# Patient Record
Sex: Male | Born: 1935
Health system: Southern US, Community
[De-identification: ages and names within clinical notes are randomized; demographics above are authoritative.]

## PROBLEM LIST (undated history)

## (undated) DIAGNOSIS — C801 Malignant (primary) neoplasm, unspecified: Secondary | ICD-10-CM

## (undated) DIAGNOSIS — E785 Hyperlipidemia, unspecified: Secondary | ICD-10-CM

## (undated) DIAGNOSIS — I10 Essential (primary) hypertension: Secondary | ICD-10-CM

## (undated) DIAGNOSIS — I219 Acute myocardial infarction, unspecified: Secondary | ICD-10-CM

## (undated) DIAGNOSIS — F419 Anxiety disorder, unspecified: Secondary | ICD-10-CM

## (undated) HISTORY — PX: CHOLECYSTECTOMY: SHX55

---

## 1978-10-07 HISTORY — PX: CARPAL TUNNEL RELEASE: SHX101

## 1997-02-05 DIAGNOSIS — I219 Acute myocardial infarction, unspecified: Secondary | ICD-10-CM

## 1997-02-05 HISTORY — PX: CORONARY ARTERY BYPASS GRAFT: SHX141

## 1997-02-05 HISTORY — DX: Acute myocardial infarction, unspecified: I21.9

## 1997-08-09 ENCOUNTER — Inpatient Hospital Stay (HOSPITAL_COMMUNITY): Admission: EM | Admit: 1997-08-09 | Discharge: 1997-08-18 | Payer: Self-pay | Admitting: Cardiology

## 1998-02-05 HISTORY — PX: JOINT REPLACEMENT: SHX530

## 1998-02-05 HISTORY — PX: HERNIA REPAIR: SHX51

## 2000-05-30 ENCOUNTER — Encounter: Payer: Self-pay | Admitting: Family Medicine

## 2000-05-30 ENCOUNTER — Ambulatory Visit (HOSPITAL_COMMUNITY): Admission: RE | Admit: 2000-05-30 | Discharge: 2000-05-30 | Payer: Self-pay | Admitting: Family Medicine

## 2001-09-19 ENCOUNTER — Encounter: Payer: Self-pay | Admitting: Family Medicine

## 2001-09-19 ENCOUNTER — Ambulatory Visit (HOSPITAL_COMMUNITY): Admission: RE | Admit: 2001-09-19 | Discharge: 2001-09-19 | Payer: Self-pay | Admitting: Family Medicine

## 2002-02-07 ENCOUNTER — Emergency Department (HOSPITAL_COMMUNITY): Admission: EM | Admit: 2002-02-07 | Discharge: 2002-02-07 | Payer: Self-pay | Admitting: Emergency Medicine

## 2002-02-10 ENCOUNTER — Encounter: Payer: Self-pay | Admitting: Urology

## 2002-02-10 ENCOUNTER — Ambulatory Visit (HOSPITAL_COMMUNITY): Admission: RE | Admit: 2002-02-10 | Discharge: 2002-02-10 | Payer: Self-pay | Admitting: Urology

## 2002-02-16 ENCOUNTER — Encounter: Payer: Self-pay | Admitting: Urology

## 2002-02-16 ENCOUNTER — Ambulatory Visit (HOSPITAL_COMMUNITY): Admission: RE | Admit: 2002-02-16 | Discharge: 2002-02-16 | Payer: Self-pay | Admitting: Urology

## 2002-06-19 ENCOUNTER — Ambulatory Visit (HOSPITAL_COMMUNITY): Admission: RE | Admit: 2002-06-19 | Discharge: 2002-06-19 | Payer: Self-pay | Admitting: General Surgery

## 2003-03-30 ENCOUNTER — Ambulatory Visit (HOSPITAL_COMMUNITY): Admission: RE | Admit: 2003-03-30 | Discharge: 2003-03-30 | Payer: Self-pay | Admitting: Urology

## 2003-03-30 ENCOUNTER — Encounter (INDEPENDENT_AMBULATORY_CARE_PROVIDER_SITE_OTHER): Payer: Self-pay | Admitting: *Deleted

## 2003-03-30 ENCOUNTER — Ambulatory Visit (HOSPITAL_BASED_OUTPATIENT_CLINIC_OR_DEPARTMENT_OTHER): Admission: RE | Admit: 2003-03-30 | Discharge: 2003-03-30 | Payer: Self-pay | Admitting: Urology

## 2004-06-02 ENCOUNTER — Ambulatory Visit (HOSPITAL_COMMUNITY): Admission: RE | Admit: 2004-06-02 | Discharge: 2004-06-02 | Payer: Self-pay | Admitting: Family Medicine

## 2004-11-21 ENCOUNTER — Ambulatory Visit: Payer: Self-pay | Admitting: Oncology

## 2007-07-04 ENCOUNTER — Encounter: Payer: Self-pay | Admitting: Pulmonary Disease

## 2007-07-04 ENCOUNTER — Inpatient Hospital Stay (HOSPITAL_COMMUNITY): Admission: EM | Admit: 2007-07-04 | Discharge: 2007-07-08 | Payer: Self-pay | Admitting: Emergency Medicine

## 2007-07-04 ENCOUNTER — Ambulatory Visit: Payer: Self-pay | Admitting: Cardiology

## 2007-07-07 ENCOUNTER — Encounter: Payer: Self-pay | Admitting: Cardiology

## 2007-07-07 ENCOUNTER — Ambulatory Visit: Payer: Self-pay | Admitting: *Deleted

## 2007-07-07 ENCOUNTER — Ambulatory Visit: Payer: Self-pay | Admitting: Cardiology

## 2007-07-08 ENCOUNTER — Other Ambulatory Visit: Payer: Self-pay | Admitting: Internal Medicine

## 2007-07-28 DIAGNOSIS — F528 Other sexual dysfunction not due to a substance or known physiological condition: Secondary | ICD-10-CM | POA: Insufficient documentation

## 2007-07-28 DIAGNOSIS — I2510 Atherosclerotic heart disease of native coronary artery without angina pectoris: Secondary | ICD-10-CM | POA: Insufficient documentation

## 2007-07-28 DIAGNOSIS — I10 Essential (primary) hypertension: Secondary | ICD-10-CM | POA: Insufficient documentation

## 2007-07-28 DIAGNOSIS — E785 Hyperlipidemia, unspecified: Secondary | ICD-10-CM | POA: Insufficient documentation

## 2007-07-28 DIAGNOSIS — F411 Generalized anxiety disorder: Secondary | ICD-10-CM | POA: Insufficient documentation

## 2007-07-29 ENCOUNTER — Ambulatory Visit: Payer: Self-pay | Admitting: Pulmonary Disease

## 2007-07-29 DIAGNOSIS — J439 Emphysema, unspecified: Secondary | ICD-10-CM | POA: Insufficient documentation

## 2007-08-20 ENCOUNTER — Telehealth: Payer: Self-pay | Admitting: Pulmonary Disease

## 2007-08-20 ENCOUNTER — Ambulatory Visit: Payer: Self-pay | Admitting: Pulmonary Disease

## 2008-02-20 ENCOUNTER — Ambulatory Visit: Payer: Self-pay | Admitting: Pulmonary Disease

## 2008-02-24 ENCOUNTER — Ambulatory Visit (HOSPITAL_COMMUNITY): Admission: RE | Admit: 2008-02-24 | Discharge: 2008-02-24 | Payer: Self-pay | Admitting: Pulmonary Disease

## 2008-09-24 ENCOUNTER — Ambulatory Visit (HOSPITAL_COMMUNITY): Admission: RE | Admit: 2008-09-24 | Discharge: 2008-09-24 | Payer: Self-pay | Admitting: Family Medicine

## 2008-09-24 ENCOUNTER — Encounter: Payer: Self-pay | Admitting: Pulmonary Disease

## 2009-04-22 ENCOUNTER — Ambulatory Visit (HOSPITAL_COMMUNITY): Admission: RE | Admit: 2009-04-22 | Discharge: 2009-04-22 | Payer: Self-pay | Admitting: Family Medicine

## 2010-02-26 ENCOUNTER — Encounter: Payer: Self-pay | Admitting: Family Medicine

## 2010-03-07 NOTE — Assessment & Plan Note (Signed)
Summary: consult for emphysema and congestion   PCP:  Dr. Rudi Heap  Chief Complaint:  Pulmonary consult.  Pt c/o sob with exertion and hoarseness.  Meds reviewed.  Pt recently in [facility] for sob.[name]  History of Present Illness: the patient is a 77 year old male who I have been asked to see for COPD and congestion.  Patient states that he has had chronic congestion for many years, and is currently using Mucinex in order to try and improve his situation.  He has an extensive smoking history, but has quit over the last one month.  He continues to use smokeless tobacco.  He has had recent catheterization for chest pain and dyspnea with no intervention being required.  The patient feels that mucus is getting stuck in his upper chest and he is unable to mobilize this.  He has been on Advair in the past which caused hoarseness.  He does not think he has postnasal drip, and denies reflux symptoms.  He does not have a chronic cough since being off cigarettes, and produces very little mucus.  He does have dyspnea on exertion at one block at a moderate pace, and will get short of breath bringing groceries in from the car.  The patient also has had a CT of his chest during his last hospitalization, and this showed both calcified and noncalcified nodules of unknown etiology.     Current Allergies: No known allergies   Past Medical History:    Current Problems:     HYPERLIPIDEMIA (ICD-272.4)    ERECTILE DYSFUNCTION (ICD-302.72)    ANXIETY (ICD-300.00)    HYPERTENSION (ICD-401.9)    CORONARY ARTERY DISEASE (ICD-414.00)-status post CABG 1999    history of myocardial infarction       Past Surgical History:    heart bypass==1999    Umbilical hernia repair    Right shoulder surgery in 1997?   Family History:    7 bothers and 4 sisters with heart disease.    Pancreatic cancer-- mother    Stomach cancer-- gather  Social History:    Retired Scientist, product/process development.    Patient states former smoker.  Pt quit x1  month.    Living Situation:  lives alone   Risk Factors:  Tobacco use:  quit    Year started:  age 33    Year quit:  2009    Pack-years:  67 years x2 ppd Alcohol use:  no   Review of Systems      See HPI   Vital Signs:  Patient Profile:   77 Years Old Male Weight:      145 pounds O2 Sat:      96 % O2 treatment:    Room Air Temp:     97.6 degrees F oral Pulse rate:   71 / minute BP sitting:   112 / 70  (left arm) Cuff size:   regular  Vitals Entered By: Michel Bickers CMA (July 29, 2007 11:56 AM)                 Physical Exam  General:     thin male in no acute distress Eyes:     PERRLA and EOMI.   Nose:     deviated septum to the left with nasal obstruction Mouth:     mild elongation of the soft palate and uvula Neck:     no JVD, thyromegaly, or lymphadenopathy. Lungs:     mildly decreased breath sounds, no wheezes or rhonchi Heart:  regular rate and rhythm, distant heart sounds Abdomen:     soft and nontender, bowel sounds present Extremities:     no significant edema, pulses intact distally Neurologic:     alert and oriented, moves all 4 extremities.     Impression & Recommendations:  Problem # 1:  COUGH (ICD-786.2) the cough and congestion the patient is describing sounds more upper airway and lower airway.  He is pointing to the center of his chest, but states that it is causing hoarseness.  He is also clearing his throat a lot during our visit.  It is unclear whether this is post nasal drip from allergic rhinitis, or possibly laryngopharyngeal reflux.  Will go ahead and treat this as postnasal drip, but will also intensify his bronchodilator regimen as well  Problem # 2:  EMPHYSEMA (ICD-492.8) I suspect the patient does have emphysema on clinical grounds.  I would like to start Spiriva, and order pulmonary function test for verification.  He will follow-up with me the same day as his PFTs.  Problem # 3:  PULMONARY NODULE (ICD-518.89) the  patient was found to have calcified and noncalcified nodules by chest CT recently while in the hospital.  Because of his extensive smoking history, the noncalcified nodules will need follow-up.  I would recommend a follow-up scan in 6 months.  Medications Added to Medication List This Visit: 1)  Adult Aspirin Ec Low Strength 81 Mg Tbec (Aspirin) .[name].. 1 by mouth daily 2)  Lipitor 10 Mg Tabs (Atorvastatin calcium) .[name].. 1 by mouth daily 3)  Cardura 2 Mg Tabs (Doxazosin mesylate) .[name].. 1 by mouth daily 4)  Klonopin 1 Mg Tabs (Clonazepam) .[name].. 1 by mouth three times a day   Patient Instructions: 1)  start spiriva one each am 2)  use albuterol inhaler (red one) only if an emergency 3)  get zyrtec otc and take one at bedtime 4)  see me in 3 weeks, and will have breathing tests same day   ]

## 2010-03-07 NOTE — Miscellaneous (Signed)
Summary: Orders Update pft charges  Clinical Lists Changes  Orders: Added new Service order of Carbon Monoxide diffusing w/capacity (94720) - Signed Added new Service order of Lung Volumes (94240) - Signed Added new Service order of Spirometry (Pre & Post) (94060) - Signed 

## 2010-03-07 NOTE — Progress Notes (Signed)
Summary: rx  Phone Note Call from Patient Call back at Home Phone [phone]   Caller: Patient Call For: Darrah Dredge Reason for Call: Refill Medication, Talk to Nurse Summary of Call: Spiriva - Forgot to get samples or rx...needs rx called in [facility] Initial call taken by: Eugene Gavia,  August 20, 2007 3:11 PM  Follow-up for Phone Call        called and spoke with pt.  he is aware that the rx for spiriva has been sent to his pharmacy Follow-up by: Marijo File CMA,  August 20, 2007 3:17 PM    New/Updated Medications: SPIRIVA HANDIHALER 18 MCG  CAPS (TIOTROPIUM BROMIDE MONOHYDRATE) once daily   Prescriptions: SPIRIVA HANDIHALER 18 MCG  CAPS (TIOTROPIUM BROMIDE MONOHYDRATE) once daily  #1 x 6   Entered by:   Marijo File CMA   Authorized by:   Barbaraann Share MD   Signed by:   Marijo File CMA on 08/20/2007   Method used:   Electronically sent to ...       [id number]       [address] [place], [place]  [address]       Ph: [phone] or [phone]       Fax: [phone]   RxID:   [id number]

## 2010-03-07 NOTE — Assessment & Plan Note (Signed)
Summary: rov for emphysema   Visit Type:  Follow-up PCP:  Dr. Rudi Heap  Chief Complaint:  3 wk followup with PFT's.  He states his breathing has improved some since starting spiriva.  He denies any complaints today.[name]  History of Present Illness: the patient comes in today for followup after his recent PFTs, and also to assess his progress on Spiriva. The patient states that his breathing has improved somewhat since being on the Spiriva. His pulmonary function studies today shows moderate to severe airflow obstruction, with no improvement with bronchodilators. There was no restriction, and his diffusion capacity appeared normal. I have gone over the studies in great detail with the patient, and have answered all of his questions. At the last visit, the patient was complaining of a cough with tickling in his throat. Since being off the Advair, this has resolved     Current Allergies: No known allergies     Risk Factors:  Tobacco use:  quit    Year quit:  2009    Pack-years:  65 years x2 ppd    Comments:  Pt uses smokeless tobacco daily    Vital Signs:  Patient Profile:   77 Years Old Male Height:     67 inches Weight:      146 pounds O2 Sat:      95 % O2 treatment:    Room Air Temp:     97.7 degrees F oral Pulse rate:   86 / minute BP sitting:   128 / 68  (left arm)  Vitals Entered By: Vernie Murders (August 20, 2007 1:25 PM)                 Physical Exam  General:     thin male in no acute distress   Pulmonary Function Test Date: 08/20/2007 Height (in.): 67 Gender: Male  Pre-Spirometry FVC    Value: 3.56 L/min   Pred: 3.75 L/min     % Pred: 95 % FEV1    Value: 1.66 L     Pred: 2.44 L     % Pred: 68 % FEV1/FVC  Value: 47 %     Pred: 68 %     % Pred: - % FEF 25-75  Value: 0.38 L/min   Pred: 2.20 L/min     % Pred: 17 %  Post-Spirometry FVC    Value: 3.47 L/min   Pred: 3.75 L/min     % Pred: 93 % FEV1    Value: 1.74 L     Pred: 2.44 L     % Pred: 71 %  FEV1/FVC  Value: 50 %     Pred: 68 %     % Pred: - % FEF 25-75  Value: 0.48 L/min   Pred: 2.20 L/min     % Pred: 22 %  Lung Volumes TLC    Value: 6.01 L   % Pred: 106 % RV    Value: 2.46 L   % Pred: 100 % DLCO    Value: 15.6 %   % Pred: 98 % DLCO/VA  Value: 3.21 %   % Pred: 95 %     Impression & Recommendations:  Problem # 1:  EMPHYSEMA (ICD-492.8) The pt has moderate to severe emphysema on his pft's.  He is actually functioning at a much higher level than his pft's indicate.  He is tolerating the spiriva well, with no hoarseness issues.I would not intensify his therapy since he seems to be doing okay. If  his condition worsens, we can always add symbicort in the future.  Problem # 2:  COUGH (ICD-786.2) I suspect a lot of his cough was due to to upper airway irritation associated with the Advair discus. The patient states it has resolved.  Problem # 3:  PULMONARY NODULE (ICD-518.89) Will schedule a followup CT scan for surveillance at the next visit in December  Complete Medication List: 1)  Adult Aspirin Ec Low Strength 81 Mg Tbec (Aspirin) .[name].. 1 by mouth daily 2)  Lipitor 10 Mg Tabs (Atorvastatin calcium) .[name].. 1 by mouth daily 3)  Cardura 2 Mg Tabs (Doxazosin mesylate) .[name].. 1 by mouth daily 4)  Klonopin 1 Mg Tabs (Clonazepam) .[name].. 1 by mouth three times a day 5)  Spiriva Handihaler 18 Mcg Caps (Tiotropium bromide monohydrate) .... Once daily 6)  Proair Hfa 108 (90 Base) Mcg/act Aers (Albuterol sulfate) .... 2 puffs every 4 hours as needed   Patient Instructions: 1)  Please schedule a follow-up appointment in 6 months. 2)  no change in meds.   ]

## 2010-06-20 NOTE — Group Therapy Note (Signed)
NAME:  Steven [name], MANCERA                 ACCOUNT NO.:  [id number]   MEDICAL RECORD NO.:  [id number]          PATIENT TYPE:  INP   LOCATION:  A316                          FACILITY:  [facility]   PHYSICIAN:  Dorris Singh, DO    DATE OF BIRTH:  1928/07/24   DATE OF PROCEDURE:  DATE OF DISCHARGE:                                 PROGRESS NOTE   The patient is seen today resting in bed.  States that he feels a lot  better.  His breathing is improved.  Will go ahead and continue to  monitor him.   His temperature is 97.8, pulse is 87, respirations 22, blood pressure  100/45.  GENERAL:  The patient is a 77 year old man who looks older than his  stated age but is in no acute distress.  HEART:  Regular rate and rhythm.  LUNGS:  Minimal movement of air, decreased breath sounds throughout lung  fields.  ABDOMEN:  Soft, nontender, nondistended.  EXTREMITIES:  Positive pulses.  No edema, ecchymosis or cyanosis.   LABS:  Today his white count was 11.9, hemoglobin of 12.1, hematocrit of  34.3, and a platelet count of 153.  His sodium was 134, potassium 3.8,  chloride 101, CO2 23, glucose 161, BUN 21, creatinine 1.52.   ASSESSMENT/PLAN:  1. Acute exacerbation of chronic obstructive pulmonary disease.  Will      go ahead and continue with the current treatment therapy and      continue to monitor the patient.  2. Increasing creatinine.  Will go ahead and start the patient on      cautious IV hydration.  3. Hyponatremia.  Will also use IV hydration to correct that as well.  4. The patient also has elevated troponin.  Will get an EKG to see if      there are any changes and will continue to monitor his troponins.      Currently the patient is not complaining of any chest pain,      shortness of breath or diaphoresis.  He has a heart historyof a      coronary artery bypass graft in the past, so we will go ahead and      continue to monitor his troponins and do appropriate therapy and      change  management if necessary.      Dorris Singh, DO  Electronically Signed     CB/MEDQ  D:  07/05/2007  T:  07/05/2007  Job:  [id number]

## 2010-06-20 NOTE — Group Therapy Note (Signed)
NAME:  Steven [name], Steven [name]                 ACCOUNT NO.:  [id number]   MEDICAL RECORD NO.:  [id number]          PATIENT TYPE:  INP   LOCATION:  A316                          FACILITY:  [facility]   PHYSICIAN:  Lucita Ferrara, MD         DATE OF BIRTH:  [birth year]   DATE OF PROCEDURE:  07/06/2007  DATE OF DISCHARGE:                                 PROGRESS NOTE   The patient examined by bedside today.  He is comfortable, denies any  chest pain, shortness of breath.  He was admitted on Jul 04, 2007,  presenting with symptoms of cough and shortness of breath.  He was  admitted with acute COPD exacerbation, started on nebulizer treatments  and empirically started on Solu-Medrol and Levaquin, symptoms are much  improved, he is still quite a bit anxious.   OBJECTIVE:  Temperature 97.3, pulse 88, respirations 20, blood pressure  96/54.  HEENT:  Normocephalic, atraumatic.  CARDIOVASCULAR:  S1-S2 regular rate, rhythm.  ABDOMEN:  Soft, nontender, nondistended, positive bowel sounds.  LUNGS:  Diffuse expiratory wheezes moderate.  EXTREMITIES:  No clubbing, cyanosis or edema.  NEURO:  Patient is anxious yet alert and oriented x3.   LABORATORY DATA:  Urine culture negative;  cardiac enzymes troponin 0.7,  0.08, 0.09.  Urine microscopy shows trace leukocyte esterase.  Blood  gases done on the 30th shows pH of 7.398/pCO2 of 36.7.  His EKG shows  him to have nonspecific ST-T wave abnormalities.   ASSESSMENT/PLAN:  1. Acute chronic obstructive pulmonary disease exacerbation, currently      on nebulizer treatments, Solu-Medrol and Levaquin as needed, seems      to be controlled.  Will continue to monitor and clinically titrate      him down.  2. Admitted with hyponatremia which is resolved so far with IV      hydration.  3. Mildly elevated troponin.  His EKG shows nonspecific changes.  He      is not complaining of any chest pain.  4. History of coronary artery bypass graft.  Will monitor his      troponins  currently and if they trend up will go ahead and consult      cardiology, continue risk factor modification, currently he is on      aspirin which we will continue.  He is on simvastatin, deep vein      thrombosis prophylaxis with Lovenox, GI prophylaxis with Protonix.  5. Diabetes type 2.  Continue his sliding scale insulin.  6. Anxiety.  Currently on clonazepam.  He is on clonazepam, continue      current dose.      Lucita Ferrara, MD  Electronically Signed    RR/MEDQ  D:  07/06/2007  T:  07/06/2007  Job:  [id number]

## 2010-06-20 NOTE — Consult Note (Signed)
NAME:  Steven [name], Steven [name]                 ACCOUNT NO.:  [id number]   MEDICAL RECORD NO.:  [id number]          PATIENT TYPE:  INP   LOCATION:  A316                          FACILITY:  [facility]   PHYSICIAN:  Gerrit Friends. Dietrich Pates, MD, FACCDATE OF BIRTH:  [birth year]   DATE OF CONSULTATION:  07/07/2007  DATE OF DISCHARGE:                                 CONSULTATION   CARDIOLOGIST:  He is new to Dr. Dietrich Pates, he has previously been  followed by Dr. Antoine Poche in [place], with his last visit being in 2002.   PRIMARY CARE Jeslyn Amsler:  Denyce Robert. Long, PA-C with Ernestina Penna, MD   REFERRING PHYSICIAN:  Lucita Ferrara, MD of [facility] Hospitalist team P.   REASON FOR REFERRAL:  Dyspnea and elevated troponins.   HISTORY OF PRESENT ILLNESS:  Mr. Steven [name] is a 77 year old male patient  with a history of CAD, status post CABG by Dr. Cornelius Moras in 1999, after  suffering an apparent myocardial infarction.  He was last seen by Dr.  Antoine Poche in the [place] office in 2002.  There was a question of an  abnormal treadmill stress study.  Dr. Antoine Poche reviewed this, and felt  that the results were not abnormal.  He was continued on medical  therapy.  I have no records that I have seen this patient since that  time.  He was apparently in his usual state of health until Friday  morning, Jul 04, 2007, at around 4 o'clock.  At that time, he developed  an acute onset of dyspnea.  He had no associated chest discomfort, arm  or jaw pain, nausea, or diaphoresis.  He did not feel presyncopal or  have syncope.  He denies any recent history of worsening dyspnea with  exertion.  He does have baseline dyspnea with exertion secondary to his  COPD.  He describes NYHA class II symptoms.  He denies orthopnea, PND,  or pedal edema.  Since admission, he has been treated for COPD  exacerbation.  He notes hoarseness with breathing treatments.  Other  than that, he denies any recurrent shortness of breath.  His CK-MBs and  troponins have been  minimally elevated and we were asked to further  evaluate.   PAST MEDICAL HISTORY:  1. CAD, status post CABG in 1999.      a.     Grafts:  LIMA to the LAD, vein graft to the PDA, vein graft       to the distal RCA and PDA, vein graft to circumflex, and vein       graft to the first diagonal.  2. Hypertension.  3. Hyperlipidemia.  4. COPD.  5. BPH.  6. History of carotid stenosis.      a.     Apparently, evaluated by vascular surgery in the past.  7. Anxiety.  8. Status post cholecystectomy.  9. Status post umbilical herniorrhaphy.  10.Status post right rotator cuff surgery.  11.Status post bilateral carpal tunnel release.   MEDICATIONS PRIOR TO ADMISSION:  1. Lipitor 10 mg daily.  2. Cardura 2 mg daily.  3. Klonopin 1 mg three  times a day.  4. Aspirin 81 mg daily.   ALLERGIES:  No known drug allergies.   SOCIAL HISTORY:  The patient lives in [place] by himself.  He is retired  from [employer].  He has a 65 plus pack year history of smoking and  continues to smoke a half pack of cigarettes per day.  He denies alcohol  abuse.   FAMILY HISTORY:  Significant for premature CAD in multiple family  members.   REVIEW OF SYSTEMS:  Please see HPI.  He denies any fevers, chills,  headaches, or cough.  He denies dysuria, hematuria, bright red blood per  rectum, or melena.  Denies any monocular blindness, unilateral weakness,  difficulty in speech or facial droop.  He does note a tired feeling in  his legs with walking.  Rest of the review of systems are negative.   PHYSICAL EXAMINATION:  He is a well-nourished and well-developed male,  in no distress.  Blood pressure 125/61, pulse 79, respirations 20, temperature 97.6, and  oxygen saturation 96% on 2 L.  HEENT:  Normal.  NECK:  Without JVD without lymphadenopathy.  ENDOCRINE:  Without thyromegaly.  CARDIAC:  Normal S1 and S2.  Regular rate and rhythm with a 2/6 systolic  ejection murmur heard best at right upper sternal border.   LUNGS:  With  decreased breath sounds bilaterally.  Faint bibasilar crackles.  SKIN:  Without rash.  ABDOMEN:  Soft and nontender with normoactive bowel sounds.  No  organomegaly.  EXTREMITIES:  Without clubbing, cyanosis, or edema.  MUSCULOSKELETAL:  Without joint deformity.  NEUROLOGIC:  He is alert and oriented x3.  Cranial nerves II through XII  are grossly intact.  VASCULAR:  Positive bilateral carotid bruits noted.  Femoral artery  pulses are 2+ bilaterally with positive bruits bilaterally.  His  dorsalis pedis and posterior tibialis pulses are difficult to palpate  bilaterally.   Chest x-ray reveals probable COPD, prior CABG, no acute changes.  Chest  CT revealed no pulmonary embolism, calcified and noncalcified pulmonary  nodules-question granulomatous disease.  Followup recommended in 6  months.  EKG reveals normal sinus rhythm, heart rate is 78, Q-waves in  II, III, and aVF, and T-wave inversions in II, III, and aVF and V4  through V6, T-wave inversions seen to be new since prior tracing dated  Jul 05, 2007.   LABORATORY DATA:  White count 9700, hemoglobin 11.2, hematocrit 32.5,  and platelet count 147,000.  Sodium 142, potassium 4.6, BUN 24,  creatinine 1.24, which is down from 1.52, and glucose 139.  D-dimer 1.6,  BNP 252, albumin 2.7, and INR 1.0.  Cardiac enzymes as follows:  CK #1  is 67, #2 is 102, #3 is 251, and #4 is 221.  CK-MB #1 is 3, #2 is 4.8,  #3 is 8.4, and #4 is 7.7.  Troponin I #1 is 0.04, #2 is 0.07, #3 is  0.08, and #4 is 0.07.   IMPRESSION:  1. Acute dyspnea.      a.     Rule out non-ST elevation myocardial infarction.      b.     Rule out chronic obstructive pulmonary disease exacerbation.  2. Coronary artery disease, status post coronary artery bypass graft.  3. Bilateral carotid artery bruits.  4. Bilateral lower extremity claudication with evidence of peripheral      arterial disease on exam.  5. Systolic murmur.  6. Mild renal  insufficiency.   PLAN:  The patient presents with acute onset of  dyspnea.  This is unlike  his anxiety attacks.  It is also unlike his prior angina.  He denies any  purulent cough.  His EKG has T-wave inversions in inferolateral leads.  His cardiac enzymes have been minimally elevated.  Given his abnormal  findings, he may ultimately require cardiac catheterization.  At this  point in time, we will check an echocardiogram to assess his LV  function, wall motion, and valvular status.  Carotid Dopplers will also  be checked as well as ABIs.  His Lovenox, aspirin, and simvastatin will  be continued.  Lopressor will be initiated, and his Cardura will be  discontinued.   Thank you very much for the consultation.  We will be glad to follow up  the patient throughout this admission.      Tereso Newcomer, PA-C      Gerrit Friends. Dietrich Pates, MD, [facility]  Electronically Signed    SW/MEDQ  D:  07/07/2007  T:  07/07/2007  Job:  [id number]   cc:   Lorin Picket D. Long, PA-C   Ernestina Penna, M.D.  Fax: [phone]   Lucita Ferrara, MD

## 2010-06-20 NOTE — Group Therapy Note (Signed)
NAME:  Steven [name], Steven [name]                 ACCOUNT NO.:  [id number]   MEDICAL RECORD NO.:  [id number]          PATIENT TYPE:  INP   LOCATION:  A316                          FACILITY:  [facility]   PHYSICIAN:  Margaretmary Dys, M.D.DATE OF BIRTH:  [birth year]   DATE OF PROCEDURE:  07/07/2007  DATE OF DISCHARGE:                                 PROGRESS NOTE   SUBJECTIVE:  When I arrived to see the patient today, I was informed  that he was having nonsustained ventricular tachycardia.  It was also  noted that his cardiac enzymes were borderline elevated.  As a result,  the patient was seen by Dr. Dietrich Pates, the cardiologist who recommended  transfer to [facility] for cardiac catheterization.  The  patient was subsequently transferred to [facility] in a stable  condition.      Margaretmary Dys, M.D.  Electronically Signed     AM/MEDQ  D:  07/07/2007  T:  07/07/2007  Job:  [id number]

## 2010-06-20 NOTE — Discharge Summary (Signed)
NAME:  Steven [name], Steven [name]                 ACCOUNT NO.:  [id number]   MEDICAL RECORD NO.:  [id number]          PATIENT TYPE:  INP   LOCATION:  3735                         FACILITY:  [facility]   PHYSICIAN:  Everardo Beals. Juanda Chance, MD, FACCDATE OF BIRTH:  02-11-1928   DATE OF ADMISSION:  07/07/2007  DATE OF DISCHARGE:  07/08/2007                               DISCHARGE SUMMARY   PRIMARY CARDIOLOGIST:  Dr. Rollene Rotunda in [place].   PRIMARY CARE Clara Herbison:  Lindaann Pascal, physician assistant.   PROCEDURES PERFORMED DURING HOSPITALIZATION:  Cardiac catheterization  completed by Dr. Charlies Constable on July 08, 2007, revealing a total LAD,  total circumflexafter OM, 90% OM, right coronary artery 90% mid with  total distal;  SVG to OM patent, SVG to diagonal patent, SVG to PDA and  PL patent, LIMA to LAD patent, LV systolic function 60%.   FINAL DISCHARGE DIAGNOSES:  1. Coronary artery disease.      a.     Status post coronary artery bypass grafting.  2. Chronic obstructive pulmonary disease.  3. History of MI.  4. History of panic attacks.  5. Ongoing tobacco abuse.   HOSPITAL COURSE:  A 77 year old Caucasian male who was transferred from  [facility] with complaints of shortness of breath and chest  discomfort.  The patient was admitted and had cardiac catheterization  completed, catheterization as described as above and having patent  grafts.  The patient continues to smoke.  The patient was given  reassurance during hospitalization and his coronary anatomy was healthy  concerning his grafts.  He has been advised strongly to quit smoking.  He has also been advised to follow with primary care and have followup  with a pulmonologist concerning his COPD.   LABS ON DISCHARGE:  Sodium 140, potassium 4.3, chloride 105, CO2 28,  glucose 135, BUN 26, creatinine 1.25, cholesterol 154, triglycerides 83,  HDL 65, LDL 72, hemoglobin 12.6, hematocrit 37.0, white blood cells 8.4,  platelets 168.  ABG, pH  7.413, pCO2 47.1, pCO2 56.0, bicarbonate 30, O2  sat 89%.  CT of the chest completed at [facility] revealing status post  CABG, aortic normal caliber without aneurysm or dissection.  No evidence  of pulmonary embolism or acute thoracic process.   DISCHARGE VITAL SIGNS:  Blood pressure 120/65, heart rate 74,  respirations 20, and temperature 97.7.   DISCHARGE MEDICATIONS:  1. Lipitor 10 mg at bedtime.  2. Cardura 2 mg daily.  3. Aspirin 81 mg daily.  4. Klonopin 1 mg 3 times a day.  5. Combivent inhalers 4 times a day.  6. Nitroglycerin sublingual p.r.n. chest discomfort.   ALLERGIES:  NO KNOWN DRUG ALLERGIES.   FOLLOWUP PLANS AND APPOINTMENT:  1. The patient is to follow with Lindaann Pascal, physician assistant, for      continued outpatient medical management.  2. It is recommended that the patient will be followed by      pulmonologist secondary to ongoing COPD and tobacco abuse.  3. The patient is to follow up with Dr. Antoine Poche in [place] office for  continued cardiology management within the next 6 months.  4. The patient is strongly urged to quit smoking.   TIME SPENT WITH THE PATIENT TO INCLUDE PHYSICIAN TIME:  30 minutes.      Bettey Mare. Lyman Bishop, NP      Everardo Beals. Juanda Chance, MD, [facility]  Electronically Signed    KML/MEDQ  D:  07/08/2007  T:  07/09/2007  Job:  [id number]   cc:   Lindaann Pascal, PA-C

## 2010-06-20 NOTE — H&P (Signed)
NAME:  Steven [name], NASTASI NO.:  [id number]   MEDICAL RECORD NO.:  [id number]          PATIENT TYPE:  EMS   LOCATION:  ED                            FACILITY:  [facility]   PHYSICIAN:  Dorris Singh, DO    DATE OF BIRTH:  06-Jul-1928   DATE OF ADMISSION:  07/04/2007  DATE OF DISCHARGE:  LH                              HISTORY & PHYSICAL   The patient is a 77 year old Caucasian male who presented to the [facility] emergency room with a complaint of shortness of breath.  He states  that for the last couple days, he has noticed it has gotten  progressively worse.  He has been treating himself for was has felt like  hoarseness over the last couple days without any relief.  He does not  admit to having any cough, but just states that his breathing has  deteriorated.  Also, the patient smokes about half-a-pack admitted and  has been smoking for about 65 years and is still smoking with COPD.   PAST MEDICAL HISTORY:  1. Significant for COPD.  2. Hernia repair.  3. Right shoulder repair.  4. Myocardial infarction.  5. History of panic attacks.   FAMILY HISTORY:  Significant for hypertension.   PAST SURGICAL HISTORY:  The patient admits to several surgeries:  1. He has had a cholecystectomy.  2. Right shoulder surgery.  3. CABG.  4. Bilateral carpal tunnel release.   SOCIAL HISTORY:  Currently, he is a widow.  He lives alone.  He does not  drink.  He does not use any drugs, but smokes about half-pack a day.   ALLERGIES:  HE HAS NO KNOWN DRUG ALLERGIES.   CURRENT MEDICATIONS:  We have several medications without doses;  Lipitor, Cardura, aspirin, Klonopin and Combivent inhalers.   PRIMARY CARE PHYSICIAN:  Dr. Rudi Heap, [place].   REVIEW OF SYSTEMS:  CONSTITUTIONAL:  Negative for weight loss, but  positive for weakness.  EYES:  Negative for eye pain or changes in  vision.  EARS/NOSE/MOUTH/THROAT:  Negative for ear pain or hearing loss.  Positive for hoarseness.   Negative for sinus pressure.  Positive for  sore throat.  CARDIOVASCULAR:  Negative for chest pain, palpitations or  bradycardia.  RESPIRATORY:  Positive for dyspnea and wheezing.  Negative  for cough.  GASTROINTESTINAL:  Negative for nausea, vomiting, diarrhea  or abdominal pain.  GU:  Negative for dysuria, urgency or dribbling.  MUSCULOSKELETAL:  Negative for arthralgias, arthritis or back pain.  SKIN:  Negative for pruritus or rash.  NEURO:  Negative for headache,  confusion or altered mental status.  METABOLIC:  Negative for excessive  thirst, heat or cold intolerance.  HEMATOLOGIC:  Negative for anemia.   PHYSICAL EXAMINATION:  VITAL SIGNS:  Blood pressure is 114/63, pulse  rate 119.  His temperature is 98.8.  He is currently on pulse oximeter  at 100% on 2 liters.  CONSTITUTIONAL:  The patient is a 77 year old male who is well-  developed, well-nourished, well-hydrated, answers questions  appropriately.  HEENT:  Head is normocephalic, atraumatic.  Eyes are  equal and reactive  to light bilaterally.  There is no scleral icterus or conjunctival  injection.  PMIs are visualized bilaterally.  Nose, turbinates are moist  with nasal cannula.  Throat, teeth are in poor repair.  There is no  erythema or exudate noted.  NECK:  Supple.  No thyromegaly noted.  There is no carotid bruits  either.  CARDIOVASCULAR:  Regular tachy sinus.  No murmur noted.  RESPIRATORY:  Decreased breath sounds, prolonged expirations.  There is  some wheezing throughout bilateral lung fields.  CHEST:  Symmetrical.  There are no retractions noted.  ABDOMEN:  Soft, nontender, nondistended.  No guarding, no rebound or  tenderness.  Bowel sounds are present in all four quadrants.  EXTREMITIES:  Positive pulses.  No ecchymosis, edema or cyanosis noted.  Full range of motion.  NEURO:  Cranial nerves II-XII grossly intact.  The patient has intact motor capabilities in all four extremities.  He  is A&O x3 and  appropriate.  SKIN:  Has good texture.  No rash noted.   The patient had an EKG done.  His heart rate was 83 beats per minute,  normal sinus.  Everything is normal limits, which was unchanged from his  last EKG in March 26, 2003.  His BNP was 35.  His chest x-ray showed  probable COPD, prior CABG, no acute thoracic findings.  First set  cardiac enzymes were negative.  Sodium was 139, potassium 3.8, chloride  105, carbon dioxide 28, glucose 152, BUN 14, creatinine 1.12.  D-dimer  of 1.60.  White count of 6.8, hemoglobin of 13.4, hematocrit 38.5 and  platelet count of 158.  His ABG: pH of 7.379, pCO2 of 44.9, pCO2 of  69.6, bicarb 25.9.  His CT of his chest demonstrated no evidence of  pulmonary embolus or acute thoracic process.  Combination calcified and  noncalcified pulmonary nodule may represent prior granulomatous disease  due to noncalcified pulmonary nodule.  Follow-up CT is recommended in 6  months to assess stability.   PLAN:  1. Will admit the patient to the service of [facility] to 3A with      telemetry.  Place him on a heart-healthy diet.  Will get labs,      including cardiac enzymes as well.  Will place him on Solu-Medrol      IV, DuoNeb q.4 h. with p.r.n., one hour p.r.n.  Place him on DVT      and GI prophylaxis.  Will put the patient on his home medication      once dosages are obtained.  Smoking education.  Will continue to      monitor and change therapy as necessary.      Dorris Singh, DO  Electronically Signed     CB/MEDQ  D:  07/04/2007  T:  07/04/2007  Job:  [id number]

## 2010-06-23 NOTE — H&P (Signed)
NAME:  CALYB, MCQUARRIE NO.:  [id number]   MEDICAL RECORD NO.:  [id number]                  PATIENT TYPE:   LOCATION:                                       FACILITY:   PHYSICIAN:  Dalia Heading, M.D.               DATE OF BIRTH:  03/13/1928   DATE OF ADMISSION:  06/19/2002  DATE OF DISCHARGE:                                HISTORY & PHYSICAL   CHIEF COMPLAINT:  Incisional hernia.   HISTORY OF PRESENT ILLNESS:  The patient is a 77 year old white male, status  post a laparoscopic cholecystectomy for gallstone pancreatitis in 2000, who  now presents with worsening umbilical hernia.  He states he has had it in  the past, though it has increased in size recently.  It is around a surgical  site in the periumbilical region.  No nausea or vomiting had been noted.  Increasing tenderness is noted.   PAST MEDICAL HISTORY:  1. Coronary artery disease.  2. Hypertension.  3. Anxiety disorder.  4. High cholesterol levels.   PAST SURGICAL HISTORY:  1. As noted above.  2. Hand surgery x 2.  3. CABG.  4. Shoulder surgery.   CURRENT MEDICATIONS:  1. Aspirin, which he is holding.  2. Cardura 2 mg p.o. daily.  3. Lipitor.  4. Paxil.  5. Klonopin on occasion.   ALLERGIES:  No known drug allergies.   REVIEW OF SYSTEMS:  The patient continues to smoke daily.  Denies any recent  chest pain, MI, CVA, or diabetes mellitus.  Denies any recent bleeding  disorders.   PHYSICAL EXAMINATION:  GENERAL:  Well-developed, well-nourished white male  in no acute distress.  VITAL SIGNS:  Afebrile, and vital signs are stable.  LUNGS:  Clear to auscultation with equal breath sounds bilaterally.  HEART:  Regular rate and rhythm.  Without S3, S4, or murmurs.  ABDOMEN:  Soft and nondistended.  It is tender over the periumbilical hernia  which is somewhat reducible.  Surgical scars are present.  No  hepatosplenomegaly or masses are noted.  RECTAL:  Deferred until the  procedure.   IMPRESSION:  Incisional hernia.   PLAN:  The patient is scheduled for an incisional herniorrhaphy on Jun 19, 2002.  The risks and benefits of the procedure including bleeding,  infection, and the possibility of recurrence of the hernia were fully  explained to the patient, who gave informed consent.                                               Dalia Heading, M.D.    MAJ/MEDQ  D:  06/16/2002  T:  06/16/2002  Job:  [id number]   cc:   Gaynelle Cage, MD  [phone] [address]  [place]  [place]  [address]  Fax: [phone]

## 2010-06-23 NOTE — Op Note (Signed)
   NAME:  Steven [name], Steven [name]                           ACCOUNT NO.:  [id number]   MEDICAL RECORD NO.:  [id number]                   PATIENT TYPE:  AMB   LOCATION:  DAY                                  FACILITY:  [facility]   PHYSICIAN:  Dalia Heading, M.D.               DATE OF BIRTH:  [birth year]   DATE OF PROCEDURE:  06/19/2002  DATE OF DISCHARGE:                                 OPERATIVE REPORT   PREOPERATIVE DIAGNOSIS:  Incisional hernia.   POSTOPERATIVE DIAGNOSIS:  Incisional hernia.   PROCEDURE:  Incisional herniorrhaphy.   SURGEON:  Dalia Heading, M.D.   ANESTHESIA:  General endotracheal.   INDICATIONS FOR PROCEDURE:  The patient is a 77 year old white male status  post laparoscopic cholecystectomy for gallstone pancreatitis in 2000 who now  presents with an incisional hernia at the umbilical site. The risks and  benefits of the procedure including bleeding, infection, and recurrence of  the hernia were fully explained to the patient, who gave informed consent.   DESCRIPTION OF PROCEDURE:  The patient was placed in the supine position.  After induction of general endotracheal anesthesia, the abdomen was prepped  and draped using the usual sterile technique with Betadine. Surgical site  confirmation was performed.   An infraumbilical incision was made down to the fascia. The umbilicus was  freed away from the underlying fascia. A small hernia defect was noted. This  was reduced and the fascial edges were reapproximated using an #0 Ethibond  interrupted suture. The base of the umbilicus was secured back to the fascia  using a 2-0 Vicryl interrupted suture. The subcutaneous layer was  reapproximated using a 3-0 Vicryl interrupted suture. The skin was closed  using staples. Betadine ointment and dry sterile dressings were applied.  0.5% Sensorcaine was instilled into the surrounding wound.   All tape and needle counts were correct at the end of the procedure. The  patient was  extubated in the operating room and went back to the recovery  room awake in stable condition.   COMPLICATIONS:  None.    SPECIMENS:  None.   ESTIMATED BLOOD LOSS:  Minimal.                                               Dalia Heading, M.D.    MAJ/MEDQ  D:  06/19/2002  T:  06/19/2002  Job:  [id number]   cc:   Gaynelle Cage, MD  [phone] [address]  [place]  [place] [address]  Fax: [phone]

## 2010-06-23 NOTE — Consult Note (Signed)
NAME:  Steven [name], Steven [name]                             ACCOUNT NO.:  [id number]   MEDICAL RECORD NO.:  [id number]                   PATIENT TYPE:  AMB   LOCATION:                                       FACILITY:  [facility]   PHYSICIAN:  R. Roetta Sessions, M.D.              DATE OF BIRTH:  [birth year]   DATE OF CONSULTATION:  04/29/2002  DATE OF DISCHARGE:                                   CONSULTATION   REQUESTING PHYSICIAN:  Dr. Reola Calkins   REASON FOR CONSULTATION:  Mild iron-deficiency anemia.   HISTORY OF PRESENT ILLNESS:  This patient is a 77 year old Caucasian male,  patient of Dr. Reola Calkins who presents today for further evaluation of the above-  stated symptoms.  In February 2004 he was found to have a hemoglobin of  13.1, hematocrit 41.3, MCV 95.2.  In March he had repeat CBC which revealed  that his hemoglobin was up to 13.2, hematocrit 39.3, MCV was 93.  He had  anemia profile which revealed iron of 39.  Iron saturation was 11%.  Ferritin was 17, B12 was 675, folate was greater than 24.  Stool Hemoccult  cards were negative x3.  Also he should have further GI workup for mild iron-  deficiency anemia.  The patient states that he has been doing fairly well.  His bowels have been moving regularly.  He denies any melena or rectal  bleeding.  He denies any abdominal pain, nausea or vomiting, or heartburn.  He states that in January he had a time with hematuria. He was passing blood  clots and then had urinary obstruction. He had to wear a catheter for  several weeks and passed blood into his catheter during this time. This has  now resolved.  He did have a cystoscopy by Dr. Jerre Simon.  The patient reports  that this was normal.   CURRENT MEDICATIONS:  1. Cardura 2 mg daily.  2. Clonazepam 1 mg b.i.d.  3. Bayer aspirin 81 mg daily.  4. Lipitor 10 mg daily.  5. Paxil CR 12.5 mg daily.   ALLERGIES:  Plain aspirin.   PAST MEDICAL HISTORY:  1. Hyperlipidemia.  2. Hypertension.  3. Coronary artery  disease status post 5-vessel bypass in 1999.  4. Right subclavian and carotid stenosis.  5. Anxiety.  6. Benign prostatic hypertrophy, recent hematuria.  7. He also has had a cholecystectomy for cholelithiasis.  8. A right rotator cuff repair.  9. Bilateral carpal tunnel surgery.   FAMILY HISTORY:  Mother died of pancreatic cancer.  Father died of ruptured  aneurysm.  He had a sister who died of stomach cancer.  The patient thinks  that it may have been colon.  Another brother died.  He has a brother who  has lymphoma.   SOCIAL HISTORY:  He is divorced.  He has 2 children.  He is retired.  He  smokes 1  pack of cigarettes daily.  He denies any alcohol use.   REVIEW OF SYSTEMS:  Please see HPI for GI.  GENERAL:  States he lost a few  pounds with recent urinary obstruction illness.  He has gained 3 pounds  back.  CARDIOPULMONARY:  Denies any chest pain, or shortness of breath.   PHYSICAL EXAMINATION:  VITAL SIGNS:  Weight 156. Height 5 feet 7 inches.  Temperature 97.6, blood pressure 119/78, pulse 88.  GENERAL:  A pleasant, well-nourished, well-developed, Caucasian male in no  acute distress.  SKIN:  Warm and dry.  No jaundice.  HEENT:  Conjunctivae are pink.  Sclerae anicteric.  Oropharyngeal mucosa  moist and pink.  No lesions, erythema or exudate.  NECK:  No lymphadenopathy or thyromegaly.  CHEST:  Lungs are clear to auscultation.  CARDIOVASCULAR:  Cardiac exam reveals regular rate and rhythm.  A 2/6  systolic ejection murmur heard best in the precordium.  ABDOMEN:  Positive bowel sounds, soft, nondistended.  He has a dime-sized  umbilical hernia which is easily reducible. It is tender to touch, however.  Liver edge is palpable below the right costal margin in the midclavicular  line, which does not appear to be enlarged.  EXTREMITIES:  No edema.   IMPRESSION:  1. This patient is a 77 year old gentleman who was recently found to have     mild anemia felt to be due to iron  deficiency based on studies.  Most     recent hemoglobin has shown some improvement. Initially would wonder if     this is due to his hematuria within the last couple of months. Apparently     he had gross hematuria which may have dropped his hemoglobin enough to     cause the iron-deficiency anemia.  At any rate he has never had a     colonoscopy and have recommended one to rule out the possibility of     chronic occult gastrointestinal blood loss due to colonic polyps or even     cancer, although less likely.  He does have a family history of possible     colon cancer in a sister.  2. He has umbilical hernia on examination which does not appear to be     incarcerated.  It is easily reducible. He states that he has had this for     years and it is always very sensitive and is tender with examination     today.  He may need to consider having this repaired.  3. The patient has a heart murmur on examination, will provide SB     prophylaxis.   PLAN:  1. Colonoscopy in the near future.  2. SB prophylaxis.  3.     Will have Dr. Jena Gauss examine the patient at colonoscopy regarding his     umbilical hernia and make recommendations regarding possible surgical     referral.   I would like to thank Dr. Reola Calkins for allowing [place] to take pare in the care of  this patient.     Tana Coast, Pricilla Larsson, M.D.    LL/MEDQ  D:  04/29/2002  T:  04/29/2002  Job:  [id number]   cc:   Dr. Paris Lore  [place]

## 2010-06-23 NOTE — Op Note (Signed)
NAME:  Steven [name], Steven [name]                           ACCOUNT NO.:  [id number]   MEDICAL RECORD NO.:  [id number]                   PATIENT TYPE:  AMB   LOCATION:  [facility]                                 FACILITY:  [facility]   PHYSICIAN:  Ronald L. Ovidio Hanger, M.D.           DATE OF BIRTH:  1928/06/15   DATE OF PROCEDURE:  03/30/2003  DATE OF DISCHARGE:                                 OPERATIVE REPORT   PREOPERATIVE DIAGNOSES:  Hematuria, positive NMP-22.   POSTOPERATIVE DIAGNOSES:  Hematuria, positive NMP-22.   PROCEDURE:  Cystourethroscopy, bilateral retrograde pyelograms with  bilateral renal pelvic barbotage cytologies, barbotage bladder cytologies  and bladder biopsy.   SURGEON:  Lucrezia Starch. Earlene Plater, M.D.   ANESTHESIA:  LMA   ESTIMATED BLOOD LOSS:  Negligible.   TUBES:  None.   COMPLICATIONS:  None.   INDICATIONS FOR PROCEDURE:  [name] is a very nice 77 year old white male  who presented with gross hematuria.  He has undergone a very thorough workup  consisting of office cystourethroscopy.  A CT scan of the abdomen and pelvis  with and without contrast, etc.  The only significant abnormality he was  noted to have was a positive NMP-22.  He had some abnormal urothelial cells  on voided cytologies that were really nondiagnostic and after understanding  the risks, benefits, and alternatives elected to proceed with the above  procedure.   DESCRIPTION OF PROCEDURE:  The patient was placed in the supine position,  after proper LMA anesthesia was placed in the dorsal lithotomy position and  prepped and draped with Betadine in a sterile fashion.  Cystourethroscopy  was then performed with a 22.5 [place] ACMI panendoscope utilizing the 12 and  70 degree lenses. The bladder was carefully inspected, efflux of clear urine  was noted from the normally placed ureteral orifices bilaterally.  Attempts  to pass a 6 [place] open ended catheter into the ureters were unsuccessful.  He had some J hooking  due to some prostatic enlargement.  Therefore a 0.038  [place] Glidewire was placed into the right renal pelvis. The open ended  catheter was placed, barbotage right renal pelvic cytologies were then  performed with normal saline and submitted to pathology. Dye was injected  and the retrograde ureteral pyelogram was performed and really no  abnormalities noted.  A few bubbles were noted in the injection. A similar  procedure was performed on the left side, there was also J hooking and the  Glidewire had to be used to facilitate safe passage of the 6 [place] open  ended catheter.  Again left renal pelvic barbotage cytologies were performed  along with retrograde pyelogram and the retrograde pyelogram was again  normal with no lesions.  The bladder was then inspected and no specific  abnormalities were noted. There was slight inflammation in the posterior  wall therefore cold cup biopsy was performed in the posterior wall and  submitted to  pathology. The base was cauterized with Bugbee coagulation  cautery. Barbotage bladder cytology was then obtained and again separately  submitted. He was noted to have mild  trilobar hypertrophy, grade 1 trabeculation and again with the 12 and 70  degree lenses, the bladder was carefully inspected and noted to be without  other lesions. The bladder was drained, the panendoscope was removed  visually and the patient was taken to the recovery room stable.                                               Ronald L. Ovidio Hanger, M.D.    RLD/MEDQ  D:  03/30/2003  T:  03/30/2003  Job:  [id number]

## 2010-11-01 LAB — POCT CARDIAC MARKERS
CKMB, poc: 1.6
Myoglobin, poc: 82.7
Operator id: 213721
Troponin i, poc: <0.05

## 2010-11-01 LAB — DIFFERENTIAL
Basophils Absolute: 0
Basophils Absolute: 0.1
Basophils Relative: 0
Basophils Relative: 1
Eosinophils Absolute: 0
Eosinophils Absolute: 0.1
Eosinophils Relative: 0
Eosinophils Relative: 1
Lymphocytes Relative: 31
Lymphocytes Relative: 5 — ABNORMAL LOW
Lymphs Abs: 0.6 — ABNORMAL LOW
Lymphs Abs: 2.1
Monocytes Absolute: 0.4
Monocytes Absolute: 0.4
Monocytes Relative: 3
Monocytes Relative: 5
Neutro Abs: 10.8 — ABNORMAL HIGH
Neutro Abs: 4.3
Neutrophils Relative %: 62
Neutrophils Relative %: 91 — ABNORMAL HIGH

## 2010-11-01 LAB — BLOOD GAS, ARTERIAL
Acid-Base Excess: 1.3
Acid-base deficit: 1.9
Acid-base deficit: 6 — ABNORMAL HIGH
Bicarbonate: 19.1 — ABNORMAL LOW
Bicarbonate: 22.2
Bicarbonate: 25.9 — ABNORMAL HIGH
FIO2: 21
O2 Content: 2
O2 Content: 2
O2 Content: 21
O2 Saturation: 93.2
O2 Saturation: 96.3
O2 Saturation: 97.1
Patient temperature: 37
Patient temperature: 37
Patient temperature: 37
TCO2: 17.6
TCO2: 23.1
pCO2 arterial: 36.7
pCO2 arterial: 39.4
pCO2 arterial: 44.9
pH, Arterial: 7.307 — ABNORMAL LOW
pH, Arterial: 7.379
pH, Arterial: 7.398
pO2, Arterial: 69.6 — ABNORMAL LOW
pO2, Arterial: 86
pO2, Arterial: 92

## 2010-11-01 LAB — URINE CULTURE
Colony Count: NO GROWTH
Culture: NO GROWTH
Special Requests: NEGATIVE

## 2010-11-01 LAB — BASIC METABOLIC PANEL
BUN: 14
CO2: 28
Calcium: 9.5
Chloride: 105
Creatinine, Ser: 1.12
GFR calc Af Amer: >60
GFR calc non Af Amer: >60
Glucose, Bld: 132 — ABNORMAL HIGH
Potassium: 3.8
Sodium: 139

## 2010-11-01 LAB — COMPREHENSIVE METABOLIC PANEL WITH GFR
ALT: 23
AST: 37
Albumin: 3.4 — ABNORMAL LOW
Alkaline Phosphatase: 53
BUN: 21
CO2: 23
Calcium: 9.4
Chloride: 101
Creatinine, Ser: 1.52 — ABNORMAL HIGH
GFR calc non Af Amer: 45 — ABNORMAL LOW
Glucose, Bld: 161 — ABNORMAL HIGH
Potassium: 3.8
Sodium: 134 — ABNORMAL LOW
Total Bilirubin: 0.5
Total Protein: 6.6

## 2010-11-01 LAB — CBC
HCT: 34.3 — ABNORMAL LOW
HCT: 38.5 — ABNORMAL LOW
Hemoglobin: 12.1 — ABNORMAL LOW
Hemoglobin: 13.4
MCHC: 34.9
MCHC: 35.1
MCV: 92
MCV: 92.7
Platelets: 153
Platelets: 158
RBC: 3.7 — ABNORMAL LOW
RBC: 4.18 — ABNORMAL LOW
RDW: 13.1
RDW: 13.3
WBC: 11.9 — ABNORMAL HIGH
WBC: 6.8

## 2010-11-01 LAB — B-NATRIURETIC PEPTIDE (CONVERTED LAB)
Pro B Natriuretic peptide (BNP): 252 — ABNORMAL HIGH
Pro B Natriuretic peptide (BNP): 35.8

## 2010-11-01 LAB — CARDIAC PANEL(CRET KIN+CKTOT+MB+TROPI)
CK, MB: 3
CK, MB: 4.8 — ABNORMAL HIGH
CK, MB: 7.7 — ABNORMAL HIGH
CK, MB: 8.4 — ABNORMAL HIGH
CK, MB: 8.4 — ABNORMAL HIGH
Relative Index: 3.3 — ABNORMAL HIGH
Relative Index: 3.3 — ABNORMAL HIGH
Relative Index: 3.5 — ABNORMAL HIGH
Relative Index: 4.7 — ABNORMAL HIGH
Relative Index: INVALID
Total CK: 102
Total CK: 221
Total CK: 251 — ABNORMAL HIGH
Total CK: 251 — ABNORMAL HIGH
Total CK: 67
Troponin I: 0.04
Troponin I: 0.07 — ABNORMAL HIGH

## 2010-11-01 LAB — URINE MICROSCOPIC-ADD ON

## 2010-11-01 LAB — URINALYSIS, ROUTINE W REFLEX MICROSCOPIC
Bilirubin Urine: NEGATIVE
Glucose, UA: NEGATIVE
Hgb urine dipstick: NEGATIVE
Ketones, ur: NEGATIVE
Nitrite: NEGATIVE
Protein, ur: NEGATIVE
Specific Gravity, Urine: <1.005 — ABNORMAL LOW
Urobilinogen, UA: 0.2
pH: 6

## 2010-11-01 LAB — PROTIME-INR
INR: 1
Prothrombin Time: 13.6

## 2010-11-01 LAB — APTT: aPTT: 30

## 2010-11-01 LAB — D-DIMER, QUANTITATIVE (NOT AT ARMC): D-Dimer, Quant: 1.6 — ABNORMAL HIGH

## 2010-11-02 LAB — BASIC METABOLIC PANEL
BUN: 26 — ABNORMAL HIGH
CO2: 28
Calcium: 9.8
Chloride: 105
Creatinine, Ser: 1.25
GFR calc Af Amer: >60
GFR calc non Af Amer: 56 — ABNORMAL LOW
Glucose, Bld: 135 — ABNORMAL HIGH
Potassium: 4.3
Sodium: 140

## 2010-11-02 LAB — CBC
HCT: 32.5 — ABNORMAL LOW
HCT: 37 — ABNORMAL LOW
Hemoglobin: 11.3 — ABNORMAL LOW
Hemoglobin: 12.6 — ABNORMAL LOW
MCHC: 34.7
MCHC: 34.1
MCV: 93
MCV: 92.7
Platelets: 147 — ABNORMAL LOW
Platelets: 168
RBC: 3.5 — ABNORMAL LOW
RBC: 3.99 — ABNORMAL LOW
RDW: 13.3
RDW: 13.8
WBC: 9.7
WBC: 8.4

## 2010-11-02 LAB — COMPREHENSIVE METABOLIC PANEL
ALT: 25
AST: 28
Albumin: 2.7 — ABNORMAL LOW
Alkaline Phosphatase: 48
BUN: 24 — ABNORMAL HIGH
CO2: 28
Calcium: 9.6
Chloride: 108
Creatinine, Ser: 1.24
GFR calc Af Amer: >60
GFR calc non Af Amer: 57 — ABNORMAL LOW
Glucose, Bld: 139 — ABNORMAL HIGH
Potassium: 4.6
Sodium: 142
Total Bilirubin: 0.5
Total Protein: 5.5 — ABNORMAL LOW

## 2010-11-02 LAB — POCT I-STAT 3, VENOUS BLOOD GAS (G3P V)
Acid-Base Excess: 1
Bicarbonate: 26.5 — ABNORMAL HIGH
O2 Saturation: 62
Operator id: 142231
TCO2: 28
pCO2, Ven: 43.5 — ABNORMAL LOW
pH, Ven: 7.393 — ABNORMAL HIGH
pO2, Ven: 33

## 2010-11-02 LAB — DIFFERENTIAL
Basophils Absolute: 0
Basophils Relative: 0
Eosinophils Absolute: 0
Eosinophils Relative: 0
Lymphocytes Relative: 7 — ABNORMAL LOW
Lymphs Abs: 0.7
Monocytes Absolute: 0.4
Monocytes Relative: 4
Neutro Abs: 8.6 — ABNORMAL HIGH
Neutrophils Relative %: 89 — ABNORMAL HIGH

## 2010-11-02 LAB — LIPID PANEL
Cholesterol: 154
HDL: 65
LDL Cholesterol: 72
Total CHOL/HDL Ratio: 2.4
Triglycerides: 83
VLDL: 17

## 2010-11-02 LAB — POCT I-STAT 3, ART BLOOD GAS (G3+)
Acid-Base Excess: 5 — ABNORMAL HIGH
Bicarbonate: 30 — ABNORMAL HIGH
O2 Saturation: 89
Operator id: 142231
TCO2: 31
pCO2 arterial: 47.1 — ABNORMAL HIGH
pH, Arterial: 7.413
pO2, Arterial: 56 — ABNORMAL LOW

## 2010-11-02 LAB — TSH: TSH: 0.542

## 2011-02-06 DIAGNOSIS — C801 Malignant (primary) neoplasm, unspecified: Secondary | ICD-10-CM

## 2011-02-06 HISTORY — DX: Malignant (primary) neoplasm, unspecified: C80.1

## 2011-04-06 ENCOUNTER — Other Ambulatory Visit: Payer: Self-pay | Admitting: Family Medicine

## 2011-04-06 DIAGNOSIS — R0989 Other specified symptoms and signs involving the circulatory and respiratory systems: Secondary | ICD-10-CM

## 2011-04-10 ENCOUNTER — Ambulatory Visit (HOSPITAL_COMMUNITY)
Admission: RE | Admit: 2011-04-10 | Discharge: 2011-04-10 | Disposition: A | Discharge status: Home / Self Care | Payer: Medicare Other | Source: Ambulatory Visit | Attending: Family Medicine | Admitting: Family Medicine

## 2011-04-10 DIAGNOSIS — I10 Essential (primary) hypertension: Secondary | ICD-10-CM | POA: Insufficient documentation

## 2011-04-10 DIAGNOSIS — I658 Occlusion and stenosis of other precerebral arteries: Secondary | ICD-10-CM | POA: Insufficient documentation

## 2011-04-10 DIAGNOSIS — R0989 Other specified symptoms and signs involving the circulatory and respiratory systems: Secondary | ICD-10-CM | POA: Insufficient documentation

## 2011-04-10 DIAGNOSIS — I2510 Atherosclerotic heart disease of native coronary artery without angina pectoris: Secondary | ICD-10-CM | POA: Insufficient documentation

## 2011-04-10 DIAGNOSIS — I6529 Occlusion and stenosis of unspecified carotid artery: Secondary | ICD-10-CM | POA: Insufficient documentation

## 2011-04-22 ENCOUNTER — Inpatient Hospital Stay (HOSPITAL_COMMUNITY)
Admission: EM | Admit: 2011-04-22 | Discharge: 2011-04-25 | DRG: 124 | Disposition: A | Discharge status: Home / Self Care | Payer: Medicare Other | Source: Ambulatory Visit | Attending: General Surgery | Admitting: General Surgery

## 2011-04-22 ENCOUNTER — Emergency Department (HOSPITAL_COMMUNITY): Payer: Medicare Other

## 2011-04-22 ENCOUNTER — Encounter (HOSPITAL_COMMUNITY): Payer: Self-pay | Admitting: *Deleted

## 2011-04-22 ENCOUNTER — Other Ambulatory Visit: Payer: Self-pay

## 2011-04-22 DIAGNOSIS — I252 Old myocardial infarction: Secondary | ICD-10-CM

## 2011-04-22 DIAGNOSIS — Z87891 Personal history of nicotine dependence: Secondary | ICD-10-CM

## 2011-04-22 DIAGNOSIS — I10 Essential (primary) hypertension: Secondary | ICD-10-CM | POA: Diagnosis present

## 2011-04-22 DIAGNOSIS — E785 Hyperlipidemia, unspecified: Secondary | ICD-10-CM | POA: Diagnosis present

## 2011-04-22 DIAGNOSIS — S065X9A Traumatic subdural hemorrhage with loss of consciousness of unspecified duration, initial encounter: Secondary | ICD-10-CM

## 2011-04-22 DIAGNOSIS — S0230XA Fracture of orbital floor, unspecified side, initial encounter for closed fracture: Principal | ICD-10-CM | POA: Diagnosis present

## 2011-04-22 DIAGNOSIS — I2510 Atherosclerotic heart disease of native coronary artery without angina pectoris: Secondary | ICD-10-CM | POA: Diagnosis present

## 2011-04-22 DIAGNOSIS — S02839A Fracture of medial orbital wall, unspecified side, initial encounter for closed fracture: Secondary | ICD-10-CM

## 2011-04-22 DIAGNOSIS — Z951 Presence of aortocoronary bypass graft: Secondary | ICD-10-CM

## 2011-04-22 DIAGNOSIS — S065X0A Traumatic subdural hemorrhage without loss of consciousness, initial encounter: Secondary | ICD-10-CM | POA: Diagnosis present

## 2011-04-22 DIAGNOSIS — S065XAA Traumatic subdural hemorrhage with loss of consciousness status unknown, initial encounter: Secondary | ICD-10-CM | POA: Diagnosis present

## 2011-04-22 DIAGNOSIS — Y92009 Unspecified place in unspecified non-institutional (private) residence as the place of occurrence of the external cause: Secondary | ICD-10-CM

## 2011-04-22 DIAGNOSIS — S0280XA Fracture of other specified skull and facial bones, unspecified side, initial encounter for closed fracture: Secondary | ICD-10-CM

## 2011-04-22 DIAGNOSIS — S0232XA Fracture of orbital floor, left side, initial encounter for closed fracture: Secondary | ICD-10-CM | POA: Diagnosis present

## 2011-04-22 HISTORY — DX: Essential (primary) hypertension: I10

## 2011-04-22 HISTORY — DX: Acute myocardial infarction, unspecified: I21.9

## 2011-04-22 HISTORY — DX: Anxiety disorder, unspecified: F41.9

## 2011-04-22 HISTORY — DX: Hyperlipidemia, unspecified: E78.5

## 2011-04-22 LAB — MRSA PCR SCREENING: MRSA by PCR: POSITIVE — AB

## 2011-04-22 LAB — DIFFERENTIAL
Basophils Absolute: 0 10*3/uL (ref 0.0–0.1)
Basophils Relative: 1 % (ref 0–1)
Eosinophils Absolute: 0.1 10*3/uL (ref 0.0–0.7)
Eosinophils Relative: 1 % (ref 0–5)
Lymphocytes Relative: 14 % (ref 12–46)
Lymphs Abs: 1 10*3/uL (ref 0.7–4.0)
Monocytes Absolute: 0.5 10*3/uL (ref 0.1–1.0)
Monocytes Relative: 6 % (ref 3–12)
Neutro Abs: 5.6 10*3/uL (ref 1.7–7.7)
Neutrophils Relative %: 79 % — ABNORMAL HIGH (ref 43–77)

## 2011-04-22 LAB — CBC
HCT: 39.4 % (ref 39.0–52.0)
Hemoglobin: 12.8 g/dL — ABNORMAL LOW (ref 13.0–17.0)
MCH: 30 pg (ref 26.0–34.0)
MCHC: 32.5 g/dL (ref 30.0–36.0)
MCV: 92.5 fL (ref 78.0–100.0)
Platelets: 147 10*3/uL — ABNORMAL LOW (ref 150–400)
RBC: 4.26 MIL/uL (ref 4.22–5.81)
RDW: 13 % (ref 11.5–15.5)
WBC: 7.2 10*3/uL (ref 4.0–10.5)

## 2011-04-22 LAB — COMPREHENSIVE METABOLIC PANEL
ALT: 26 U/L (ref 0–53)
AST: 32 U/L (ref 0–37)
Albumin: 4 g/dL (ref 3.5–5.2)
Alkaline Phosphatase: 78 U/L (ref 39–117)
BUN: 23 mg/dL (ref 6–23)
CO2: 31 mEq/L (ref 19–32)
Calcium: 10.2 mg/dL (ref 8.4–10.5)
Chloride: 103 mEq/L (ref 96–112)
Creatinine, Ser: 1.24 mg/dL (ref 0.50–1.35)
GFR calc Af Amer: 62 mL/min — ABNORMAL LOW (ref >90)
GFR calc non Af Amer: 53 mL/min — ABNORMAL LOW (ref >90)
Glucose, Bld: 167 mg/dL — ABNORMAL HIGH (ref 70–99)
Potassium: 4 mEq/L (ref 3.5–5.1)
Sodium: 142 mEq/L (ref 135–145)
Total Bilirubin: 0.3 mg/dL (ref 0.3–1.2)
Total Protein: 7.8 g/dL (ref 6.0–8.3)

## 2011-04-22 LAB — CARDIAC PANEL(CRET KIN+CKTOT+MB+TROPI)
CK, MB: 2.4 ng/mL (ref 0.3–4.0)
Relative Index: 1.5 (ref 0.0–2.5)
Total CK: 155 U/L (ref 7–232)
Troponin I: <0.3 ng/mL (ref <0.30)

## 2011-04-22 MED ORDER — CLONAZEPAM 1 MG PO TABS
1.0000 mg | ORAL_TABLET | Freq: Three times a day (TID) | ORAL | Status: DC
  Administered 2011-04-22 – 2011-04-25 (×8): 1 mg via ORAL
  Filled 2011-04-22 (×9): qty 1

## 2011-04-22 MED ORDER — ONDANSETRON HCL 4 MG/2ML IJ SOLN: 4.0000 mg | Freq: Four times a day (QID) | INTRAMUSCULAR | Status: DC | PRN

## 2011-04-22 MED ORDER — PANTOPRAZOLE SODIUM 40 MG IV SOLR
40.0000 mg | Freq: Every day | INTRAVENOUS | Status: DC
  Filled 2011-04-22 (×2): qty 40

## 2011-04-22 MED ORDER — DOCUSATE SODIUM 100 MG PO CAPS
100.0000 mg | ORAL_CAPSULE | Freq: Two times a day (BID) | ORAL | Status: DC
  Administered 2011-04-22 – 2011-04-25 (×5): 100 mg via ORAL
  Filled 2011-04-22 (×6): qty 1

## 2011-04-22 MED ORDER — POTASSIUM CHLORIDE IN NACL 20-0.9 MEQ/L-% IV SOLN
INTRAVENOUS | Status: DC
  Administered 2011-04-22 – 2011-04-23 (×2): via INTRAVENOUS
  Filled 2011-04-22 (×3): qty 1000

## 2011-04-22 MED ORDER — MORPHINE SULFATE 2 MG/ML IJ SOLN
2.0000 mg | INTRAMUSCULAR | Status: DC | PRN
  Administered 2011-04-22: 2 mg via INTRAVENOUS
  Filled 2011-04-22: qty 1

## 2011-04-22 MED ORDER — ATORVASTATIN CALCIUM 10 MG PO TABS
10.0000 mg | ORAL_TABLET | Freq: Every day | ORAL | Status: DC
  Administered 2011-04-23 – 2011-04-25 (×3): 10 mg via ORAL
  Filled 2011-04-22 (×3): qty 1

## 2011-04-22 MED ORDER — PANTOPRAZOLE SODIUM 40 MG PO TBEC
40.0000 mg | DELAYED_RELEASE_TABLET | Freq: Every day | ORAL | Status: DC
  Administered 2011-04-23 – 2011-04-24 (×3): 40 mg via ORAL
  Filled 2011-04-22 (×3): qty 1

## 2011-04-22 MED ORDER — ONDANSETRON HCL 4 MG PO TABS: 4.0000 mg | ORAL_TABLET | Freq: Four times a day (QID) | ORAL | Status: DC | PRN

## 2011-04-22 MED ORDER — DOXAZOSIN MESYLATE 2 MG PO TABS
2.0000 mg | ORAL_TABLET | Freq: Every day | ORAL | Status: DC
  Administered 2011-04-23 – 2011-04-25 (×3): 2 mg via ORAL
  Filled 2011-04-22 (×3): qty 1

## 2011-04-22 MED ORDER — ALUM & MAG HYDROXIDE-SIMETH 200-200-20 MG/5ML PO SUSP
30.0000 mL | ORAL | Status: DC | PRN
  Administered 2011-04-22: 30 mL via ORAL
  Filled 2011-04-22: qty 30

## 2011-04-22 MED ORDER — OXYCODONE HCL 5 MG PO TABS
5.0000 mg | ORAL_TABLET | ORAL | Status: DC | PRN
  Administered 2011-04-23 – 2011-04-24 (×4): 5 mg via ORAL
  Filled 2011-04-22 (×5): qty 1

## 2011-04-22 NOTE — ED Notes (Signed)
Pt is a resident of [facility] living facility in [place], [place]. Phoned [employer] and police were at the scene per communications.

## 2011-04-22 NOTE — ED Notes (Signed)
Pt remains A/O x 4. Pt watching race, concerned of who is in the lead.
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
## 2011-04-22 NOTE — ED Notes (Signed)
Ice pack applied to left eye.

## 2011-04-22 NOTE — ED Notes (Signed)
Left eye is purple in color and swollen shut with blood tricking from palpebral fissure.
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
## 2011-04-23 ENCOUNTER — Inpatient Hospital Stay (HOSPITAL_COMMUNITY): Payer: Medicare Other

## 2011-04-23 LAB — BASIC METABOLIC PANEL
BUN: 17 mg/dL (ref 6–23)
CO2: 29 mEq/L (ref 19–32)
Calcium: 9.1 mg/dL (ref 8.4–10.5)
Chloride: 108 mEq/L (ref 96–112)
Creatinine, Ser: 1.08 mg/dL (ref 0.50–1.35)
GFR calc Af Amer: 73 mL/min — ABNORMAL LOW (ref >90)
GFR calc non Af Amer: 63 mL/min — ABNORMAL LOW (ref >90)
Glucose, Bld: 99 mg/dL (ref 70–99)
Potassium: 4.1 mEq/L (ref 3.5–5.1)
Sodium: 142 mEq/L (ref 135–145)

## 2011-04-23 LAB — CBC
HCT: 34.4 % — ABNORMAL LOW (ref 39.0–52.0)
Hemoglobin: 11.3 g/dL — ABNORMAL LOW (ref 13.0–17.0)
MCH: 30.1 pg (ref 26.0–34.0)
MCHC: 32.8 g/dL (ref 30.0–36.0)
MCV: 91.5 fL (ref 78.0–100.0)
Platelets: 121 10*3/uL — ABNORMAL LOW (ref 150–400)
RBC: 3.76 MIL/uL — ABNORMAL LOW (ref 4.22–5.81)
RDW: 13.1 % (ref 11.5–15.5)
WBC: 5.6 10*3/uL (ref 4.0–10.5)

## 2011-04-23 LAB — PROTIME-INR
INR: 1.09 (ref 0.00–1.49)
Prothrombin Time: 14.3 seconds (ref 11.6–15.2)

## 2011-04-23 MED ORDER — MUPIROCIN 2 % EX OINT
1.0000 "application " | TOPICAL_OINTMENT | Freq: Two times a day (BID) | CUTANEOUS | Status: DC
  Administered 2011-04-23 – 2011-04-24 (×4): 1 via NASAL
  Filled 2011-04-23: qty 22

## 2011-04-23 MED ORDER — CHLORHEXIDINE GLUCONATE CLOTH 2 % EX PADS
6.0000 | MEDICATED_PAD | Freq: Every day | CUTANEOUS | Status: DC
  Administered 2011-04-23 – 2011-04-25 (×3): 6 via TOPICAL

## 2011-04-23 MED ORDER — ACETAMINOPHEN 325 MG PO TABS: 650.0000 mg | ORAL_TABLET | Freq: Four times a day (QID) | ORAL | Status: DC | PRN

## 2011-04-23 NOTE — Progress Notes (Signed)
Utilization review completed. Steven Streed [name]3/18/2013  
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
## 2011-04-23 NOTE — Progress Notes (Signed)
Patient ID: Steven [name], male   DOB: [birth year], 77 y.o.   MRN: [id number] Awake,oriented x 3. Decrease of eye swelling. Spoke with daughter.
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
## 2011-04-24 NOTE — Progress Notes (Signed)
Improving Patient examined and I agree with the assessment and plan  Violeta Gelinas, MD, MPH, FACS Pager: [phone]  04/24/2011 12:40 PM

## 2011-04-24 NOTE — Progress Notes (Signed)
Physical Therapy Treatment and Discharge Patient Details Name: Steven [name] MRN: [id number] DOB: [birth year] Today's Date: 04/24/2011  PT Assessment/Plan Comments on Treatment Session: Pt much better today and feels he is getting closer to his baseline, "just a bit weak from staying in bed so much." Encouraged pt to ambulate with nursing supervision at least two more times today. Pt being discharged from PT. Berg Balance Score 51/56. PT Plan: All goals met and education completed, patient dischaged from PT services;Discharge plan needs to be updated PT Frequency: Other (Comment) (d/c from PT) Follow Up Recommendations: No PT follow up (discussed HHPT safety eval and pt declined; I don't need that Equipment Recommended: None recommended by PT PT Goals  Acute Rehab PT Goals PT Goal: Supine/Side to Sit - Progress: Met PT Goal: Sit to Supine/Side - Progress: Met PT Goal: Sit to Stand - Progress: Met PT Goal: Stand to Sit - Progress: Met PT Goal: Ambulate - Progress: Progressing toward goal PT Goal: Perform Home Exercise Program - Progress: Not met  PT Treatment Precautions/Restrictions  Precautions Precautions: Other (comment) (none) Restrictions Weight Bearing Restrictions: No Mobility (including Balance) Bed Mobility Supine to Sit: 7: Independent;HOB flat Sitting - Scoot to Edge of Bed: 7: Independent Transfers Sit to Stand: 7: Independent;Without upper extremity assist;From bed Sit to Stand Details (indicate cue type and reason): denied dizziness, good awareness of lines to monitor Stand to Sit: 7: Independent;Without upper extremity assist;To chair/3-in-1 Ambulation/Gait Ambulation/Gait Assistance: 5: Supervision Ambulation/Gait Assistance Details (indicate cue type and reason): minguard assist progressing to supervision; pt reports feeling weak, however no shaking or imbalance noted;  Ambulation Distance (Feet): 160 Feet Assistive device: None Gait Pattern: Within Functional  Limits Gait velocity: pt with good ability to vary speed fast and slow  Berg Balance Test Sit to Stand: Able to stand without using hands and stabilize independently Standing Unsupported: Able to stand safely 2 minutes Sitting with Back Unsupported but Feet Supported on Floor or Stool: Able to sit safely and securely 2 minutes Stand to Sit: Sits safely with minimal use of hands Transfers: Able to transfer safely, minor use of hands Standing Unsupported with Eyes Closed: Able to stand 10 seconds safely Standing Ubsupported with Feet Together: Able to place feet together independently but unable to hold for 30 seconds From Standing, Reach Forward with Outstretched Arm: Can reach confidently >25 cm (10") From Standing Position, Pick up Object from Floor: Able to pick up shoe safely and easily From Standing Position, Turn to Look Behind Over each Shoulder: Looks behind from both sides and weight shifts well Turn 360 Degrees: Able to turn 360 degrees safely in 4 seconds or less Standing Unsupported, Alternately Place Feet on Step/Stool: Able to stand independently and complete 8 steps >20 seconds Standing Unsupported, One Foot in Front: Able to plae foot ahead of the other independently and hold 30 seconds Standing on One Leg: Able to lift leg independently and hold 5-10 seconds Total Score: 51  High Level Balance High Level Balance Activites: Direction changes;Turns;Sudden stops;Head turns Exercise    End of Session PT - End of Session Equipment Utilized During Treatment: Gait belt Activity Tolerance: Patient tolerated treatment well Patient left: in chair;with call bell in reach;Other (comment) (pt able to verbalize need to call for assist to get up) Nurse Communication: Mobility status for ambulation General Behavior During Session: [facility] for tasks performed Cognition: [facility] for tasks performed  Dejane Scheibe 04/24/2011, 11:42 AM Pager [phone]
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
## 2011-04-24 NOTE — Progress Notes (Signed)
Patient ID: Steven [name], male   DOB: 22-Dec-1928, 78 y.o.   MRN: [id number] No c/o. Neuro stable. Ct head showed decrease of sdh. No shift. Patient can be discharge from our point of view. Will f/u in the office in 3 to 4 weeka or before prn

## 2011-04-25 MED ORDER — ACETAMINOPHEN 325 MG PO TABS: 650.0000 mg | ORAL_TABLET | Freq: Four times a day (QID) | ORAL | Status: AC | PRN

## 2011-04-25 MED ORDER — OXYCODONE HCL 5 MG PO TABS: 5.0000 mg | ORAL_TABLET | ORAL | Status: AC | PRN
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
## 2011-04-25 NOTE — Progress Notes (Signed)
Patient discharge instructions and education provided to patient and family. No further questions asked. Pt D/C home in no signs of acute distress.

## 2011-05-03 ENCOUNTER — Other Ambulatory Visit (HOSPITAL_COMMUNITY): Payer: Self-pay | Admitting: Neurosurgery

## 2011-05-21 ENCOUNTER — Ambulatory Visit (HOSPITAL_COMMUNITY)
Admission: RE | Admit: 2011-05-21 | Discharge: 2011-05-21 | Disposition: A | Discharge status: Home / Self Care | Payer: Medicare Other | Source: Ambulatory Visit | Attending: Neurosurgery | Admitting: Neurosurgery

## 2011-05-21 DIAGNOSIS — X58XXXA Exposure to other specified factors, initial encounter: Secondary | ICD-10-CM | POA: Insufficient documentation

## 2011-05-21 DIAGNOSIS — S065X0A Traumatic subdural hemorrhage without loss of consciousness, initial encounter: Secondary | ICD-10-CM | POA: Insufficient documentation

## 2011-06-12 ENCOUNTER — Other Ambulatory Visit: Payer: Self-pay | Admitting: Family Medicine

## 2011-06-12 DIAGNOSIS — R918 Other nonspecific abnormal finding of lung field: Secondary | ICD-10-CM

## 2011-06-13 ENCOUNTER — Ambulatory Visit (HOSPITAL_COMMUNITY)
Admission: RE | Admit: 2011-06-13 | Discharge: 2011-06-13 | Disposition: A | Discharge status: Home / Self Care | Payer: Medicare Other | Source: Ambulatory Visit | Attending: Family Medicine | Admitting: Family Medicine

## 2011-06-13 ENCOUNTER — Encounter (HOSPITAL_COMMUNITY): Payer: Self-pay

## 2011-06-13 DIAGNOSIS — R918 Other nonspecific abnormal finding of lung field: Secondary | ICD-10-CM

## 2011-06-13 DIAGNOSIS — J984 Other disorders of lung: Secondary | ICD-10-CM | POA: Insufficient documentation

## 2011-10-15 ENCOUNTER — Other Ambulatory Visit: Payer: Self-pay | Admitting: Family Medicine

## 2011-10-15 DIAGNOSIS — R911 Solitary pulmonary nodule: Secondary | ICD-10-CM

## 2011-10-17 ENCOUNTER — Ambulatory Visit (HOSPITAL_COMMUNITY)
Admission: RE | Admit: 2011-10-17 | Discharge: 2011-10-17 | Disposition: A | Discharge status: Home / Self Care | Payer: Medicare Other | Source: Ambulatory Visit | Attending: Family Medicine | Admitting: Family Medicine

## 2011-10-17 DIAGNOSIS — J984 Other disorders of lung: Secondary | ICD-10-CM | POA: Insufficient documentation

## 2011-10-17 DIAGNOSIS — R911 Solitary pulmonary nodule: Secondary | ICD-10-CM

## 2011-10-23 ENCOUNTER — Other Ambulatory Visit: Payer: Self-pay | Admitting: Family Medicine

## 2011-10-23 DIAGNOSIS — R222 Localized swelling, mass and lump, trunk: Secondary | ICD-10-CM

## 2011-10-29 ENCOUNTER — Encounter (HOSPITAL_COMMUNITY)
Admission: RE | Admit: 2011-10-29 | Discharge: 2011-10-29 | Disposition: A | Discharge status: Home / Self Care | Payer: Medicare Other | Source: Ambulatory Visit | Attending: Family Medicine | Admitting: Family Medicine

## 2011-10-29 DIAGNOSIS — R911 Solitary pulmonary nodule: Secondary | ICD-10-CM | POA: Insufficient documentation

## 2011-10-29 DIAGNOSIS — R222 Localized swelling, mass and lump, trunk: Secondary | ICD-10-CM

## 2011-10-29 LAB — GLUCOSE, CAPILLARY: Glucose-Capillary: 108 mg/dL — ABNORMAL HIGH (ref 70–99)

## 2011-10-29 MED ORDER — FLUDEOXYGLUCOSE F - 18 (FDG) INJECTION
17.0000 | Freq: Once | INTRAVENOUS | Status: AC | PRN
  Administered 2011-10-29: 17 via INTRAVENOUS

## 2011-11-01 ENCOUNTER — Institutional Professional Consult (permissible substitution) (INDEPENDENT_AMBULATORY_CARE_PROVIDER_SITE_OTHER): Payer: Medicare Other | Admitting: Surgery

## 2011-11-01 ENCOUNTER — Encounter: Payer: Self-pay | Admitting: Surgery

## 2011-11-01 ENCOUNTER — Encounter: Payer: Self-pay | Admitting: Radiation Oncology

## 2011-11-01 ENCOUNTER — Ambulatory Visit
Admission: RE | Admit: 2011-11-01 | Discharge: 2011-11-01 | Disposition: A | Discharge status: Home / Self Care | Payer: Medicare Other | Source: Ambulatory Visit | Attending: Radiation Oncology | Admitting: Radiation Oncology

## 2011-11-01 ENCOUNTER — Encounter (HOSPITAL_COMMUNITY): Payer: Self-pay | Admitting: Pharmacy Technician

## 2011-11-01 VITALS — BP 126/74 | HR 83 | Temp 98.0°F | Resp 18 | Ht 67.0 in | Wt 144.0 lb

## 2011-11-01 DIAGNOSIS — R911 Solitary pulmonary nodule: Secondary | ICD-10-CM

## 2011-11-01 NOTE — Progress Notes (Signed)
PCP is Horald Pollen., PA  ;  Rudi Heap, MD Referring Provider is Horald Pollen, PA  Chief Complaint  Patient presents with  . Lung Lesion    MTOC/ RLL nodule, Chest CT 10/17/11, PET Scan 10/29/11    HPI:  The patient is an 78 year old former smoker who has been followed for multiple small pulmonary nodules since 2009. CT scan of the chest in 2011 showed a new 4 mm nodule in the right lower lobe peripherally. Subsequent CT scans have shown an increase in size and this nodule and his most recent CT scan shows it to be 1.4 x 1.1 cm. A previously seen nodule adjacent to this in the right lower lobe is decreased in size. Additional scattered pulmonary nodules measured 4 mm or less in size and and are unchanged. PET scan shows hypermetabolic activity within this nodule with a maximum SUV of 7.5. There are no other hypermetabolic areas seen on the scan.  Past Medical History  Diagnosis Date  . Hypertension   . Hyperlipidemia   . Myocardial infarction   . Anxiety     Past Surgical History  Procedure Date  . Cholecystectomy   . Coronary artery bypass graft 1999    5 grafts    History reviewed. No pertinent family history.  Social History History  Substance Use Topics  . Smoking status: Former Smoker    Types: Cigarettes    Start date: 02/05/2006  . Smokeless tobacco: Not on file  . Alcohol Use: No    Current Outpatient Prescriptions  Medication Sig Dispense Refill  . acetaminophen (TYLENOL) 325 MG tablet Take 2 tablets (650 mg total) by mouth every 6 (six) hours as needed.      [name] atorvastatin (LIPITOR) 10 MG tablet Take 10 mg by mouth daily.      . clonazePAM (KLONOPIN) 1 MG tablet Take 1 mg by mouth 3 (three) times daily.      [name] doxazosin (CARDURA) 2 MG tablet Take 2 mg by mouth daily.      . vardenafil (LEVITRA) 20 MG tablet Take 20 mg by mouth daily as needed.        No Known Allergies  Review of Systems  Constitutional: Negative for activity change, appetite  change, fatigue and unexpected weight change.  HENT: Negative.   Eyes: Negative.   Respiratory: Negative.   Cardiovascular: Negative for chest pain and leg swelling.  Gastrointestinal: Negative.   Genitourinary: Negative.   Musculoskeletal: Negative.   Neurological: Negative.   Hematological: Negative.   Psychiatric/Behavioral: Negative.     BP 126/74  Pulse 83  Temp 98 F (36.7 C) (Oral)  Resp 18  Ht 5\' 7"  (1.702 m)  Wt 144 lb (65.318 kg)  BMI 22.55 kg/m2  SpO2 96% Physical Exam  Constitutional: He is oriented to person, place, and time. He appears well-developed and well-nourished.  HENT:  Head: Normocephalic and atraumatic.  Mouth/Throat: Oropharynx is clear and moist.  Eyes: Conjunctivae normal are normal. Pupils are equal, round, and reactive to light.  Neck: Normal range of motion. Neck supple. No JVD present. No thyromegaly present.  Cardiovascular: Normal rate, regular rhythm, normal heart sounds and intact distal pulses.  Exam reveals no gallop and no friction rub.   No murmur heard. Pulmonary/Chest: Effort normal and breath sounds normal. No respiratory distress. He has no rales.  Abdominal: Soft. Bowel sounds are normal. He exhibits no distension and no mass. There is no tenderness.  Musculoskeletal: Normal range of motion. He exhibits  no edema.  Lymphadenopathy:    He has no cervical adenopathy.  Neurological: He is alert and oriented to person, place, and time. He has normal strength. No cranial nerve deficit or sensory deficit.  Skin: Skin is warm and dry.  Psychiatric: He has a normal mood and affect.     Diagnostic Tests:   *RADIOLOGY REPORT*   Clinical Data: Lung nodules bilaterally.  Ex-smoker.   CT CHEST WITHOUT CONTRAST  10/17/11   Technique:  Multidetector CT imaging of the chest was performed following the standard protocol without IV contrast.   Comparison: Examinations dating back to 07/04/2007.   Findings: No pathologically enlarged  mediastinal or axillary lymph nodes.  Hilar regions are difficult to definitively evaluate without IV contrast.  Heart size normal.  No pericardial effusion.   Emphysema.  Lobulated peripheral right lower lobe nodule measures 1.4 x 1.1 cm (previously 1.1 x 0.8 cm) on image 49.  A previously seen nodule in the adjacent right lower lobe has decreased in size and is now represented by wispy amorphous density (image 48). Additional scattered pulmonary nodules measure 4 mm or less in size and are unchanged.  Calcified granuloma along the right major fissure.  Scattered pulmonary parenchymal scarring and mild central bronchiectasis.  No pleural fluid.  Debris is seen in the lower trachea.  Airway is otherwise unremarkable.   Incidental imaging of the upper abdomen shows no acute findings. No worrisome lytic or sclerotic lesions.   IMPRESSION:   1.  Enlarging peripheral right lower lobe nodule is highly worrisome for primary bronchogenic carcinoma. Consultation with the [facility] [phone]) should be considered. These results will be called to the ordering clinician or representative by the Radiologist Assistant, and communication documented in the PACS Dashboard. 2.  Resolving adjacent right lower lobe nodule. 3.  Additional scattered tiny pulmonary nodules are unchanged.     Original Report Authenticated By: Reyes Ivan, M.D.                                           *RADIOLOGY REPORT*   Clinical Data: Initial treatment strategy for enlarging right lung nodule.   NUCLEAR MEDICINE PET SKULL BASE TO THIGH  10/29/11   Fasting Blood Glucose:  108   Technique:  17.0 mCi F-18 FDG was injected intravenously. CT data was obtained and used for attenuation correction and anatomic localization only.  (This was not acquired as a diagnostic CT examination.) Additional exam technical data entered on technologist worksheet.     Comparison:  Chest CT on 10/17/2011   Findings:   Neck: No hypermetabolic lymph nodes in the neck.   Chest:  No hypermetabolic mediastinal or hilar nodes.  A 1.4 cm nodule in the peripheral right lower lobe shows hypermetabolic activity, with a maximum SUV of 7.5.  No other hypermetabolic pulmonary nodules or masses are identified.   Abdomen/Pelvis:  No abnormal hypermetabolic activity within the liver, pancreas, adrenal glands, or spleen.  No hypermetabolic lymph nodes in the abdomen or pelvis.   Skeleton:  No focal hypermetabolic activity to suggest skeletal metastasis.   IMPRESSION:   1.  1.4 cm hypermetabolic pulmonary nodule in the peripheral right lower lobe, consistent with bronchogenic carcinoma. 2.  No evidence of hypermetabolic thoracic lymph nodes or distant metastatic disease.     Original Report Authenticated By: Danae Orleans, M.D.  Impression/Plan:  He has a 1.4 cm hypermetabolic pulmonary nodule in the peripheral right lower lobe that is consistent with a bronchogenic carcinoma. Although he is 78 years old he is in good physical condition and has remained active without any symptoms of coronary ischemia, despite having coronary bypass surgery in 1999. I think he is a surgical candidate for right thoracoscopy and wedge resection of this right lower lobe lung mass. I would not plan to perform lobectomy in this 78 year old patient. I discussed the operative procedure of wedge resection with him including the possible need for a small right thoracotomy incision. I discussed the benefits and risks including but not limited to bleeding, infection, injury to the lung, prolonged air leak, a positive surgical margin requiring additional therapy, and recurrence of cancer. He understands all of this would like to proceed with surgery. We'll plan to do this on Wed, November 07, 2011.

## 2011-11-05 ENCOUNTER — Encounter (HOSPITAL_COMMUNITY): Payer: Self-pay

## 2011-11-05 ENCOUNTER — Other Ambulatory Visit: Payer: Self-pay | Admitting: *Deleted

## 2011-11-05 ENCOUNTER — Ambulatory Visit (HOSPITAL_COMMUNITY)
Admission: RE | Admit: 2011-11-05 | Discharge: 2011-11-05 | Disposition: A | Discharge status: Home / Self Care | Payer: Medicare Other | Source: Ambulatory Visit | Attending: Surgery | Admitting: Surgery

## 2011-11-05 ENCOUNTER — Encounter (HOSPITAL_COMMUNITY)
Admission: RE | Admit: 2011-11-05 | Discharge: 2011-11-05 | Disposition: A | Discharge status: Home / Self Care | Payer: Medicare Other | Source: Ambulatory Visit | Attending: Surgery | Admitting: Surgery

## 2011-11-05 VITALS — BP 138/64 | HR 78 | Temp 98.4°F | Resp 20 | Ht 67.0 in | Wt 148.5 lb

## 2011-11-05 DIAGNOSIS — R911 Solitary pulmonary nodule: Secondary | ICD-10-CM | POA: Insufficient documentation

## 2011-11-05 DIAGNOSIS — Z01812 Encounter for preprocedural laboratory examination: Secondary | ICD-10-CM | POA: Insufficient documentation

## 2011-11-05 DIAGNOSIS — R918 Other nonspecific abnormal finding of lung field: Secondary | ICD-10-CM

## 2011-11-05 DIAGNOSIS — Z01818 Encounter for other preprocedural examination: Secondary | ICD-10-CM | POA: Insufficient documentation

## 2011-11-05 DIAGNOSIS — Z01810 Encounter for preprocedural cardiovascular examination: Secondary | ICD-10-CM | POA: Insufficient documentation

## 2011-11-05 LAB — URINALYSIS, ROUTINE W REFLEX MICROSCOPIC
Bilirubin Urine: NEGATIVE
Glucose, UA: NEGATIVE mg/dL
Ketones, ur: NEGATIVE mg/dL
Nitrite: NEGATIVE
Protein, ur: NEGATIVE mg/dL
Specific Gravity, Urine: 1.007 (ref 1.005–1.030)
Urobilinogen, UA: 0.2 mg/dL (ref 0.0–1.0)
pH: 6 (ref 5.0–8.0)

## 2011-11-05 LAB — BLOOD GAS, ARTERIAL
Acid-Base Excess: 1.9 mmol/L (ref 0.0–2.0)
Bicarbonate: 26 mEq/L — ABNORMAL HIGH (ref 20.0–24.0)
Drawn by: 344381
FIO2: 0.21 %
O2 Saturation: 96.6 %
Patient temperature: 98.6
TCO2: 27.3 mmol/L (ref 0–100)
pCO2 arterial: 41.4 mmHg (ref 35.0–45.0)
pH, Arterial: 7.415 (ref 7.350–7.450)
pO2, Arterial: 82.9 mmHg (ref 80.0–100.0)

## 2011-11-05 LAB — COMPREHENSIVE METABOLIC PANEL
ALT: 19 U/L (ref 0–53)
AST: 25 U/L (ref 0–37)
Albumin: 3.5 g/dL (ref 3.5–5.2)
Alkaline Phosphatase: 65 U/L (ref 39–117)
BUN: 20 mg/dL (ref 6–23)
CO2: 24 mEq/L (ref 19–32)
Calcium: 9.6 mg/dL (ref 8.4–10.5)
Chloride: 106 mEq/L (ref 96–112)
Creatinine, Ser: 1.13 mg/dL (ref 0.50–1.35)
GFR calc Af Amer: 68 mL/min — ABNORMAL LOW (ref >90)
GFR calc non Af Amer: 59 mL/min — ABNORMAL LOW (ref >90)
Glucose, Bld: 113 mg/dL — ABNORMAL HIGH (ref 70–99)
Potassium: 4 mEq/L (ref 3.5–5.1)
Sodium: 139 mEq/L (ref 135–145)
Total Bilirubin: 0.3 mg/dL (ref 0.3–1.2)
Total Protein: 7 g/dL (ref 6.0–8.3)

## 2011-11-05 LAB — CBC
HCT: 36.5 % — ABNORMAL LOW (ref 39.0–52.0)
Hemoglobin: 11.9 g/dL — ABNORMAL LOW (ref 13.0–17.0)
MCH: 30.2 pg (ref 26.0–34.0)
MCHC: 32.6 g/dL (ref 30.0–36.0)
MCV: 92.6 fL (ref 78.0–100.0)
Platelets: 120 10*3/uL — ABNORMAL LOW (ref 150–400)
RBC: 3.94 MIL/uL — ABNORMAL LOW (ref 4.22–5.81)
RDW: 13.1 % (ref 11.5–15.5)
WBC: 4.7 10*3/uL (ref 4.0–10.5)

## 2011-11-05 LAB — TYPE AND SCREEN
ABO/RH(D): A POS
Antibody Screen: NEGATIVE

## 2011-11-05 LAB — SURGICAL PCR SCREEN
MRSA, PCR: NEGATIVE
Staphylococcus aureus: NEGATIVE

## 2011-11-05 LAB — URINE MICROSCOPIC-ADD ON

## 2011-11-05 LAB — PROTIME-INR
INR: 1.01 (ref 0.00–1.49)
Prothrombin Time: 13.2 seconds (ref 11.6–15.2)

## 2011-11-05 LAB — ABO/RH: ABO/RH(D): A POS

## 2011-11-05 LAB — APTT: aPTT: 29 seconds (ref 24–37)

## 2011-11-05 NOTE — Pre-Procedure Instructions (Signed)
20 CAHLIL SATTAR  11/05/2011   Your procedure is scheduled on:  Wednesday November 07, 2011  0830 AM  Report to [facility] at 0630 AM.  Call this number if you have problems the morning of surgery: [phone]   Remember:   Do not eat food or drink:After Midnight.Tuesday     Take these medicines the morning of surgery with A SIP OF WATER: Klonopin, and Cardura   Do not wear jewelry,   Do not wear lotions, powders, or perfumes. You may wear deodorant.           . Men may shave face and neck.  Do not bring valuables to the hospital.  Contacts, dentures or bridgework may not be worn into surgery.  Leave suitcase in the car. After surgery it may be brought to your room.  For patients admitted to the hospital, checkout time is 11:00 AM the day of discharge.   Patients discharged the day of surgery will not be allowed to drive home.    Special Instructions: Shower using CHG 2 nights before surgery and the night before surgery.  If you shower the day of surgery use CHG.  Use special wash - you have one bottle of CHG for all showers.  You should use approximately 1/3 of the bottle for each shower.   Please read over the following fact sheets that you were given: Pain Booklet, Coughing and Deep Breathing, Blood Transfusion Information, MRSA Information and Surgical Site Infection Prevention

## 2011-11-06 MED ORDER — DEXTROSE 5 % IV SOLN
1.5000 g | INTRAVENOUS | Status: AC
  Administered 2011-11-07: 1.5 g via INTRAVENOUS
  Filled 2011-11-06: qty 1.5

## 2011-11-07 ENCOUNTER — Encounter (HOSPITAL_COMMUNITY): Payer: Self-pay | Admitting: Anesthesiology

## 2011-11-07 ENCOUNTER — Inpatient Hospital Stay (HOSPITAL_COMMUNITY)
Admission: RE | Admit: 2011-11-07 | Discharge: 2011-11-13 | DRG: 165 | Disposition: A | Discharge status: Skilled Nursing Facility | Payer: Medicare Other | Source: Ambulatory Visit | Attending: Surgery | Admitting: Surgery

## 2011-11-07 ENCOUNTER — Encounter (HOSPITAL_COMMUNITY)
Admission: RE | Disposition: A | Discharge status: Skilled Nursing Facility | Payer: Self-pay | Source: Ambulatory Visit | Attending: Surgery

## 2011-11-07 ENCOUNTER — Ambulatory Visit (HOSPITAL_COMMUNITY): Payer: Medicare Other | Admitting: Anesthesiology

## 2011-11-07 ENCOUNTER — Inpatient Hospital Stay (HOSPITAL_COMMUNITY): Payer: Medicare Other

## 2011-11-07 ENCOUNTER — Encounter (HOSPITAL_COMMUNITY): Payer: Self-pay | Admitting: *Deleted

## 2011-11-07 DIAGNOSIS — R404 Transient alteration of awareness: Secondary | ICD-10-CM | POA: Diagnosis not present

## 2011-11-07 DIAGNOSIS — I10 Essential (primary) hypertension: Secondary | ICD-10-CM | POA: Diagnosis present

## 2011-11-07 DIAGNOSIS — E785 Hyperlipidemia, unspecified: Secondary | ICD-10-CM | POA: Diagnosis present

## 2011-11-07 DIAGNOSIS — R918 Other nonspecific abnormal finding of lung field: Secondary | ICD-10-CM

## 2011-11-07 DIAGNOSIS — C3490 Malignant neoplasm of unspecified part of unspecified bronchus or lung: Secondary | ICD-10-CM

## 2011-11-07 DIAGNOSIS — C3430 Malignant neoplasm of lower lobe, unspecified bronchus or lung: Principal | ICD-10-CM | POA: Diagnosis present

## 2011-11-07 DIAGNOSIS — Z951 Presence of aortocoronary bypass graft: Secondary | ICD-10-CM

## 2011-11-07 DIAGNOSIS — I252 Old myocardial infarction: Secondary | ICD-10-CM

## 2011-11-07 DIAGNOSIS — Z9889 Other specified postprocedural states: Secondary | ICD-10-CM

## 2011-11-07 DIAGNOSIS — Z781 Physical restraint status: Secondary | ICD-10-CM | POA: Diagnosis present

## 2011-11-07 DIAGNOSIS — D381 Neoplasm of uncertain behavior of trachea, bronchus and lung: Secondary | ICD-10-CM

## 2011-11-07 DIAGNOSIS — R5381 Other malaise: Secondary | ICD-10-CM | POA: Diagnosis present

## 2011-11-07 DIAGNOSIS — Z87891 Personal history of nicotine dependence: Secondary | ICD-10-CM

## 2011-11-07 HISTORY — PX: FLEXIBLE BRONCHOSCOPY: SHX5094

## 2011-11-07 LAB — GLUCOSE, CAPILLARY
Glucose-Capillary: 137 mg/dL — ABNORMAL HIGH (ref 70–99)
Glucose-Capillary: 194 mg/dL — ABNORMAL HIGH (ref 70–99)
Glucose-Capillary: 85 mg/dL (ref 70–99)

## 2011-11-07 SURGERY — BRONCHOSCOPY, FLEXIBLE
Anesthesia: General | Site: Chest | Laterality: Right | Wound class: Clean Contaminated

## 2011-11-07 MED ORDER — BUPIVACAINE-EPINEPHRINE 0.25% -1:200000 IJ SOLN
INTRAMUSCULAR | Status: DC | PRN
  Administered 2011-11-07: 13 mL

## 2011-11-07 MED ORDER — CLONAZEPAM 0.5 MG PO TABS
1.0000 mg | ORAL_TABLET | Freq: Three times a day (TID) | ORAL | Status: DC
  Administered 2011-11-07 – 2011-11-13 (×16): 1 mg via ORAL
  Filled 2011-11-07 (×2): qty 1
  Filled 2011-11-07: qty 2
  Filled 2011-11-07 (×3): qty 1
  Filled 2011-11-07 (×3): qty 2
  Filled 2011-11-07: qty 1
  Filled 2011-11-07 (×3): qty 2
  Filled 2011-11-07: qty 1
  Filled 2011-11-07: qty 2
  Filled 2011-11-07: qty 1
  Filled 2011-11-07 (×2): qty 2
  Filled 2011-11-07: qty 1
  Filled 2011-11-07: qty 2
  Filled 2011-11-07 (×2): qty 1
  Filled 2011-11-07 (×2): qty 2
  Filled 2011-11-07: qty 1

## 2011-11-07 MED ORDER — ONDANSETRON HCL 4 MG/2ML IJ SOLN
4.0000 mg | Freq: Four times a day (QID) | INTRAMUSCULAR | Status: DC | PRN
Start: 2011-11-07 — End: 2011-11-13
  Administered 2011-11-08: 4 mg via INTRAVENOUS
  Filled 2011-11-07: qty 2

## 2011-11-07 MED ORDER — LACTATED RINGERS IV SOLN
INTRAVENOUS | Status: DC | PRN
  Administered 2011-11-07: 08:00:00 via INTRAVENOUS

## 2011-11-07 MED ORDER — INSULIN ASPART 100 UNIT/ML ~~LOC~~ SOLN
0.0000 [IU] | SUBCUTANEOUS | Status: DC
  Administered 2011-11-07: 2 [IU] via SUBCUTANEOUS
  Administered 2011-11-07: 4 [IU] via SUBCUTANEOUS

## 2011-11-07 MED ORDER — DOXAZOSIN MESYLATE 2 MG PO TABS
2.0000 mg | ORAL_TABLET | Freq: Every day | ORAL | Status: DC
  Administered 2011-11-07 – 2011-11-13 (×6): 2 mg via ORAL
  Filled 2011-11-07 (×7): qty 1

## 2011-11-07 MED ORDER — ACETAMINOPHEN 10 MG/ML IV SOLN
1000.0000 mg | Freq: Four times a day (QID) | INTRAVENOUS | Status: AC
  Administered 2011-11-07 – 2011-11-08 (×4): 1000 mg via INTRAVENOUS
  Filled 2011-11-07 (×4): qty 100

## 2011-11-07 MED ORDER — OXYCODONE HCL 5 MG PO TABS
5.0000 mg | ORAL_TABLET | ORAL | Status: AC | PRN
Start: 2011-11-07 — End: 2011-11-08
  Administered 2011-11-07 – 2011-11-08 (×2): 10 mg via ORAL
  Filled 2011-11-07 (×2): qty 2

## 2011-11-07 MED ORDER — VECURONIUM BROMIDE 10 MG IV SOLR
INTRAVENOUS | Status: DC | PRN
  Administered 2011-11-07: 2 mg via INTRAVENOUS

## 2011-11-07 MED ORDER — NEOSTIGMINE METHYLSULFATE 1 MG/ML IJ SOLN
INTRAMUSCULAR | Status: DC | PRN
  Administered 2011-11-07: 4 mg via INTRAVENOUS

## 2011-11-07 MED ORDER — OXYCODONE-ACETAMINOPHEN 5-325 MG PO TABS: 1.0000 | ORAL_TABLET | ORAL | Status: DC | PRN

## 2011-11-07 MED ORDER — BISACODYL 5 MG PO TBEC
10.0000 mg | DELAYED_RELEASE_TABLET | Freq: Every day | ORAL | Status: DC
  Administered 2011-11-07 – 2011-11-13 (×6): 10 mg via ORAL
  Filled 2011-11-07: qty 2
  Filled 2011-11-07: qty 1
  Filled 2011-11-07 (×6): qty 2

## 2011-11-07 MED ORDER — DEXTROSE 5 % IV SOLN
1.5000 g | Freq: Two times a day (BID) | INTRAVENOUS | Status: AC
  Administered 2011-11-07 – 2011-11-08 (×2): 1.5 g via INTRAVENOUS
  Filled 2011-11-07 (×2): qty 1.5

## 2011-11-07 MED ORDER — 0.9 % SODIUM CHLORIDE (POUR BTL) OPTIME
TOPICAL | Status: DC | PRN
  Administered 2011-11-07: 3000 mL

## 2011-11-07 MED ORDER — OXYCODONE HCL 5 MG PO TABS: 5.0000 mg | ORAL_TABLET | Freq: Once | ORAL | Status: DC | PRN

## 2011-11-07 MED ORDER — SENNOSIDES-DOCUSATE SODIUM 8.6-50 MG PO TABS
1.0000 | ORAL_TABLET | Freq: Every evening | ORAL | Status: DC | PRN
  Filled 2011-11-07: qty 1

## 2011-11-07 MED ORDER — ONDANSETRON HCL 4 MG/2ML IJ SOLN
INTRAMUSCULAR | Status: DC | PRN
  Administered 2011-11-07: 4 mg via INTRAVENOUS

## 2011-11-07 MED ORDER — WHITE PETROLATUM GEL
Status: AC
  Administered 2011-11-07: 0.2
  Filled 2011-11-07: qty 5

## 2011-11-07 MED ORDER — HYDROMORPHONE HCL PF 1 MG/ML IJ SOLN
0.2500 mg | INTRAMUSCULAR | Status: DC | PRN
  Administered 2011-11-07 (×2): 0.5 mg via INTRAVENOUS

## 2011-11-07 MED ORDER — ATORVASTATIN CALCIUM 10 MG PO TABS
10.0000 mg | ORAL_TABLET | Freq: Every day | ORAL | Status: DC
  Administered 2011-11-07 – 2011-11-12 (×6): 10 mg via ORAL
  Filled 2011-11-07 (×7): qty 1

## 2011-11-07 MED ORDER — OXYCODONE-ACETAMINOPHEN 5-325 MG PO TABS
1.0000 | ORAL_TABLET | ORAL | Status: DC | PRN
  Administered 2011-11-08: 2 via ORAL
  Administered 2011-11-09: 1 via ORAL
  Administered 2011-11-10: 2 via ORAL
  Administered 2011-11-10: 1 via ORAL
  Filled 2011-11-07 (×3): qty 2
  Filled 2011-11-07 (×2): qty 1

## 2011-11-07 MED ORDER — PHENYLEPHRINE HCL 10 MG/ML IJ SOLN
INTRAMUSCULAR | Status: DC | PRN
  Administered 2011-11-07 (×3): 80 ug via INTRAVENOUS

## 2011-11-07 MED ORDER — GLYCOPYRROLATE 0.2 MG/ML IJ SOLN
INTRAMUSCULAR | Status: DC | PRN
  Administered 2011-11-07: 0.6 mg via INTRAVENOUS

## 2011-11-07 MED ORDER — MORPHINE SULFATE 2 MG/ML IJ SOLN
2.0000 mg | INTRAMUSCULAR | Status: DC | PRN
  Administered 2011-11-08 – 2011-11-09 (×4): 2 mg via INTRAVENOUS
  Filled 2011-11-07 (×4): qty 1

## 2011-11-07 MED ORDER — LEVALBUTEROL HCL 0.63 MG/3ML IN NEBU
0.6300 mg | INHALATION_SOLUTION | Freq: Four times a day (QID) | RESPIRATORY_TRACT | Status: DC
  Administered 2011-11-07 – 2011-11-13 (×22): 0.63 mg via RESPIRATORY_TRACT
  Filled 2011-11-07 (×28): qty 3

## 2011-11-07 MED ORDER — KCL IN DEXTROSE-NACL 20-5-0.45 MEQ/L-%-% IV SOLN
INTRAVENOUS | Status: DC
  Administered 2011-11-07: 15:00:00 via INTRAVENOUS
  Filled 2011-11-07 (×2): qty 1000

## 2011-11-07 MED ORDER — HYDROMORPHONE HCL PF 1 MG/ML IJ SOLN
INTRAMUSCULAR | Status: AC
  Filled 2011-11-07: qty 1

## 2011-11-07 MED ORDER — TRAMADOL HCL 50 MG PO TABS
50.0000 mg | ORAL_TABLET | Freq: Four times a day (QID) | ORAL | Status: DC | PRN
  Administered 2011-11-07: 100 mg via ORAL
  Administered 2011-11-11: 50 mg via ORAL
  Filled 2011-11-07: qty 2
  Filled 2011-11-07: qty 1

## 2011-11-07 MED ORDER — EPHEDRINE SULFATE 50 MG/ML IJ SOLN
INTRAMUSCULAR | Status: DC | PRN
  Administered 2011-11-07 (×3): 10 mg via INTRAVENOUS

## 2011-11-07 MED ORDER — BUPIVACAINE 0.25 % ON-Q PUMP SINGLE CATH 400 ML
400.0000 mL | INJECTION | Status: DC
  Filled 2011-11-07: qty 400

## 2011-11-07 MED ORDER — BUPIVACAINE-EPINEPHRINE PF 0.25-1:200000 % IJ SOLN
INTRAMUSCULAR | Status: AC
  Filled 2011-11-07: qty 30

## 2011-11-07 MED ORDER — PROPOFOL 10 MG/ML IV BOLUS
INTRAVENOUS | Status: DC | PRN
  Administered 2011-11-07: 100 mg via INTRAVENOUS
  Administered 2011-11-07: 40 mg via INTRAVENOUS

## 2011-11-07 MED ORDER — BUPIVACAINE 0.25 % ON-Q PUMP SINGLE CATH 400 ML
INJECTION | Status: DC | PRN
  Administered 2011-11-07: 400 mL

## 2011-11-07 MED ORDER — HEMOSTATIC AGENTS (NO CHARGE) OPTIME
TOPICAL | Status: DC | PRN
  Administered 2011-11-07: 1 via TOPICAL

## 2011-11-07 MED ORDER — DROPERIDOL 2.5 MG/ML IJ SOLN: 0.6250 mg | INTRAMUSCULAR | Status: DC | PRN

## 2011-11-07 MED ORDER — ROCURONIUM BROMIDE 100 MG/10ML IV SOLN
INTRAVENOUS | Status: DC | PRN
  Administered 2011-11-07: 50 mg via INTRAVENOUS

## 2011-11-07 MED ORDER — POTASSIUM CHLORIDE 10 MEQ/50ML IV SOLN: 10.0000 meq | Freq: Every day | INTRAVENOUS | Status: DC | PRN

## 2011-11-07 MED ORDER — OXYCODONE HCL 5 MG/5ML PO SOLN: 5.0000 mg | Freq: Once | ORAL | Status: DC | PRN

## 2011-11-07 MED ORDER — FENTANYL CITRATE 0.05 MG/ML IJ SOLN
INTRAMUSCULAR | Status: DC | PRN
  Administered 2011-11-07 (×4): 50 ug via INTRAVENOUS

## 2011-11-07 SURGICAL SUPPLY — 87 items
APPLICATOR TIP EXT COSEAL (VASCULAR PRODUCTS) ×3 IMPLANT
BRUSH CYTOL CELLEBRITY 1.5X140 (MISCELLANEOUS) IMPLANT
CANISTER SUCTION 2500CC (MISCELLANEOUS) ×3 IMPLANT
CATH KIT ON Q 5IN SLV (PAIN MANAGEMENT) ×3 IMPLANT
CATH THORACIC 28FR (CATHETERS) ×3 IMPLANT
CATH THORACIC 36FR (CATHETERS) IMPLANT
CATH THORACIC 36FR RT ANG (CATHETERS) IMPLANT
CLIP TI MEDIUM 6 (CLIP) ×3 IMPLANT
CLOTH BEACON ORANGE TIMEOUT ST (SAFETY) ×6 IMPLANT
CONT SPEC 4OZ CLIKSEAL STRL BL (MISCELLANEOUS) ×6 IMPLANT
COTTONBALL LRG STERILE PKG (GAUZE/BANDAGES/DRESSINGS) IMPLANT
COVER SURGICAL LIGHT HANDLE (MISCELLANEOUS) ×6 IMPLANT
COVER TABLE BACK 60X90 (DRAPES) ×3 IMPLANT
DERMABOND ADVANCED (GAUZE/BANDAGES/DRESSINGS) ×1
DERMABOND ADVANCED .7 DNX12 (GAUZE/BANDAGES/DRESSINGS) ×2 IMPLANT
DRAPE LAPAROSCOPIC ABDOMINAL (DRAPES) ×3 IMPLANT
DRAPE SLUSH MACHINE 52X66 (DRAPES) IMPLANT
DRAPE SLUSH/WARMER DISC (DRAPES) IMPLANT
DRAPE WARM FLUID 44X44 (DRAPE) ×3 IMPLANT
ELECT REM PT RETURN 9FT ADLT (ELECTROSURGICAL) ×3
ELECTRODE REM PT RTRN 9FT ADLT (ELECTROSURGICAL) ×2 IMPLANT
FORCEPS BIOP RJ4 1.8 (CUTTING FORCEPS) IMPLANT
GLOVE BIOGEL M STER SZ 6 (GLOVE) ×3 IMPLANT
GLOVE BIOGEL PI IND STRL 6.5 (GLOVE) ×6 IMPLANT
GLOVE BIOGEL PI IND STRL 7.0 (GLOVE) ×4 IMPLANT
GLOVE BIOGEL PI IND STRL 7.5 (GLOVE) ×2 IMPLANT
GLOVE BIOGEL PI INDICATOR 6.5 (GLOVE) ×3
GLOVE BIOGEL PI INDICATOR 7.0 (GLOVE) ×2
GLOVE BIOGEL PI INDICATOR 7.5 (GLOVE) ×1
GLOVE EUDERMIC 7 POWDERFREE (GLOVE) ×9 IMPLANT
GLOVE SURG SS PI 7.5 STRL IVOR (GLOVE) ×3 IMPLANT
GOWN PREVENTION PLUS XLARGE (GOWN DISPOSABLE) ×9 IMPLANT
GOWN STRL NON-REIN LRG LVL3 (GOWN DISPOSABLE) ×6 IMPLANT
HANDLE STAPLE ENDO GIA SHORT (STAPLE) ×1
HEMOSTAT SURGICEL 2X14 (HEMOSTASIS) IMPLANT
KIT BASIN OR (CUSTOM PROCEDURE TRAY) ×3 IMPLANT
KIT ROOM TURNOVER OR (KITS) ×3 IMPLANT
MARKER SKIN DUAL TIP RULER LAB (MISCELLANEOUS) ×3 IMPLANT
NEEDLE 22X1 1/2 (OR ONLY) (NEEDLE) IMPLANT
NEEDLE BIOPSY TRANSBRONCH 21G (NEEDLE) IMPLANT
NS IRRIG 1000ML POUR BTL (IV SOLUTION) ×9 IMPLANT
OIL SILICONE PENTAX (PARTS (SERVICE/REPAIRS)) ×3 IMPLANT
PACK CHEST (CUSTOM PROCEDURE TRAY) ×3 IMPLANT
PAD ARMBOARD 7.5X6 YLW CONV (MISCELLANEOUS) ×6 IMPLANT
RELOAD EGIA 45 MED/THCK PURPLE (STAPLE) ×3 IMPLANT
RELOAD EGIA 60 MED/THCK PURPLE (STAPLE) ×6 IMPLANT
RELOAD TRI 2.0 60 XTHK VAS SUL (STAPLE) ×6 IMPLANT
SEALANT PROGEL (MISCELLANEOUS) IMPLANT
SEALANT SURG COSEAL 4ML (VASCULAR PRODUCTS) IMPLANT
SEALANT SURG COSEAL 8ML (VASCULAR PRODUCTS) ×3 IMPLANT
SOLUTION ANTI FOG 6CC (MISCELLANEOUS) ×3 IMPLANT
SPONGE GAUZE 4X4 12PLY (GAUZE/BANDAGES/DRESSINGS) ×3 IMPLANT
STAPLER ENDO GIA 12MM SHORT (STAPLE) ×2 IMPLANT
SUT PROLENE 3 0 SH DA (SUTURE) IMPLANT
SUT PROLENE 4 0 RB 1 (SUTURE)
SUT PROLENE 4-0 RB1 .5 CRCL 36 (SUTURE) IMPLANT
SUT SILK  1 MH (SUTURE) ×2
SUT SILK 1 MH (SUTURE) ×4 IMPLANT
SUT SILK 2 0 SH (SUTURE) ×3 IMPLANT
SUT SILK 2 0SH CR/8 30 (SUTURE) ×3 IMPLANT
SUT VIC AB 1 CTX 18 (SUTURE) ×3 IMPLANT
SUT VIC AB 1 CTX 36 (SUTURE) ×1
SUT VIC AB 1 CTX36XBRD ANBCTR (SUTURE) ×2 IMPLANT
SUT VIC AB 2-0 CTX 36 (SUTURE) ×3 IMPLANT
SUT VIC AB 2-0 UR6 27 (SUTURE) ×3 IMPLANT
SUT VIC AB 3-0 MH 27 (SUTURE) ×3 IMPLANT
SUT VIC AB 3-0 X1 27 (SUTURE) ×3 IMPLANT
SUT VICRYL 2 TP 1 (SUTURE) ×3 IMPLANT
SWAB COLLECTION DEVICE MRSA (MISCELLANEOUS) IMPLANT
SYR 20ML ECCENTRIC (SYRINGE) ×3 IMPLANT
SYR 5ML LUER SLIP (SYRINGE) ×3 IMPLANT
SYR CONTROL 10ML LL (SYRINGE) IMPLANT
SYSTEM SAHARA CHEST DRAIN RE-I (WOUND CARE) ×3 IMPLANT
TAPE CLOTH 4X10 WHT NS (GAUZE/BANDAGES/DRESSINGS) ×3 IMPLANT
TAPE CLOTH SURG 4X10 WHT LF (GAUZE/BANDAGES/DRESSINGS) ×3 IMPLANT
TIP APPLICATOR SPRAY EXTEND 16 (VASCULAR PRODUCTS) IMPLANT
TOWEL OR 17X24 6PK STRL BLUE (TOWEL DISPOSABLE) ×6 IMPLANT
TOWEL OR 17X26 10 PK STRL BLUE (TOWEL DISPOSABLE) ×6 IMPLANT
TRAP SPECIMEN MUCOUS 40CC (MISCELLANEOUS) ×3 IMPLANT
TRAY FOLEY CATH 14FRSI W/METER (CATHETERS) ×3 IMPLANT
TUBE ANAEROBIC SPECIMEN COL (MISCELLANEOUS) ×3 IMPLANT
TUBE CONNECTING 12X1/4 (SUCTIONS) ×3 IMPLANT
TUNNELER SHEATH ON-Q 11GX8 (MISCELLANEOUS) ×3 IMPLANT
VALVE BIOPSY  SINGLE USE (MISCELLANEOUS)
VALVE BIOPSY SINGLE USE (MISCELLANEOUS) IMPLANT
VALVE SUCTION BRONCHIO DISP (MISCELLANEOUS) IMPLANT
WATER STERILE IRR 1000ML POUR (IV SOLUTION) ×3 IMPLANT

## 2011-11-07 NOTE — Transfer of Care (Signed)
Immediate Anesthesia Transfer of Care Note  Patient: Steven [name]  Procedure(s) Performed: Procedure(s) (LRB) with comments: FLEXIBLE BRONCHOSCOPY (N/A) VIDEO ASSISTED THORACOSCOPY (VATS)/THOROCOTOMY (Right) - Right  Video Assisted Thoracoscopy , Right Mini Thoaracotomy,Right Lower Lobe Wedge Resection.  Patient Location: PACU  Anesthesia Type: General  Level of Consciousness: awake, alert  and oriented  Airway & Oxygen Therapy: Patient Spontanous Breathing and Patient connected to nasal cannula oxygen  Post-op Assessment: Report given to PACU RN and Post -op Vital signs reviewed and stable  Post vital signs: Reviewed and stable  Complications: No apparent anesthesia complications

## 2011-11-07 NOTE — Anesthesia Postprocedure Evaluation (Signed)
Anesthesia Post Note  Patient: Steven [name]  Procedure(s) Performed: Procedure(s) (LRB): FLEXIBLE BRONCHOSCOPY (N/A) VIDEO ASSISTED THORACOSCOPY (VATS)/THOROCOTOMY (Right)  Anesthesia type: general  Patient location: PACU  Post pain: Pain level controlled  Post assessment: Patient's Cardiovascular Status Stable  Last Vitals:  Filed Vitals:   11/07/11 1239  BP:   Pulse:   Temp:   Resp: 19    Post vital signs: Reviewed and stable  Level of consciousness: sedated  Complications: No apparent anesthesia complications

## 2011-11-07 NOTE — OR Nursing (Signed)
First procedure flexible bronchoscopy ended at 0857,Second procedure Right VATS started at [phone].

## 2011-11-07 NOTE — Progress Notes (Signed)
Utilization review completed.  

## 2011-11-07 NOTE — Anesthesia Preprocedure Evaluation (Addendum)
Anesthesia Evaluation  Patient identified by MRN, date of birth, ID band Patient awake    Reviewed: Allergy & Precautions, H&P , NPO status , Patient's Chart, lab work & pertinent test results, reviewed documented beta blocker date and time   History of Anesthesia Complications Negative for: history of anesthetic complications  Airway Mallampati: II TM Distance: >3 FB Neck ROM: Full    Dental  (+) Edentulous Upper, Missing and Dental Advisory Given   Pulmonary    Pulmonary exam normal       Cardiovascular hypertension, + CAD, + Past MI and + CABG negative cardio ROS      Neuro/Psych PSYCHIATRIC DISORDERS Anxiety    GI/Hepatic negative GI ROS, Neg liver ROS,   Endo/Other  negative endocrine ROS  Renal/GU negative Renal ROS     Musculoskeletal   Abdominal   Peds  Hematology   Anesthesia Other Findings   Reproductive/Obstetrics                         Anesthesia Physical Anesthesia Plan  ASA: III  Anesthesia Plan: General   Post-op Pain Management:    Induction:   Airway Management Planned: Double Lumen EBT  Additional Equipment: CVP and Arterial line  Intra-op Plan:   Post-operative Plan: Extubation in OR  Informed Consent: I have reviewed the patients History and Physical, chart, labs and discussed the procedure including the risks, benefits and alternatives for the proposed anesthesia with the patient or authorized representative who has indicated his/her understanding and acceptance.   Dental advisory given  Plan Discussed with: CRNA, Anesthesiologist and Surgeon  Anesthesia Plan Comments:         Anesthesia Quick Evaluation

## 2011-11-07 NOTE — Brief Op Note (Signed)
11/07/2011  10:31 AM  PATIENT:  Steven [name]  78 y.o. male  PRE-OPERATIVE DIAGNOSIS:  Right Lower Lobe Lung Mass  POST-OPERATIVE DIAGNOSIS:  Right Lower Lobe Lung Mass,Non Small Cell Carcinoma  PROCEDURE:  Procedure(s) (LRB) with comments: FLEXIBLE BRONCHOSCOPY (N/A)  Right  Video Assisted Thoracoscopy , Right Mini Thoaracotomy,Right Lower Lobe Wedge Resection.  SURGEON:  Surgeon(s) and Role:    * Alleen Borne, MD - Primary  PHYSICIAN ASSISTANT: Erin Barrett, PA-C   ANESTHESIA:   general  EBL:     BLOOD ADMINISTERED:none  DRAINS: 1 Chest Tube(s) in the right pleural space   LOCAL MEDICATIONS USED:  BUPIVICAINE   SPECIMEN:  Source of Specimen:  wedge resection of right lower lobe  DISPOSITION OF SPECIMEN:  PATHOLOGY  Frozen section: non-small cell carcinoma, negative margins  COUNTS:  YES  PLAN OF CARE: Admit to inpatient   PATIENT DISPOSITION:  PACU - hemodynamically stable.   Delay start of Pharmacological VTE agent (>24hrs) due to surgical blood loss or risk of bleeding: yes

## 2011-11-07 NOTE — H&P (Signed)
[address]            [name] [address]          [phone]      PCP is Horald Pollen., PA  ;  Rudi Heap, MD Referring Provider is Horald Pollen, PA    Chief Complaint   Patient presents with   .  Lung Lesion       MTOC/ RLL nodule, Chest CT 10/17/11, PET Scan 10/29/11       HPI:   The patient is an 78 year old former smoker who has been followed for multiple small pulmonary nodules since 2009. CT scan of the chest in 2011 showed a new 4 mm nodule in the right lower lobe peripherally. Subsequent CT scans have shown an increase in size and this nodule and his most recent CT scan shows it to be 1.4 x 1.1 cm. A previously seen nodule adjacent to this in the right lower lobe is decreased in size. Additional scattered pulmonary nodules measured 4 mm or less in size and and are unchanged. PET scan shows hypermetabolic activity within this nodule with a maximum SUV of 7.5. There are no other hypermetabolic areas seen on the scan.    Past Medical History   Diagnosis  Date   .  Hypertension     .  Hyperlipidemia     .  Myocardial infarction     .  Anxiety           Past Surgical History   Procedure  Date   .  Cholecystectomy     .  Coronary artery bypass graft  1999       5 grafts        History reviewed. No pertinent family history.   Social History History   Substance Use Topics   .  Smoking status:  Former Smoker       Types:  Cigarettes       Start date:  02/05/2006   .  Smokeless tobacco:  Not on file   .  Alcohol Use:  No         Current Outpatient Prescriptions   Medication  Sig  Dispense  Refill   .  acetaminophen (TYLENOL) 325 MG tablet  Take 2 tablets (650 mg total) by mouth every 6 (six) hours as needed.         [name]  atorvastatin (LIPITOR) 10 MG tablet  Take 10 mg by mouth daily.         .  clonazePAM (KLONOPIN) 1 MG tablet  Take 1 mg by mouth 3 (three) times daily.         [name]  doxazosin (CARDURA) 2 MG tablet   Take 2 mg by mouth daily.         .  vardenafil (LEVITRA) 20 MG tablet  Take 20 mg by mouth daily as needed.              No Known Allergies   Review of Systems  Constitutional: Negative for activity change, appetite change, fatigue and unexpected weight change.  HENT: Negative.   Eyes: Negative.   Respiratory: Negative.   Cardiovascular: Negative for chest pain and leg swelling.  Gastrointestinal: Negative.   Genitourinary: Negative.   Musculoskeletal: Negative.   Neurological: Negative.   Hematological: Negative.   Psychiatric/Behavioral: Negative.       BP  126/74  Pulse 83  Temp 98 F (36.7 C) (Oral)  Resp 18  Ht 5\' 7"  (1.702 m)  Wt 144 lb (65.318 kg)  BMI 22.55 kg/m2  SpO2 96% Physical Exam  Constitutional: He is oriented to person, place, and time. He appears well-developed and well-nourished.  HENT:   Head: Normocephalic and atraumatic.   Mouth/Throat: Oropharynx is clear and moist.  Eyes: Conjunctivae normal are normal. Pupils are equal, round, and reactive to light.  Neck: Normal range of motion. Neck supple. No JVD present. No thyromegaly present.  Cardiovascular: Normal rate, regular rhythm, normal heart sounds and intact distal pulses.  Exam reveals no gallop and no friction rub.    No murmur heard. Pulmonary/Chest: Effort normal and breath sounds normal. No respiratory distress. He has no rales.  Abdominal: Soft. Bowel sounds are normal. He exhibits no distension and no mass. There is no tenderness.  Musculoskeletal: Normal range of motion. He exhibits no edema.  Lymphadenopathy:    He has no cervical adenopathy.  Neurological: He is alert and oriented to person, place, and time. He has normal strength. No cranial nerve deficit or sensory deficit.  Skin: Skin is warm and dry.  Psychiatric: He has a normal mood and affect.        Diagnostic Tests:      *RADIOLOGY REPORT*    Clinical Data: Lung nodules bilaterally.  Ex-smoker.   CT CHEST  WITHOUT CONTRAST  10/17/11   Technique:  Multidetector CT imaging of the chest was performed following the standard protocol without IV contrast.   Comparison: Examinations dating back to 07/04/2007.   Findings: No pathologically enlarged mediastinal or axillary lymph nodes.  Hilar regions are difficult to definitively evaluate without IV contrast.  Heart size normal.  No pericardial effusion.   Emphysema.  Lobulated peripheral right lower lobe nodule measures 1.4 x 1.1 cm (previously 1.1 x 0.8 cm) on image 49.  A previously seen nodule in the adjacent right lower lobe has decreased in size and is now represented by wispy amorphous density (image 48). Additional scattered pulmonary nodules measure 4 mm or less in size and are unchanged.  Calcified granuloma along the right major fissure.  Scattered pulmonary parenchymal scarring and mild central bronchiectasis.  No pleural fluid.  Debris is seen in the lower trachea.  Airway is otherwise unremarkable.   Incidental imaging of the upper abdomen shows no acute findings. No worrisome lytic or sclerotic lesions.   IMPRESSION:   1.  Enlarging peripheral right lower lobe nodule is highly worrisome for primary bronchogenic carcinoma. Consultation with the [facility] [phone]) should be considered. These results will be called to the ordering clinician or representative by the Radiologist Assistant, and communication documented in the PACS Dashboard. 2.  Resolving adjacent right lower lobe nodule. 3.  Additional scattered tiny pulmonary nodules are unchanged.     Original Report Authenticated By: Reyes Ivan, M.D.                                                                         *RADIOLOGY REPORT*    Clinical Data: Initial treatment strategy for enlarging right lung nodule.   NUCLEAR MEDICINE PET SKULL BASE TO THIGH  10/29/11  Fasting Blood Glucose:  108     Technique:  17.0 mCi F-18 FDG was injected intravenously. CT data was obtained and used for attenuation correction and anatomic localization only.  (This was not acquired as a diagnostic CT examination.) Additional exam technical data entered on technologist worksheet.   Comparison:  Chest CT on 10/17/2011   Findings:   Neck: No hypermetabolic lymph nodes in the neck.   Chest:  No hypermetabolic mediastinal or hilar nodes.  A 1.4 cm nodule in the peripheral right lower lobe shows hypermetabolic activity, with a maximum SUV of 7.5.  No other hypermetabolic pulmonary nodules or masses are identified.   Abdomen/Pelvis:  No abnormal hypermetabolic activity within the liver, pancreas, adrenal glands, or spleen.  No hypermetabolic lymph nodes in the abdomen or pelvis.   Skeleton:  No focal hypermetabolic activity to suggest skeletal metastasis.   IMPRESSION:   1.  1.4 cm hypermetabolic pulmonary nodule in the peripheral right lower lobe, consistent with bronchogenic carcinoma. 2.  No evidence of hypermetabolic thoracic lymph nodes or distant metastatic disease.     Original Report Authenticated By: Danae Orleans, M.D.                                              Impression/Plan:   He has a 1.4 cm hypermetabolic pulmonary nodule in the peripheral right lower lobe that is consistent with a bronchogenic carcinoma. Although he is 78 years old he is in good physical condition and has remained active without any symptoms of coronary ischemia, despite having coronary bypass surgery in 1999. I think he is a surgical candidate for right thoracoscopy and wedge resection of this right lower lobe lung mass. I would not plan to perform lobectomy in this 78 year old patient. I discussed the operative procedure of wedge resection with him including the possible need for a small right thoracotomy incision. I discussed the benefits and risks including but not limited to bleeding, infection,  injury to the lung, prolonged air leak, a positive surgical margin requiring additional therapy, and recurrence of cancer. He understands all of this would like to proceed with surgery. We'll plan to do this on Wed, November 07, 2011.

## 2011-11-07 NOTE — Anesthesia Procedure Notes (Signed)
Procedure Name: Intubation Date/Time: 11/07/2011 8:42 AM Performed by: Arlice Colt B Pre-anesthesia Checklist: Patient identified, Emergency Drugs available, Suction available, Patient being monitored and Timeout performed Patient Re-evaluated:Patient Re-evaluated prior to inductionOxygen Delivery Method: Circle system utilized Preoxygenation: Pre-oxygenation with 100% oxygen Intubation Type: IV induction Ventilation: Mask ventilation without difficulty and Oral airway inserted - appropriate to patient size Laryngoscope Size: Mac and 3 Grade View: Grade I Tube type: Oral Tube size: 8.5 mm Number of attempts: 1 Airway Equipment and Method: Stylet Placement Confirmation: ETT inserted through vocal cords under direct vision,  positive ETCO2 and breath sounds checked- equal and bilateral Secured at: 22 cm Tube secured with: Tape Dental Injury: Teeth and Oropharynx as per pre-operative assessment

## 2011-11-08 ENCOUNTER — Inpatient Hospital Stay (HOSPITAL_COMMUNITY): Payer: Medicare Other

## 2011-11-08 ENCOUNTER — Encounter (HOSPITAL_COMMUNITY): Payer: Self-pay | Admitting: Surgery

## 2011-11-08 LAB — BASIC METABOLIC PANEL
BUN: 23 mg/dL (ref 6–23)
CO2: 27 mEq/L (ref 19–32)
Calcium: 8.7 mg/dL (ref 8.4–10.5)
Chloride: 101 mEq/L (ref 96–112)
Creatinine, Ser: 1.34 mg/dL (ref 0.50–1.35)
GFR calc Af Amer: 56 mL/min — ABNORMAL LOW (ref >90)
GFR calc non Af Amer: 48 mL/min — ABNORMAL LOW (ref >90)
Glucose, Bld: 132 mg/dL — ABNORMAL HIGH (ref 70–99)
Potassium: 4.1 mEq/L (ref 3.5–5.1)
Sodium: 136 mEq/L (ref 135–145)

## 2011-11-08 LAB — CBC
HCT: 31.2 % — ABNORMAL LOW (ref 39.0–52.0)
Hemoglobin: 10.1 g/dL — ABNORMAL LOW (ref 13.0–17.0)
MCH: 30.2 pg (ref 26.0–34.0)
MCHC: 32.4 g/dL (ref 30.0–36.0)
MCV: 93.4 fL (ref 78.0–100.0)
Platelets: 101 10*3/uL — ABNORMAL LOW (ref 150–400)
RBC: 3.34 MIL/uL — ABNORMAL LOW (ref 4.22–5.81)
RDW: 13.5 % (ref 11.5–15.5)
WBC: 5.7 10*3/uL (ref 4.0–10.5)

## 2011-11-08 LAB — GLUCOSE, CAPILLARY
Glucose-Capillary: 134 mg/dL — ABNORMAL HIGH (ref 70–99)
Glucose-Capillary: 107 mg/dL — ABNORMAL HIGH (ref 70–99)

## 2011-11-08 MED ORDER — PNEUMOCOCCAL VAC POLYVALENT 25 MCG/0.5ML IJ INJ: 0.5000 mL | INJECTION | INTRAMUSCULAR | Status: DC | PRN

## 2011-11-08 MED ORDER — INFLUENZA VIRUS VACC SPLIT PF IM SUSP
0.5000 mL | INTRAMUSCULAR | Status: DC | PRN
Start: 2011-11-08 — End: 2011-11-08

## 2011-11-08 MED ORDER — HALOPERIDOL LACTATE 5 MG/ML IJ SOLN
2.0000 mg | Freq: Once | INTRAMUSCULAR | Status: AC
  Administered 2011-11-08: 2 mg via INTRAVENOUS

## 2011-11-08 MED ORDER — KCL IN DEXTROSE-NACL 20-5-0.45 MEQ/L-%-% IV SOLN
INTRAVENOUS | Status: DC
  Administered 2011-11-08: 18:00:00 via INTRAVENOUS
  Filled 2011-11-08: qty 1000

## 2011-11-08 MED ORDER — HALOPERIDOL LACTATE 5 MG/ML IJ SOLN
INTRAMUSCULAR | Status: AC
  Filled 2011-11-08: qty 1

## 2011-11-08 NOTE — Op Note (Signed)
NAMESAYEED, Steven [name] NO.:  [id number]  MEDICAL RECORD NO.:  [id number]  LOCATION:  3314                         FACILITY:  [facility]  PHYSICIAN:  Evelene Croon, M.D.     DATE OF BIRTH:  [birth year]  DATE OF PROCEDURE:  11/07/2011 DATE OF DISCHARGE:                              OPERATIVE REPORT   PREOPERATIVE DIAGNOSIS:  Right lower lobe lung nodule.  POSTOPERATIVE DIAGNOSIS:  Right lower lobe lung nodule.  OPERATIVE PROCEDURE:  Flexible fiberoptic bronchoscopy, right video- assisted thoracoscopy, right thoracotomy with wedge resection of right lower lobe lung mass.  ATTENDING SURGEON:  Evelene Croon, MD  ASSISTANT:  Pauline Good, PA-C  ANESTHESIA:  General endotracheal.  CLINICAL HISTORY:  This patient is an 78 year old former smoker who has been followed for multiple small pulmonary nodules since 2009.  A CT scan of the chest in 2011 showed a new 4 mm nodule in the right lower lobe peripherally.  Subsequent CT scan showed increase in the size of this nodule and his most recent CT scan showed it to be 1.4 x 1.1 cm. The previously seen nodule adjacent to this in the right lower lobe has decreased in size.  Additional scattered pulmonary nodules measured 4 mm on the left side and were unchanged.  A PET scan showed hypermetabolic activity within this nodule with a maximum SUV of 7.5.  There were no other hypermetabolic areas on the scan.  Given the appearance on CT scan and PET scan, it was felt this was most likely a small lung cancer.  I felt the best option would be to proceed ahead with wedge resection of this lesion.  I did not feel the patient would be a candidate for lobectomy given his age and comorbidities.  I recommended surgical treatment with the patient and his family including the alternatives of external radiation therapy.  I discussed the benefits and risks of surgery including, but not limited to bleeding, blood transfusion, infection, prolonged  air leak from the lung, positive surgical margin, and recurrence of cancer.  He understood all this and agreed to proceed with surgery.  OPERATIVE PROCEDURE:  The patient was seen in the preoperative holding area and after reviewing the CT scan and chart, the right-sided chest was signed by me.  The consent was signed.  The patient was taken back to the operating room.  After induction of general endotracheal anesthesia using a single-lumen tube, flexible fiberoptic bronchoscopy was performed.  Time-out was taken.  Proper patient, proper operation were confirmed with nursing and anesthesia staff.  Then, flexible fiberoptic bronchoscopy was performed.  The distal trachea was normal. The main carina was sharp.  The right and left bronchial tree had normal segmental anatomy.  There were no endobronchial lesions and no sign of extrinsic compression.  There were no secretions.  The bronchoscope was withdrawn from the patient.  Then, the single-lumen tube was converted to a double-lumen tube by Anesthesiology.  A Foley catheter was placed in bladder using sterile technique.  Lower extremity pneumatic compression devices were used.  The patient was then turned in the left lateral decubitus position with the right side up.  The right side  of the chest was prepped with Betadine soap and solution, draped in usual sterile manner.  Time-out was taken.  Proper patient, proper operation, and proper operative site were confirmed with nursing and anesthesia staff after reviewing the scans in the operating room.  Then a 1 cm incision was made in the midaxillary line at about the 8th intercostal space.  Blunt dissection was continued down to the intercostal muscle and then the right pleural space was entered bluntly with a hemostat. The 10 mm trocar was inserted.  The 30-degree thoracoscope was used. Examination of the pleural space showed no visible abnormality.  The right lower lobe was examined closely  and I did not see any sign of the tumor on the surface of the lung.  There was no umbilication of the visceral pleura.  Therefore, a second 1 cm incision was made in the posterior axillary line about the 6th intercostal space and a 10 mm trocar was inserted through this incision.  The thoracoscope was moved over the disposition and then through the initial midaxillary incision, a spatula was inserted to palpate the right lower lobe.  I still could not clearly locate the right lower lobe lung mass with certainty and was certainly not visible on the surface.  Therefore I decided to perform a small lateral thoracotomy incision.  This was performed continuing anteriorly from the posterior axillary incision for total length of about 4 cm.  The muscles were divided using electrocautery.  The pleural space was entered through the 8th intercostal space.  The chest retractor was placed.  There was excellent visualization of the right lower lobe.  The lung was retracted to the incision and I was able to palpate the surface of the lung directly and was able to locate the lung mass in this manner.  Then, a generous wedge resection was performed around this lesion using linear staplers.  The specimen was sent to pathology for frozen section.  The pathologist Dr. Lavella Hammock called the operating room and said this is a non-small cell lung cancer and the margins were negative.  Then the staple liners were coated with CoSeal for hemostasis and sealing of any air leaks.  An On-Q catheter was inserted through separate stab incision inferiorly and posteriorly to the thoracotomy and passed in a subpleural location posterior to the thoracotomy incision so that the tip of the catheter was about 2 interspaces above the thoracotomy.  This was primed with 5 mL of 0.25% Marcaine solution.  Then, a 28-French chest tube was brought through the midaxillary incision and positioned posteriorly and up to the apex.  The lung was  reinflated under direct vision.  No visible air leaks were seen.  Then, the ribs were approximated with #2 Vicryl pericostal sutures.  The muscles were closed in layers using continuous #1 Vicryl suture.  Subcutaneous tissue was closed with continuous 2-0 Vicryl and skin with 3-0 Vicryl subcuticular closure.  The sponge, needle, and instrument counts were correct according to the scrub nurse.  Dry sterile dressing was applied over the incision and around the chest tube which was hooked to Pleur-Evac suction.  The patient remained hemodynamically stable and was then turned in the supine position, extubated, and transported to the post anesthesia care unit in satisfactory and stable condition.     Evelene Croon, M.D.     BB/MEDQ  D:  11/07/2011  T:  11/08/2011  Job:  [id number]

## 2011-11-08 NOTE — Progress Notes (Signed)
11/08/2011 3:10 PM Changed patients chest tube dressing twice today, draining serosanguinous fluid. Sub q air noted around chest tube site and around to his upper right back. VSS, pt is on 3L o2. Chest tube is to waterseal. Notified MD, no orders received. Will continue to monitor sub q air. Celesta Gentile

## 2011-11-08 NOTE — Progress Notes (Signed)
11/08/2011 7:21 PM Pt is extremely agitated and confused. Pulling at foley and chest tube. Placed safety mittens on patient. Paged md. Received order for one time dose of 2mg  haldol iv. Will give and monitor. Celesta Gentile

## 2011-11-08 NOTE — Progress Notes (Addendum)
[address]            [name] [address]          [phone]    1 Day Post-Op Procedure(s) (LRB): FLEXIBLE BRONCHOSCOPY (N/A) VIDEO ASSISTED THORACOSCOPY (VATS)/THOROCOTOMY (Right)  Subjective: Patient without complaints  Objective: Vital signs in last 24 hours: Patient Vitals for the past 24 hrs:  BP Temp Temp src Pulse Resp SpO2 Height Weight  11/08/11 0430 100/42 mmHg 97.6 F (36.4 C) Oral 80  15  95 % - -  11/08/11 0243 - - - - - 95 % - -  11/07/11 2359 101/41 mmHg 97.9 F (36.6 C) Oral 79  16  95 % - -  11/07/11 2029 87/57 mmHg 98.4 F (36.9 C) Oral 81  15  - - -  11/07/11 2010 - - - - - 94 % - -  11/07/11 1557 112/97 mmHg 97.9 F (36.6 C) Oral 79  12  96 % - -  11/07/11 1536 144/49 mmHg - - 77  16  100 % - -  11/07/11 1404 - 97.6 F (36.4 C) Oral 81  - 90 % - -  11/07/11 1324 - 98.6 F (37 C) - 70  17  - - -  11/07/11 1315 - - - 76  14  99 % 5\' 7"  (1.702 m) 148 lb 9.4 oz (67.4 kg)  11/07/11 1309 95/38 mmHg - - - - - - -  11/07/11 1300 - 97.5 F (36.4 C) - 75  13  97 % - -  11/07/11 1245 - - - 79  22  98 % - -  11/07/11 1240 - - - 72  13  98 % - -  11/07/11 1239 - - - - 19  - - -  11/07/11 1230 - 96.9 F (36.1 C) - 76  16  99 % - -  11/07/11 1222 147/59 mmHg - - - - - - -  11/07/11 1215 - 94.6 F (34.8 C) - 81  17  100 % - -  11/07/11 1200 - - - 79  15  100 % - -  11/07/11 1154 126/61 mmHg - - - - - - -  11/07/11 1145 - 94.5 F (34.7 C) - 78  13  100 % - -  11/07/11 1130 - - - 76  13  100 % - -  11/07/11 1126 130/54 mmHg - - - - - - -  11/07/11 1115 - 95.1 F (35.1 C) - 82  18  93 % - -  11/07/11 1112 - - - 81  - 88 % - -  11/07/11 1100 - - - 81  12  95 % - -  11/07/11 1057 100/45 mmHg - - - - - - -  11/07/11 1049 - 94.6 F (34.8 C) - 80  25  96 % - -     Intake/Output from previous day: 10/02 0701 - 10/03 0700 In: 1250 [I.V.:1250] Out: 650 [Urine:600; Blood:50]   Physical Exam:  Cardiovascular: RRR, no  murmurs, gallops, or rubs. Pulmonary: Slightly diminished at right base; no rales, wheezes, or rhonchi. Abdomen: Soft, non tender, bowel sounds present. Extremities: No lower extremity edema. Wounds: Clean and dry.  No erythema or signs of infection. Chest Tube: No air leak, minimal sero sanginous output  Lab Results: CBC: Basename 11/08/11 0425 11/05/11 1500  WBC 5.7  4.7  HGB 10.1* 11.9*  HCT 31.2* 36.5*  PLT 101* 120*   BMET:  Basename 11/08/11 0425 11/05/11 1500  NA 136 139  K 4.1 4.0  CL 101 106  CO2 27 24  GLUCOSE 132* 113*  BUN 23 20  CREATININE 1.34 1.13  CALCIUM 8.7 9.6    PT/INR:  Basename 11/05/11 1500  LABPROT 13.2  INR 1.01   ABG:  INR: Will add last result for INR, ABG once components are confirmed Will add last 4 CBG results once components are confirmed  Assessment/Plan:  1. CV - SR 2.  Pulmonary - Chest tube with no air leak and minimal output. Possibly place to water seal. CXR this am shows patient is rotated to the right, bibasilar atelectasis, no pneumothorax. Encourage incentive spirometer and flutter valve. 3.Remove a line 4. Decrease IVF, advance diet as tolerates. 5.CBGs 194/85/134.Will stop accu checks after lunch as has no history of diabetes  ZIMMERMAN,DONIELLE MPA-C 11/08/2011,7:47 AM    Chart reviewed, patient examined, agree with above.

## 2011-11-09 ENCOUNTER — Inpatient Hospital Stay (HOSPITAL_COMMUNITY): Payer: Medicare Other

## 2011-11-09 MED ORDER — HALOPERIDOL LACTATE 5 MG/ML IJ SOLN
2.0000 mg | Freq: Once | INTRAMUSCULAR | Status: AC
  Administered 2011-11-09: 2 mg via INTRAVENOUS

## 2011-11-09 MED ORDER — HALOPERIDOL LACTATE 5 MG/ML IJ SOLN
INTRAMUSCULAR | Status: AC
  Administered 2011-11-09: 2 mg via INTRAVENOUS
  Filled 2011-11-09: qty 1

## 2011-11-09 MED ORDER — SODIUM CHLORIDE 0.9 % IJ SOLN
INTRAMUSCULAR | Status: AC
  Administered 2011-11-09: 11:00:00
  Filled 2011-11-09: qty 10

## 2011-11-09 NOTE — Progress Notes (Signed)
Pt CT d/c per MD order, pt confused at current baseline after OR, pt cont to attempt to get OOB, pulling at monitoring lines, pt pulled at IJ line, MD called during time frame, new orders received, pt restrained see order

## 2011-11-09 NOTE — Progress Notes (Addendum)
2 Days Post-Op Procedure(s) (LRB): FLEXIBLE BRONCHOSCOPY (N/A) VIDEO ASSISTED THORACOSCOPY (VATS)/THOROCOTOMY (Right) Subjective:  Steven [name] developed confusion last night.  He seems better this morning.  He does complain of not being able to cough "stuff" up.  Objective: Vital signs in last 24 hours: Temp:  [97.7 F (36.5 C)-98.8 F (37.1 C)] 97.7 F (36.5 C) (10/04 0421) Pulse Rate:  [72-94] 90  (10/04 0421) Cardiac Rhythm:  [-] Normal sinus rhythm (10/04 0421) Resp:  [10-20] 13  (10/04 0421) BP: (108-144)/(36-46) 131/43 mmHg (10/04 0421) SpO2:  [93 %-96 %] 96 % (10/04 0421)  Intake/Output from previous day: 10/03 0701 - 10/04 0700 In: 300 [I.V.:300] Out: 1990 [Urine:1850; Chest Tube:140]  General appearance: alert, cooperative and no distress Heart: regular rate and rhythm Lungs: diminished breath sounds bilaterally Abdomen: soft, non-tender; bowel sounds normal; no masses,  no organomegaly Extremities: extremities normal, atraumatic, no cyanosis or edema Wound: clean, serosanquinous drainge present on dressing, sub Q air present around chest tube site  Lab Results:  [facility] 11/08/11 0425  WBC 5.7  HGB 10.1*  HCT 31.2*  PLT 101*   BMET:  Basename 11/08/11 0425  NA 136  K 4.1  CL 101  CO2 27  GLUCOSE 132*  BUN 23  CREATININE 1.34  CALCIUM 8.7    PT/INR: No results found for this basename: LABPROT,INR in the last 72 hours ABG    Component Value Date/Time   PHART 7.415 11/05/2011 1500   HCO3 26.0* 11/05/2011 1500   TCO2 27.3 11/05/2011 1500   ACIDBASEDEF 1.9 07/05/2007 0600   O2SAT 96.6 11/05/2011 1500   CBG (last 3)   Basename 11/08/11 0803 11/08/11 0420 11/07/11 2358  GLUCAP 107* 134* 85    Assessment/Plan: S/P Procedure(s) (LRB): FLEXIBLE BRONCHOSCOPY (N/A) VIDEO ASSISTED THORACOSCOPY (VATS)/THOROCOTOMY (Right)  1. Chest tube in place- minimal drainage, no air leak, CXR shows improvement of basilar pneumothorax, new collection of sub q air at  chest tube insertion + atelectasis 2. Non- Small Cell Lung Cancer 3. Pulmonary- difficulty expectorating sputum, on oxygen will continue nebulizer treatment, continue IS 4. Confusion- most likely "sundowning" will follow 5. Dispo- chest tube with no air leak, will leave in place for now with sub q air developing, D/C IVF    LOS: 2 days    BARRETT, ERIN 11/09/2011  CXR looks ok and there is no air leak. Will DC chest tube. Transfer to [facility] [employer].

## 2011-11-10 ENCOUNTER — Inpatient Hospital Stay (HOSPITAL_COMMUNITY): Payer: Medicare Other

## 2011-11-10 MED ORDER — WHITE PETROLATUM GEL
Status: AC
  Administered 2011-11-10: 0.2
  Filled 2011-11-10: qty 5

## 2011-11-10 NOTE — Progress Notes (Addendum)
                    [address]            [name] [address]          [phone]     3 Days Post-Op Procedure(s) (LRB): FLEXIBLE BRONCHOSCOPY (N/A) VIDEO ASSISTED THORACOSCOPY (VATS)/THOROCOTOMY (Right)  Subjective: OOB in chair.  Confused yesterday, agitated and pulling at lines, requiring Haldol prn and restraints.  Appears calm this am. Still with some confusion, but answers most questions appropriately.  C/o thick congestion in chest.   Objective: Vital signs in last 24 hours: Patient Vitals for the past 24 hrs:  BP Temp Temp src Pulse Resp SpO2  11/10/11 0705 - 98.7 F (37.1 C) Oral - - -  11/10/11 0400 115/52 mmHg 98.9 F (37.2 C) Oral 90  15  95 %  11/10/11 0134 - - - - - 100 %  11/10/11 0000 101/53 mmHg 98.6 F (37 C) Oral 78  14  100 %  11/09/11 2004 - - - - - 100 %  11/09/11 2000 119/66 mmHg 99.7 F (37.6 C) Oral 93  20  99 %  11/09/11 1539 109/57 mmHg 98.3 F (36.8 C) Oral - - 100 %  11/09/11 1418 - - - - - 100 %  11/09/11 1211 - 98.2 F (36.8 C) Oral - - -  11/09/11 1158 126/70 mmHg - - - - -  11/09/11 0914 - - - - - 90 %  11/09/11 0756 128/69 mmHg 98 F (36.7 C) Oral 87  19  97 %   Current Weight  11/07/11 148 lb 9.4 oz (67.4 kg)     Intake/Output from previous day: 10/04 0701 - 10/05 0700 In: 240 [P.O.:240] Out: 1800 [Urine:1800]    PHYSICAL EXAM:  Heart: RRR Lungs: decreased BS in bases bilaterally Wound:Clean and dry     Lab Results: CBC: Basename 11/08/11 0425  WBC 5.7  HGB 10.1*  HCT 31.2*  PLT 101*   BMET:  Basename 11/08/11 0425  NA 136  K 4.1  CL 101  CO2 27  GLUCOSE 132*  BUN 23  CREATININE 1.34  CALCIUM 8.7    PT/INR: No results found for this basename: LABPROT,INR in the last 72 hours  CXR: Findings: Trace pneumothorax suspected at the right apex of less  than 5%. Postoperative atelectasis of the right lower lung  present. No edema or focal airspace consolidation. Central line  positioning  stable. Heart size is normal. No pleural effusions  identified.  IMPRESSION:  Tiny right apical pneumothorax and right lower lung atelectasis  present postoperatively.    Assessment/Plan: S/P Procedure(s) (LRB): FLEXIBLE BRONCHOSCOPY (N/A) VIDEO ASSISTED THORACOSCOPY (VATS)/THOROCOTOMY (Right) Pulm stable.  Tiny ptx on CXR.  Continue pulm toilet, IS. Repeat CXR in am. Confusion- improved this am.  Will monitor. Continue prn Haldol. Needs to mobilize.   LOS: 3 days    COLLINS,GINA H 11/10/2011 I have seen and examined Steven [name] and agree with the above assessment  and plan.  Delight Ovens MD Beeper [phone] Office [phone] 11/10/2011 11:03 AM

## 2011-11-11 ENCOUNTER — Inpatient Hospital Stay (HOSPITAL_COMMUNITY): Payer: Medicare Other

## 2011-11-11 MED ORDER — HALOPERIDOL LACTATE 5 MG/ML IJ SOLN: 2.0000 mg | Freq: Once | INTRAMUSCULAR | Status: AC

## 2011-11-11 MED ORDER — HALOPERIDOL LACTATE 5 MG/ML IJ SOLN
2.0000 mg | Freq: Four times a day (QID) | INTRAMUSCULAR | Status: DC | PRN
  Administered 2011-11-11 (×2): 2 mg via INTRAVENOUS
  Filled 2011-11-11 (×3): qty 1

## 2011-11-11 MED ORDER — HALOPERIDOL 2 MG PO TABS
2.0000 mg | ORAL_TABLET | Freq: Once | ORAL | Status: AC
  Administered 2011-11-11: 2 mg via ORAL
  Filled 2011-11-11: qty 1

## 2011-11-11 NOTE — Progress Notes (Addendum)
                    [address]            [place] [address]          [phone]     4 Days Post-Op Procedure(s) (LRB): FLEXIBLE BRONCHOSCOPY (N/A) VIDEO ASSISTED THORACOSCOPY (VATS)/THOROCOTOMY (Right)  Subjective: Still quite confused, agitated at times, pulling at lines, etc. Breathing stable.   Objective: Vital signs in last 24 hours: Patient Vitals for the past 24 hrs:  BP Temp Temp src Pulse SpO2  11/11/11 0710 161/68 mmHg - - - 96 %  11/11/11 0700 - 98.3 F (36.8 C) Oral - -  11/11/11 0425 134/70 mmHg 97.8 F (36.6 C) Oral 88  95 %  11/11/11 0110 - - - - 95 %  11/11/11 0000 114/60 mmHg 98 F (36.7 C) Oral 85  99 %  11/10/11 2047 114/66 mmHg 98.6 F (37 C) Oral 95  94 %  11/10/11 1525 148/91 mmHg 98.4 F (36.9 C) Oral 87  95 %  11/10/11 1437 - - - - 97 %  11/10/11 1127 104/44 mmHg 97.9 F (36.6 C) Oral 85  -  11/10/11 0916 - - - - 95 %   Current Weight  11/07/11 148 lb 9.4 oz (67.4 kg)     Intake/Output from previous day: 10/05 0701 - 10/06 0700 In: 240 [P.O.:240] Out: 1150 [Urine:1150]    PHYSICAL EXAM:  Heart: RRR Lungs:few coarse BS in bases Wound:Clean and dry     Lab Results: CBC:No results found for this basename: WBC:2,HGB:2,HCT:2,PLT:2 in the last 72 hours BMET: No results found for this basename: NA:2,K:2,CL:2,CO2:2,GLUCOSE:2,BUN:2,CREATININE:2,CALCIUM:2 in the last 72 hours  PT/INR: No results found for this basename: LABPROT,INR in the last 72 hours  CXR: stable   Assessment/Plan: S/P Procedure(s) (LRB): FLEXIBLE BRONCHOSCOPY (N/A) VIDEO ASSISTED THORACOSCOPY (VATS)/THOROCOTOMY (Right) Pulm- continue IS/Pulm toilet. Confusion/delirium- Pt continue to have episodes of agitation, confusion. Back on home dose of Klonopin.  Will d/c narcotics, order prn Haldol for agitation. Watch MS closely.   LOS: 4 days    COLLINS,GINA H 11/11/2011   I have seen and examined Abran Duke Steier and agree with the above  assessment  and plan.  Delight Ovens MD Beeper [phone] Office [phone] 11/11/2011 9:28 AM

## 2011-11-11 NOTE — Progress Notes (Signed)
Pt became verbally abusive, combative, and refusing to stay ing bed, MD/N meds given, see MAR, pt currently in restraints with sitter, cont to be confused and agitated, HR 120's, BP 140/90.

## 2011-11-12 MED ORDER — SODIUM CHLORIDE 0.9 % IJ SOLN
INTRAMUSCULAR | Status: AC
  Administered 2011-11-12: 20 mL
  Filled 2011-11-12: qty 20

## 2011-11-12 NOTE — Progress Notes (Signed)
Clinical Child psychotherapist (CSW) received a referral for SNF placement. CSW awaiting PT/OT evaluations to determine disposition recommendations.   Full assessment to follow once appropriate recommendations made.  Theresia Bough, MSW, Theresia Majors [phone]

## 2011-11-12 NOTE — Progress Notes (Signed)
Utilization review completed.  

## 2011-11-12 NOTE — Evaluation (Signed)
Physical Therapy Evaluation Patient Details Name: Steven [name] MRN: [id number] DOB: 1928-03-02 Today's Date: 11/12/2011 Time: 2130-8657 PT Time Calculation (min): 39 min  PT Assessment / Plan / Recommendation Clinical Impression  Pt admitted with RLL nodule s/p bronchoscopy and wedge resection with AMS post op. Pt pleasantly confused and per pt has no caregiver at home. Pt with mobility limitations and largely impaired by cognition. Pt will need 24hr assist at discharge secondary to decreased mobility and cognition. Will follow acutely to maximize mobility, transfers, gait and cognition.     PT Assessment  Patient needs continued PT services    Follow Up Recommendations  Supervision/Assistance - 24 hour;Post acute inpatient    Does the patient have the potential to tolerate intense rehabilitation   No, Recommend SNF  Barriers to Discharge Decreased caregiver support      Equipment Recommendations  Rolling walker with 5" wheels    Recommendations for Other Services OT consult   Frequency Min 3X/week    Precautions / Restrictions Precautions Precautions: Fall Precaution Comments: oxygen   Pertinent Vitals/Pain No pain sats 90-92 on 3L throughout      Mobility  Bed Mobility Bed Mobility: Supine to Sit Supine to Sit: HOB flat;With rails;4: Min assist Details for Bed Mobility Assistance: cueing for sequence to initiate and attend to task with assist to bring trunk off surface Transfers Transfers: Sit to Stand;Stand to Sit Sit to Stand: 4: Min assist;From bed Stand to Sit: 4: Min guard;To chair/3-in-1 Details for Transfer Assistance: cueing for hand placement, sequence and safety Ambulation/Gait Ambulation/Gait Assistance: 4: Min assist Ambulation Distance (Feet): 120 Feet Assistive device: Rolling walker Ambulation/Gait Assistance Details: constant cueing for position in RW, directional cues and safety with assist with turns Gait Pattern: Step-through pattern;Decreased  stride length;Trunk flexed;Narrow base of support Gait velocity: decreased with frequent stops due to pt internally distracted Stairs: No    Shoulder Instructions     Exercises     PT Diagnosis: Difficulty walking;Altered mental status  PT Problem List: Decreased cognition;Decreased activity tolerance;Decreased balance;Decreased mobility;Decreased safety awareness;Decreased knowledge of precautions;Decreased knowledge of use of DME PT Treatment Interventions: Gait training;DME instruction;Functional mobility training;Therapeutic activities;Therapeutic exercise;Balance training;Patient/family education;Cognitive remediation   PT Goals Acute Rehab PT Goals PT Goal Formulation: With patient Time For Goal Achievement: 11/26/11 Potential to Achieve Goals: Fair Pt will go Supine/Side to Sit: with supervision;with HOB 0 degrees PT Goal: Supine/Side to Sit - Progress: Goal set today Pt will go Sit to Supine/Side: with supervision;with HOB 0 degrees PT Goal: Sit to Supine/Side - Progress: Goal set today Pt will go Sit to Stand: with supervision PT Goal: Sit to Stand - Progress: Goal set today Pt will go Stand to Sit: with supervision PT Goal: Stand to Sit - Progress: Goal set today Pt will Ambulate: with supervision;51 - 150 feet;with least restrictive assistive device PT Goal: Ambulate - Progress: Goal set today  Visit Information  Last PT Received On: 11/12/11 Assistance Needed: +1    Subjective Data  Subjective: I've been confused, is that right? Patient Stated Goal: I'd like to go home   Prior Functioning  Home Living Lives With: Alone Type of Home: Apartment Home Access: Level entry Home Layout: One level Home Adaptive Equipment: None Prior Function Level of Independence: Independent Driving: Yes Comments: PLOF per pt which is unclear how accurate due to cognitive deficits currently. Pt states he lives alone and did everything for himself including going to the grocery store  but doesn't cook and can't state  how he would get meals.  Communication Communication: No difficulties    Cognition  Overall Cognitive Status: Impaired Area of Impairment: Attention;Memory;Safety/judgement;Problem solving Arousal/Alertness: Awake/alert Orientation Level: Disoriented to;Situation;Time Behavior During Session: [facility] for tasks performed Current Attention Level: Focused Memory Deficits: Pt unable to recall home environment Safety/Judgement: Decreased safety judgement for tasks assessed;Decreased awareness of need for assistance Problem Solving: pt unable to solve simple problems like what to do if he needs to go to the bathroom    Extremity/Trunk Assessment Right Lower Extremity Assessment RLE ROM/Strength/Tone: [facility] for tasks assessed Left Lower Extremity Assessment LLE ROM/Strength/Tone: [facility] for tasks assessed Trunk Assessment Trunk Assessment: Normal   Balance    End of Session PT - End of Session Equipment Utilized During Treatment: Gait belt;Oxygen Activity Tolerance: Patient tolerated treatment well Patient left: in chair;with call bell/phone within reach;with chair alarm set;with restraints reapplied (posey belt) Nurse Communication: Mobility status  GP     Toney Sang Beth 11/12/2011, 3:42 PM  Delaney Meigs, PT [phone]

## 2011-11-12 NOTE — Progress Notes (Signed)
5 Days Post-Op Procedure(s) (LRB): FLEXIBLE BRONCHOSCOPY (N/A) VIDEO ASSISTED THORACOSCOPY (VATS)/THOROCOTOMY (Right) Subjective: No complaints Has been confused with some periods of agitation. Received some Haldol overnight and slept well  Objective: Vital signs in last 24 hours: Temp:  [97.3 F (36.3 C)-99.6 F (37.6 C)] 98.5 F (36.9 C) (10/07 0711) Pulse Rate:  [98-118] 98  (10/07 0450) Cardiac Rhythm:  [-] Normal sinus rhythm (10/07 0450) Resp:  [18-20] 18  (10/07 0450) BP: (99-144)/(58-93) 99/58 mmHg (10/07 0450) SpO2:  [92 %-97 %] 94 % (10/07 0450)  Hemodynamic parameters for last 24 hours:    Intake/Output from previous day: 10/06 0701 - 10/07 0700 In: 480 [P.O.:480] Out: 1325 [Urine:1325] Intake/Output this shift:    General appearance: alert and cooperative. Confused but coopertive. Feeding himself breakfast Heart: regular rate and rhythm, S1, S2 normal, no murmur, click, rub or gallop Lungs: clear to auscultation bilaterally  Lab Results: No results found for this basename: WBC:2,HGB:2,HCT:2,PLT:2 in the last 72 hours BMET: No results found for this basename: NA:2,[name]:2,CL:2,CO2:2,GLUCOSE:2,BUN:2,CREATININE:2,CALCIUM:2 in the last 72 hours  PT/INR: No results found for this basename: LABPROT,INR in the last 72 hours ABG    Component Value Date/Time   PHART 7.415 11/05/2011 1500   HCO3 26.0* 11/05/2011 1500   TCO2 27.3 11/05/2011 1500   ACIDBASEDEF 1.9 07/05/2007 0600   O2SAT 96.6 11/05/2011 1500   CBG (last 3)  No results found for this basename: GLUCAP:3 in the last 72 hours  Pathology:  Poorly-diff squamous cell carcinoma with negative resection margins.  Assessment/Plan: S/P Procedure(s) (LRB): FLEXIBLE BRONCHOSCOPY (N/A) VIDEO ASSISTED THORACOSCOPY (VATS)/THOROCOTOMY (Right)  He is doing well from a surgical perspective but has postop delirium. He was on tid Klonopin at home which was restarted postop. He probably has some baseline age-related dementia  which usually results in some postop delirium. Continue supportive care and this should improve. Will ask social work to consult because I think he lives alone and will likely need SNF placement.  LOS: 5 days    Steven [name],Steven [name] 11/12/2011

## 2011-11-13 DIAGNOSIS — C3490 Malignant neoplasm of unspecified part of unspecified bronchus or lung: Secondary | ICD-10-CM

## 2011-11-13 DIAGNOSIS — Z9889 Other specified postprocedural states: Secondary | ICD-10-CM

## 2011-11-13 MED ORDER — TRAMADOL HCL 50 MG PO TABS: 50.0000 mg | ORAL_TABLET | Freq: Four times a day (QID) | ORAL | Status: DC | PRN

## 2011-11-13 MED ORDER — WHITE PETROLATUM GEL
Status: AC
  Filled 2011-11-13: qty 5

## 2011-11-13 MED ORDER — HYDROCOD POLST-CHLORPHEN POLST 10-8 MG/5ML PO LQCR
5.0000 mL | Freq: Two times a day (BID) | ORAL | Status: DC | PRN
  Administered 2011-11-13: 5 mL via ORAL
  Filled 2011-11-13: qty 5

## 2011-11-13 MED ORDER — ENOXAPARIN SODIUM 40 MG/0.4ML ~~LOC~~ SOLN
40.0000 mg | SUBCUTANEOUS | Status: DC
Start: 2011-11-13 — End: 2011-11-13
  Filled 2011-11-13 (×2): qty 0.4

## 2011-11-13 NOTE — Clinical Social Work Psychosocial (Signed)
     Clinical Social Work Department BRIEF PSYCHOSOCIAL ASSESSMENT 11/13/2011  Patient:  Steven [name], Steven [name]     Account Number:  [id number]     Admit date:  11/07/2011  Clinical Social Worker:  Lourdes Sledge  Date/Time:  11/13/2011 10:26 AM  Referred by:  Physician  Date Referred:  11/13/2011 Referred for  SNF Placement   Other Referral:   Interview type:  Family Other interview type:   CSW completed assessment with pt son Steven [name] [phone]. CSW left a message for pt daughter Steven [name] [phone].    PSYCHOSOCIAL DATA Living Status:  ALONE Admitted from facility:   Level of care:   Primary support name:  Steven [name] [phone]. Primary support relationship to patient:  CHILD, ADULT Degree of support available:   Pt lives alone however family reports he has significant help from other family that live nearby.    CURRENT CONCERNS Current Concerns  Post-Acute Placement   Other Concerns:    SOCIAL WORK ASSESSMENT / PLAN CSW received a referral for SNF placement.    CSW observed that pt presents confused and is unable to complete assessment. CSW contacted pt son Steven [name] [phone] after leaving a message for pt daughter. CSW informed pt son of PT recommendations for SNF placement.    CSW received consent to do a SNF search, submit for a pasrr and submit pt clinicals to [employer].   Assessment/plan status:  Psychosocial Support/Ongoing Assessment of Needs Other assessment/ plan:   Information/referral to community resources:   CSW submitted pt clinicals to [facility] for authorization. Pt family currently not in the room, family not in need of SNF list as they have requested placement in [place] only.    PATIENTS/FAMILYS RESPONSE TO PLAN OF CARE: Pt presents with some confusion.    Assessment completed with pt son agreeable that pt is in need of rehab before returning home. Pt son requests placement in [place], [place] where pt and family live. Placement has  been offered and accepted at [facility] in [place], [place]. CSW will facilitate with dc when pt is stable.

## 2011-11-13 NOTE — Progress Notes (Signed)
Physical Therapy Treatment Patient Details Name: LANDO ALCALDE MRN: [id number] DOB: 11/20/1928 Today's Date: 11/13/2011 Time: 1026-1050 PT Time Calculation (min): 24 min  PT Assessment / Plan / Recommendation Comments on Treatment Session  Pt admitted with RLL nodule s/p bronchoscopy and wedge resection. Pt remains confused but more appropriate and slightly less distracted today. Pt progressing with mobility and required assist for self care due to leaking condom cath. Will continue to follow    Follow Up Recommendations        Does the patient have the potential to tolerate intense rehabilitation     Barriers to Discharge        Equipment Recommendations  Rolling walker with 5" wheels    Recommendations for Other Services    Frequency     Plan Discharge plan remains appropriate;Frequency remains appropriate    Precautions / Restrictions Precautions Precautions: Fall Precaution Comments: no Restrictions Weight Bearing Restrictions: No   Pertinent Vitals/Pain No pain sats 95% on RA   Mobility  Bed Mobility Bed Mobility: Sit to Supine Sit to Supine: 4: Min guard;HOB flat Details for Bed Mobility Assistance: cueing for sequence and guarding for safety Transfers Sit to Stand: 4: Min guard;From chair/3-in-1;From toilet Stand to Sit: To toilet;To bed Details for Transfer Assistance: cueing for hand placement, safety and sequence Ambulation/Gait Ambulation/Gait Assistance: 4: Min assist Ambulation Distance (Feet): 300 Feet Assistive device: Rolling walker Ambulation/Gait Assistance Details: constant cueing for RW use, posture and safety as well as directional cues to be able to return to room Gait Pattern: Step-through pattern;Decreased stride length;Trunk flexed Gait velocity: decreased but less stops due to distraction today    Exercises     PT Diagnosis:    PT Problem List:   PT Treatment Interventions:     PT Goals Acute Rehab PT Goals PT Goal: Sit to  Supine/Side - Progress: Progressing toward goal PT Goal: Sit to Stand - Progress: Progressing toward goal PT Goal: Stand to Sit - Progress: Progressing toward goal PT Goal: Ambulate - Progress: Progressing toward goal  Visit Information  Last PT Received On: 11/13/11 Assistance Needed: +1    Subjective Data  Subjective: "I dont' know how i got here cause I didn't go to the doctor"   Cognition  Overall Cognitive Status: Impaired Area of Impairment: Attention;Memory;Following commands;Safety/judgement Arousal/Alertness: Awake/alert Orientation Level: Appears intact for tasks assessed Behavior During Session: [facility] for tasks performed Current Attention Level: Selective Memory: Decreased recall of precautions Memory Deficits: constant cueing for safe use of RW, decreased STM Safety/Judgement: Decreased safety judgement for tasks assessed;Decreased awareness of need for assistance Problem Solving: pt unable to reposition self in RW after getting turned around at sink; attempted to put denture cleaner tablet in dentures as Polygrip    Balance     End of Session PT - End of Session Equipment Utilized During Treatment: Gait belt Activity Tolerance: Patient tolerated treatment well Patient left: in bed;with bed alarm set Nurse Communication: Mobility status   GP     Toney Sang Beth 11/13/2011, 10:55 AM Delaney Meigs, PT [phone]

## 2011-11-13 NOTE — Care Management Note (Signed)
    Page 1 of 1   11/13/2011     4:05:58 PM   CARE MANAGEMENT NOTE 11/13/2011  Patient:  Steven [name], Steven [name]   Account Number:  [id number]  Date Initiated:  11/12/2011  Documentation initiated by:  Donn Pierini  Subjective/Objective Assessment:   Pt s/p VATS/THOROCOTOMY     Action/Plan:   PTA pt lived at home alone, PT/OT evals ordered -   Anticipated DC Date:  11/14/2011   Anticipated DC Plan:  SKILLED NURSING FACILITY  In-house referral  Clinical Social Worker      DC Planning Services  CM consult      Choice offered to / List presented to:             Status of service:  Completed, signed off Medicare Important Message given?   (If response is "NO", the following Medicare IM given date fields will be blank) Date Medicare IM given:   Date Additional Medicare IM given:    Discharge Disposition:  SKILLED NURSING FACILITY  Per UR Regulation:  Reviewed for med. necessity/level of care/duration of stay  If discussed at Long Length of Stay Meetings, dates discussed:   11/13/2011    Comments:  PCP- Helene Kelp C  11/13/11- 1200- Donn Pierini RN, BSN [phone] Per CSW pt can go to SNF today, unit RN aware- pt to d/c later this afternoon to  SNF.  11/12/11- 1530- Donn Pierini RN, BSN [phone] Awaiting PT/OT evals- pt most likely will need ST- SNF - CSW following for potential placement. NCM to follow along for d/c planning
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
## 2011-11-13 NOTE — Evaluation (Signed)
Occupational Therapy Evaluation Patient Details Name: Steven [name] MRN: [id number] DOB: 07/26/1928 Today's Date: 11/13/2011 Time: 4098-1191 OT Time Calculation (min): 20 min  OT Assessment / Plan / Recommendation Clinical Impression  Pt admitted with RLL nodule s/p bronchoscopy and wedge resection with AMS post op. Pt with balance deficits but currently most limited by cognition/safety. Recommend 24hr assist at this time and pt states he lives alone. Will need SNF at d/c    OT Assessment  Patient needs continued OT Services    Follow Up Recommendations  Skilled nursing facility;Supervision/Assistance - 24 hour    Barriers to Discharge Decreased caregiver support    Equipment Recommendations  Rolling walker with 5" wheels    Recommendations for Other Services    Frequency  Min 2X/week    Precautions / Restrictions Precautions Precautions: Fall Restrictions Weight Bearing Restrictions: No   Pertinent Vitals/Pain Pt denies any pain    ADL  Eating/Feeding: Performed;Independent Where Assessed - Eating/Feeding: Chair Grooming: Performed;Minimal assistance Where Assessed - Grooming: Supported standing Lower Body Bathing: Simulated;Minimal assistance Where Assessed - Lower Body Bathing: Supported sit to stand Lower Body Dressing: Performed;Min guard Where Assessed - Lower Body Dressing: Supported sit to stand Toilet Transfer: Counsellor Method: Sit to Barista: Materials engineer and Hygiene: Simulated;Min guard Where Assessed - Engineer, mining and Hygiene: Standing Equipment Used: Gait belt;Rolling walker Transfers/Ambulation Related to ADLs: Pt required VC to position self correctly in RW after getting turned around at sink. Min guard A with RW ambulation throughout room    OT Diagnosis: Generalized weakness;Cognitive deficits;Altered mental status  OT Problem List: Decreased  activity tolerance;Impaired balance (sitting and/or standing);Decreased cognition;Decreased safety awareness;Decreased knowledge of use of DME or AE;Decreased knowledge of precautions;Cardiopulmonary status limiting activity OT Treatment Interventions: Self-care/ADL training;DME and/or AE instruction;Therapeutic activities;Patient/family education;Balance training;Cognitive remediation/compensation   OT Goals Acute Rehab OT Goals OT Goal Formulation: With patient Time For Goal Achievement: 11/27/11 Potential to Achieve Goals: Good ADL Goals Pt Will Perform Grooming: with modified independence;Standing at sink ADL Goal: Grooming - Progress: Goal set today Pt Will Perform Upper Body Dressing: Independently;Sitting, bed;Sitting, chair ADL Goal: Upper Body Dressing - Progress: Goal set today Pt Will Perform Lower Body Dressing: Sit to stand from bed;Sit to stand from chair;Independently ADL Goal: Lower Body Dressing - Progress: Goal set today Pt Will Transfer to Toilet: with modified independence;Ambulation;with DME ADL Goal: Toilet Transfer - Progress: Goal set today Pt Will Perform Toileting - Clothing Manipulation: Independently;Standing ADL Goal: Toileting - Clothing Manipulation - Progress: Goal set today Pt Will Perform Toileting - Hygiene: Independently;Sitting on 3-in-1 or toilet ADL Goal: Toileting - Hygiene - Progress: Goal set today Additional ADL Goal #1: Pt will sequencing through 10 step or less functional activities with Min VC ADL Goal: Additional Goal #1 - Progress: Goal set today  Visit Information  Last OT Received On: 11/13/11 Assistance Needed: +1    Subjective Data  Subjective: I really wish I had that Poly-grip Patient Stated Goal: Return home   Prior Functioning     Home Living Lives With: Alone Type of Home: Apartment Home Access: Level entry Home Layout: One level Home Adaptive Equipment: None Prior Function Level of Independence: Independent Driving:  Yes Comments: Pt unable to provide clear PLOF, but is currently unsafe to return to any environment without 24hr supervision available Communication Communication: No difficulties Dominant Hand: Right         Vision/Perception  Cognition  Arousal/Alertness: Awake/alert Orientation Level: Oriented X4 / Intact Behavior During Session: WFL for tasks performed Current Attention Level: Selective Safety/Judgement: Decreased safety judgement for tasks assessed Problem Solving: pt unable to reposition self in RW after getting turned around at sink; attempted to put denture cleaner tablet in dentures as Polygrip    Extremity/Trunk Assessment Right Upper Extremity Assessment RUE ROM/Strength/Tone: WFL for tasks assessed RUE Sensation: WFL - Light Touch RUE Coordination: WFL - gross/fine motor Left Upper Extremity Assessment LUE ROM/Strength/Tone: WFL for tasks assessed LUE Sensation: WFL - Light Touch LUE Coordination: WFL - gross/fine motor     Mobility Transfers Sit to Stand: 4: Min guard;With armrests;From chair/3-in-1 Stand to Sit: 4: Min guard;With armrests;To chair/3-in-1 Details for Transfer Assistance: cueing for hand placement and safety     Shoulder Instructions     Exercise     Balance     End of Session OT - End of Session Equipment Utilized During Treatment: Gait belt Activity Tolerance: Patient tolerated treatment well Patient left: in chair;with call bell/phone within reach;with chair alarm set Nurse Communication: Mobility status  GO     [name] 11/13/2011, 9:17 AM

## 2011-11-13 NOTE — Progress Notes (Signed)
Clinical Child psychotherapist (CSW) prepared and placed pt dc packet in pt shadow chart. MD faxed pt FL2 to facility,  Tresa Endo with [facility] confirmed receipt of FL2. CSW received [facility] Auth for transport (authorization # [id number]). Pt family aware and agreeable and in route to complete paperwork at facility. CSW calling for transport, CSW signing off.  Theresia Bough, MSW, Theresia Majors [phone]

## 2011-11-13 NOTE — Progress Notes (Addendum)
6 Days Post-Op Procedure(s) (LRB): FLEXIBLE BRONCHOSCOPY (N/A) VIDEO ASSISTED THORACOSCOPY (VATS)/THOROCOTOMY (Right) Subjective:  Steven [name] has no acute complaints this morning.  He remains confused this morning, but this should improve at discharge.  He had a good night last night and is no longer requiring restraints  Objective: Vital signs in last 24 hours: Temp:  [98.1 F (36.7 C)-99.3 F (37.4 C)] 98.8 F (37.1 C) (10/08 0430) Pulse Rate:  [84-108] 84  (10/08 0430) Cardiac Rhythm:  [-] Normal sinus rhythm (10/08 0430) Resp:  [19-22] 21  (10/08 0430) BP: (99-112)/(51-62) 112/55 mmHg (10/08 0430) SpO2:  [88 %-98 %] 94 % (10/08 0430) Intake/Output from previous day: 10/07 0701 - 10/08 0700 In: -  Out: 1000 [Urine:1000]  General appearance: alert, cooperative and no distress Heart: regular rate and rhythm Lungs: clear to auscultation bilaterally Abdomen: soft, non-tender; bowel sounds normal; no masses,  no organomegaly Extremities: extremities normal, atraumatic, no cyanosis or edema Wound: clean and dry  Lab Results: No results found for this basename: WBC:2,HGB:2,HCT:2,PLT:2 in the last 72 hours BMET: No results found for this basename: NA:2,K:2,CL:2,CO2:2,GLUCOSE:2,BUN:2,CREATININE:2,CALCIUM:2 in the last 72 hours  PT/INR: No results found for this basename: LABPROT,INR in the last 72 hours ABG    Component Value Date/Time   PHART 7.415 11/05/2011 1500   HCO3 26.0* 11/05/2011 1500   TCO2 27.3 11/05/2011 1500   ACIDBASEDEF 1.9 07/05/2007 0600   O2SAT 96.6 11/05/2011 1500   CBG (last 3)  No results found for this basename: GLUCAP:3 in the last 72 hours  Assessment/Plan: S/P Procedure(s) (LRB): FLEXIBLE BRONCHOSCOPY (N/A) VIDEO ASSISTED THORACOSCOPY (VATS)/THOROCOTOMY (Right)  1. Post op delirium- patient doing better with agitation, no longer requiring restraints 2. Deconditioning- needs SNF 3. Dispo- patient doing well from surgery standpoint, is ready for d/c to  SNF, once bed arranged   LOS: 6 days    Steven [name] 11/13/2011    Chart reviewed, patient examined, agree with above. Will dc central line and transfer to 2000. Await SNF placement.

## 2011-11-13 NOTE — Clinical Social Work Placement (Signed)
     Clinical Social Work Department CLINICAL SOCIAL WORK PLACEMENT NOTE 11/13/2011  Patient:  Steven [name], Steven [name]  Account Number:  [id number] Admit date:  11/07/2011  Clinical Social Worker:  Theresia Bough, Theresia Majors  Date/time:  11/13/2011 10:35 AM  Clinical Social Work is seeking post-discharge placement for this patient at the following level of care:   SKILLED NURSING   (*CSW will update this form in Epic as items are completed)   11/13/2011  Patient/family provided with [facility] Department of Clinical Social Works list of facilities offering this level of care within the geographic area requested by the patient (or if unable, by the patients family).  11/13/2011  Patient/family informed of their freedom to choose among providers that offer the needed level of care, that participate in Medicare, Medicaid or managed care program needed by the patient, have an available bed and are willing to accept the patient.  11/13/2011  Patient/family informed of [facility] ownership interest in [facility], as well as of the fact that they are under no obligation to receive care at this facility.  PASARR submitted to EDS on 11/13/2011 PASARR number received from EDS on   FL2 transmitted to all facilities in geographic area requested by pt/family on  11/13/2011 FL2 transmitted to all facilities within larger geographic area on   Patient informed that his/her managed care company has contracts with or will negotiate with  certain facilities, including the following:     Patient/family informed of bed offers received:  11/13/2011 Patient chooses bed at [facility] Physician recommends and patient chooses bed at    Patient to be transferred to [facility] on   Patient to be transferred to facility by [facility]  The following physician request were entered in Epic:   Additional Comments:

## 2011-11-13 NOTE — Progress Notes (Signed)
Patient discharged to skilled nursing facility. Report called and family notified.   Wyn Quaker RN

## 2011-11-17 ENCOUNTER — Telehealth: Payer: Self-pay | Admitting: Thoracic Surgery (Cardiothoracic Vascular Surgery)

## 2011-11-17 NOTE — Telephone Encounter (Signed)
Patient was seen in ED at [facility] in [place] because of abnormal CXR done at [facility]   CXR done because of Tb test, and abnormal CXR result prompted patient referral to ED.  CXR shows very small pneumothorax unchanged from last CXR done prior to hospital discharge with some increase in associated subcutaneous air.   According to physician in ED at [facility] the patient was entirely asymptomatic with no SOB and no increased cough and in his opinion with no other clinical need for hospitalization.  I offered to make certain the patient was scheduled for followup visit in our office this week with CXR.  OWEN,CLARENCE H 11/17/2011 11:25 PM

## 2011-11-19 ENCOUNTER — Other Ambulatory Visit: Payer: Self-pay | Admitting: Surgery

## 2011-11-19 DIAGNOSIS — Z9889 Other specified postprocedural states: Secondary | ICD-10-CM

## 2011-11-20 ENCOUNTER — Ambulatory Visit
Admission: RE | Admit: 2011-11-20 | Discharge: 2011-11-20 | Disposition: A | Discharge status: Home / Self Care | Payer: Medicare Other | Source: Ambulatory Visit | Attending: Surgery | Admitting: Surgery

## 2011-11-20 ENCOUNTER — Ambulatory Visit (INDEPENDENT_AMBULATORY_CARE_PROVIDER_SITE_OTHER): Payer: Self-pay | Admitting: Surgery

## 2011-11-20 ENCOUNTER — Encounter: Payer: Self-pay | Admitting: Surgery

## 2011-11-20 VITALS — BP 126/68 | HR 80 | Resp 16 | Ht 67.0 in | Wt 141.0 lb

## 2011-11-20 DIAGNOSIS — J948 Other specified pleural conditions: Secondary | ICD-10-CM

## 2011-11-20 DIAGNOSIS — Z9889 Other specified postprocedural states: Secondary | ICD-10-CM

## 2011-11-20 DIAGNOSIS — C3430 Malignant neoplasm of lower lobe, unspecified bronchus or lung: Secondary | ICD-10-CM

## 2011-11-20 DIAGNOSIS — Z09 Encounter for follow-up examination after completed treatment for conditions other than malignant neoplasm: Secondary | ICD-10-CM

## 2011-11-20 DIAGNOSIS — G8918 Other acute postprocedural pain: Secondary | ICD-10-CM

## 2011-11-20 DIAGNOSIS — J90 Pleural effusion, not elsewhere classified: Secondary | ICD-10-CM

## 2011-11-20 MED ORDER — OXYCODONE HCL 5 MG PO CAPS: 5.0000 mg | ORAL_CAPSULE | ORAL | Status: DC | PRN

## 2011-11-20 NOTE — Progress Notes (Signed)
[address]            [name] [address]          [phone]     HPI:  Patient returns for routine postoperative follow-up having undergone flexible fiberoptic bronchoscopy, right video-assisted thoracoscopy, and right thoracotomy with wedge resection of the right lower lobe lung mass on 11/07/2011. The patient's early postoperative recovery while in the hospital was notable for postoperative delirium. This gradually resolved. The final pathology showed a 1.6 cm poorly differentiated squamous cell carcinoma with negative margins. Since hospital discharge the patient reports he has been at [facility] and rehabilitation center in [facility]. His main complaint is of postoperative right chest wall pain that is not being relieved with the Ultram he has been taking. He said he has been having difficulty sleeping at night.   Current Outpatient Prescriptions  Medication Sig Dispense Refill  . acetaminophen (TYLENOL) 325 MG tablet Take 2 tablets (650 mg total) by mouth every 6 (six) hours as needed.      [name] aspirin EC 81 MG tablet Take 81 mg by mouth daily.      [name] atorvastatin (LIPITOR) 10 MG tablet Take 10 mg by mouth daily.      . clonazePAM (KLONOPIN) 1 MG tablet Take 1 mg by mouth 3 (three) times daily.      [name] doxazosin (CARDURA) 2 MG tablet Take 2 mg by mouth daily.      [name] oxycodone (OXY-IR) 5 MG capsule Take 1 capsule (5 mg total) by mouth every 4 (four) hours as needed.  50 capsule  0    Physical Exam: BP 126/68  Pulse 80  Resp 16  Ht 5\' 7"  (1.702 m)  Wt 141 lb (63.957 kg)  BMI 22.08 kg/m2  SpO2 93% He looks well. He is alert and oriented. Lung exam is clear. There is some subcutaneous air present over the right anterior and lateral chest that was present while he was in the hospital. Cardiac exam shows a regular rate and rhythm with normal heart sounds. The chest incision is healing well.  Diagnostic  Tests:  *RADIOLOGY REPORT*   Clinical Data: Lung cancer post right thoracotomy 2 weeks ago, severe right chest pain, weakness, follow-up VATS   CHEST - 2 VIEW   Comparison: 11/17/2011   Findings: Normal heart size post CABG. Atherosclerotic calcification aorta. Pulmonary vascularity normal. Minimal pleural effusion at lower right chest again identified. Postsurgical changes and atelectasis right lung base. No definite infiltrate. Persistent small right apical pneumothorax unchanged. Extensive right chest wall edema in the right cervical region again identified. Underlying COPD.   IMPRESSION: Post thoracotomy changes in the right hemithorax. COPD with persistent right basilar atelectasis and small right hydropneumothorax.     Original Report Authenticated By: Lollie Marrow, M.D.     Impression:  Overall I think she is making a good recovery following his surgery especially considering his age. I suspect he has some baseline age-related dementia that contributed to his postoperative delirium. He did have subcutaneous emphysema in the hospital after chest tube removal although he never had an airleak. This is stable on his current chest x-ray compared to his x-ray prior to discharge. I would expect this to resolve over the next few weeks. We sent him out on Ultram instead of Oxycodone due to his postoperative delirium but this does not  appear to be relieving his pain. I will start him on Oxycodone IR 5-10 mg every 4 hours when necessary for pain. I told him not to take the Ultram any longer. He is planning on going home with his son from the rehabilitation center in 2 days.   Plan:  I will see him back in 2 weeks for followup with the chest x-ray.

## 2011-11-20 NOTE — Patient Instructions (Addendum)
No lifting of more than 10 lbs for 3 months postop  Do not drive until seen back in the office in two weeks.  Stop taking Ultram for pain and start taking Oxycodone IR

## 2011-11-27 ENCOUNTER — Encounter: Payer: Medicare Other | Admitting: Surgery

## 2011-11-29 ENCOUNTER — Other Ambulatory Visit: Payer: Self-pay | Admitting: Surgery

## 2011-11-29 DIAGNOSIS — R911 Solitary pulmonary nodule: Secondary | ICD-10-CM

## 2011-11-29 DIAGNOSIS — Z9889 Other specified postprocedural states: Secondary | ICD-10-CM

## 2011-12-04 ENCOUNTER — Ambulatory Visit (INDEPENDENT_AMBULATORY_CARE_PROVIDER_SITE_OTHER): Payer: Self-pay | Admitting: Surgery

## 2011-12-04 ENCOUNTER — Ambulatory Visit
Admission: RE | Admit: 2011-12-04 | Discharge: 2011-12-04 | Disposition: A | Discharge status: Home / Self Care | Payer: Medicare Other | Source: Ambulatory Visit | Attending: Surgery | Admitting: Surgery

## 2011-12-04 ENCOUNTER — Encounter: Payer: Self-pay | Admitting: Surgery

## 2011-12-04 VITALS — BP 104/60 | HR 73 | Resp 16 | Ht 67.0 in | Wt 141.0 lb

## 2011-12-04 DIAGNOSIS — R911 Solitary pulmonary nodule: Secondary | ICD-10-CM

## 2011-12-04 DIAGNOSIS — C3430 Malignant neoplasm of lower lobe, unspecified bronchus or lung: Secondary | ICD-10-CM

## 2011-12-04 DIAGNOSIS — Z9889 Other specified postprocedural states: Secondary | ICD-10-CM

## 2011-12-04 DIAGNOSIS — J90 Pleural effusion, not elsewhere classified: Secondary | ICD-10-CM

## 2011-12-04 DIAGNOSIS — Z09 Encounter for follow-up examination after completed treatment for conditions other than malignant neoplasm: Secondary | ICD-10-CM

## 2011-12-04 NOTE — Progress Notes (Signed)
                   [address]            [name] [address]          [phone]      HPI:  Patient returns for routine postoperative follow-up having undergone flexible fiberoptic bronchoscopy, right video-assisted thoracoscopy, and right thoracotomy with wedge resection of the right lower lobe lung mass on 11/07/2011. The patient's early postoperative recovery while in the hospital was notable for postoperative delirium. This gradually resolved. The final pathology showed a 1.6 cm poorly differentiated squamous cell carcinoma with negative margins.   Current Outpatient Prescriptions  Medication Sig Dispense Refill  . acetaminophen (TYLENOL) 325 MG tablet Take 2 tablets (650 mg total) by mouth every 6 (six) hours as needed.      [name] aspirin EC 81 MG tablet Take 81 mg by mouth daily.      [name] atorvastatin (LIPITOR) 10 MG tablet Take 10 mg by mouth daily.      . clonazePAM (KLONOPIN) 1 MG tablet Take 1 mg by mouth 3 (three) times daily.      [name] doxazosin (CARDURA) 2 MG tablet Take 2 mg by mouth daily.      [name] oxycodone (OXY-IR) 5 MG capsule Take 1 capsule (5 mg total) by mouth every 4 (four) hours as needed.  50 capsule  0     Physical Exam: BP 104/60  Pulse 73  Resp 16  Ht 5\' 7"  (1.702 m)  Wt 141 lb (63.957 kg)  BMI 22.08 kg/m2  SpO2 95% He looks well. Lung exam is clear. The right chest incisions are healing well.  Diagnostic Tests:  *RADIOLOGY REPORT*   Clinical Data: Follow-up thoracotomy.   CHEST - 2 VIEW   Comparison: Chest x-ray 11/20/2011.   Findings: Compared to the prior examination the amount of subcutaneous emphysema overlying the right chest wall has significantly decreased and is now nearly completely resolved. Postoperative changes of wedge resection are again noted in the right lower lobe.  There is some scarring in the periphery of the right base posteriorly. Lungs appear hyperexpanded with flattening of the hemidiaphragms, increased  retrosternal air space and pruning of the pulmonary vasculature in the periphery, suggestive of underlying COPD.  No acute consolidative airspace disease.  No pleural effusions.  No evidence of pulmonary edema.  Heart size is normal.  Mediastinal contours are unremarkable.  Atherosclerosis of the thoracic aorta.  Status post median sternotomy for CABG with a LIMA.  Surgical clips project over the right upper quadrant of the abdomen, likely from prior cholecystectomy.   IMPRESSION: 1.  Postoperative changes of a right thoracotomy for right lower lobe wedge resection with some mild scarring in the base of the right hemithorax, but no radiographic evidence of acute cardiopulmonary disease. 2.  Decreasing subcutaneous emphysema throughout the right chest wall. 3.  Atherosclerosis. 4.  Status post CABG with a LIMA. 5.  Changes of COPD redemonstrated, as above.     Original Report Authenticated By: Florencia Reasons, M.D.   Impression:  He is doing well following wedge resection of a right lower lobe lung cancer. He did not require any further treatment.  Plan:  I'll plan to see him back in 3 months with a chest x-ray.

## 2011-12-07 ENCOUNTER — Other Ambulatory Visit: Payer: Self-pay | Admitting: *Deleted

## 2011-12-07 DIAGNOSIS — G8918 Other acute postprocedural pain: Secondary | ICD-10-CM

## 2011-12-07 MED ORDER — HYDROCODONE-ACETAMINOPHEN 7.5-500 MG PO TABS: 1.0000 | ORAL_TABLET | Freq: Four times a day (QID) | ORAL | Status: DC | PRN

## 2011-12-27 ENCOUNTER — Other Ambulatory Visit: Payer: Self-pay

## 2011-12-27 DIAGNOSIS — G8918 Other acute postprocedural pain: Secondary | ICD-10-CM

## 2011-12-27 MED ORDER — HYDROCODONE-ACETAMINOPHEN 7.5-500 MG PO TABS: 1.0000 | ORAL_TABLET | Freq: Four times a day (QID) | ORAL | Status: DC | PRN

## 2011-12-27 NOTE — Telephone Encounter (Signed)
RX refill called into [facility] Lortab 7.5/500 mg 1 po every 4-6 hours prn/pain no refills

## 2012-02-28 ENCOUNTER — Other Ambulatory Visit: Payer: Self-pay | Admitting: *Deleted

## 2012-02-28 DIAGNOSIS — R918 Other nonspecific abnormal finding of lung field: Secondary | ICD-10-CM

## 2012-03-04 ENCOUNTER — Encounter: Payer: Medicare Other | Admitting: Surgery

## 2012-04-01 ENCOUNTER — Ambulatory Visit (INDEPENDENT_AMBULATORY_CARE_PROVIDER_SITE_OTHER): Payer: Medicare Other | Admitting: Surgery

## 2012-04-01 ENCOUNTER — Ambulatory Visit
Admission: RE | Admit: 2012-04-01 | Discharge: 2012-04-01 | Disposition: A | Discharge status: Home / Self Care | Payer: Medicare Other | Source: Ambulatory Visit | Attending: Surgery | Admitting: Surgery

## 2012-04-01 ENCOUNTER — Encounter: Payer: Self-pay | Admitting: Surgery

## 2012-04-01 VITALS — BP 147/79 | HR 86 | Resp 20 | Ht 67.0 in | Wt 141.0 lb

## 2012-04-01 DIAGNOSIS — Z09 Encounter for follow-up examination after completed treatment for conditions other than malignant neoplasm: Secondary | ICD-10-CM

## 2012-04-01 DIAGNOSIS — R918 Other nonspecific abnormal finding of lung field: Secondary | ICD-10-CM

## 2012-04-01 DIAGNOSIS — C3430 Malignant neoplasm of lower lobe, unspecified bronchus or lung: Secondary | ICD-10-CM

## 2012-04-01 NOTE — Progress Notes (Signed)
   [address]       [name] [address]             [phone]      HPI:  Patient returns for routine postoperative follow-up having undergone flexible fiberoptic bronchoscopy, right video-assisted thoracoscopy, and right thoracotomy with wedge resection of the right lower lobe lung mass on 11/07/2011. The final pathology showed a 1.6 cm poorly differentiated squamous cell carcinoma with negative margins.  Since I last saw him 3 months ago he has been feeling well. He denies any headaches or visual changes. He has no cough or sputum production. He denies any chest pain. He has no bone or muscle pain. His appetite has been good and his weight has been stable.   Current Outpatient Prescriptions  Medication Sig Dispense Refill  . acetaminophen (TYLENOL) 325 MG tablet Take 2 tablets (650 mg total) by mouth every 6 (six) hours as needed.      [name] aspirin EC 81 MG tablet Take 81 mg by mouth daily.      [name] atorvastatin (LIPITOR) 10 MG tablet Take 10 mg by mouth daily.      . clonazePAM (KLONOPIN) 1 MG tablet Take 1 mg by mouth 3 (three) times daily.      [name] doxazosin (CARDURA) 2 MG tablet Take 2 mg by mouth daily.      [name] HYDROcodone-acetaminophen (LORTAB 7.5) 7.5-500 MG per tablet Take 1 tablet by mouth every 6 (six) hours as needed for pain (may take one tab every 4-6 hrs prn pain).  40 tablet  0   No current facility-administered medications for this visit.     Physical Exam: BP 147/79  Pulse 86  Resp 20  Ht 5\' 7"  (1.702 m)  Wt 141 lb (63.957 kg)  BMI 22.08 kg/m2  SpO2 96% He looks well. There is no cervical or supraclavicular adenopathy. Lung exam is clear. The right thoracotomy incision is well-healed. There are no skin lesions. Cardiac exam shows a regular rate and rhythm with normal heart sounds. Abdominal exam shows active bowel sounds. The abdomen is soft, nontender, with no palpable masses or organomegaly.  Diagnostic Tests:  *RADIOLOGY REPORT*   Clinical  Data: Knot at right lateral lower rib, history of right lung surgery October 2013, former smoker, hypertension, history myocardial infarction   CHEST - 2 VIEW   Comparison: 12/04/2011   Findings: Normal heart size post CABG. Inferior most sternal wire is fracture. Mediastinal contours and pulmonary vascularity normal. Atherosclerotic calcification aorta. Emphysematous and bronchitic changes consistent with COPD. No acute infiltrate, pleural effusion, or pneumothorax. No acute osseous findings.   IMPRESSION: Post CABG. COPD changes. No acute abnormalities.     Original Report Authenticated By: Ulyses Southward, M.D.   Impression:  He is doing well 6 months following wedge resection of a right lower lobe lung cancer. There is no evidence of recurrence at this time.  Plan:  I'll see him back in 3 months with a chest x-ray and I will plan to do a CT scan of the chest at one year postoperatively.

## 2012-04-25 ENCOUNTER — Telehealth: Payer: Self-pay | Admitting: Physician Assistant

## 2012-04-25 NOTE — Telephone Encounter (Signed)
Per Helene Kelp PAC, pt needs to take RX strength Vitamin D. Left message for pt to call to discuss lab results.RX for Vitamin D 50,000, one weekly, QTY 12 called to [facility].

## 2012-04-25 NOTE — Telephone Encounter (Signed)
Letter mailed to patient concerning low Vitamin D and RX  At [facility] for him.

## 2012-07-01 ENCOUNTER — Ambulatory Visit: Payer: Self-pay | Admitting: Surgery

## 2012-07-01 ENCOUNTER — Other Ambulatory Visit: Payer: Self-pay | Admitting: *Deleted

## 2012-07-01 DIAGNOSIS — I2510 Atherosclerotic heart disease of native coronary artery without angina pectoris: Secondary | ICD-10-CM

## 2012-07-02 ENCOUNTER — Ambulatory Visit (INDEPENDENT_AMBULATORY_CARE_PROVIDER_SITE_OTHER): Payer: Medicare Other | Admitting: Surgery

## 2012-07-02 ENCOUNTER — Ambulatory Visit
Admission: RE | Admit: 2012-07-02 | Discharge: 2012-07-02 | Disposition: A | Discharge status: Home / Self Care | Payer: Medicare Other | Source: Ambulatory Visit | Attending: Thoracic Surgery (Cardiothoracic Vascular Surgery) | Admitting: Thoracic Surgery (Cardiothoracic Vascular Surgery)

## 2012-07-02 ENCOUNTER — Encounter: Payer: Self-pay | Admitting: Surgery

## 2012-07-02 DIAGNOSIS — C3430 Malignant neoplasm of lower lobe, unspecified bronchus or lung: Secondary | ICD-10-CM

## 2012-07-02 DIAGNOSIS — I2510 Atherosclerotic heart disease of native coronary artery without angina pectoris: Secondary | ICD-10-CM

## 2012-07-02 DIAGNOSIS — Z09 Encounter for follow-up examination after completed treatment for conditions other than malignant neoplasm: Secondary | ICD-10-CM

## 2012-07-02 NOTE — Progress Notes (Signed)
    [address]       [name] [address]             [phone]      HPI:  Patient returns for routine postoperative follow-up having undergone flexible fiberoptic bronchoscopy, right video-assisted thoracoscopy, and right thoracotomy with wedge resection of the right lower lobe lung mass on 11/07/2011. The final pathology showed a 1.6 cm poorly differentiated squamous cell carcinoma with negative margins.  Since I last saw him on 04/01/2012 he has been feeling well. He denies any headaches or visual changes. He has no cough or sputum production. He denies any chest pain. He has no bone or muscle pain. His appetite has been good and his weight has been stable.    Current Outpatient Prescriptions  Medication Sig Dispense Refill  . aspirin EC 81 MG tablet Take 81 mg by mouth daily.      [name] atorvastatin (LIPITOR) 10 MG tablet Take 10 mg by mouth daily.      . clonazePAM (KLONOPIN) 1 MG tablet Take 1 mg by mouth 3 (three) times daily.      [name] doxazosin (CARDURA) 2 MG tablet Take 2 mg by mouth daily.      [name] HYDROcodone-acetaminophen (LORTAB 7.5) 7.5-500 MG per tablet Take 1 tablet by mouth every 6 (six) hours as needed for pain (may take one tab every 4-6 hrs prn pain).  40 tablet  0   No current facility-administered medications for this visit.     Physical Exam:  BP 115/72  Pulse 84  Resp 20  Ht 5\' 7"  (1.702 m)  Wt 141 lb (63.957 kg)  BMI 22.08 kg/m2  SpO2 97% He looks well. There is no cervical or supraclavicular adenopathy. Lung exam is clear. The right thoracotomy scar is well-healed and there are no skin lesions. Cardiac exam shows a regular rate and rhythm with normal heart sounds. Abdominal exam shows active bowel sounds. Abdomen is soft, nontender, with no palpable masses or organomegaly.  Diagnostic Tests:  *RADIOLOGY REPORT*   Clinical Data: Coronary artery disease.   CHEST - 2 VIEW   Comparison: 04/01/2012   Findings: Changes of prior CABG.   Severe emphysematous changes throughout the lungs.  Bullous changes in the right lung base, stable.  No effusions.  Heart is normal size.  No acute bony abnormality.   IMPRESSION: Stable severe COPD changes.  No acute findings.     Original Report Authenticated By: Charlett Nose, M.D.     Impression:  He is doing well without evidence of recurrent cancer.  Plan:  I will see him back in October of 2014 which will be one year postoperatively and we'll do a CT scan of the chest without contrast at that time.

## 2012-07-10 ENCOUNTER — Ambulatory Visit (INDEPENDENT_AMBULATORY_CARE_PROVIDER_SITE_OTHER): Payer: Medicare Other | Admitting: Family Medicine

## 2012-07-10 ENCOUNTER — Encounter: Payer: Self-pay | Admitting: Family Medicine

## 2012-07-10 VITALS — BP 157/75 | HR 69 | Temp 97.4°F | Wt 140.0 lb

## 2012-07-10 DIAGNOSIS — F410 Panic disorder [episodic paroxysmal anxiety] without agoraphobia: Secondary | ICD-10-CM

## 2012-07-10 DIAGNOSIS — I2510 Atherosclerotic heart disease of native coronary artery without angina pectoris: Secondary | ICD-10-CM | POA: Insufficient documentation

## 2012-07-10 DIAGNOSIS — F411 Generalized anxiety disorder: Secondary | ICD-10-CM

## 2012-07-10 DIAGNOSIS — E785 Hyperlipidemia, unspecified: Secondary | ICD-10-CM

## 2012-07-10 DIAGNOSIS — N529 Male erectile dysfunction, unspecified: Secondary | ICD-10-CM | POA: Insufficient documentation

## 2012-07-10 DIAGNOSIS — I10 Essential (primary) hypertension: Secondary | ICD-10-CM

## 2012-07-10 MED ORDER — CLONAZEPAM 1 MG PO TABS: 1.0000 mg | ORAL_TABLET | Freq: Three times a day (TID) | ORAL | Status: DC

## 2012-07-10 NOTE — Progress Notes (Signed)
Patient ID: Steven [name], male   DOB: October 10, 1928, 79 y.o.   MRN: [id number] SUBJECTIVE: Chief Complaint  Patient presents with  . Follow-up    needs refills  wants klonopin 3x day    HPI: Here to establish with a new provider and to get his Klonopin refilled for panic and anxiety. Get panicky if he misses a  Dose. Has been on the many years when it was Rx by a psychiatrist who no longer practices in [place]. Has not seen any psychiatrist or been to mental health. No longer smokes. Has other medical problems.  PMH/PSH: reviewed/updated in Epic  SH/FH: reviewed/updated in Epic  Allergies: reviewed/updated in Epic  Medications: reviewed/updated in Epic  Immunizations: reviewed/updated in Epic  ROS: As above in the HPI. All other systems are stable or negative.  OBJECTIVE: APPEARANCE:  Patient in no acute distress.The patient appeared well nourished and normally developed. Acyanotic. Waist: VITAL SIGNS:BP 157/75  Pulse 69  Temp(Src) 97.4 F (36.3 C) (Oral)  Wt 140 lb (63.504 kg)  BMI 21.92 kg/m2  Slim frail elderly WM Kyphotic  SKIN: warm and  Dry without overt rashes, tattoos and scars  HEAD and Neck: without JVD, Head and scalp: normal Eyes:No scleral icterus. Fundi normal, eye movements normal. Ears: Auricle normal, canal normal, Tympanic membranes normal, insufflation normal. Nose: normal Throat: normal Neck & thyroid: normal  CHEST & LUNGS: Chest wall: normal Lungs:coarse breath sounds , rhonchi and mild inspiratory wheezes  CVS: Reveals the PMI to be normally located. Regular rhythm, First and Second Heart sounds are normal,  absence of murmurs, rubs or gallops.  ABDOMEN:  Appearance: normal Benign, no organomegaly, no masses, no Abdominal Aortic enlargement. No Guarding , no rebound. No Bruits. Bowel sounds: normal  RECTAL: N/A GU: N/A  EXTREMETIES: nonedematous.   MUSCULOSKELETAL:  Spine:kyphotic  NEUROLOGIC: oriented to time,place and  person; nonfocal.  ASSESSMENT: HTN (hypertension)  CAD (coronary artery disease)  Erectile dysfunction  HLD (hyperlipidemia)  Anxiety state, unspecified - Plan: clonazePAM (KLONOPIN) 1 MG tablet  Panic disorder - Plan: clonazePAM (KLONOPIN) 1 MG tablet Discussed the risks with the dosage that he is on.  PLAN: Recommend [facility] for psychiatric care. I suspect that he is habituated or dependent on Klonipin. I suspect what he calls panic attacks are withdrawal symptoms  No orders of the defined types were placed in this encounter.    Meds ordered this encounter  Medications  . clonazePAM (KLONOPIN) 1 MG tablet    Sig: Take 1 tablet (1 mg total) by mouth 3 (three) times daily.    Dispense:  90 tablet    Refill:  0   This was Rx to tide him over until he can make an appointment with [facility]. I gave him the hand out with the information on the address and phone number.  Return in about 6 weeks (around 08/21/2012).  Anthem Frazer P. Modesto Charon, M.D.

## 2012-08-22 ENCOUNTER — Encounter: Payer: Self-pay | Admitting: Nurse Practitioner

## 2012-08-22 ENCOUNTER — Ambulatory Visit (INDEPENDENT_AMBULATORY_CARE_PROVIDER_SITE_OTHER): Payer: Medicare Other | Admitting: Nurse Practitioner

## 2012-08-22 VITALS — BP 126/64 | HR 77 | Temp 97.1°F | Ht 67.0 in | Wt 139.0 lb

## 2012-08-22 DIAGNOSIS — Z125 Encounter for screening for malignant neoplasm of prostate: Secondary | ICD-10-CM

## 2012-08-22 DIAGNOSIS — I10 Essential (primary) hypertension: Secondary | ICD-10-CM

## 2012-08-22 DIAGNOSIS — F410 Panic disorder [episodic paroxysmal anxiety] without agoraphobia: Secondary | ICD-10-CM

## 2012-08-22 DIAGNOSIS — F411 Generalized anxiety disorder: Secondary | ICD-10-CM

## 2012-08-22 DIAGNOSIS — E785 Hyperlipidemia, unspecified: Secondary | ICD-10-CM

## 2012-08-22 LAB — COMPLETE METABOLIC PANEL WITH GFR
ALT: 18 U/L (ref 0–53)
AST: 26 U/L (ref 0–37)
Albumin: 4.4 g/dL (ref 3.5–5.2)
Alkaline Phosphatase: 66 U/L (ref 39–117)
BUN: 20 mg/dL (ref 6–23)
CO2: 29 mEq/L (ref 19–32)
Calcium: 10 mg/dL (ref 8.4–10.5)
Chloride: 101 mEq/L (ref 96–112)
Creat: 1.34 mg/dL (ref 0.50–1.35)
GFR, Est African American: 57 mL/min — ABNORMAL LOW
GFR, Est Non African American: 49 mL/min — ABNORMAL LOW
Glucose, Bld: 94 mg/dL (ref 70–99)
Potassium: 5.5 mEq/L — ABNORMAL HIGH (ref 3.5–5.3)
Sodium: 138 mEq/L (ref 135–145)
Total Bilirubin: 0.6 mg/dL (ref 0.3–1.2)
Total Protein: 7.7 g/dL (ref 6.0–8.3)

## 2012-08-22 LAB — PSA: PSA: 3 ng/mL (ref <=4.00)

## 2012-08-22 MED ORDER — DOXAZOSIN MESYLATE 2 MG PO TABS: 2.0000 mg | ORAL_TABLET | Freq: Every day | ORAL | Status: DC

## 2012-08-22 MED ORDER — ATORVASTATIN CALCIUM 10 MG PO TABS: 10.0000 mg | ORAL_TABLET | Freq: Every day | ORAL | Status: DC

## 2012-08-22 MED ORDER — CLONAZEPAM 1 MG PO TABS: 1.0000 mg | ORAL_TABLET | Freq: Three times a day (TID) | ORAL | Status: DC

## 2012-08-22 NOTE — Progress Notes (Signed)
Subjective:    Patient ID: Steven [name], male    DOB: 27-May-1928, 79 y.o.   MRN: [id number]  Hypertension This is a chronic problem. The problem is controlled. Associated symptoms include anxiety. Pertinent negatives include no palpitations, peripheral edema or shortness of breath. Risk factors for coronary artery disease include male gender, dyslipidemia and family history. Past treatments include alpha 1 blockers. The current treatment provides moderate improvement. Hypertensive end-organ damage includes heart failure. There is no history of a thyroid problem.  Hyperlipidemia This is a chronic problem. The current episode started more than 1 year ago. The problem is controlled. Recent lipid tests were reviewed and are normal. Pertinent negatives include no shortness of breath. Current antihyperlipidemic treatment includes statins. The current treatment provides significant improvement of lipids. Risk factors for coronary artery disease include dyslipidemia, family history and hypertension.  Anxiety Presents for follow-up visit. Patient reports no insomnia, palpitations or shortness of breath. Symptoms occur rarely. The severity of symptoms is mild. The quality of sleep is good. Nighttime awakenings: none.        Review of Systems  Respiratory: Negative for shortness of breath.   Cardiovascular: Negative for palpitations.  Psychiatric/Behavioral: The patient does not have insomnia.   All other systems reviewed and are negative.       Objective:   Physical Exam  Constitutional: He is oriented to person, place, and time. He appears well-developed and well-nourished.  HENT:  Head: Normocephalic.  Right Ear: External ear normal.  Left Ear: External ear normal.  Eyes: Pupils are equal, round, and reactive to light.  Neck: Normal range of motion. Neck supple. No thyromegaly present.  Cardiovascular: Normal rate, regular rhythm and intact distal pulses.   Murmur heard. Pulmonary/Chest:  Effort normal and breath sounds normal.  Abdominal: Soft. Bowel sounds are normal. He exhibits no distension. There is no tenderness.  Musculoskeletal: Normal range of motion. He exhibits no edema.  Neurological: He is alert and oriented to person, place, and time.  Skin: Skin is warm and dry.  Psychiatric: He has a normal mood and affect. His behavior is normal. Judgment and thought content normal.    BP 126/64  Pulse 77  Temp(Src) 97.1 F (36.2 C) (Oral)  Ht 5\' 7"  (1.702 m)  Wt 139 lb (63.05 kg)  BMI 21.77 kg/m2       Assessment & Plan:   1. Anxiety state, unspecified   2. Panic disorder   3. Hypertension   4. Hyperlipidemia    Orders Placed This Encounter  Procedures  . COMPLETE METABOLIC PANEL WITH GFR  . NMR Lipoprofile with Lipids  . PSA   Meds ordered this encounter  Medications  . doxazosin (CARDURA) 2 MG tablet    Sig: Take 1 tablet (2 mg total) by mouth daily.    Dispense:  90 tablet    Refill:  1    Order Specific Question:  Supervising Provider    Answer:  Ernestina Penna [[id number]]  . clonazePAM (KLONOPIN) 1 MG tablet    Sig: Take 1 tablet (1 mg total) by mouth 3 (three) times daily.    Dispense:  90 tablet    Refill:  1    Order Specific Question:  Supervising Provider    Answer:  Ernestina Penna [[id number]]  . atorvastatin (LIPITOR) 10 MG tablet    Sig: Take 1 tablet (10 mg total) by mouth daily.    Dispense:  90 tablet    Refill:  1  Order Specific Question:  Supervising Provider    Answer:  Ernestina Penna [[id number]]   Low fat diet and exercsie Continue all meds Labs pending Follow up in 3 months  Mary-Margaret Daphine Deutscher, [name]

## 2012-08-22 NOTE — Patient Instructions (Addendum)
Anxiety and Panic Attacks Anxiety is your body's way of reacting to real danger or something you think is a danger. It may be fear or worry over a situation like losing your job. Sometimes the cause is not known. A panic attack is made up of physical signs like sweating, shaking, or chest pain. Anxiety and panic attacks may start suddenly. They may be strong. They may come at any time of day, even while sleeping. They may come at any time of life. Panic attacks are scary, but they do not harm you physically.  HOME CARE  Avoid any known causes of your anxiety.  Try to relax. Yoga may help. Tell yourself everything will be okay.  Exercise often.  Get expert advice and help (therapy) to stop anxiety or attacks from happening.  Avoid caffeine, alcohol, and drugs.  Only take medicine as told by your doctor. GET HELP RIGHT AWAY IF:  Your attacks seem different than normal attacks.  Your problems are getting worse or concern you. MAKE SURE YOU:  Understand these instructions.  Will watch your condition.  Will get help right away if you are not doing well or get worse. Document Released: 02/24/2010 Document Revised: 04/16/2011 Document Reviewed: 02/24/2010 Exit[employer] Patient Information 2014 [employer].  

## 2012-08-26 ENCOUNTER — Ambulatory Visit: Payer: Medicare Other | Admitting: Family Medicine

## 2012-08-26 LAB — NMR LIPOPROFILE WITH LIPIDS
Cholesterol, Total: 124 mg/dL (ref <200)
HDL Particle Number: 31.9 umol/L (ref >=30.5)
HDL Size: 9.3 nm (ref >=9.2)
HDL-C: 39 mg/dL — ABNORMAL LOW (ref >=40)
LDL (calc): 58 mg/dL (ref <100)
LDL Particle Number: 622 nmol/L (ref <1000)
LDL Size: 20.3 nm — ABNORMAL LOW (ref >20.5)
LP-IR Score: 50 — ABNORMAL HIGH (ref <=45)
Large HDL-P: 5.6 umol/L (ref >=4.8)
Large VLDL-P: 3.6 nmol/L — ABNORMAL HIGH (ref <=2.7)
Small LDL Particle Number: 470 nmol/L (ref <=527)
Triglycerides: 135 mg/dL (ref <150)
VLDL Size: 46.8 nm — ABNORMAL HIGH (ref <=46.6)

## 2012-08-28 ENCOUNTER — Other Ambulatory Visit: Payer: Self-pay | Admitting: *Deleted

## 2012-08-28 ENCOUNTER — Other Ambulatory Visit (INDEPENDENT_AMBULATORY_CARE_PROVIDER_SITE_OTHER): Payer: Medicare Other

## 2012-08-28 DIAGNOSIS — E875 Hyperkalemia: Secondary | ICD-10-CM

## 2012-08-29 LAB — BASIC METABOLIC PANEL WITH GFR
BUN: 24 mg/dL — ABNORMAL HIGH (ref 6–23)
CO2: 28 mEq/L (ref 19–32)
Calcium: 9.5 mg/dL (ref 8.4–10.5)
Chloride: 103 mEq/L (ref 96–112)
Creat: 1.3 mg/dL (ref 0.50–1.35)
GFR, Est African American: 59 mL/min — ABNORMAL LOW
GFR, Est Non African American: 51 mL/min — ABNORMAL LOW
Glucose, Bld: 103 mg/dL — ABNORMAL HIGH (ref 70–99)
Potassium: 4.3 mEq/L (ref 3.5–5.3)
Sodium: 138 mEq/L (ref 135–145)

## 2012-10-31 ENCOUNTER — Other Ambulatory Visit: Payer: Self-pay | Admitting: *Deleted

## 2012-11-19 ENCOUNTER — Encounter: Payer: Self-pay | Admitting: Surgery

## 2012-11-19 ENCOUNTER — Ambulatory Visit (INDEPENDENT_AMBULATORY_CARE_PROVIDER_SITE_OTHER): Payer: Medicare Other | Admitting: Surgery

## 2012-11-19 ENCOUNTER — Ambulatory Visit
Admission: RE | Admit: 2012-11-19 | Discharge: 2012-11-19 | Disposition: A | Discharge status: Home / Self Care | Payer: Medicare Other | Source: Ambulatory Visit | Attending: Surgery | Admitting: Surgery

## 2012-11-19 DIAGNOSIS — Z09 Encounter for follow-up examination after completed treatment for conditions other than malignant neoplasm: Secondary | ICD-10-CM

## 2012-11-19 DIAGNOSIS — C3430 Malignant neoplasm of lower lobe, unspecified bronchus or lung: Secondary | ICD-10-CM

## 2012-11-19 DIAGNOSIS — R918 Other nonspecific abnormal finding of lung field: Secondary | ICD-10-CM

## 2012-11-19 NOTE — Progress Notes (Signed)
      [address]       [name] [address]             [phone]        HPI:  Patient returns for routine postoperative follow-up having undergone flexible fiberoptic bronchoscopy, right video-assisted thoracoscopy, and right thoracotomy with wedge resection of the right lower lobe lung mass on 11/07/2011. The final pathology showed a 1.6 cm poorly differentiated squamous cell carcinoma with negative margins. He continues to do well with no chest pain or shortness of breath. He denies any headaches or visual changes. He has no cough or sputum production. He has no bone or muscle pain. His appetite has been good and his weight has been stable.    Current Outpatient Prescriptions  Medication Sig Dispense Refill  . aspirin EC 81 MG tablet Take 81 mg by mouth daily.      [name] atorvastatin (LIPITOR) 10 MG tablet Take 1 tablet (10 mg total) by mouth daily.  90 tablet  1  . clonazePAM (KLONOPIN) 1 MG tablet Take 1 tablet (1 mg total) by mouth 3 (three) times daily.  90 tablet  1  . doxazosin (CARDURA) 2 MG tablet Take 1 tablet (2 mg total) by mouth daily.  90 tablet  1   No current facility-administered medications for this visit.     Physical Exam:  BP 150/77  Pulse 77  Resp 20  Ht 5\' 7"  (1.702 m)  Wt 140 lb (63.504 kg)  BMI 21.92 kg/m2  SpO2 97% There is no cervical or supraclavicular adenopathy.  Lung exam is clear.  The right thoracotomy scar is well-healed and there are no skin lesions.  Cardiac exam shows a regular rate and rhythm with normal heart sounds.  Abdominal exam shows active bowel sounds. Abdomen is soft, nontender, with no palpable masses or organomegaly.      Diagnostic Tests:  CLINICAL DATA: Followup lung cancer.  EXAM:  CT CHEST WITHOUT CONTRAST  TECHNIQUE:  Multidetector CT imaging of the chest was performed following the  standard protocol without IV contrast.  COMPARISON: PET 10/29/2011 and CT chest 10/17/2011.  FINDINGS:  No  pathologically enlarged mediastinal, hilar or axillary lymph  nodes. Atherosclerotic calcification of the arterial vasculature.  Heart size normal. No pericardial effusion.  Centrilobular emphysema. Postoperative changes of wedge resection in  the right lower lobe are seen with associated scarring and volume  loss. Calcified granuloma along the right major fissure. A 3 mm  subpleural nodule along the minor fissure is unchanged and likely a  subpleural lymph node. Mild scarring in the left upper lobe. 4 mm  nodules in the left upper lobe and along the left major fissure are  unchanged as well. No pleural fluid. Airway is unremarkable.  Incidental imaging of the upper abdomen show no acute findings. No  worrisome lytic or sclerotic lesions.  IMPRESSION:  1. Postoperative changes of right lower lobe wedge resection without  evidence of recurrent or metastatic disease.  2. Scattered pulmonary nodules measure 4 mm or less in size and are  unchanged from 10/17/2011, favoring a benign etiology.  Electronically Signed  By: Leanna Battles M.D.  On: 11/19/2012 12:23         Impression:  He is doing well without evidence of recurrent cancer.   Plan:  I will see him back in 6 months with a CT scan of the chest.

## 2012-11-24 ENCOUNTER — Ambulatory Visit (INDEPENDENT_AMBULATORY_CARE_PROVIDER_SITE_OTHER): Payer: Medicare Other | Admitting: Nurse Practitioner

## 2012-11-24 ENCOUNTER — Encounter: Payer: Self-pay | Admitting: Nurse Practitioner

## 2012-11-24 VITALS — BP 147/67 | HR 81 | Temp 97.1°F | Ht 67.0 in | Wt 144.0 lb

## 2012-11-24 DIAGNOSIS — Z125 Encounter for screening for malignant neoplasm of prostate: Secondary | ICD-10-CM

## 2012-11-24 DIAGNOSIS — F411 Generalized anxiety disorder: Secondary | ICD-10-CM

## 2012-11-24 DIAGNOSIS — J438 Other emphysema: Secondary | ICD-10-CM

## 2012-11-24 DIAGNOSIS — E785 Hyperlipidemia, unspecified: Secondary | ICD-10-CM

## 2012-11-24 DIAGNOSIS — I10 Essential (primary) hypertension: Secondary | ICD-10-CM

## 2012-11-24 DIAGNOSIS — Z23 Encounter for immunization: Secondary | ICD-10-CM

## 2012-11-24 MED ORDER — DOXAZOSIN MESYLATE 2 MG PO TABS: 2.0000 mg | ORAL_TABLET | Freq: Every day | ORAL | Status: DC

## 2012-11-24 MED ORDER — ATORVASTATIN CALCIUM 10 MG PO TABS: 10.0000 mg | ORAL_TABLET | Freq: Every day | ORAL | Status: DC

## 2012-11-24 NOTE — Patient Instructions (Signed)
Health Maintenance, Males A healthy lifestyle and preventative care can promote health and wellness.  Maintain regular health, dental, and eye exams.  Eat a healthy diet. Foods like vegetables, fruits, whole grains, low-fat dairy products, and lean protein foods contain the nutrients you need without too many calories. Decrease your intake of foods high in solid fats, added sugars, and salt. Get information about a proper diet from your caregiver, if necessary.  Regular physical exercise is one of the most important things you can do for your health. Most adults should get at least 150 minutes of moderate-intensity exercise (any activity that increases your heart rate and causes you to sweat) each week. In addition, most adults need muscle-strengthening exercises on 2 or more days a week.   Maintain a healthy weight. The body mass index (BMI) is a screening tool to identify possible weight problems. It provides an estimate of body fat based on height and weight. Your caregiver can help determine your BMI, and can help you achieve or maintain a healthy weight. For adults 20 years and older:  A BMI below 18.5 is considered underweight.  A BMI of 18.5 to 24.9 is normal.  A BMI of 25 to 29.9 is considered overweight.  A BMI of 30 and above is considered obese.  Maintain normal blood lipids and cholesterol by exercising and minimizing your intake of saturated fat. Eat a balanced diet with plenty of fruits and vegetables. Blood tests for lipids and cholesterol should begin at age 20 and be repeated every 5 years. If your lipid or cholesterol levels are high, you are over 50, or you are a high risk for heart disease, you may need your cholesterol levels checked more frequently.Ongoing high lipid and cholesterol levels should be treated with medicines, if diet and exercise are not effective.  If you smoke, find out from your caregiver how to quit. If you do not use tobacco, do not start.  If you  choose to drink alcohol, do not exceed 2 drinks per day. One drink is considered to be 12 ounces (355 mL) of beer, 5 ounces (148 mL) of wine, or 1.5 ounces (44 mL) of liquor.  Avoid use of street drugs. Do not share needles with anyone. Ask for help if you need support or instructions about stopping the use of drugs.  High blood pressure causes heart disease and increases the risk of stroke. Blood pressure should be checked at least every 1 to 2 years. Ongoing high blood pressure should be treated with medicines if weight loss and exercise are not effective.  If you are 45 to 77 years old, ask your caregiver if you should take aspirin to prevent heart disease.  Diabetes screening involves taking a blood sample to check your fasting blood sugar level. This should be done once every 3 years, after age 45, if you are within normal weight and without risk factors for diabetes. Testing should be considered at a younger age or be carried out more frequently if you are overweight and have at least 1 risk factor for diabetes.  Colorectal cancer can be detected and often prevented. Most routine colorectal cancer screening begins at the age of 50 and continues through age 75. However, your caregiver may recommend screening at an earlier age if you have risk factors for colon cancer. On a yearly basis, your caregiver may provide home test kits to check for hidden blood in the stool. Use of a small camera at the end of a tube,   to directly examine the colon (sigmoidoscopy or colonoscopy), can detect the earliest forms of colorectal cancer. Talk to your caregiver about this at age 50, when routine screening begins. Direct examination of the colon should be repeated every 5 to 10 years through age 75, unless early forms of pre-cancerous polyps or small growths are found.  Hepatitis C blood testing is recommended for all people born from 1945 through 1965 and any individual with known risks for hepatitis C.  Healthy  men should no longer receive prostate-specific antigen (PSA) blood tests as part of routine cancer screening. Consult with your caregiver about prostate cancer screening.  Testicular cancer screening is not recommended for adolescents or adult males who have no symptoms. Screening includes self-exam, caregiver exam, and other screening tests. Consult with your caregiver about any symptoms you have or any concerns you have about testicular cancer.  Practice safe sex. Use condoms and avoid high-risk sexual practices to reduce the spread of sexually transmitted infections (STIs).  Use sunscreen with a sun protection factor (SPF) of 30 or greater. Apply sunscreen liberally and repeatedly throughout the day. You should seek shade when your shadow is shorter than you. Protect yourself by wearing long sleeves, pants, a wide-brimmed hat, and sunglasses year round, whenever you are outdoors.  Notify your caregiver of new moles or changes in moles, especially if there is a change in shape or color. Also notify your caregiver if a mole is larger than the size of a pencil eraser.  A one-time screening for abdominal aortic aneurysm (AAA) and surgical repair of large AAAs by sound wave imaging (ultrasonography) is recommended for ages 65 to 75 years who are current or former smokers.  Stay current with your immunizations. Document Released: 07/21/2007 Document Revised: 04/16/2011 Document Reviewed: 06/19/2010 [employer] Patient Information 2014 [employer].  

## 2012-11-24 NOTE — Progress Notes (Signed)
  Subjective:    Patient ID: Steven [name], male    DOB: 08/07/1928, 79 y.o.   MRN: [id number]  Hypertension This is a chronic problem. The problem is controlled. Associated symptoms include anxiety. Pertinent negatives include no palpitations, peripheral edema or shortness of breath. Risk factors for coronary artery disease include male gender, dyslipidemia and family history. Past treatments include alpha 1 blockers. The current treatment provides moderate improvement. Hypertensive end-organ damage includes heart failure. There is no history of a thyroid problem.  Hyperlipidemia This is a chronic problem. The current episode started more than 1 year ago. The problem is controlled. Recent lipid tests were reviewed and are normal. Pertinent negatives include no shortness of breath. Current antihyperlipidemic treatment includes statins. The current treatment provides significant improvement of lipids. Risk factors for coronary artery disease include dyslipidemia, family history and hypertension.  Anxiety Presents for follow-up visit. Patient reports no insomnia, palpitations or shortness of breath. Symptoms occur rarely. The severity of symptoms is mild. The quality of sleep is good. Nighttime awakenings: none.    Lung CA Sees specialist- has follow up in 6 months  Review of Systems  Respiratory: Negative for shortness of breath.   Cardiovascular: Negative for palpitations.  Psychiatric/Behavioral: The patient does not have insomnia.   All other systems reviewed and are negative.       Objective:   Physical Exam  Constitutional: He is oriented to person, place, and time. He appears well-developed and well-nourished.  HENT:  Head: Normocephalic.  Right Ear: External ear normal.  Left Ear: External ear normal.  Eyes: Pupils are equal, round, and reactive to light.  Neck: Normal range of motion. Neck supple. No thyromegaly present.  Cardiovascular: Normal rate, regular rhythm and intact  distal pulses.   Murmur heard. Pulmonary/Chest: Effort normal and breath sounds normal.  Abdominal: Soft. Bowel sounds are normal. He exhibits no distension. There is no tenderness.  Musculoskeletal: Normal range of motion. He exhibits no edema.  Neurological: He is alert and oriented to person, place, and time.  Skin: Skin is warm and dry.  Psychiatric: He has a normal mood and affect. His behavior is normal. Judgment and thought content normal.    BP 147/67  Pulse 81  Temp(Src) 97.1 F (36.2 C) (Oral)  Ht 5\' 7"  (1.702 m)  Wt 144 lb (65.318 kg)  BMI 22.55 kg/m2       Assessment & Plan:   1. HYPERLIPIDEMIA   2. HYPERTENSION   3. EMPHYSEMA   4. ANXIETY    Orders Placed This Encounter  Procedures  . CMP14+EGFR  . NMR, lipoprofile  . PSA, total and free   Meds ordered this encounter  Medications  . doxazosin (CARDURA) 2 MG tablet    Sig: Take 1 tablet (2 mg total) by mouth daily.    Dispense:  90 tablet    Refill:  1    Order Specific Question:  Supervising Provider    Answer:  Ernestina Penna [[id number]]  . atorvastatin (LIPITOR) 10 MG tablet    Sig: Take 1 tablet (10 mg total) by mouth daily.    Dispense:  90 tablet    Refill:  1    Order Specific Question:  Supervising Provider    Answer:  Deborra Medina   Flu shot today Low fat diet and exercsie Continue all meds Labs pending Follow up in 3 months  Mary-Margaret Daphine Deutscher, [name]

## 2013-01-26 ENCOUNTER — Other Ambulatory Visit: Payer: Self-pay

## 2013-01-26 DIAGNOSIS — F411 Generalized anxiety disorder: Secondary | ICD-10-CM

## 2013-01-26 DIAGNOSIS — F410 Panic disorder [episodic paroxysmal anxiety] without agoraphobia: Secondary | ICD-10-CM

## 2013-01-26 MED ORDER — CLONAZEPAM 1 MG PO TABS: 1.0000 mg | ORAL_TABLET | Freq: Three times a day (TID) | ORAL | Status: DC

## 2013-01-26 NOTE — Telephone Encounter (Signed)
Please call in klonopin rx 

## 2013-01-26 NOTE — Telephone Encounter (Signed)
Last seen 11/24/12  MMM   If approved route to nurse to call in [facility]

## 2013-01-26 NOTE — Telephone Encounter (Signed)
Called in.

## 2013-02-25 ENCOUNTER — Ambulatory Visit (INDEPENDENT_AMBULATORY_CARE_PROVIDER_SITE_OTHER): Payer: Medicare HMO | Admitting: Nurse Practitioner

## 2013-02-25 ENCOUNTER — Encounter (INDEPENDENT_AMBULATORY_CARE_PROVIDER_SITE_OTHER): Payer: Self-pay

## 2013-02-25 ENCOUNTER — Encounter: Payer: Self-pay | Admitting: Nurse Practitioner

## 2013-02-25 VITALS — BP 130/65 | HR 83 | Temp 97.2°F | Ht 67.0 in | Wt 143.0 lb

## 2013-02-25 DIAGNOSIS — F411 Generalized anxiety disorder: Secondary | ICD-10-CM

## 2013-02-25 DIAGNOSIS — E785 Hyperlipidemia, unspecified: Secondary | ICD-10-CM

## 2013-02-25 DIAGNOSIS — F410 Panic disorder [episodic paroxysmal anxiety] without agoraphobia: Secondary | ICD-10-CM

## 2013-02-25 DIAGNOSIS — I10 Essential (primary) hypertension: Secondary | ICD-10-CM

## 2013-02-25 DIAGNOSIS — F528 Other sexual dysfunction not due to a substance or known physiological condition: Secondary | ICD-10-CM

## 2013-02-25 DIAGNOSIS — N400 Enlarged prostate without lower urinary tract symptoms: Secondary | ICD-10-CM

## 2013-02-25 MED ORDER — ATORVASTATIN CALCIUM 10 MG PO TABS: 10.0000 mg | ORAL_TABLET | Freq: Every day | ORAL | Status: DC

## 2013-02-25 MED ORDER — CLONAZEPAM 1 MG PO TABS: 1.0000 mg | ORAL_TABLET | Freq: Three times a day (TID) | ORAL | Status: DC

## 2013-02-25 MED ORDER — DOXAZOSIN MESYLATE 2 MG PO TABS: 2.0000 mg | ORAL_TABLET | Freq: Every day | ORAL | Status: DC

## 2013-02-25 NOTE — Patient Instructions (Signed)

## 2013-02-25 NOTE — Progress Notes (Signed)
Subjective:    Patient ID: Steven [name], male    DOB: 10/28/1928, 80 y.o.   MRN: [id number]  Patient here today for follow up of chronic medical conditions.  Hypertension This is a chronic problem. The problem is controlled. Associated symptoms include anxiety. Pertinent negatives include no palpitations, peripheral edema or shortness of breath. Risk factors for coronary artery disease include male gender, dyslipidemia and family history. Past treatments include alpha 1 blockers. The current treatment provides moderate improvement. Hypertensive end-organ damage includes heart failure. There is no history of a thyroid problem.  Hyperlipidemia This is a chronic problem. The current episode started more than 1 year ago. The problem is controlled. Recent lipid tests were reviewed and are normal. Pertinent negatives include no shortness of breath. Current antihyperlipidemic treatment includes statins. The current treatment provides significant improvement of lipids. Risk factors for coronary artery disease include dyslipidemia, family history and hypertension.  Anxiety Presents for follow-up visit. Patient reports no insomnia, palpitations or shortness of breath. Symptoms occur rarely. The severity of symptoms is mild. The quality of sleep is good. Nighttime awakenings: none.    BPH Currently on cardura which helps with his flow- ocassionaly has problems with urine- but usually does well. *Back pain usually does well in the mornings no pain- as day wears on his back will start hurting and by bedtime he can't hardly get around again by morning it  Has resolved.   Review of Systems  Respiratory: Negative for shortness of breath.   Cardiovascular: Negative for palpitations.  Psychiatric/Behavioral: The patient does not have insomnia.   All other systems reviewed and are negative.       Objective:   Physical Exam  Constitutional: He is oriented to person, place, and time. He appears  well-developed and well-nourished.  HENT:  Head: Normocephalic.  Right Ear: External ear normal.  Left Ear: External ear normal.  Eyes: Pupils are equal, round, and reactive to light.  Neck: Normal range of motion. Neck supple. No thyromegaly present.  Cardiovascular: Normal rate, regular rhythm and intact distal pulses.   Murmur heard. Pulmonary/Chest: Effort normal and breath sounds normal.  Abdominal: Soft. Bowel sounds are normal. He exhibits no distension. There is no tenderness.  Musculoskeletal: Normal range of motion. He exhibits no edema.  Neurological: He is alert and oriented to person, place, and time.  Skin: Skin is warm and dry.  Psychiatric: He has a normal mood and affect. His behavior is normal. Judgment and thought content normal.    BP 130/65  Pulse 83  Temp(Src) 97.2 F (36.2 C) (Oral)  Ht 5' 7"  (1.702 m)  Wt 143 lb (64.864 kg)  BMI 22.39 kg/m2       Assessment & Plan:   1. HYPERTENSION   2. HYPERLIPIDEMIA   3. BPH (benign prostatic hyperplasia)   4. ERECTILE DYSFUNCTION   5. Anxiety state, unspecified   6. Panic disorder    Orders Placed This Encounter  Procedures  . CMP14+EGFR  . NMR, lipoprofile   Meds ordered this encounter  Medications  . DISCONTD: clonazePAM (KLONOPIN) 1 MG tablet    Sig: Take 1 tablet (1 mg total) by mouth 3 (three) times daily.    Dispense:  90 tablet    Refill:  1    Order Specific Question:  Supervising Provider    Answer:  Chipper Herb [[id number]]  . atorvastatin (LIPITOR) 10 MG tablet    Sig: Take 1 tablet (10 mg total) by mouth daily.  Dispense:  90 tablet    Refill:  1    Order Specific Question:  Supervising Provider    Answer:  Chipper Herb [[id number]]  . doxazosin (CARDURA) 2 MG tablet    Sig: Take 1 tablet (2 mg total) by mouth daily.    Dispense:  90 tablet    Refill:  1    Order Specific Question:  Supervising Provider    Answer:  Chipper Herb [[id number]]  . clonazePAM (KLONOPIN) 1 MG tablet    Sig:  Take 1 tablet (1 mg total) by mouth 3 (three) times daily.    Dispense:  90 tablet    Refill:  1    Do not fill till 03/20/13    Order Specific Question:  Supervising Provider    Answer:  Chipper Herb [[id number]]    Labs pending Health maintenance reviewed Diet and exercise encouraged Continue all meds Follow up  In 3 months   [place], [name]

## 2013-02-26 LAB — NMR, LIPOPROFILE
Cholesterol: 127 mg/dL (ref <200)
HDL Cholesterol by NMR: 40 mg/dL (ref >=40)
HDL Particle Number: 31.1 umol/L (ref >=30.5)
LDL Particle Number: 704 nmol/L (ref <1000)
LDL Size: 20.5 nm — ABNORMAL LOW (ref >20.5)
LDLC SERPL CALC-MCNC: 56 mg/dL (ref <100)
LP-IR Score: 52 — ABNORMAL HIGH (ref <=45)
Small LDL Particle Number: 381 nmol/L (ref <=527)
Triglycerides by NMR: 154 mg/dL — ABNORMAL HIGH (ref <150)

## 2013-02-26 LAB — CMP14+EGFR
ALT: 16 IU/L (ref 0–44)
AST: 24 IU/L (ref 0–40)
Albumin/Globulin Ratio: 1.4 (ref 1.1–2.5)
Albumin: 4.2 g/dL (ref 3.5–4.7)
Alkaline Phosphatase: 66 IU/L (ref 39–117)
BUN/Creatinine Ratio: 13 (ref 10–22)
BUN: 20 mg/dL (ref 8–27)
CO2: 27 mmol/L (ref 18–29)
Calcium: 9.9 mg/dL (ref 8.6–10.2)
Chloride: 99 mmol/L (ref 97–108)
Creatinine, Ser: 1.49 mg/dL — ABNORMAL HIGH (ref 0.76–1.27)
GFR calc Af Amer: 50 mL/min/{1.73_m2} — ABNORMAL LOW (ref >59)
GFR calc non Af Amer: 43 mL/min/{1.73_m2} — ABNORMAL LOW (ref >59)
Globulin, Total: 3 g/dL (ref 1.5–4.5)
Glucose: 113 mg/dL — ABNORMAL HIGH (ref 65–99)
Potassium: 5 mmol/L (ref 3.5–5.2)
Sodium: 138 mmol/L (ref 134–144)
Total Bilirubin: 0.4 mg/dL (ref 0.0–1.2)
Total Protein: 7.2 g/dL (ref 6.0–8.5)

## 2013-02-27 ENCOUNTER — Telehealth: Payer: Self-pay | Admitting: *Deleted

## 2013-02-27 NOTE — Telephone Encounter (Signed)
Aware. 

## 2013-02-27 NOTE — Telephone Encounter (Signed)
Message copied by Shelbie Ammons on Fri Feb 27, 2013 12:09 PM ------      Message from: [name]      Created: Fri Feb 27, 2013  8:17 AM       Creatine creeping upward- avoid all NSAIDS      Decrease carbs in diet- fasting blood sugar creeping up      Cholesterol looks great ------

## 2013-04-23 ENCOUNTER — Other Ambulatory Visit: Payer: Self-pay

## 2013-04-23 DIAGNOSIS — D381 Neoplasm of uncertain behavior of trachea, bronchus and lung: Secondary | ICD-10-CM

## 2013-04-29 ENCOUNTER — Other Ambulatory Visit: Payer: Self-pay | Admitting: *Deleted

## 2013-04-29 MED ORDER — CLONAZEPAM 1 MG PO TABS: 1.0000 mg | ORAL_TABLET | Freq: Three times a day (TID) | ORAL | Status: DC

## 2013-04-29 NOTE — Telephone Encounter (Signed)
Please call in klonopin with 1 refills 

## 2013-04-29 NOTE — Telephone Encounter (Signed)
Patient last seen in office on 02-25-13. Rx last filled on 03-31-13. Please advise. If approved please route to Pool B so nurse can phone in to [facility]

## 2013-04-30 NOTE — Telephone Encounter (Signed)
Left authorization on pharmacy voicemail.

## 2013-05-29 ENCOUNTER — Encounter (INDEPENDENT_AMBULATORY_CARE_PROVIDER_SITE_OTHER): Payer: Self-pay

## 2013-05-29 ENCOUNTER — Ambulatory Visit (INDEPENDENT_AMBULATORY_CARE_PROVIDER_SITE_OTHER): Payer: Medicare HMO | Admitting: Nurse Practitioner

## 2013-05-29 ENCOUNTER — Encounter: Payer: Self-pay | Admitting: Nurse Practitioner

## 2013-05-29 VITALS — BP 132/62 | HR 79 | Temp 96.6°F | Ht 67.0 in | Wt 143.0 lb

## 2013-05-29 DIAGNOSIS — E785 Hyperlipidemia, unspecified: Secondary | ICD-10-CM

## 2013-05-29 DIAGNOSIS — J438 Other emphysema: Secondary | ICD-10-CM

## 2013-05-29 DIAGNOSIS — I10 Essential (primary) hypertension: Secondary | ICD-10-CM

## 2013-05-29 DIAGNOSIS — F411 Generalized anxiety disorder: Secondary | ICD-10-CM

## 2013-05-29 DIAGNOSIS — N400 Enlarged prostate without lower urinary tract symptoms: Secondary | ICD-10-CM

## 2013-05-29 MED ORDER — CLONAZEPAM 1 MG PO TABS: 1.0000 mg | ORAL_TABLET | Freq: Three times a day (TID) | ORAL | Status: DC

## 2013-05-29 MED ORDER — DOXAZOSIN MESYLATE 2 MG PO TABS: 2.0000 mg | ORAL_TABLET | Freq: Every day | ORAL | Status: DC

## 2013-05-29 MED ORDER — ATORVASTATIN CALCIUM 10 MG PO TABS: 10.0000 mg | ORAL_TABLET | Freq: Every day | ORAL | Status: DC

## 2013-05-29 NOTE — Progress Notes (Signed)
  Subjective:    Patient ID: Steven [name], male    DOB: 10/20/1928, 80 y.o.   MRN: [id number]  Patient here today for follow up of chronic medical conditions.  Hypertension This is a chronic problem. The problem is controlled. Associated symptoms include anxiety. Pertinent negatives include no palpitations, peripheral edema or shortness of breath. Risk factors for coronary artery disease include male gender, dyslipidemia and family history. Past treatments include alpha 1 blockers. The current treatment provides moderate improvement. Hypertensive end-organ damage includes heart failure. There is no history of a thyroid problem.  Hyperlipidemia This is a chronic problem. The current episode started more than 1 year ago. The problem is controlled. Recent lipid tests were reviewed and are normal. Pertinent negatives include no shortness of breath. Current antihyperlipidemic treatment includes statins. The current treatment provides significant improvement of lipids. Risk factors for coronary artery disease include dyslipidemia, family history and hypertension.  Anxiety Presents for follow-up visit. Patient reports no insomnia, palpitations or shortness of breath. Symptoms occur rarely. The severity of symptoms is mild. The quality of sleep is good. Nighttime awakenings: none.    BPH Currently on cardura which helps with his flow- ocassionaly has problems with urine- but usually does well.  * Feels like he is congested this morning. No fever an dno cough  Review of Systems  Respiratory: Negative for shortness of breath.   Cardiovascular: Negative for palpitations.  Psychiatric/Behavioral: The patient does not have insomnia.   All other systems reviewed and are negative.      Objective:   Physical Exam  Constitutional: He is oriented to person, place, and time. He appears well-developed and well-nourished.  HENT:  Head: Normocephalic.  Right Ear: External ear normal.  Left Ear: External  ear normal.  Eyes: Pupils are equal, round, and reactive to light.  Neck: Normal range of motion. Neck supple. No thyromegaly present.  Cardiovascular: Normal rate, regular rhythm and intact distal pulses.   Murmur heard. Pulmonary/Chest: Effort normal and breath sounds normal.  Abdominal: Soft. Bowel sounds are normal. He exhibits no distension. There is no tenderness.  Musculoskeletal: Normal range of motion. He exhibits no edema.  Neurological: He is alert and oriented to person, place, and time.  Skin: Skin is warm and dry.  Psychiatric: He has a normal mood and affect. His behavior is normal. Judgment and thought content normal.    BP 132/62  Pulse 79  Temp(Src) 96.6 F (35.9 C) (Oral)  Ht 5\' 7"  (1.702 m)  Wt 143 lb (64.864 kg)  BMI 22.39 kg/m2       Assessment & Plan:

## 2013-05-29 NOTE — Patient Instructions (Signed)

## 2013-05-30 LAB — NMR, LIPOPROFILE
Cholesterol: 119 mg/dL (ref <200)
HDL Cholesterol by NMR: 41 mg/dL (ref >=40)
HDL Particle Number: 30.7 umol/L (ref >=30.5)
LDL Particle Number: 452 nmol/L (ref <1000)
LDL Size: 20.7 nm (ref >20.5)
LDLC SERPL CALC-MCNC: 55 mg/dL (ref <100)
LP-IR Score: 43 (ref <=45)
Small LDL Particle Number: 216 nmol/L (ref <=527)
Triglycerides by NMR: 115 mg/dL (ref <150)

## 2013-05-30 LAB — CMP14+EGFR
ALT: 13 IU/L (ref 0–44)
AST: 25 IU/L (ref 0–40)
Albumin/Globulin Ratio: 1.3 (ref 1.1–2.5)
Albumin: 4.1 g/dL (ref 3.5–4.7)
Alkaline Phosphatase: 71 IU/L (ref 39–117)
BUN/Creatinine Ratio: 15 (ref 10–22)
BUN: 20 mg/dL (ref 8–27)
CO2: 28 mmol/L (ref 18–29)
Calcium: 9.9 mg/dL (ref 8.6–10.2)
Chloride: 101 mmol/L (ref 97–108)
Creatinine, Ser: 1.31 mg/dL — ABNORMAL HIGH (ref 0.76–1.27)
GFR calc Af Amer: 58 mL/min/{1.73_m2} — ABNORMAL LOW (ref >59)
GFR calc non Af Amer: 50 mL/min/{1.73_m2} — ABNORMAL LOW (ref >59)
Globulin, Total: 3.1 g/dL (ref 1.5–4.5)
Glucose: 87 mg/dL (ref 65–99)
Potassium: 5.5 mmol/L — ABNORMAL HIGH (ref 3.5–5.2)
Sodium: 141 mmol/L (ref 134–144)
Total Bilirubin: 0.5 mg/dL (ref 0.0–1.2)
Total Protein: 7.2 g/dL (ref 6.0–8.5)

## 2013-05-30 LAB — PSA, TOTAL AND FREE
PSA, Free Pct: 26.8 %
PSA, Free: 0.83 ng/mL
PSA: 3.1 ng/mL (ref 0.0–4.0)

## 2013-06-03 ENCOUNTER — Encounter: Payer: Self-pay | Admitting: Surgery

## 2013-06-03 ENCOUNTER — Ambulatory Visit (INDEPENDENT_AMBULATORY_CARE_PROVIDER_SITE_OTHER): Payer: Commercial Managed Care - HMO | Admitting: Surgery

## 2013-06-03 ENCOUNTER — Ambulatory Visit
Admission: RE | Admit: 2013-06-03 | Discharge: 2013-06-03 | Disposition: A | Discharge status: Home / Self Care | Payer: Commercial Managed Care - HMO | Source: Ambulatory Visit | Attending: Surgery | Admitting: Surgery

## 2013-06-03 VITALS — BP 129/73 | HR 84 | Resp 20 | Ht 67.0 in | Wt 143.0 lb

## 2013-06-03 DIAGNOSIS — Z85118 Personal history of other malignant neoplasm of bronchus and lung: Secondary | ICD-10-CM

## 2013-06-03 DIAGNOSIS — D381 Neoplasm of uncertain behavior of trachea, bronchus and lung: Secondary | ICD-10-CM

## 2013-06-03 MED ORDER — IOHEXOL 300 MG/ML  SOLN
75.0000 mL | Freq: Once | INTRAMUSCULAR | Status: AC | PRN
  Administered 2013-06-03: 75 mL via INTRAVENOUS

## 2013-06-03 NOTE — Progress Notes (Signed)
      HPI:  Patient returns for routine postoperative follow-up having undergone flexible fiberoptic bronchoscopy, right video-assisted thoracoscopy, and right thoracotomy with wedge resection of the right lower lobe lung mass on 11/07/2011. The final pathology showed a 1.6 cm poorly differentiated squamous cell carcinoma with negative margins. He continues to do well with no chest pain or shortness of breath. He denies any headaches or visual changes. He has no cough or sputum production. He has no bone or muscle pain. His appetite has been good and his weight has been stable.     Current Outpatient Prescriptions  Medication Sig Dispense Refill  . aspirin EC 81 MG tablet Take 81 mg by mouth daily.      [name] atorvastatin (LIPITOR) 10 MG tablet Take 1 tablet (10 mg total) by mouth daily.  90 tablet  1  . clonazePAM (KLONOPIN) 1 MG tablet Take 1 tablet (1 mg total) by mouth 3 (three) times daily.  90 tablet  1  . doxazosin (CARDURA) 2 MG tablet Take 1 tablet (2 mg total) by mouth daily.  90 tablet  1   No current facility-administered medications for this visit.     Physical Exam:  BP 129/73  Pulse 84  Resp 20  Ht 5\' 7"  (1.702 m)  Wt 143 lb (64.864 kg)  BMI 22.39 kg/m2  SpO2 96% There is no cervical or supraclavicular adenopathy.  Lung exam is clear.  The right thoracotomy scar is well-healed and there are no skin lesions.  Cardiac exam shows a regular rate and rhythm with normal heart sounds.  Abdominal exam shows active bowel sounds. Abdomen is soft, nontender, with no palpable masses or organomegaly.    Diagnostic Tests:  CLINICAL DATA: Right lung nodule. Follow-up right lung cancer and  wedge resection.  EXAM:  CT CHEST WITH CONTRAST  TECHNIQUE:  Multidetector CT imaging of the chest was performed during  intravenous contrast administration.  CONTRAST: 86mL OMNIPAQUE IOHEXOL 300 MG/ML SOLN  COMPARISON: 11/19/2012.  FINDINGS:  No pathologically enlarged mediastinal,  hilar or axillary lymph  nodes. Atherosclerotic calcification of the arterial vasculature.  Heart size normal. No pericardial effusion.  Centrilobular emphysema. Mild bullous disease in the apex of the  left upper lobe. 4 mm nodule along the minor fissure is unchanged.  Postoperative changes and scarring in the right lower lobe, with  associated volume loss, stable. Additional scattered pulmonary  nodules in the left lung measure up to 4 mm in the left upper lobe,  stable. No pleural fluid. Airway is unremarkable.  Incidental imaging of the upper abdomen shows a focal area of low  attenuation in the dome of the liver, measuring 11 mm, possibly due  to focal fat. Cholecystectomy. Visualized portions of the adrenal  glands, kidneys, spleen, pancreas, stomach and bowel are otherwise  grossly unremarkable. No upper abdominal adenopathy. No worrisome  lytic or sclerotic lesions. Degenerative changes are seen in the  spine.  IMPRESSION:  1. Postoperative changes in the right lower lobe with associated  scarring and volume loss, stable.  2. Scattered pulmonary nodules measure 4 mm or less in size, stable.  Electronically Signed  By: Lorin Picket M.D.  On: 06/03/2013 13:12    Impression:   He is doing well without evidence of recurrent cancer.   Plan:   I will see him back in 6 months with a CT scan of the chest.

## 2013-06-05 ENCOUNTER — Encounter: Payer: Self-pay | Admitting: *Deleted

## 2013-07-27 ENCOUNTER — Other Ambulatory Visit: Payer: Self-pay | Admitting: Nurse Practitioner

## 2013-07-28 NOTE — Telephone Encounter (Signed)
Called in.

## 2013-07-28 NOTE — Telephone Encounter (Signed)
Please call in klonopin with 1 refills 

## 2013-08-26 ENCOUNTER — Other Ambulatory Visit: Payer: Self-pay | Admitting: Nurse Practitioner

## 2013-08-27 ENCOUNTER — Ambulatory Visit (INDEPENDENT_AMBULATORY_CARE_PROVIDER_SITE_OTHER): Payer: Medicare HMO | Admitting: Nurse Practitioner

## 2013-08-27 ENCOUNTER — Encounter: Payer: Self-pay | Admitting: Nurse Practitioner

## 2013-08-27 VITALS — BP 115/62 | HR 82 | Temp 98.0°F | Ht 67.0 in | Wt 143.8 lb

## 2013-08-27 DIAGNOSIS — N400 Enlarged prostate without lower urinary tract symptoms: Secondary | ICD-10-CM

## 2013-08-27 DIAGNOSIS — J439 Emphysema, unspecified: Secondary | ICD-10-CM

## 2013-08-27 DIAGNOSIS — E785 Hyperlipidemia, unspecified: Secondary | ICD-10-CM

## 2013-08-27 DIAGNOSIS — I2510 Atherosclerotic heart disease of native coronary artery without angina pectoris: Secondary | ICD-10-CM

## 2013-08-27 DIAGNOSIS — I10 Essential (primary) hypertension: Secondary | ICD-10-CM

## 2013-08-27 DIAGNOSIS — F411 Generalized anxiety disorder: Secondary | ICD-10-CM

## 2013-08-27 DIAGNOSIS — Z1212 Encounter for screening for malignant neoplasm of rectum: Secondary | ICD-10-CM

## 2013-08-27 DIAGNOSIS — J438 Other emphysema: Secondary | ICD-10-CM

## 2013-08-27 MED ORDER — CLONAZEPAM 1 MG PO TABS: ORAL_TABLET | ORAL | Status: DC

## 2013-08-27 NOTE — Progress Notes (Signed)
  Subjective:    Patient ID: Steven [name], male    DOB: 12-22-1928, 80 y.o.   MRN: [id number]  Patient here today for follow up of chronic medical conditions.  Hypertension This is a chronic problem. The problem is controlled. Associated symptoms include anxiety. Pertinent negatives include no palpitations, peripheral edema or shortness of breath. Risk factors for coronary artery disease include male gender, dyslipidemia and family history. Past treatments include alpha 1 blockers. The current treatment provides moderate improvement. Hypertensive end-organ damage includes heart failure. There is no history of a thyroid problem.  Hyperlipidemia This is a chronic problem. The current episode started more than 1 year ago. The problem is controlled. Recent lipid tests were reviewed and are normal. Pertinent negatives include no shortness of breath. Current antihyperlipidemic treatment includes statins. The current treatment provides significant improvement of lipids. Risk factors for coronary artery disease include dyslipidemia, family history and hypertension.  Anxiety Presents for follow-up visit. Patient reports no insomnia, palpitations or shortness of breath. Symptoms occur rarely. The severity of symptoms is mild. The quality of sleep is good. Nighttime awakenings: none.    BPH Currently on cardura which helps with his flow- ocassionaly has problems with urine- but usually does well. COPD with EMPHYSEMA:  patient on no medication and stop smoking about 8 years ago.   Review of Systems  Respiratory: Negative for shortness of breath.   Cardiovascular: Negative for palpitations.  Psychiatric/Behavioral: The patient does not have insomnia.   All other systems reviewed and are negative.      Objective:   Physical Exam  Constitutional: He is oriented to person, place, and time. He appears well-developed and well-nourished.  HENT:  Head: Normocephalic.  Right Ear: External ear normal.   Left Ear: External ear normal.  Eyes: Pupils are equal, round, and reactive to light.  Neck: Normal range of motion. Neck supple. No thyromegaly present.  Cardiovascular: Normal rate, regular rhythm and intact distal pulses.   Murmur heard. Pulmonary/Chest: Effort normal and breath sounds normal.  Abdominal: Soft. Bowel sounds are normal. He exhibits no distension. There is no tenderness.  Musculoskeletal: Normal range of motion. He exhibits no edema.  Neurological: He is alert and oriented to person, place, and time.  Skin: Skin is warm and dry.  Psychiatric: He has a normal mood and affect. His behavior is normal. Judgment and thought content normal.    BP 115/62  Pulse 82  Temp(Src) 98 F (36.7 C) (Oral)  Ht $R'5\' 7"'pw$  (1.702 m)  Wt 143 lb 12.8 oz (65.227 kg)  BMI 22.52 kg/m2       Assessment & Plan:   1. ANXIETY   2. HYPERLIPIDEMIA   3. HYPERTENSION   4. BPH (benign prostatic hyperplasia)   5. CORONARY ARTERY DISEASE   6. Pulmonary emphysema, unspecified emphysema type    Orders Placed This Encounter  Procedures  . CMP14+EGFR  . NMR, lipoprofile    Meds ordered this encounter  Medications  . clonazePAM (KLONOPIN) 1 MG tablet    Sig: TAKE ONE TABLET BY MOUTH THREE TIMES DAILY    Dispense:  90 tablet    Refill:  1    Order Specific Question:  Supervising Provider    Answer:  Chipper Herb [[id number]]   hemoccult cards given to patient with directions Labs pending Health maintenance reviewed Diet and exercise encouraged Continue all meds Follow up  In 3 months.    Mary-Margaret Hassell Done, [name]

## 2013-08-27 NOTE — Patient Instructions (Signed)

## 2013-08-27 NOTE — Addendum Note (Signed)
Addended by: Wyline Mood on: 08/27/2013 04:22 PM   Modules accepted: Orders

## 2013-08-27 NOTE — Telephone Encounter (Signed)
Patient last seen in office on 05-29-13. Rx last filled on 07-28-13 for #90. Please advise. If approved please route to Pool B so nurse can phone in to pharmacy

## 2013-08-28 ENCOUNTER — Ambulatory Visit: Payer: Medicare HMO | Admitting: Nurse Practitioner

## 2013-08-28 LAB — CMP14+EGFR
ALT: 15 IU/L (ref 0–44)
AST: 24 IU/L (ref 0–40)
Albumin/Globulin Ratio: 1.4 (ref 1.1–2.5)
Albumin: 4.2 g/dL (ref 3.5–4.7)
Alkaline Phosphatase: 72 IU/L (ref 39–117)
BUN/Creatinine Ratio: 12 (ref 10–22)
BUN: 19 mg/dL (ref 8–27)
CO2: 28 mmol/L (ref 18–29)
Calcium: 9.9 mg/dL (ref 8.6–10.2)
Chloride: 102 mmol/L (ref 97–108)
Creatinine, Ser: 1.54 mg/dL — ABNORMAL HIGH (ref 0.76–1.27)
GFR calc Af Amer: 48 mL/min/{1.73_m2} — ABNORMAL LOW (ref >59)
GFR calc non Af Amer: 41 mL/min/{1.73_m2} — ABNORMAL LOW (ref >59)
Globulin, Total: 3 g/dL (ref 1.5–4.5)
Glucose: 94 mg/dL (ref 65–99)
Potassium: 5 mmol/L (ref 3.5–5.2)
Sodium: 141 mmol/L (ref 134–144)
Total Bilirubin: 0.4 mg/dL (ref 0.0–1.2)
Total Protein: 7.2 g/dL (ref 6.0–8.5)

## 2013-08-28 LAB — NMR, LIPOPROFILE
Cholesterol: 125 mg/dL (ref 100–199)
HDL Cholesterol by NMR: 38 mg/dL — ABNORMAL LOW (ref >39)
HDL Particle Number: 28.6 umol/L — ABNORMAL LOW (ref >=30.5)
LDL Particle Number: 650 nmol/L (ref <1000)
LDL Size: 20.3 nm (ref >20.5)
LDLC SERPL CALC-MCNC: 56 mg/dL (ref 0–99)
LP-IR Score: 61 — ABNORMAL HIGH (ref <=45)
Small LDL Particle Number: 400 nmol/L (ref <=527)
Triglycerides by NMR: 155 mg/dL — ABNORMAL HIGH (ref 0–149)

## 2013-08-28 LAB — FECAL OCCULT BLOOD, IMMUNOCHEMICAL: Fecal Occult Bld: NEGATIVE

## 2013-08-31 ENCOUNTER — Ambulatory Visit: Payer: Self-pay | Admitting: Nurse Practitioner

## 2013-09-08 ENCOUNTER — Encounter: Payer: Self-pay | Admitting: *Deleted
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
## 2013-09-25 IMAGING — CT CT HEAD W/O CM
1 of 2 series · 13 of 30 positions shown, 17 images · non-contrast
Comparison: 04/22/2011.

CLINICAL DATA: 82-year-old male with subdural hematoma.

CT HEAD WITHOUT CONTRAST
TECHNIQUE: Contiguous axial images were obtained from the base of
the skull through the vertex without contrast.

[Series 2: brain · axial · 0.47mm/px · z∈[+166,+289]mm · 13 of 28 slices shown, 17 images]
[im 2/28  brain]
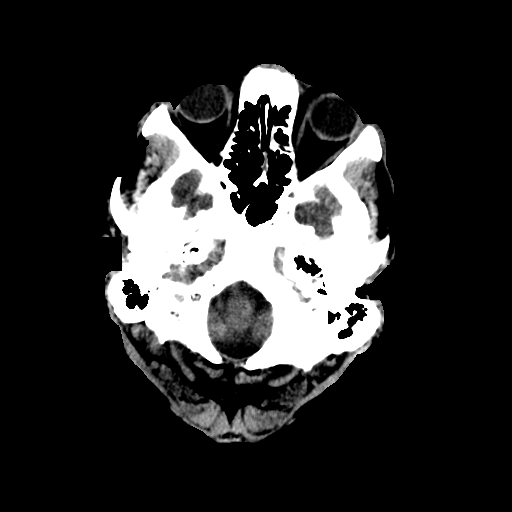
[im 2/28  bone]
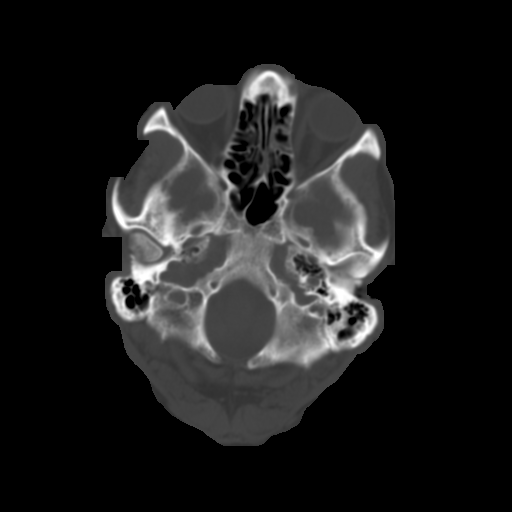
[im 4/28  brain]
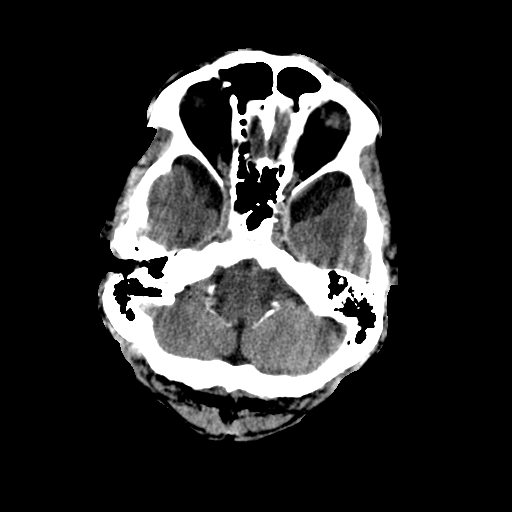
[im 6/28  brain]
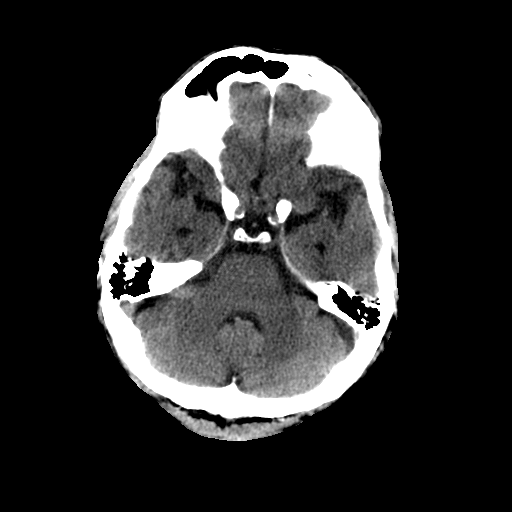
[im 8/28  brain]
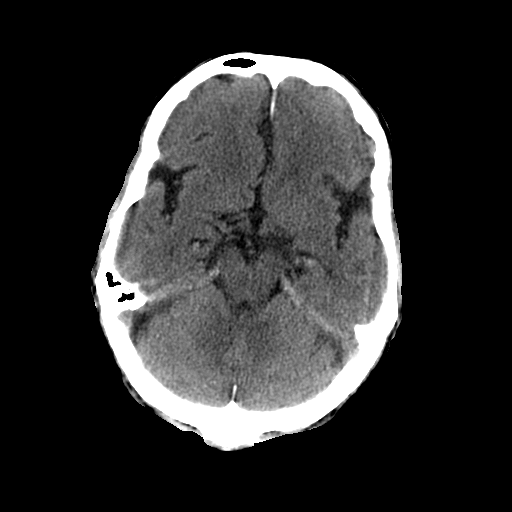
[im 10/28  brain]
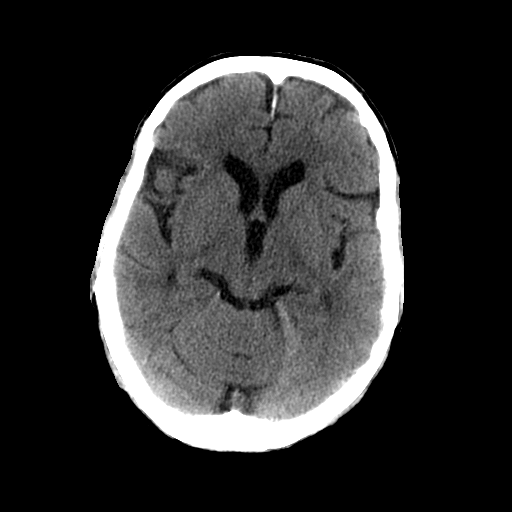
[im 10/28  bone]
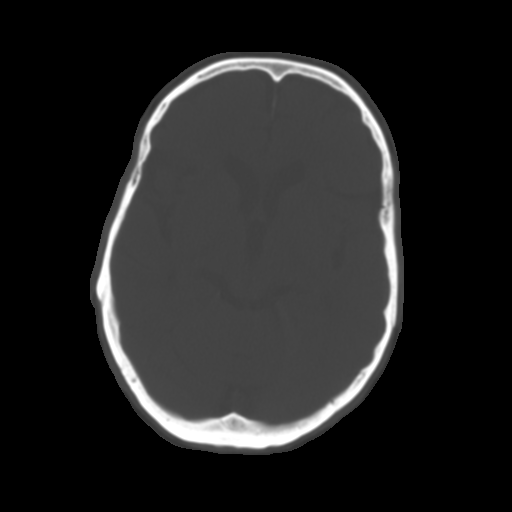
[im 12/28  brain]
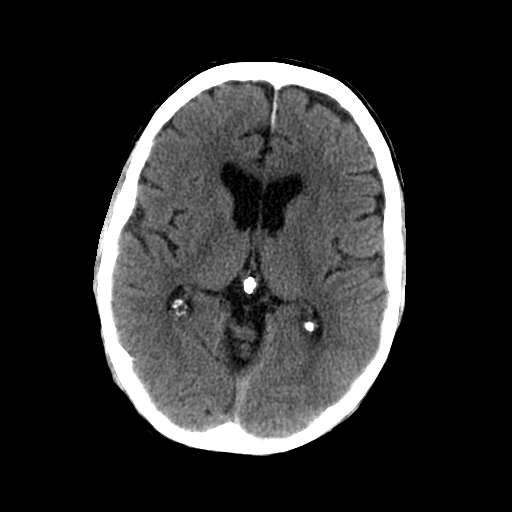
[im 14/28  brain]
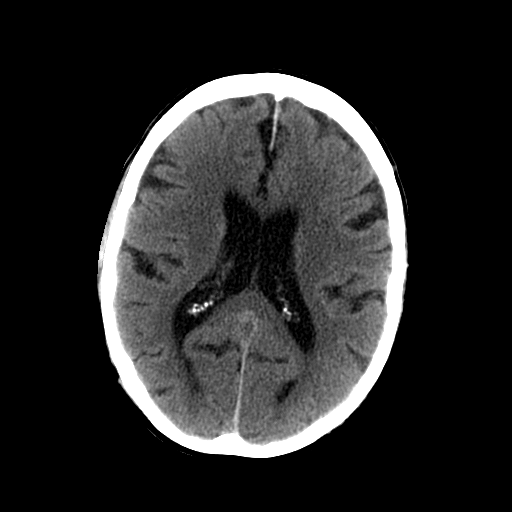
[im 16/28  brain]
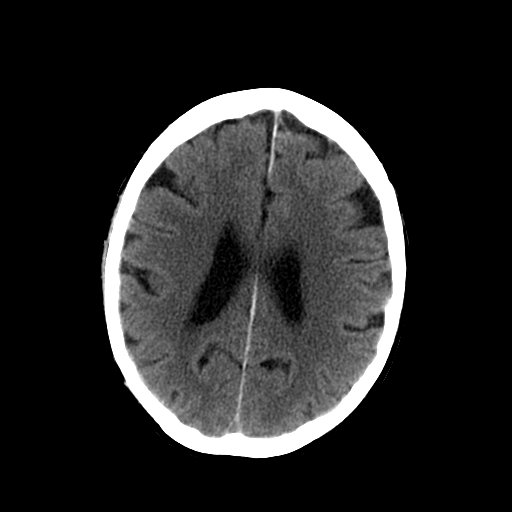
[im 18/28  brain]
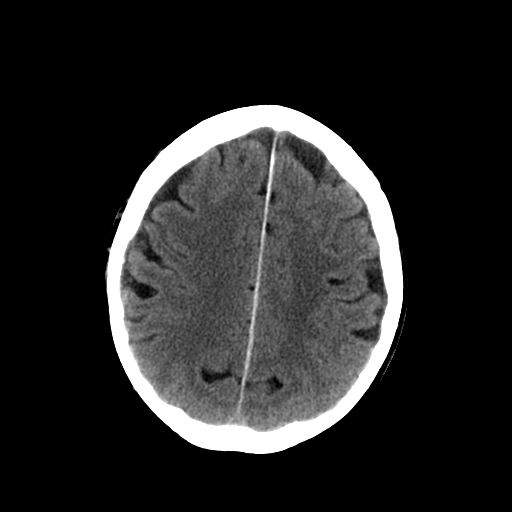
[im 18/28  bone]
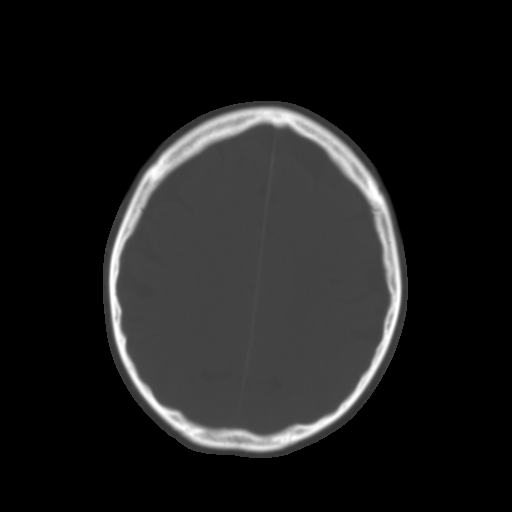
[im 20/28  brain]
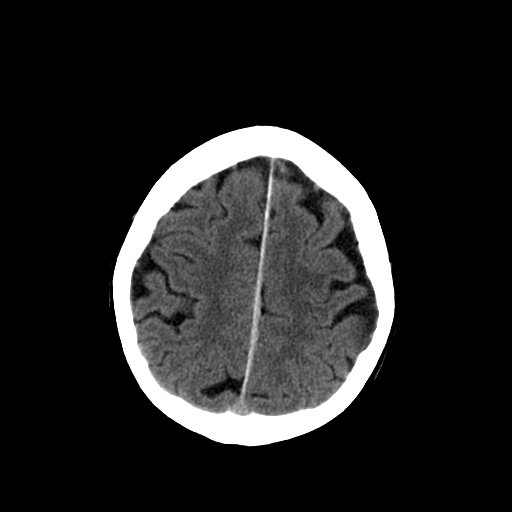
[im 22/28  brain]
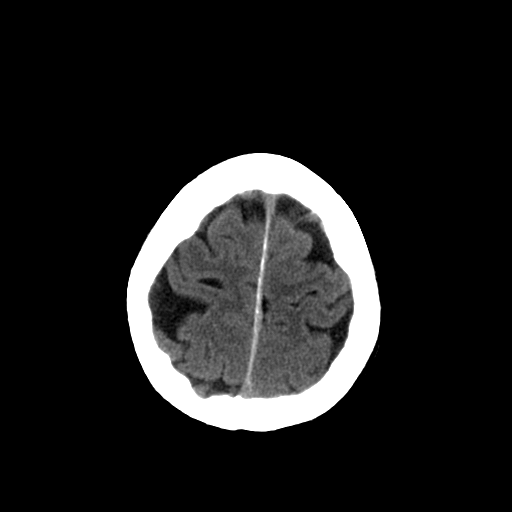
[im 24/28  brain]
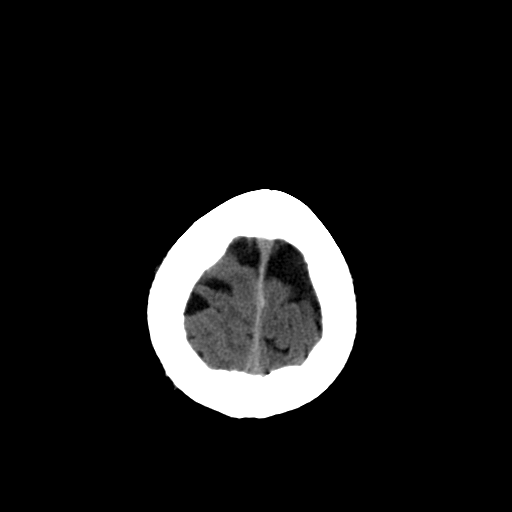
[im 26/28  brain]
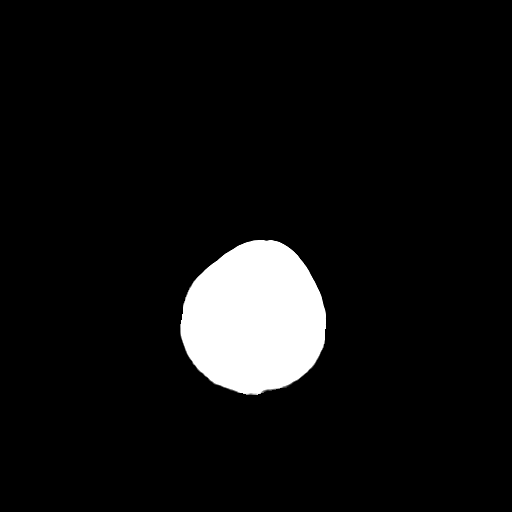
[im 26/28  bone]
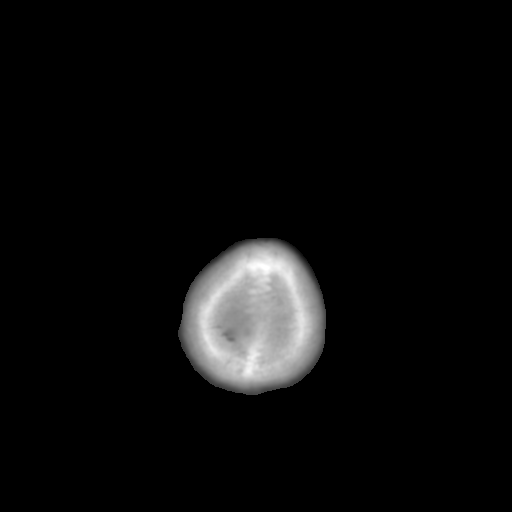

[13 of 30 positions shown; findings below may reference images not displayed]

FINDINGS: Calvarium appears stable and intact. Visualized paranasal
sinuses and mastoids are clear.  No scalp hematoma.  Decreased left
periorbital superficial hematoma.

Mildly decreased left para falcine subdural hematoma.  No
associated mass effect.  Small component along the left tentorium
and the left lateral convexity.  No new or increased intracranial
hemorrhage.  No intraventricular hemorrhage or ventriculomegaly.
No midline shift or other intracranial mass lesion. No evidence of
cortically based acute infarction identified.  No suspicious
intracranial vascular hyperdensity.
IMPRESSION: Left subdural hematoma present primarily along the falx has mildly
decreased since yesterday.  No new intracranial abnormality.

## 2013-10-20 ENCOUNTER — Other Ambulatory Visit: Payer: Self-pay | Admitting: *Deleted

## 2013-10-20 DIAGNOSIS — R918 Other nonspecific abnormal finding of lung field: Secondary | ICD-10-CM

## 2013-10-26 ENCOUNTER — Other Ambulatory Visit: Payer: Self-pay | Admitting: *Deleted

## 2013-10-26 MED ORDER — CLONAZEPAM 1 MG PO TABS: ORAL_TABLET | ORAL | Status: DC

## 2013-10-26 NOTE — Telephone Encounter (Signed)
Please call in klonopin with 1p refills

## 2013-10-26 NOTE — Telephone Encounter (Signed)
Last ov 08/27/13. Last refill 09/25/13. If approved route to nurse poll to call to [facility].

## 2013-10-27 NOTE — Telephone Encounter (Signed)
Left authorization on voicemail 

## 2013-11-12 ENCOUNTER — Ambulatory Visit (INDEPENDENT_AMBULATORY_CARE_PROVIDER_SITE_OTHER): Payer: Medicare HMO | Admitting: *Deleted

## 2013-11-12 DIAGNOSIS — Z23 Encounter for immunization: Secondary | ICD-10-CM

## 2013-11-30 ENCOUNTER — Ambulatory Visit (INDEPENDENT_AMBULATORY_CARE_PROVIDER_SITE_OTHER): Payer: Medicare HMO | Admitting: Nurse Practitioner

## 2013-11-30 ENCOUNTER — Encounter: Payer: Self-pay | Admitting: Nurse Practitioner

## 2013-11-30 VITALS — BP 153/80 | HR 96 | Temp 97.0°F | Ht 67.0 in | Wt 143.0 lb

## 2013-11-30 DIAGNOSIS — D62 Acute posthemorrhagic anemia: Secondary | ICD-10-CM

## 2013-11-30 DIAGNOSIS — Z09 Encounter for follow-up examination after completed treatment for conditions other than malignant neoplasm: Secondary | ICD-10-CM

## 2013-11-30 DIAGNOSIS — R231 Pallor: Secondary | ICD-10-CM

## 2013-11-30 DIAGNOSIS — J441 Chronic obstructive pulmonary disease with (acute) exacerbation: Secondary | ICD-10-CM

## 2013-11-30 LAB — POCT CBC
Granulocyte percent: 73.4 %G (ref 37–80)
HCT, POC: 33.6 % — AB (ref 43.5–53.7)
Hemoglobin: 10.6 g/dL — AB (ref 14.1–18.1)
Lymph, poc: 1.6 (ref 0.6–3.4)
MCH, POC: 28.9 pg (ref 27–31.2)
MCHC: 31.6 g/dL — AB (ref 31.8–35.4)
MCV: 91.3 fL (ref 80–97)
MPV: 5.8 fL (ref 0–99.8)
POC Granulocyte: 6.7 (ref 2–6.9)
POC LYMPH PERCENT: 18 %L (ref 10–50)
Platelet Count, POC: 271 10*3/uL (ref 142–424)
RBC: 3.7 M/uL — AB (ref 4.69–6.13)
RDW, POC: 13.4 %
WBC: 9.1 10*3/uL (ref 4.6–10.2)

## 2013-11-30 MED ORDER — HEMOCYTE PLUS 106-1 MG PO CAPS: 1.0000 | ORAL_CAPSULE | Freq: Every day | ORAL | Status: DC

## 2013-11-30 NOTE — Progress Notes (Signed)
Subjective:    Patient ID: Steven [name], male    DOB: Aug 18, 1928, 80 y.o.   MRN: [id number]  HPI Patient is here today complaining of wheezing, coughing and SOB. He went to [facility] on 10/23 for the same complaint. He was given doxycycline 100mg  BID for 7 days and prednisone 50mg  daily for 5 days. He reports not feeling better. He complains of weakness,He denies any fever. He was negative for influenza at [facility].   * he will be following up with a lung specialist in two days in [facility] for hx of lung CA.   Review of Systems  Constitutional: Negative for fever.  Respiratory: Positive for cough, shortness of breath and wheezing.   All other systems reviewed and are negative.      Objective:   Physical Exam  Constitutional: He is oriented to person, place, and time. He appears well-developed and well-nourished.  HENT:  Head: Normocephalic.  Neck: Normal range of motion.  Cardiovascular: Normal rate.   Pulmonary/Chest: Effort normal. He has wheezes.  Abdominal: Soft.  Musculoskeletal: Normal range of motion.  bil leg weakness.   Neurological: He is alert and oriented to person, place, and time.    BP 153/80  Pulse 96  Temp(Src) 97 F (36.1 C) (Oral)  Ht 5\' 7"  (1.702 m)  Wt 143 lb (64.864 kg)  BMI 22.39 kg/m2  Results for orders placed in visit on 11/30/13  POCT CBC      Result Value Ref Range   WBC 9.1  4.6 - 10.2 K/uL   Lymph, poc 1.6  0.6 - 3.4   POC LYMPH PERCENT 18.0  10 - 50 %L   MID (cbc)    0 - 0.9   POC MID %    0 - 12 %M   POC Granulocyte 6.7  2 - 6.9   Granulocyte percent 73.4  37 - 80 %G   RBC 3.7 (*) 4.69 - 6.13 M/uL   Hemoglobin 10.6 (*) 14.1 - 18.1 g/dL   HCT, POC 33.6 (*) 43.5 - 53.7 %   MCV 91.3  80 - 97 fL   MCH, POC 28.9  27 - 31.2 pg   MCHC 31.6 (*) 31.8 - 35.4 g/dL   RDW, POC 13.4     Platelet Count, POC 271.0  142 - 424 K/uL   MPV 5.8  0 - 99.8 fL        Assessment & Plan:   Problem List Items Addressed This Visit     Respiratory   COPD exacerbation   Relevant Medications      predniSONE (DELTASONE) 50 MG tablet     Other   Acute blood loss anemia - Primary   Relevant Medications      Fe Fum-FA-B Cmp-C-Zn-Mg-Mn-Cu (HEMOCYTE PLUS) 106-1 MG CAPS   Other Relevant Orders      POCT CBC (Completed)    Hospital follow up Records reviewed from discharge summary  Orders Placed This Encounter  Procedures  . POCT CBC   Meds ordered this encounter  Medications  . doxycycline (VIBRA-TABS) 100 MG tablet    Sig:   . predniSONE (DELTASONE) 50 MG tablet    Sig:   . Fe Fum-FA-B Cmp-C-Zn-Mg-Mn-Cu (HEMOCYTE PLUS) 106-1 MG CAPS    Sig: Take 1 tablet by mouth daily.    Dispense:  30 each    Refill:  5    Order Specific Question:  Supervising Provider    Answer:  Chipper Herb [phone]  Back on advair one puff BID Keep appointment with pulmonologist  Iron supplement daily Recheck hemoglobin friday hemoccult cards given to patient with directions  Follow up in 4 days  [place], [name]

## 2013-11-30 NOTE — Patient Instructions (Addendum)

## 2013-12-02 ENCOUNTER — Ambulatory Visit (INDEPENDENT_AMBULATORY_CARE_PROVIDER_SITE_OTHER): Payer: Commercial Managed Care - HMO | Admitting: Surgery

## 2013-12-02 ENCOUNTER — Ambulatory Visit
Admission: RE | Admit: 2013-12-02 | Discharge: 2013-12-02 | Disposition: A | Discharge status: Home / Self Care | Payer: Commercial Managed Care - HMO | Source: Ambulatory Visit | Attending: Surgery | Admitting: Surgery

## 2013-12-02 ENCOUNTER — Encounter: Payer: Self-pay | Admitting: Surgery

## 2013-12-02 VITALS — BP 113/74 | HR 81 | Ht 67.0 in | Wt 143.0 lb

## 2013-12-02 DIAGNOSIS — Z85118 Personal history of other malignant neoplasm of bronchus and lung: Secondary | ICD-10-CM

## 2013-12-02 DIAGNOSIS — R918 Other nonspecific abnormal finding of lung field: Secondary | ICD-10-CM

## 2013-12-03 ENCOUNTER — Encounter: Payer: Self-pay | Admitting: Surgery

## 2013-12-03 NOTE — Progress Notes (Signed)
HPI:  Patient returns for routine postoperative follow-up having undergone flexible fiberoptic bronchoscopy, right video-assisted thoracoscopy, and right thoracotomy with wedge resection of the right lower lobe lung mass on 11/07/2011. The final pathology showed a 1.6 cm poorly differentiated squamous cell carcinoma with negative margins. He denies any chest pain. He has chronic dyspnea with exertion. His son is with him today they report that he has been very weak recently and was seen by his [name] on 11/30/2013 and had a Hgb checked that was 10.6 which is just a little lower than he was in 2013. He denies any headaches or visual changes. He has had some cough and wheezing recently and was started on doxycycline and prednisone at [facility] on 11/27/2013.  He has no bone or muscle pain. His appetite has been good and his weight has been stable.     Current Outpatient Prescriptions  Medication Sig Dispense Refill  . aspirin EC 81 MG tablet Take 81 mg by mouth daily.      [name] atorvastatin (LIPITOR) 10 MG tablet Take 1 tablet (10 mg total) by mouth daily.  90 tablet  1  . clonazePAM (KLONOPIN) 1 MG tablet TAKE ONE TABLET BY MOUTH THREE TIMES DAILY  90 tablet  0  . doxazosin (CARDURA) 2 MG tablet Take 1 tablet (2 mg total) by mouth daily.  90 tablet  1  . doxycycline (VIBRA-TABS) 100 MG tablet       . Fe Fum-FA-B Cmp-C-Zn-Mg-Mn-Cu (HEMOCYTE PLUS) 106-1 MG CAPS Take 1 tablet by mouth daily.  30 each  5  . predniSONE (DELTASONE) 50 MG tablet        No current facility-administered medications for this visit.     Physical Exam: BP 113/74  Pulse 81  Ht 5\' 7"  (1.702 m)  Wt 143 lb (64.864 kg)  BMI 22.39 kg/m2  SpO2 96% He is a thin, chronically-ill appearing gentleman in no distress There is no cervical or supraclavicular adenopathy.  Lung exam is clear.  The right thoracotomy scar is well-healed and there are no skin lesions.  Cardiac exam shows a regular rate and rhythm with normal heart  sounds.  Abdominal exam shows active bowel sounds. Abdomen is soft, nontender, with no palpable masses or organomegaly.   Diagnostic Tests:  CLINICAL DATA: Followup indeterminate pulmonary nodules. Personal  history of the right lung squamous cell carcinoma and skin cancer.  EXAM:  CT CHEST WITHOUT CONTRAST  TECHNIQUE:  Multidetector CT imaging of the chest was performed following the  standard protocol without IV contrast.  COMPARISON: 06/03/2013 and earlier exams dating back to 10/17/2011  FINDINGS:  Mediastinum/Hilar Regions: No masses or pathologically enlarged  lymph nodes identified.  Other Thoracic Lymphadenopathy: None.  Lungs: Postsurgical changes in right lower lobe with associated  bronchiectasis remains stable. Scattered tiny bilateral pulmonary  nodules measuring up to 4 mm are also unchanged, consistent with  benign etiology. Pulmonary emphysema again noted.  Pleura: No evidence of effusion or mass.  Vascular/Cardiac: No acute findings identified.  Other: None.  Musculoskeletal: No suspicious bone lesions identified.  IMPRESSION:  Stable postop changes in right lower lobe. Stable scattered  bilateral pulmonary nodules measuring 4 mm in size or less,  consistent with benign etiology. No evidence of recurrent or  metastatic carcinoma within the thorax.  Electronically Signed  By: Earle Gell M.D.  On: 12/02/2013 12:47   Impression:  There is no evidence of recurrent cancer in the chest. It is not clear why he has  had generalized weakness lately but he is going to follow up with his [name].   Plan:  I will see him back in 6 months with a CXR.

## 2013-12-04 ENCOUNTER — Ambulatory Visit (INDEPENDENT_AMBULATORY_CARE_PROVIDER_SITE_OTHER): Payer: Medicare HMO | Admitting: Nurse Practitioner

## 2013-12-04 ENCOUNTER — Encounter: Payer: Self-pay | Admitting: Nurse Practitioner

## 2013-12-04 VITALS — BP 131/58 | HR 88 | Temp 98.1°F | Ht 67.0 in | Wt 145.0 lb

## 2013-12-04 DIAGNOSIS — D62 Acute posthemorrhagic anemia: Secondary | ICD-10-CM

## 2013-12-04 LAB — POCT HEMOGLOBIN: Hemoglobin: 10.6 g/dL — AB (ref 14.1–18.1)

## 2013-12-04 MED ORDER — CLONAZEPAM 1 MG PO TABS: ORAL_TABLET | ORAL | Status: DC

## 2013-12-04 NOTE — Progress Notes (Signed)
   Subjective:    Patient ID: Steven [name], male    DOB: [birth year], 80 y.o.   MRN: [id number]  HPI Patient is here today following up on hemoglobin. He was complaining of SOB earlier this week. He was pale when he was on our office on Monday 10/26. His Hbg was checked. He follow up with his lung specialist two days ago and stated he is feeling much better.    Review of Systems  All other systems reviewed and are negative.      Objective:   Physical Exam  Constitutional: He is oriented to person, place, and time. He appears well-developed and well-nourished.  HENT:  Head: Normocephalic.  Eyes: Pupils are equal, round, and reactive to light.  Neck: Normal range of motion.  Cardiovascular: Normal rate and regular rhythm.   Pulmonary/Chest: Effort normal and breath sounds normal.  Musculoskeletal: Normal range of motion.  Neurological: He is alert and oriented to person, place, and time.  Skin: Skin is warm.   BP 131/58  Pulse 88  Temp(Src) 98.1 F (36.7 C) (Oral)  Ht 5\' 7"  (1.702 m)  Wt 145 lb (65.772 kg)  BMI 22.71 kg/m2   Results for orders placed in visit on 12/04/13  POCT HEMOGLOBIN      Result Value Ref Range   Hemoglobin 10.6 (*) 14.1 - 18.1 g/dL       Assessment & Plan:   1. Acute blood loss anemia    Orders Placed This Encounter  Procedures  . POCT hemoglobin  recheck in 1 week Continue taking hemocyte plus Follow up if symptoms return [place], [name]

## 2013-12-04 NOTE — Addendum Note (Signed)
Addended by: [name] on: 12/04/2013 09:02 AM   Modules accepted: Orders

## 2013-12-04 NOTE — Addendum Note (Signed)
Addended by: Selmer Dominion on: 12/04/2013 11:18 AM   Modules accepted: Orders

## 2013-12-04 NOTE — Patient Instructions (Signed)
Anemia, Nonspecific Anemia is a condition in which the concentration of red blood cells or hemoglobin in the blood is below normal. Hemoglobin is a substance in red blood cells that carries oxygen to the tissues of the body. Anemia results in not enough oxygen reaching these tissues.  CAUSES  Common causes of anemia include:   Excessive bleeding. Bleeding may be internal or external. This includes excessive bleeding from periods (in women) or from the intestine.   Poor nutrition.   Chronic kidney, thyroid, and liver disease.  Bone marrow disorders that decrease red blood cell production.  Cancer and treatments for cancer.  HIV, AIDS, and their treatments.  Spleen problems that increase red blood cell destruction.  Blood disorders.  Excess destruction of red blood cells due to infection, medicines, and autoimmune disorders. SIGNS AND SYMPTOMS   Minor weakness.   Dizziness.   Headache.  Palpitations.   Shortness of breath, especially with exercise.   Paleness.  Cold sensitivity.  Indigestion.  Nausea.  Difficulty sleeping.  Difficulty concentrating. Symptoms may occur suddenly or they may develop slowly.  DIAGNOSIS  Additional blood tests are often needed. These help your health care provider determine the best treatment. Your health care provider will check your stool for blood and look for other causes of blood loss.  TREATMENT  Treatment varies depending on the cause of the anemia. Treatment can include:   Supplements of iron, vitamin B12, or folic acid.   Hormone medicines.   A blood transfusion. This may be needed if blood loss is severe.   Hospitalization. This may be needed if there is significant continual blood loss.   Dietary changes.  Spleen removal. HOME CARE INSTRUCTIONS Keep all follow-up appointments. It often takes many weeks to correct anemia, and having your health care provider check on your condition and your response to  treatment is very important. SEEK IMMEDIATE MEDICAL CARE IF:   You develop extreme weakness, shortness of breath, or chest pain.   You become dizzy or have trouble concentrating.  You develop heavy vaginal bleeding.   You develop a rash.   You have bloody or black, tarry stools.   You faint.   You vomit up blood.   You vomit repeatedly.   You have abdominal pain.  You have a fever or persistent symptoms for more than 2-3 days.   You have a fever and your symptoms suddenly get worse.   You are dehydrated.  MAKE SURE YOU:  Understand these instructions.  Will watch your condition.  Will get help right away if you are not doing well or get worse. Document Released: 03/01/2004 Document Revised: 09/24/2012 Document Reviewed: 07/18/2012 ExitCare Patient Information 2015 [employer]. This information is not intended to replace advice given to you by your health care provider. Make sure you discuss any questions you have with your health care provider.  

## 2013-12-06 LAB — FECAL OCCULT BLOOD, IMMUNOCHEMICAL: Fecal Occult Bld: POSITIVE — AB

## 2013-12-07 ENCOUNTER — Other Ambulatory Visit: Payer: Self-pay | Admitting: Nurse Practitioner

## 2013-12-07 DIAGNOSIS — R195 Other fecal abnormalities: Secondary | ICD-10-CM

## 2013-12-09 ENCOUNTER — Telehealth: Payer: Self-pay | Admitting: *Deleted

## 2014-01-08 ENCOUNTER — Encounter: Payer: Self-pay | Admitting: Nurse Practitioner

## 2014-01-25 ENCOUNTER — Other Ambulatory Visit: Payer: Self-pay | Admitting: Nurse Practitioner

## 2014-01-25 NOTE — Telephone Encounter (Signed)
Last seen 12/04/13  MMM If approved route to nurse to call into [facility]

## 2014-01-25 NOTE — Telephone Encounter (Signed)
Please call in klonopin with 1 refills 

## 2014-01-26 ENCOUNTER — Telehealth: Payer: Self-pay | Admitting: *Deleted

## 2014-01-26 NOTE — Telephone Encounter (Signed)
Script called in

## 2014-03-17 ENCOUNTER — Encounter: Payer: Self-pay | Admitting: Nurse Practitioner

## 2014-03-17 ENCOUNTER — Ambulatory Visit (INDEPENDENT_AMBULATORY_CARE_PROVIDER_SITE_OTHER): Payer: Commercial Managed Care - HMO | Admitting: Nurse Practitioner

## 2014-03-17 VITALS — BP 76/52 | HR 91 | Temp 97.0°F | Ht 67.0 in | Wt 140.0 lb

## 2014-03-17 DIAGNOSIS — N400 Enlarged prostate without lower urinary tract symptoms: Secondary | ICD-10-CM

## 2014-03-17 DIAGNOSIS — F411 Generalized anxiety disorder: Secondary | ICD-10-CM | POA: Diagnosis not present

## 2014-03-17 DIAGNOSIS — E785 Hyperlipidemia, unspecified: Secondary | ICD-10-CM | POA: Diagnosis not present

## 2014-03-17 DIAGNOSIS — D62 Acute posthemorrhagic anemia: Secondary | ICD-10-CM | POA: Diagnosis not present

## 2014-03-17 LAB — POCT HEMOGLOBIN: Hemoglobin: 11.9 g/dL — AB (ref 14.1–18.1)

## 2014-03-17 MED ORDER — DOXAZOSIN MESYLATE 2 MG PO TABS: 2.0000 mg | ORAL_TABLET | Freq: Every day | ORAL | Status: DC

## 2014-03-17 MED ORDER — ATORVASTATIN CALCIUM 10 MG PO TABS: 10.0000 mg | ORAL_TABLET | Freq: Every day | ORAL | Status: DC

## 2014-03-17 MED ORDER — CLONAZEPAM 1 MG PO TABS: 1.0000 mg | ORAL_TABLET | Freq: Three times a day (TID) | ORAL | Status: DC

## 2014-03-17 NOTE — Patient Instructions (Signed)

## 2014-03-17 NOTE — Progress Notes (Addendum)
  Subjective:    Patient ID: Steven [name], male    DOB: 07/07/1928, 81 y.o.   MRN: [id number]  Patient here today for follow up of chronic medical conditions. Patient say sthat he feels good  Hyperlipidemia This is a chronic problem. The current episode started more than 1 year ago. The problem is controlled. Recent lipid tests were reviewed and are variable. Current antihyperlipidemic treatment includes statins. The current treatment provides moderate improvement of lipids. Compliance problems include adherence to diet and adherence to exercise.  Risk factors for coronary artery disease include dyslipidemia, male sex and a sedentary lifestyle.  BPH Currently on cardura which helps with his flow- ocassionaly has problems with urine- but usually does well. GAD Klonopin keeps him calm- no side effects  Review of Systems  All other systems reviewed and are negative.      Objective:   Physical Exam  Constitutional: He is oriented to person, place, and time. He appears well-developed and well-nourished.  HENT:  Head: Normocephalic.  Right Ear: External ear normal.  Left Ear: External ear normal.  Eyes: Pupils are equal, round, and reactive to light.  Neck: Normal range of motion. Neck supple. No thyromegaly present.  Cardiovascular: Normal rate, regular rhythm and intact distal pulses.   Murmur heard. Pulmonary/Chest: Effort normal and breath sounds normal.  Abdominal: Soft. Bowel sounds are normal. He exhibits no distension. There is no tenderness.  Musculoskeletal: Normal range of motion. He exhibits no edema.  Neurological: He is alert and oriented to person, place, and time.  Skin: Skin is warm and dry. There is pallor.  Psychiatric: He has a normal mood and affect. His behavior is normal. Judgment and thought content normal.    BP 76/52 mmHg  Pulse 91  Temp(Src) 97 F (36.1 C) (Oral)  Ht 5' 7" (1.702 m)  Wt 140 lb (63.504 kg)  BMI 21.92 kg/m2  Results for orders placed or  performed in visit on 03/17/14  POCT hemoglobin  Result Value Ref Range   Hemoglobin 11.9 (A) 14.1 - 18.1 g/dL        Assessment & Plan:   1. BPH (benign prostatic hyperplasia) Continue cardura  2. Hyperlipidemia with target LDL less than 100 Low fat diet - CMP14+EGFR - NMR, lipoprofile - EKG 12-Lead  3. Anxiety state Stress management  4. Acute blood loss anemia Make sure stays on iron supplement daily miralax OTC if has constipation from iron supplements - POCT hemoglobin  5. hypotension Add salt to diet  Labs pending Health maintenance reviewed Diet and exercise encouraged Continue all meds Follow up  In 3 month   [place], [name]

## 2014-03-18 LAB — CMP14+EGFR
ALT: 10 IU/L (ref 0–44)
AST: 22 IU/L (ref 0–40)
Albumin/Globulin Ratio: 1.4 (ref 1.1–2.5)
Albumin: 4.1 g/dL (ref 3.5–4.7)
Alkaline Phosphatase: 74 IU/L (ref 39–117)
BUN/Creatinine Ratio: 14 (ref 10–22)
BUN: 20 mg/dL (ref 8–27)
Bilirubin Total: 0.3 mg/dL (ref 0.0–1.2)
CO2: 25 mmol/L (ref 18–29)
Calcium: 9.9 mg/dL (ref 8.6–10.2)
Chloride: 103 mmol/L (ref 97–108)
Creatinine, Ser: 1.46 mg/dL — ABNORMAL HIGH (ref 0.76–1.27)
GFR calc Af Amer: 51 mL/min/{1.73_m2} — ABNORMAL LOW (ref >59)
GFR calc non Af Amer: 44 mL/min/{1.73_m2} — ABNORMAL LOW (ref >59)
Globulin, Total: 2.9 g/dL (ref 1.5–4.5)
Glucose: 106 mg/dL — ABNORMAL HIGH (ref 65–99)
Potassium: 4.9 mmol/L (ref 3.5–5.2)
Sodium: 143 mmol/L (ref 134–144)
Total Protein: 7 g/dL (ref 6.0–8.5)

## 2014-03-18 LAB — NMR, LIPOPROFILE
Cholesterol: 114 mg/dL (ref 100–199)
HDL Cholesterol by NMR: 46 mg/dL (ref >39)
HDL Particle Number: 33.7 umol/L (ref >=30.5)
LDL Particle Number: 461 nmol/L (ref <1000)
LDL Size: 20.9 nm (ref >20.5)
LDL-C: 50 mg/dL (ref 0–99)
LP-IR Score: 46 — ABNORMAL HIGH (ref <=45)
Small LDL Particle Number: 266 nmol/L (ref <=527)
Triglycerides by NMR: 88 mg/dL (ref 0–149)

## 2014-04-26 ENCOUNTER — Other Ambulatory Visit: Payer: Self-pay | Admitting: Nurse Practitioner

## 2014-04-26 NOTE — Telephone Encounter (Signed)
Refill called to pharmacy.

## 2014-04-26 NOTE — Telephone Encounter (Signed)
Last filled 03/29/04, last seen 03/17/14. Call into [facility]

## 2014-04-26 NOTE — Telephone Encounter (Signed)
Please call in klonopin with 1 refills 

## 2014-05-11 ENCOUNTER — Encounter: Payer: Self-pay | Admitting: Nurse Practitioner

## 2014-05-11 ENCOUNTER — Ambulatory Visit (INDEPENDENT_AMBULATORY_CARE_PROVIDER_SITE_OTHER): Payer: Commercial Managed Care - HMO | Admitting: Nurse Practitioner

## 2014-05-11 VITALS — BP 161/76 | HR 80 | Temp 97.3°F | Ht 67.0 in | Wt 144.0 lb

## 2014-05-11 DIAGNOSIS — H6123 Impacted cerumen, bilateral: Secondary | ICD-10-CM

## 2014-05-11 NOTE — Patient Instructions (Signed)
Cerumen Impaction °A cerumen impaction is when the wax in your ear forms a plug. This plug usually causes reduced hearing. Sometimes it also causes an earache or dizziness. Removing a cerumen impaction can be difficult and painful. The wax sticks to the ear canal. The canal is sensitive and bleeds easily. If you try to remove a heavy wax buildup with a cotton tipped swab, you may push it in further. °Irrigation with water, suction, and small ear curettes may be used to clear out the wax. If the impaction is fixed to the skin in the ear canal, ear drops may be needed for a few days to loosen the wax. People who build up a lot of wax frequently can use ear wax removal products available in your local drugstore. °SEEK MEDICAL CARE IF:  °You develop an earache, increased hearing loss, or marked dizziness. °Document Released: 03/01/2004 Document Revised: 04/16/2011 Document Reviewed: 04/21/2009 °ExitCare® Patient Information ©2015 [employer]. This information is not intended to replace advice given to you by your health care provider. Make sure you discuss any questions you have with your health care provider. ° °

## 2014-05-11 NOTE — Progress Notes (Signed)
  Subjective:     Steven [name] is a 81 y.o. male who presents with ear pain and possible ear infection. Symptoms include: plugged sensation in both ears. Onset of symptoms was 3 days ago, and have been unchanged since that time. Associated symptoms include: none.  Patient denies: any associated symptoms. [name] He is drinking moderate amounts of fluids.  The following portions of the patient's history were reviewed and updated as appropriate: allergies, current medications, past family history, past medical history, past social history, past surgical history and problem list.  Review of Systems Pertinent items are noted in HPI.   Objective:    BP 161/76 mmHg  Pulse 80  Temp(Src) 97.3 F (36.3 C) (Oral)  Ht 5\' 7"  (1.702 m)  Wt 144 lb (65.318 kg)  BMI 22.55 kg/m2 General:  alert, cooperative and appears stated age  Right Ear: Pearly grey, cerumen removed  Left Ear: normal landmarks and mobility  Mouth:  lips, mucosa, and tongue normal; teeth and gums normal  Neck: no adenopathy, no JVD, supple, symmetrical, trachea midline and thyroid not enlarged, symmetric, no tenderness/mass/nodules    Ear irrigation with curetting- TM'S normal Assessment:   Cerumen impaction bil ears  Plan:    Use OTC ear drop  RTO PRN  Mary-Margaret Hassell Done, [name]

## 2014-06-08 ENCOUNTER — Other Ambulatory Visit: Payer: Self-pay | Admitting: Surgery

## 2014-06-08 DIAGNOSIS — C3490 Malignant neoplasm of unspecified part of unspecified bronchus or lung: Secondary | ICD-10-CM

## 2014-06-09 ENCOUNTER — Ambulatory Visit (INDEPENDENT_AMBULATORY_CARE_PROVIDER_SITE_OTHER): Payer: Commercial Managed Care - HMO | Admitting: Surgery

## 2014-06-09 ENCOUNTER — Ambulatory Visit
Admission: RE | Admit: 2014-06-09 | Discharge: 2014-06-09 | Disposition: A | Discharge status: Home / Self Care | Payer: Commercial Managed Care - HMO | Source: Ambulatory Visit | Attending: Surgery | Admitting: Surgery

## 2014-06-09 ENCOUNTER — Encounter: Payer: Self-pay | Admitting: Surgery

## 2014-06-09 VITALS — BP 113/70 | HR 85 | Resp 16 | Ht 67.0 in | Wt 144.0 lb

## 2014-06-09 DIAGNOSIS — C3490 Malignant neoplasm of unspecified part of unspecified bronchus or lung: Secondary | ICD-10-CM

## 2014-06-09 DIAGNOSIS — R918 Other nonspecific abnormal finding of lung field: Secondary | ICD-10-CM

## 2014-06-09 DIAGNOSIS — Z85118 Personal history of other malignant neoplasm of bronchus and lung: Secondary | ICD-10-CM

## 2014-06-09 DIAGNOSIS — J449 Chronic obstructive pulmonary disease, unspecified: Secondary | ICD-10-CM | POA: Diagnosis not present

## 2014-06-09 DIAGNOSIS — Z951 Presence of aortocoronary bypass graft: Secondary | ICD-10-CM | POA: Diagnosis not present

## 2014-06-09 NOTE — Progress Notes (Signed)
      HPI:  Patient returns for routine postoperative follow-up having undergone flexible fiberoptic bronchoscopy, right video-assisted thoracoscopy, and right thoracotomy with wedge resection of the right lower lobe lung mass on 11/07/2011. The final pathology showed a 1.6 cm poorly differentiated squamous cell carcinoma with negative margins. He denies any chest pain. He has chronic dyspnea with exertion but says he feels fairly well and has no complaints.  He denies any headaches or visual changes. He denies cough and chest pain.  He has no bone or muscle pain. His appetite has been good and his weight has been stable.   Current Outpatient Prescriptions  Medication Sig Dispense Refill  . aspirin EC 81 MG tablet Take 81 mg by mouth daily.    [name] atorvastatin (LIPITOR) 10 MG tablet Take 1 tablet (10 mg total) by mouth daily. 90 tablet 1  . clonazePAM (KLONOPIN) 1 MG tablet TAKE ONE TABLET BY MOUTH THREE TIMES DAILY 90 tablet 1  . doxazosin (CARDURA) 2 MG tablet Take 1 tablet (2 mg total) by mouth daily. 90 tablet 1  . Fe Fum-FA-B Cmp-C-Zn-Mg-Mn-Cu (HEMOCYTE PLUS) 106-1 MG CAPS Take 1 tablet by mouth daily. 30 each 5   No current facility-administered medications for this visit.     Physical Exam:  BP 113/70 mmHg  Pulse 85  Resp 16  Ht '5\' 7"'$  (1.702 m)  Wt 144 lb (65.318 kg)  BMI 22.55 kg/m2  SpO2 96% He is a thin, chronically-ill appearing gentleman in no distress There is no cervical or supraclavicular adenopathy.  Lung exam is clear.  The right thoracotomy scar is well-healed and there are no skin lesions.  Cardiac exam shows a regular rate and rhythm with normal heart sounds.  Abdominal exam shows active bowel sounds. Abdomen is soft, nontender, with no palpable masses or organomegaly.    Diagnostic Tests:  CLINICAL DATA: Lung cancer.  EXAM: CHEST 2 VIEW  COMPARISON: None.  FINDINGS: Mediastinum and hilar structures normal. Prior CABG. Heart size normal.  Changes of COPD and pleural parenchymal scarring. No acute pulmonary infiltrate. No acute bony abnormality.  IMPRESSION: 1. COPD and pleural parenchymal scarring. 2. Prior CABG. No cardiomegaly.   Electronically Signed  By: Marcello Moores Register  On: 06/09/2014 13:07   Impression:  There is no evidence of recurrent cancer in the chest.  Plan:  I will see him back in 6 months with a CXR   Gaye Pollack, MD [facility] [phone]

## 2014-06-18 ENCOUNTER — Ambulatory Visit (INDEPENDENT_AMBULATORY_CARE_PROVIDER_SITE_OTHER): Payer: Commercial Managed Care - HMO | Admitting: Nurse Practitioner

## 2014-06-18 ENCOUNTER — Encounter: Payer: Self-pay | Admitting: Nurse Practitioner

## 2014-06-18 VITALS — BP 144/82 | HR 81 | Temp 97.8°F | Ht 67.0 in | Wt 143.0 lb

## 2014-06-18 DIAGNOSIS — J439 Emphysema, unspecified: Secondary | ICD-10-CM

## 2014-06-18 DIAGNOSIS — C3491 Malignant neoplasm of unspecified part of right bronchus or lung: Secondary | ICD-10-CM | POA: Diagnosis not present

## 2014-06-18 DIAGNOSIS — I2510 Atherosclerotic heart disease of native coronary artery without angina pectoris: Secondary | ICD-10-CM | POA: Diagnosis not present

## 2014-06-18 DIAGNOSIS — Z23 Encounter for immunization: Secondary | ICD-10-CM

## 2014-06-18 DIAGNOSIS — N400 Enlarged prostate without lower urinary tract symptoms: Secondary | ICD-10-CM | POA: Diagnosis not present

## 2014-06-18 DIAGNOSIS — I10 Essential (primary) hypertension: Secondary | ICD-10-CM

## 2014-06-18 DIAGNOSIS — D62 Acute posthemorrhagic anemia: Secondary | ICD-10-CM | POA: Diagnosis not present

## 2014-06-18 DIAGNOSIS — E785 Hyperlipidemia, unspecified: Secondary | ICD-10-CM

## 2014-06-18 DIAGNOSIS — F411 Generalized anxiety disorder: Secondary | ICD-10-CM | POA: Diagnosis not present

## 2014-06-18 MED ORDER — HEMOCYTE PLUS 106-1 MG PO CAPS: 1.0000 | ORAL_CAPSULE | Freq: Every day | ORAL | Status: DC

## 2014-06-18 MED ORDER — CLONAZEPAM 1 MG PO TABS: 1.0000 mg | ORAL_TABLET | Freq: Three times a day (TID) | ORAL | Status: DC

## 2014-06-18 NOTE — Progress Notes (Signed)
  Subjective:    Patient ID: Steven [name], male    DOB: 1928/12/19, 81 y.o.   MRN: [id number]  Patient here today for follow up of chronic medical conditions. Patient say sthat he feels good  Hyperlipidemia This is a chronic problem. The current episode started more than 1 year ago. The problem is controlled. Recent lipid tests were reviewed and are variable. Current antihyperlipidemic treatment includes statins. The current treatment provides moderate improvement of lipids. Compliance problems include adherence to diet and adherence to exercise.  Risk factors for coronary artery disease include dyslipidemia, male sex and a sedentary lifestyle.  BPH Currently on cardura which helps with his flow- ocassionaly has problems with urine- but usually does well. GAD Klonopin keeps him calm- no side effects Squamous cell carcinoma of lung resolved Has had all treatmentts- Saw oncologist on May 4,2016 COPD Currently on no inhalers- doing well - no longer smoking- denies SOB anemia Taking iron supplements- not sure of cause of anemia CAD On no meds- no longer sees cardiologist     Review of Systems  Constitutional: Negative.   HENT: Negative.   Respiratory: Negative.   Cardiovascular: Negative.   Genitourinary: Negative.   Neurological: Negative.   Psychiatric/Behavioral: Negative.   All other systems reviewed and are negative.      Objective:   Physical Exam  Constitutional: He is oriented to person, place, and time. He appears well-developed and well-nourished.  HENT:  Head: Normocephalic.  Right Ear: External ear normal.  Left Ear: External ear normal.  Eyes: Pupils are equal, round, and reactive to light.  Neck: Normal range of motion. Neck supple. No thyromegaly present.  Cardiovascular: Normal rate, regular rhythm and intact distal pulses.   Murmur heard. Pulmonary/Chest: Effort normal. He has wheezes (exp wheezes).  Abdominal: Soft. Bowel sounds are normal. He exhibits no  distension. There is no tenderness.  Musculoskeletal: Normal range of motion. He exhibits no edema.  Neurological: He is alert and oriented to person, place, and time.  Skin: Skin is warm and dry. There is pallor.  Psychiatric: He has a normal mood and affect. His behavior is normal. Judgment and thought content normal.    BP 144/82 mmHg  Pulse 81  Temp(Src) 97.8 F (36.6 C) (Oral)  Ht $R'5\' 7"'SN$  (1.702 m)  Wt 143 lb (64.864 kg)  BMI 22.39 kg/m2        Assessment & Plan:   1. BPH (benign prostatic hyperplasia) - PSA, total and free  2. Pulmonary emphysema, unspecified emphysema type   3. Hyperlipidemia with target LDL less than 100 Low fat diet - NMR, lipoprofile  4. Anxiety state Stress managemnet - clonazePAM (KLONOPIN) 1 MG tablet; Take 1 tablet (1 mg total) by mouth 3 (three) times daily.  Dispense: 90 tablet; Refill: 1  5. Essential hypertension Do not add salt to diet - CMP14+EGFR  6. Atherosclerosis of native coronary artery of native heart without angina pectoris  7. Squamous cell carcinoma of lung, right Keep follow up appointment with oncologist  8. Acute blood loss anemia - Fe Fum-FA-B Cmp-C-Zn-Mg-Mn-Cu (HEMOCYTE PLUS) 106-1 MG CAPS; Take 1 tablet by mouth daily.  Dispense: 30 each; Refill: 5 - Anemia Profile B   prevnar 13 today Labs pending Health maintenance reviewed Diet and exercise encouraged Continue all meds Follow up  In 3 months   [place], [name]

## 2014-06-18 NOTE — Patient Instructions (Signed)

## 2014-06-18 NOTE — Addendum Note (Signed)
Addended by: Rolena Infante on: 06/18/2014 11:32 AM   Modules accepted: Orders

## 2014-08-27 ENCOUNTER — Other Ambulatory Visit: Payer: Self-pay | Admitting: Nurse Practitioner

## 2014-08-27 NOTE — Telephone Encounter (Signed)
Last filled 07/29/14, last seen 06/18/14. Route to pool, nurse call into [place]

## 2014-08-30 NOTE — Telephone Encounter (Signed)
Refill called to [facility]

## 2014-08-30 NOTE — Telephone Encounter (Signed)
Please call in klonopin with 1 refills 

## 2014-09-11 ENCOUNTER — Other Ambulatory Visit: Payer: Self-pay | Admitting: Nurse Practitioner

## 2014-09-28 ENCOUNTER — Observation Stay (HOSPITAL_COMMUNITY)
Admission: EM | Admit: 2014-09-28 | Discharge: 2014-09-30 | Disposition: A | Discharge status: Home / Self Care | Payer: Commercial Managed Care - HMO | Attending: Internal Medicine | Admitting: Internal Medicine

## 2014-09-28 ENCOUNTER — Encounter (HOSPITAL_COMMUNITY): Payer: Self-pay | Admitting: *Deleted

## 2014-09-28 DIAGNOSIS — F411 Generalized anxiety disorder: Secondary | ICD-10-CM | POA: Diagnosis present

## 2014-09-28 DIAGNOSIS — K625 Hemorrhage of anus and rectum: Secondary | ICD-10-CM | POA: Diagnosis not present

## 2014-09-28 DIAGNOSIS — C3490 Malignant neoplasm of unspecified part of unspecified bronchus or lung: Secondary | ICD-10-CM | POA: Diagnosis present

## 2014-09-28 DIAGNOSIS — F17208 Nicotine dependence, unspecified, with other nicotine-induced disorders: Secondary | ICD-10-CM

## 2014-09-28 DIAGNOSIS — K529 Noninfective gastroenteritis and colitis, unspecified: Principal | ICD-10-CM | POA: Insufficient documentation

## 2014-09-28 DIAGNOSIS — F17200 Nicotine dependence, unspecified, uncomplicated: Secondary | ICD-10-CM

## 2014-09-28 DIAGNOSIS — K922 Gastrointestinal hemorrhage, unspecified: Secondary | ICD-10-CM | POA: Diagnosis not present

## 2014-09-28 DIAGNOSIS — J439 Emphysema, unspecified: Secondary | ICD-10-CM | POA: Diagnosis present

## 2014-09-28 DIAGNOSIS — I10 Essential (primary) hypertension: Secondary | ICD-10-CM | POA: Diagnosis present

## 2014-09-28 DIAGNOSIS — R945 Abnormal results of liver function studies: Secondary | ICD-10-CM

## 2014-09-28 DIAGNOSIS — R7401 Elevation of levels of liver transaminase levels: Secondary | ICD-10-CM

## 2014-09-28 DIAGNOSIS — R740 Nonspecific elevation of levels of transaminase and lactic acid dehydrogenase [LDH]: Secondary | ICD-10-CM

## 2014-09-28 DIAGNOSIS — R7989 Other specified abnormal findings of blood chemistry: Secondary | ICD-10-CM

## 2014-09-28 DIAGNOSIS — R404 Transient alteration of awareness: Secondary | ICD-10-CM | POA: Diagnosis not present

## 2014-09-28 DIAGNOSIS — R531 Weakness: Secondary | ICD-10-CM | POA: Diagnosis not present

## 2014-09-28 DIAGNOSIS — N183 Chronic kidney disease, stage 3 unspecified: Secondary | ICD-10-CM

## 2014-09-28 HISTORY — DX: Malignant (primary) neoplasm, unspecified: C80.1

## 2014-09-28 LAB — COMPREHENSIVE METABOLIC PANEL
ALT: 199 U/L — ABNORMAL HIGH (ref 17–63)
AST: 158 U/L — ABNORMAL HIGH (ref 15–41)
Albumin: 3.2 g/dL — ABNORMAL LOW (ref 3.5–5.0)
Alkaline Phosphatase: 129 U/L — ABNORMAL HIGH (ref 38–126)
Anion gap: 8 (ref 5–15)
BUN: 28 mg/dL — ABNORMAL HIGH (ref 6–20)
CO2: 27 mmol/L (ref 22–32)
Calcium: 8.6 mg/dL — ABNORMAL LOW (ref 8.9–10.3)
Chloride: 104 mmol/L (ref 101–111)
Creatinine, Ser: 1.29 mg/dL — ABNORMAL HIGH (ref 0.61–1.24)
GFR calc Af Amer: 57 mL/min — ABNORMAL LOW (ref >60)
GFR calc non Af Amer: 49 mL/min — ABNORMAL LOW (ref >60)
Glucose, Bld: 113 mg/dL — ABNORMAL HIGH (ref 65–99)
Potassium: 3.9 mmol/L (ref 3.5–5.1)
Sodium: 139 mmol/L (ref 135–145)
Total Bilirubin: 0.7 mg/dL (ref 0.3–1.2)
Total Protein: 6.7 g/dL (ref 6.5–8.1)

## 2014-09-28 LAB — CBC WITH DIFFERENTIAL/PLATELET
Basophils Absolute: 0 10*3/uL (ref 0.0–0.1)
Basophils Relative: 1 % (ref 0–1)
Eosinophils Absolute: 0.1 10*3/uL (ref 0.0–0.7)
Eosinophils Relative: 1 % (ref 0–5)
HCT: 33.9 % — ABNORMAL LOW (ref 39.0–52.0)
Hemoglobin: 10.9 g/dL — ABNORMAL LOW (ref 13.0–17.0)
Lymphocytes Relative: 24 % (ref 12–46)
Lymphs Abs: 1.3 10*3/uL (ref 0.7–4.0)
MCH: 31.1 pg (ref 26.0–34.0)
MCHC: 32.2 g/dL (ref 30.0–36.0)
MCV: 96.6 fL (ref 78.0–100.0)
Monocytes Absolute: 0.8 10*3/uL (ref 0.1–1.0)
Monocytes Relative: 16 % — ABNORMAL HIGH (ref 3–12)
Neutro Abs: 3.2 10*3/uL (ref 1.7–7.7)
Neutrophils Relative %: 59 % (ref 43–77)
Platelets: 136 10*3/uL — ABNORMAL LOW (ref 150–400)
RBC: 3.51 MIL/uL — ABNORMAL LOW (ref 4.22–5.81)
RDW: 13.2 % (ref 11.5–15.5)
WBC: 5.3 10*3/uL (ref 4.0–10.5)

## 2014-09-28 LAB — PROTIME-INR
INR: 1.14 (ref 0.00–1.49)
Prothrombin Time: 14.8 seconds (ref 11.6–15.2)

## 2014-09-28 MED ORDER — ATORVASTATIN CALCIUM 10 MG PO TABS: 10.0000 mg | ORAL_TABLET | Freq: Every day | ORAL | Status: DC

## 2014-09-28 MED ORDER — NICOTINE 14 MG/24HR TD PT24
14.0000 mg | MEDICATED_PATCH | Freq: Every day | TRANSDERMAL | Status: DC
  Administered 2014-09-28 – 2014-09-29 (×2): 14 mg via TRANSDERMAL
  Filled 2014-09-28 (×2): qty 1

## 2014-09-28 MED ORDER — SODIUM CHLORIDE 0.9 % IV SOLN
Freq: Once | INTRAVENOUS | Status: AC
  Administered 2014-09-28: 14:00:00 via INTRAVENOUS

## 2014-09-28 MED ORDER — PANTOPRAZOLE SODIUM 40 MG IV SOLR
40.0000 mg | Freq: Two times a day (BID) | INTRAVENOUS | Status: DC
  Administered 2014-09-29 – 2014-09-30 (×3): 40 mg via INTRAVENOUS
  Filled 2014-09-28 (×3): qty 40

## 2014-09-28 MED ORDER — CLONAZEPAM 0.5 MG PO TABS
1.0000 mg | ORAL_TABLET | Freq: Three times a day (TID) | ORAL | Status: DC
  Administered 2014-09-28 – 2014-09-30 (×5): 1 mg via ORAL
  Filled 2014-09-28 (×5): qty 2

## 2014-09-28 MED ORDER — ONDANSETRON HCL 4 MG/2ML IJ SOLN: 4.0000 mg | Freq: Four times a day (QID) | INTRAMUSCULAR | Status: DC | PRN

## 2014-09-28 MED ORDER — ZOLPIDEM TARTRATE 5 MG PO TABS
5.0000 mg | ORAL_TABLET | Freq: Once | ORAL | Status: AC
  Administered 2014-09-28: 5 mg via ORAL
  Filled 2014-09-28: qty 1

## 2014-09-28 MED ORDER — DOXYLAMINE SUCCINATE (SLEEP) 25 MG PO TABS
25.0000 mg | ORAL_TABLET | Freq: Every evening | ORAL | Status: DC | PRN
  Filled 2014-09-28: qty 1

## 2014-09-28 MED ORDER — SODIUM CHLORIDE 0.9 % IJ SOLN
3.0000 mL | Freq: Two times a day (BID) | INTRAMUSCULAR | Status: DC
  Administered 2014-09-30: 3 mL via INTRAVENOUS

## 2014-09-28 MED ORDER — HYDRALAZINE HCL 20 MG/ML IJ SOLN: 5.0000 mg | INTRAMUSCULAR | Status: DC | PRN

## 2014-09-28 MED ORDER — DOXAZOSIN MESYLATE 2 MG PO TABS
2.0000 mg | ORAL_TABLET | Freq: Every day | ORAL | Status: DC
  Administered 2014-09-29 – 2014-09-30 (×2): 2 mg via ORAL
  Filled 2014-09-28 (×2): qty 1

## 2014-09-28 MED ORDER — SODIUM CHLORIDE 0.9 % IV SOLN
INTRAVENOUS | Status: DC
  Administered 2014-09-29: 07:00:00 via INTRAVENOUS

## 2014-09-28 MED ORDER — PANTOPRAZOLE SODIUM 40 MG PO TBEC
80.0000 mg | DELAYED_RELEASE_TABLET | Freq: Once | ORAL | Status: AC
  Administered 2014-09-28: 80 mg via ORAL
  Filled 2014-09-28: qty 2

## 2014-09-28 MED ORDER — ACETAMINOPHEN 325 MG PO TABS
650.0000 mg | ORAL_TABLET | Freq: Four times a day (QID) | ORAL | Status: DC | PRN
  Administered 2014-09-28: 650 mg via ORAL
  Filled 2014-09-28: qty 2

## 2014-09-28 MED ORDER — ACETAMINOPHEN 650 MG RE SUPP: 650.0000 mg | Freq: Four times a day (QID) | RECTAL | Status: DC | PRN

## 2014-09-28 MED ORDER — ONDANSETRON HCL 4 MG PO TABS: 4.0000 mg | ORAL_TABLET | Freq: Four times a day (QID) | ORAL | Status: DC | PRN

## 2014-09-28 NOTE — ED Notes (Signed)
Patient reports feeling fatigued and having diarrhea x 3 days. Also reports bloody stool this am.

## 2014-09-28 NOTE — ED Provider Notes (Signed)
CSN: [id number]     Arrival date & time 09/28/14  1308 History   First MD Initiated Contact with Patient 09/28/14 1310     Chief Complaint  Patient presents with  . Weakness      HPI  Patient presents for evaluation of weakness. States he feels a generalized weakness. When he stands he does not feel lightheaded or presyncopal. However, he states that if he walks even a little ways he is "out of gas". 2 nights ago he was awakened by a feeling that he had been incontinent of stool. He noticed a moderate amount of blood in his bed clothes and sheets. He had to change the sheets. It happened again two nights ago. Yesterday morning he had a small amount of blood with his stool as well. This morning had a bowel movement that was loose and liquid but does not describe bloody stool today. Progressively weak over the last 24 hours. He presents here.  Denies any history of prior GI bleed. Per review of his chart he had a previous Positive occult blood stool  in 2013. He states he remembers having a colonoscopy. Cannot recall the date. He "thinks" that it was normal.  Past Medical History  Diagnosis Date  . Hypertension   . Hyperlipidemia   . Anxiety   . Myocardial infarction 1999  . Cancer 2013    Lung - cancer free x3 years.    Past Surgical History  Procedure Laterality Date  . Cholecystectomy    . Coronary artery bypass graft  1999    5 grafts  . Hernia repair  4270    umbilical hernia repair  . Joint replacement  2000    right shoulder rotator cuff repair  . Carpal tunnel release  1980's    right and left  . Flexible bronchoscopy  11/07/2011    Procedure: FLEXIBLE BRONCHOSCOPY;  Surgeon: Gaye Pollack, MD;  [facility] OR;  Service: Thoracic;  Laterality: N/A;   Family History  Problem Relation Age of Onset  . Cancer Mother   . Pulmonary embolism Father    Social History  Substance Use Topics  . Smoking status: Former Smoker -- 2.00 packs/day for 2 years    Types: Cigarettes     Start date: 02/05/2006    Quit date: 11/05/2006  . Smokeless tobacco: Never Used  . Alcohol Use: No    Review of Systems  Constitutional: Negative for fever, chills, diaphoresis, appetite change and fatigue.  HENT: Negative for mouth sores, sore throat and trouble swallowing.   Eyes: Negative for visual disturbance.  Respiratory: Negative for cough, chest tightness, shortness of breath and wheezing.   Cardiovascular: Negative for chest pain.  Gastrointestinal: Positive for diarrhea and blood in stool. Negative for nausea, vomiting, abdominal pain and abdominal distention.  Endocrine: Negative for polydipsia, polyphagia and polyuria.  Genitourinary: Negative for dysuria, frequency and hematuria.  Musculoskeletal: Negative for gait problem.  Skin: Negative for color change, pallor and rash.  Neurological: Positive for weakness. Negative for dizziness, syncope, light-headedness and headaches.  Hematological: Does not bruise/bleed easily.  Psychiatric/Behavioral: Negative for behavioral problems and confusion.      Allergies  Review of patient's allergies indicates no known allergies.  Home Medications   Prior to Admission medications   Medication Sig Start Date End Date Taking? Authorizing Provider  aspirin EC 81 MG tablet Take 81 mg by mouth daily.   Yes Historical Provider, MD  atorvastatin (LIPITOR) 10 MG tablet TAKE ONE TABLET BY  MOUTH ONE TIME DAILY 09/13/14  Yes Mary-Margaret Hassell Done, [name]  clonazePAM (KLONOPIN) 1 MG tablet TAKE ONE TABLET BY MOUTH THREE TIMES DAILY 08/30/14  Yes Mary-Margaret Hassell Done, [name]  dextromethorphan-guaiFENesin (MUCINEX DM) 30-600 MG per 12 hr tablet Take 1 tablet by mouth 2 (two) times daily.   Yes Historical Provider, MD  doxazosin (CARDURA) 2 MG tablet TAKE ONE TABLET BY MOUTH ONE TIME DAILY 09/13/14  Yes Mary-Margaret Hassell Done, [name]  Doxylamine Succinate, Sleep, (SLEEP AID PO) Take 1 tablet by mouth at bedtime.   Yes Historical Provider, MD  Fe Fum-FA-B  Cmp-C-Zn-Mg-Mn-Cu (HEMOCYTE PLUS) 106-1 MG CAPS Take 1 tablet by mouth daily. Patient not taking: Reported on 09/28/2014 06/18/14   Mary-Margaret Hassell Done, [name]   BP 126/57 mmHg  Pulse 79  Temp(Src) 98.4 F (36.9 C) (Oral)  Resp 18  Ht '5\' 7"'$  (1.702 m)  Wt 140 lb (63.504 kg)  BMI 21.92 kg/m2  SpO2 96% Physical Exam  Constitutional: He is oriented to person, place, and time. He appears well-developed and well-nourished. No distress.  HENT:  Head: Normocephalic.  Eyes: Pupils are equal, round, and reactive to light. No scleral icterus.  Conjunctiva appear pale.  Neck: Normal range of motion. Neck supple. No thyromegaly present.  Cardiovascular: Normal rate and regular rhythm.  Exam reveals no gallop and no friction rub.   No murmur heard. Pulmonary/Chest: Effort normal and breath sounds normal. No respiratory distress. He has no wheezes. He has no rales.  Abdominal: Soft. Bowel sounds are normal. He exhibits no distension. There is no tenderness. There is no rebound.  Genitourinary:  Abdomen is soft and benign. He denies pain or tenderness on exam. Rectal was brown stool. Occult blood positive.  No fissure, visible external hemorrhoids, or palpable internal rectal masses or hemorrhoids  Musculoskeletal: Normal range of motion.  Neurological: He is alert and oriented to person, place, and time.  Skin: Skin is warm and dry. No rash noted.  His nail beds appear pale.  Psychiatric: He has a normal mood and affect. His behavior is normal.    ED Course  Procedures (including critical care time) Labs Review Labs Reviewed  CBC WITH DIFFERENTIAL/PLATELET - Abnormal; Notable for the following:    RBC 3.51 (*)    Hemoglobin 10.9 (*)    HCT 33.9 (*)    Platelets 136 (*)    Monocytes Relative 16 (*)    All other components within normal limits  COMPREHENSIVE METABOLIC PANEL - Abnormal; Notable for the following:    Glucose, Bld 113 (*)    BUN 28 (*)    Creatinine, Ser 1.29 (*)    Calcium  8.6 (*)    Albumin 3.2 (*)    AST 158 (*)    ALT 199 (*)    Alkaline Phosphatase 129 (*)    GFR calc non Af Amer 49 (*)    GFR calc Af Amer 57 (*)    All other components within normal limits  PROTIME-INR  TYPE AND SCREEN    Imaging Review No results found. I have personally reviewed and evaluated these images and lab results as part of my medical decision-making.   EKG Interpretation   Date/Time:  Tuesday September 28 2014 13:32:17 EDT Ventricular Rate:  85 PR Interval:  141 QRS Duration: 93 QT Interval:  350 QTC Calculation: 416 R Axis:   78 Text Interpretation:  Sinus rhythm Borderline low voltage, extremity leads  Confirmed by Jeneen Rinks  MD, [place] ([id number]) on 09/28/2014 3:00:00 PM  MDM   Final diagnoses:  Lower GI bleed    Patient's hemoglobin is at his baseline. However he appears pale and still is symptomatic feeling weak. Discussed the case with Dr. Barbaraann Faster of hospitalist service. He in turn has discussed the case with GI. Patient is to be admitted. Serial hemoglobins. Planning a.m. lower endoscopy.    Tanna Furry, MD 09/28/14 [phone]

## 2014-09-28 NOTE — ED Notes (Signed)
Called to give report, bed is not assigned per floor. Nurse to call back.

## 2014-09-28 NOTE — H&P (Signed)
[facility] History and Physical  Steven [name] LFY:[id number] DOB: 09/01/1928 DOA: 09/28/2014  Referring physician: Dr Jeneen Rinks - [facility] PCP: [name], [name]   Chief Complaint: Blood stool  HPI: Steven [name] is a 81 y.o. male  Bloody diarrhea: 1st episode on 3 days ago in the morning. Pt awoke to bloody stool in his bed. Single episode again 2 days ago. Non bloody diarrhea x1, 1 day ago.  no diarrhea today. Since onset patient has felt very weak and tired. Patient took Imodium after the initial episode and feels that this helped some. Patient has had decreased oral intake over this period time due to a general feeling of anorexia. Patient takes aspirin 81 mg but no other NSAIDs. Patient states he has had regular colonoscopies but is unsure of the results or timing of his last one. The problem is an acute onset and intermittent. Symptoms of fatigue and general weakness are constant and getting worse. Nothing makes symptoms better or worse.    Review of Systems:  Constitutional:  No weight loss, night sweats, Fevers, chills,  HEENT:  No headaches, Difficulty swallowing,Tooth/dental problems,Sore throat,  No sneezing, itching, ear ache, nasal congestion, post nasal drip,  Cardio-vascular:  No chest pain, Orthopnea, PND, swelling in lower extremities, anasarca, dizziness, palpitations  GI:  Per HPI  Resp:   No shortness of breath with exertion or at rest. No excess mucus, no productive cough, No non-productive cough, No coughing up of blood.No change in color of mucus.No wheezing.No chest wall deformity  Skin:  no rash or lesions.  GU:  no dysuria, change in color of urine, no urgency or frequency. No flank pain.  Musculoskeletal:   No joint pain or swelling. No decreased range of motion. No back pain.  Psych:  No change in mood or affect. No depression or anxiety. No memory loss.   Past Medical History  Diagnosis Date  . Hypertension   . Hyperlipidemia   . Anxiety     . Myocardial infarction 1999  . Cancer 2013    Lung - cancer free x3 years.    Past Surgical History  Procedure Laterality Date  . Cholecystectomy    . Coronary artery bypass graft  1999    5 grafts  . Hernia repair  8527    umbilical hernia repair  . Joint replacement  2000    right shoulder rotator cuff repair  . Carpal tunnel release  1980's    right and left  . Flexible bronchoscopy  11/07/2011    Procedure: FLEXIBLE BRONCHOSCOPY;  Surgeon: Gaye Pollack, MD;  Location: [facility] OR;  Service: Thoracic;  Laterality: N/A;   Social History:  reports that he quit smoking about 7 years ago. His smoking use included Cigarettes. He started smoking about 8 years ago. He has a 146 pack-year smoking history. He has never used smokeless tobacco. He reports that he does not drink alcohol or use illicit drugs.  No Known Allergies  Family History  Problem Relation Age of Onset  . Cancer Mother   . Pulmonary embolism Father      Prior to Admission medications   Medication Sig Start Date End Date Taking? Authorizing Provider  aspirin EC 81 MG tablet Take 81 mg by mouth daily.   Yes Historical Provider, MD  atorvastatin (LIPITOR) 10 MG tablet TAKE ONE TABLET BY MOUTH ONE TIME DAILY 09/13/14  Yes Mary-Margaret Hassell Done, [name]  clonazePAM (KLONOPIN) 1 MG tablet TAKE ONE TABLET BY MOUTH THREE TIMES DAILY  08/30/14  Yes Mary-Margaret Hassell Done, [name]  dextromethorphan-guaiFENesin [facility] DM) 30-600 MG per 12 hr tablet Take 1 tablet by mouth 2 (two) times daily.   Yes Historical Provider, MD  doxazosin (CARDURA) 2 MG tablet TAKE ONE TABLET BY MOUTH ONE TIME DAILY 09/13/14  Yes Mary-Margaret Hassell Done, [name]  Doxylamine Succinate, Sleep, (SLEEP AID PO) Take 1 tablet by mouth at bedtime.   Yes Historical Provider, MD  Fe Fum-FA-B Cmp-C-Zn-Mg-Mn-Cu (HEMOCYTE PLUS) 106-1 MG CAPS Take 1 tablet by mouth daily. Patient not taking: Reported on 09/28/2014 06/18/14   Mary-Margaret Hassell Done, [name]   Physical Exam: Filed Vitals:    09/28/14 1308 09/28/14 1539  BP: 133/66 126/57  Pulse: 87 79  Temp: 98.4 F (36.9 C)   TempSrc: Oral   Resp: 18 18  Height: 5' 7"  (1.702 m)   Weight: 63.504 kg (140 lb)   SpO2: 94% 96%    Wt Readings from Last 3 Encounters:  09/28/14 63.504 kg (140 lb)  06/18/14 64.864 kg (143 lb)  06/09/14 65.318 kg (144 lb)    General: Pale,  Appears calm and comfortable Eyes:  PERRL, normal lids, irises & conjunctiva ENT: dry mucus membranes Neck:  no LAD, masses or thyromegaly Cardiovascular: faint heart sounds, RRR, no m/r/g. No LE edema. Telemetry:  SR, no arrhythmias  Respiratory: intermittent wheezing, but otherwise clear. Normal respiratory effort. Abdomen:  soft, ntnd Skin:  no rash or induration seen on limited exam Musculoskeletal:  grossly normal tone BUE/BLE Psychiatric:  grossly normal mood and affect, speech fluent and appropriate Neurologic:  grossly non-focal.          Labs on Admission:  Basic Metabolic Panel:  Recent Labs Lab 09/28/14 1330  NA 139  K 3.9  CL 104  CO2 27  GLUCOSE 113*  BUN 28*  CREATININE 1.29*  CALCIUM 8.6*   Liver Function Tests:  Recent Labs Lab 09/28/14 1330  AST 158*  ALT 199*  ALKPHOS 129*  BILITOT 0.7  PROT 6.7  ALBUMIN 3.2*   No results for input(s): LIPASE, AMYLASE in the last 168 hours. No results for input(s): AMMONIA in the last 168 hours. CBC:  Recent Labs Lab 09/28/14 1330  WBC 5.3  NEUTROABS 3.2  HGB 10.9*  HCT 33.9*  MCV 96.6  PLT 136*   Cardiac Enzymes: No results for input(s): CKTOTAL, CKMB, CKMBINDEX, TROPONINI in the last 168 hours.  BNP (last 3 results) No results for input(s): BNP in the last 8760 hours.  ProBNP (last 3 results) No results for input(s): PROBNP in the last 8760 hours.  CBG: No results for input(s): GLUCAP in the last 168 hours.  Radiological Exams on Admission: No results found.    Assessment/Plan Principal Problem:   GI bleed Active Problems:   Anxiety state    Essential hypertension   COPD with emphysema   Squamous cell carcinoma of lung   Transaminitis   Nicotine addiction   HLD (hyperlipidemia)   CKD (chronic kidney disease), stage III   GI bleed: Hgb stable at 10.9 (baseline). Appears to be improving as patient's stool per report appears to be changing from grossly bloody to more normal, but patient with generalized weakness symptoms and on a daily aspirin without a PPI. Discussed case with on-call physician Dr. Oneida Alar who feels patient is appropriate for admission and likely scope in a.m.  - Tele - Clear liquid diet then NPO after midnight - Coags - Protonix - Follow-up GI recommendations, greatly appreciate Dr. Oneida Alar assistance in this patient's care.  Lung Cancer: SCC. Patient states that after resection and radiation therapy he has been cancer free. Typically goes to [facility] for follow-up care. - Will notify H/O team via Epic of patient being admitted  Generalized weakness: likely from GI bleed and poor nutrition over past several days - PT/OT - nutrition consult  CKD III: Cr 1.29. Baseline 1.4.  - IVF - BMET in am  Transaminitis: AST/ALT 158/199. Alk Phos 129. ETOH induced vs acute anemia vs other hepatic process. In lite of GI bleed there is concern for possible GI malignancy and hepatic involvement.  - CMET in am - further workup recommended if remains elevated.   NIcotine addiction: Patient quit smoking several years ago but continues to chew tobacco. - Nicotine patch  HLD: - continue Lipitor  ANxiety - continue klonopin  CAD h/o MI: s/p 5 vessel CABG in 1999.  - hold ASA until after scope  HTN: normotensive - continue Cardura - Hydralazine PRN  Insomnia: -  - continue Doxylamine  Code Status: FULL DVT Prophylaxis: SCD Family Communication: Son Disposition Plan: Pending workup  [name], Steven Lenna Sciara, MD Family Medicine [facility] [email or web address] Password [email or web address]

## 2014-09-29 ENCOUNTER — Encounter (HOSPITAL_COMMUNITY)
Admission: EM | Disposition: A | Discharge status: Home / Self Care | Payer: Self-pay | Source: Home / Self Care | Attending: Emergency Medicine

## 2014-09-29 ENCOUNTER — Encounter (HOSPITAL_COMMUNITY): Payer: Self-pay | Admitting: *Deleted

## 2014-09-29 DIAGNOSIS — N183 Chronic kidney disease, stage 3 (moderate): Secondary | ICD-10-CM

## 2014-09-29 DIAGNOSIS — K922 Gastrointestinal hemorrhage, unspecified: Secondary | ICD-10-CM | POA: Diagnosis not present

## 2014-09-29 DIAGNOSIS — K649 Unspecified hemorrhoids: Secondary | ICD-10-CM | POA: Diagnosis not present

## 2014-09-29 DIAGNOSIS — K625 Hemorrhage of anus and rectum: Secondary | ICD-10-CM

## 2014-09-29 DIAGNOSIS — I259 Chronic ischemic heart disease, unspecified: Secondary | ICD-10-CM | POA: Diagnosis not present

## 2014-09-29 DIAGNOSIS — R269 Unspecified abnormalities of gait and mobility: Secondary | ICD-10-CM | POA: Diagnosis not present

## 2014-09-29 DIAGNOSIS — I10 Essential (primary) hypertension: Secondary | ICD-10-CM | POA: Diagnosis not present

## 2014-09-29 DIAGNOSIS — D649 Anemia, unspecified: Secondary | ICD-10-CM | POA: Diagnosis not present

## 2014-09-29 DIAGNOSIS — S023XXA Fracture of orbital floor, initial encounter for closed fracture: Secondary | ICD-10-CM | POA: Diagnosis not present

## 2014-09-29 DIAGNOSIS — C3490 Malignant neoplasm of unspecified part of unspecified bronchus or lung: Secondary | ICD-10-CM | POA: Diagnosis not present

## 2014-09-29 DIAGNOSIS — K529 Noninfective gastroenteritis and colitis, unspecified: Secondary | ICD-10-CM | POA: Diagnosis not present

## 2014-09-29 DIAGNOSIS — I6200 Nontraumatic subdural hemorrhage, unspecified: Secondary | ICD-10-CM | POA: Diagnosis not present

## 2014-09-29 HISTORY — PX: COLONOSCOPY: SHX5424

## 2014-09-29 LAB — CBC WITH DIFFERENTIAL/PLATELET
Basophils Absolute: 0 10*3/uL (ref 0.0–0.1)
Basophils Relative: 1 % (ref 0–1)
Eosinophils Absolute: 0.2 10*3/uL (ref 0.0–0.7)
Eosinophils Relative: 4 % (ref 0–5)
HCT: 32.2 % — ABNORMAL LOW (ref 39.0–52.0)
Hemoglobin: 10.4 g/dL — ABNORMAL LOW (ref 13.0–17.0)
Lymphocytes Relative: 32 % (ref 12–46)
Lymphs Abs: 1.3 10*3/uL (ref 0.7–4.0)
MCH: 30.8 pg (ref 26.0–34.0)
MCHC: 32.3 g/dL (ref 30.0–36.0)
MCV: 95.3 fL (ref 78.0–100.0)
Monocytes Absolute: 0.8 10*3/uL (ref 0.1–1.0)
Monocytes Relative: 18 % — ABNORMAL HIGH (ref 3–12)
Neutro Abs: 1.9 10*3/uL (ref 1.7–7.7)
Neutrophils Relative %: 45 % (ref 43–77)
Platelets: 138 10*3/uL — ABNORMAL LOW (ref 150–400)
RBC: 3.38 MIL/uL — ABNORMAL LOW (ref 4.22–5.81)
RDW: 12.7 % (ref 11.5–15.5)
WBC: 4.2 10*3/uL (ref 4.0–10.5)

## 2014-09-29 LAB — CBC
HCT: 30.4 % — ABNORMAL LOW (ref 39.0–52.0)
Hemoglobin: 9.9 g/dL — ABNORMAL LOW (ref 13.0–17.0)
MCH: 31.3 pg (ref 26.0–34.0)
MCHC: 32.6 g/dL (ref 30.0–36.0)
MCV: 96.2 fL (ref 78.0–100.0)
Platelets: 130 10*3/uL — ABNORMAL LOW (ref 150–400)
RBC: 3.16 MIL/uL — ABNORMAL LOW (ref 4.22–5.81)
RDW: 13 % (ref 11.5–15.5)
WBC: 4.3 10*3/uL (ref 4.0–10.5)

## 2014-09-29 LAB — TYPE AND SCREEN
ABO/RH(D): A POS
Antibody Screen: NEGATIVE

## 2014-09-29 LAB — COMPREHENSIVE METABOLIC PANEL
ALT: 127 U/L — ABNORMAL HIGH (ref 17–63)
AST: 69 U/L — ABNORMAL HIGH (ref 15–41)
Albumin: 2.7 g/dL — ABNORMAL LOW (ref 3.5–5.0)
Alkaline Phosphatase: 99 U/L (ref 38–126)
Anion gap: 5 (ref 5–15)
BUN: 26 mg/dL — ABNORMAL HIGH (ref 6–20)
CO2: 28 mmol/L (ref 22–32)
Calcium: 8.3 mg/dL — ABNORMAL LOW (ref 8.9–10.3)
Chloride: 106 mmol/L (ref 101–111)
Creatinine, Ser: 1.09 mg/dL (ref 0.61–1.24)
GFR calc Af Amer: >60 mL/min (ref >60)
GFR calc non Af Amer: >60 mL/min (ref >60)
Glucose, Bld: 95 mg/dL (ref 65–99)
Potassium: 3.9 mmol/L (ref 3.5–5.1)
Sodium: 139 mmol/L (ref 135–145)
Total Bilirubin: 0.7 mg/dL (ref 0.3–1.2)
Total Protein: 5.8 g/dL — ABNORMAL LOW (ref 6.5–8.1)

## 2014-09-29 SURGERY — COLONOSCOPY
Anesthesia: Moderate Sedation

## 2014-09-29 MED ORDER — MEPERIDINE HCL 50 MG/ML IJ SOLN
INTRAMUSCULAR | Status: AC
  Filled 2014-09-29: qty 1

## 2014-09-29 MED ORDER — MIDAZOLAM HCL 5 MG/5ML IJ SOLN
INTRAMUSCULAR | Status: DC | PRN
  Administered 2014-09-29 (×4): 1 mg via INTRAVENOUS

## 2014-09-29 MED ORDER — MEPERIDINE HCL 50 MG/ML IJ SOLN
INTRAMUSCULAR | Status: DC | PRN
  Administered 2014-09-29 (×2): 20 mg via INTRAVENOUS
  Administered 2014-09-29: 10 mg via INTRAVENOUS

## 2014-09-29 MED ORDER — MIDAZOLAM HCL 5 MG/5ML IJ SOLN
INTRAMUSCULAR | Status: AC
  Filled 2014-09-29: qty 10

## 2014-09-29 MED ORDER — PEG 3350-KCL-NA BICARB-NACL 420 G PO SOLR
4000.0000 mL | Freq: Once | ORAL | Status: AC
  Administered 2014-09-29: 4000 mL via ORAL
  Filled 2014-09-29: qty 4000

## 2014-09-29 MED ORDER — STERILE WATER FOR IRRIGATION IR SOLN
Status: DC | PRN
  Administered 2014-09-29: 17:00:00

## 2014-09-29 NOTE — Care Management Note (Signed)
Case Management Note  Patient Details  Name: Steven [name] MRN: [id number] Date of Birth: [birth year]  Expected Discharge Date:  10/01/14               Expected Discharge Plan:  [place]  In-House Referral:  NA  Discharge planning Services  CM Consult  Post Acute Care Choice:  Durable Medical Equipment, Home Health Choice offered to:  Patient  DME Arranged:  Kasandra Knudsen DME Agency:  [facility] Arranged:  PT [facility] Agency:  [place]  Status of Service:  In process, will continue to follow  Medicare Important Message Given:    Date Medicare IM Given:    Medicare IM give by:    Date Additional Medicare IM Given:    Additional Medicare Important Message give by:     If discussed at [place] of Stay Meetings, dates discussed:    Additional Comments: Pt is from home, lives alone and is independent at baseline. Pt admitted for GI bleed. Pt having TCS today. PT has recommended HH PT at DC. Pt would like [facility] as he has used them in the past. Pt also has no DME's at home and will need a cane. Ronalee Belts, from [facility], has been notified of DME and [facility] referral. Kasandra Knudsen will be delivered to pt's room prior to DC and [facility] will be updated of when pt is discharge. CM will cont to follow for DC planning.  Sherald Barge, RN 09/29/2014, 1:57 PM

## 2014-09-29 NOTE — Consult Note (Addendum)
Reason for Consult:GI bleed Referring Physician: Hospitalist  Steven [name] is an 81 y.o. male.  HPI: Admitted thru the ED yesterday. States he began to have diarrhea Monday which he says was all blood. He describes as a lot of blood. BRRB.  Rectal bleeding x 2 days. He says he has been weak for a long time. He denies any fever. He has not been on any antibiotics recently. Up until Monday his stool were normal. He had a normal BM this am and he reports it was normal.  He has he has a hx of constipation and strains to have a BM. He drinks prune juice as needed for constipation. He says he usually has a BM daily and strains to have a BM. Appetite has been okay. There has been no weight loss.  Takes ASA  3m daily. Has been off Iron for about 3 weeks due to constipation.  CABG in 1999.  [place]  Hx of lung cancer in 2013 with right Video Assisted Thoracoscopy , Right Mini Thoaracotomy,Right Lower Lobe Wedge Resection by Dr. BCyndia Bent Hemoglobin appears stable.       Past Medical History  Diagnosis Date  . Hypertension   . Hyperlipidemia   . Anxiety   . Myocardial infarction 1999  . Cancer 2013    Lung - cancer free x3 years.     Past Surgical History  Procedure Laterality Date  . Cholecystectomy    . Coronary artery bypass graft  1999    5 grafts  . Hernia repair  24888   umbilical hernia repair  . Joint replacement  2000    right shoulder rotator cuff repair  . Carpal tunnel release  1980's    right and left  . Flexible bronchoscopy  11/07/2011    Procedure: FLEXIBLE BRONCHOSCOPY;  Surgeon: BGaye Pollack MD;  [facility];  Service: Thoracic;  Laterality: N/A;    Family History  Problem Relation Age of Onset  . Cancer Mother   . Pulmonary embolism Father     Social History:  reports that he quit smoking about 7 years ago. His smoking use included Cigarettes. He started smoking about 8 years ago.  He has a 146 pack-year smoking history. He has never used smokeless tobacco. He reports that he does not drink alcohol or use illicit drugs.  Allergies: No Known Allergies  Medications: I have reviewed the patient's current medications.  Results for orders placed or performed during the hospital encounter of 09/28/14 (from the past 48 hour(s))  CBC with Differential/Platelet     Status: Abnormal   Collection Time: 09/28/14  1:30 PM  Result Value Ref Range   WBC 5.3 4.0 - 10.5 K/uL   RBC 3.51 (L) 4.22 - 5.81 MIL/uL   Hemoglobin 10.9 (L) 13.0 - 17.0 g/dL   HCT 33.9 (L) 39.0 - 52.0 %   MCV 96.6 78.0 - 100.0 fL   MCH 31.1 26.0 - 34.0 pg   MCHC 32.2 30.0 - 36.0 g/dL   RDW 13.2 11.5 - 15.5 %   Platelets 136 (L) 150 - 400 K/uL   Neutrophils Relative % 59 43 - 77 %   Neutro Abs 3.2 1.7 - 7.7 K/uL   Lymphocytes Relative 24 12 - 46 %   Lymphs Abs 1.3 0.7 - 4.0 K/uL   Monocytes Relative 16 (H) 3 - 12 %   Monocytes Absolute 0.8 0.1 -  1.0 K/uL   Eosinophils Relative 1 0 - 5 %   Eosinophils Absolute 0.1 0.0 - 0.7 K/uL   Basophils Relative 1 0 - 1 %   Basophils Absolute 0.0 0.0 - 0.1 K/uL   WBC Morphology ATYPICAL LYMPHOCYTES   Comprehensive metabolic panel     Status: Abnormal   Collection Time: 09/28/14  1:30 PM  Result Value Ref Range   Sodium 139 135 - 145 mmol/L   Potassium 3.9 3.5 - 5.1 mmol/L   Chloride 104 101 - 111 mmol/L   CO2 27 22 - 32 mmol/L   Glucose, Bld 113 (H) 65 - 99 mg/dL   BUN 28 (H) 6 - 20 mg/dL   Creatinine, Ser 1.29 (H) 0.61 - 1.24 mg/dL   Calcium 8.6 (L) 8.9 - 10.3 mg/dL   Total Protein 6.7 6.5 - 8.1 g/dL   Albumin 3.2 (L) 3.5 - 5.0 g/dL   AST 158 (H) 15 - 41 U/L   ALT 199 (H) 17 - 63 U/L   Alkaline Phosphatase 129 (H) 38 - 126 U/L   Total Bilirubin 0.7 0.3 - 1.2 mg/dL   GFR calc non Af Amer 49 (L) >60 mL/min   GFR calc Af Amer 57 (L) >60 mL/min    Comment: (NOTE) The eGFR has been calculated using the CKD EPI equation. This calculation has not been  validated in all clinical situations. eGFR's persistently <60 mL/min signify possible Chronic Kidney Disease.    Anion gap 8 5 - 15  Protime-INR     Status: None   Collection Time: 09/28/14  1:30 PM  Result Value Ref Range   Prothrombin Time 14.8 11.6 - 15.2 seconds   INR 1.14 0.00 - 1.49  Type and screen     Status: None   Collection Time: 09/28/14  1:37 PM  Result Value Ref Range   ABO/RH(D) A POS    Antibody Screen NEG    Sample Expiration 10/01/2014   CBC     Status: Abnormal   Collection Time: 09/29/14  7:00 AM  Result Value Ref Range   WBC 4.3 4.0 - 10.5 K/uL   RBC 3.16 (L) 4.22 - 5.81 MIL/uL   Hemoglobin 9.9 (L) 13.0 - 17.0 g/dL   HCT 30.4 (L) 39.0 - 52.0 %   MCV 96.2 78.0 - 100.0 fL   MCH 31.3 26.0 - 34.0 pg   MCHC 32.6 30.0 - 36.0 g/dL   RDW 13.0 11.5 - 15.5 %   Platelets 130 (L) 150 - 400 K/uL  Comprehensive metabolic panel     Status: Abnormal   Collection Time: 09/29/14  7:00 AM  Result Value Ref Range   Sodium 139 135 - 145 mmol/L   Potassium 3.9 3.5 - 5.1 mmol/L   Chloride 106 101 - 111 mmol/L   CO2 28 22 - 32 mmol/L   Glucose, Bld 95 65 - 99 mg/dL   BUN 26 (H) 6 - 20 mg/dL   Creatinine, Ser 1.09 0.61 - 1.24 mg/dL   Calcium 8.3 (L) 8.9 - 10.3 mg/dL   Total Protein 5.8 (L) 6.5 - 8.1 g/dL   Albumin 2.7 (L) 3.5 - 5.0 g/dL   AST 69 (H) 15 - 41 U/L   ALT 127 (H) 17 - 63 U/L   Alkaline Phosphatase 99 38 - 126 U/L   Total Bilirubin 0.7 0.3 - 1.2 mg/dL   GFR calc non Af Amer >60 >60 mL/min   GFR calc Af Amer >60 >60 mL/min  Comment: (NOTE) The eGFR has been calculated using the CKD EPI equation. This calculation has not been validated in all clinical situations. eGFR's persistently <60 mL/min signify possible Chronic Kidney Disease.    Anion gap 5 5 - 15    No results found.  ROS Blood pressure 122/48, pulse 78, temperature 97.9 F (36.6 C), temperature source Oral, resp. rate 17, height _0  (1.702 m), weight 139 lb 9.6 oz (63.322 kg), SpO2 96  %. Physical Exam Alert and oriented. Skin warm and dry. Oral mucosa is moist.   . Sclera anicteric, conjunctivae is pink. Thyroid not enlarged. No cervical lymphadenopathy. Lungs clear. Heart regular rate and rhythm.  Abdomen is soft. Bowel sounds are positive. No hepatomegaly. No abdominal masses felt. Tenderness rt lower abdomen and umblicus.  No edema to lower extremities.    Assessment/Plan: Probable lower GI. ? Etiology. Diverticular bleed, colonic neoplasm, hemorrhoid needs to be ruled out.  I will discuss with Dr. Laural Golden. Patient is agreeable to a colonoscopy.   SETZER,TERRI W 09/29/2014, 7:45 AM     GI attending note: Patient interviewed and examined. Painless large-volume hematochezia with anemia. Patient is agreeable to proceed with diagnostic colonoscopy. Mildly elevated transaminases may be related to atorvastatin. Patient will need abdominal ultrasound to rule out other etiologies.

## 2014-09-29 NOTE — Op Note (Signed)
COLONOSCOPY PROCEDURE REPORT  PATIENT:  Steven [name]  MR#:  [id number] Birthdate:  [birth year], 81 y.o., male Endoscopist:  Dr. Rogene Houston, MD Referred By:  Dr. Lannie Fields, MD  Procedure Date: 09/29/2014  Procedure:   Colonoscopy  Indications:  Patient is 81 year old Caucasian male who presents with few day history of rectal bleeding and his hemoglobin is low. According to his son his illness started with diarrhea over the weekend. There is no history of recent antibodies use.  Informed Consent:  The procedure and risks were reviewed with the patient and informed consent was obtained.  Medications:  Demerol 50 mg IV Versed 5 mg IV  Description of procedure:  After a digital rectal exam was performed, that colonoscope was advanced from the anus through the rectum and colon to the area of the cecum, ileocecal valve and appendiceal orifice. The cecum was deeply intubated. These structures were well-seen and photographed for the record. From the level of the cecum and ileocecal valve, the scope was slowly and cautiously withdrawn. The mucosal surfaces were carefully surveyed utilizing scope tip to flexion to facilitate fold flattening as needed. The scope was pulled down into the rectum where a thorough exam including retroflexion was performed. Terminal ileum was also examined.  Findings:   Prep satisfactory. Mucosa at terminal ileum was normal. Multiple ulcers noted at cecum and ileocecal valve along with few more erosions at ascending colon. There was patchy erythema edema and loss of vascularity all the way down to rectum. Small hemorrhoids noted below the dentate line.   Therapeutic/Diagnostic Maneuvers Performed:   Stool sample sent to the lab for GI pathogen panel. Random biopsies taken from mucosa of cecum and ascending colon. Random biopsies taken from rectosigmoid mucosa.  Complications:  None   Cecal Withdrawal Time:  14  minutes  Impression:  Normal mucosa  of terminal ileum. Pancolitis with patchy involvement with most pronounced changes at cecum and ascending colon and and mild colitis involving the rectosigmoid area. Stool sample taken for GI pathogen panel along with biopsies from proximal and distal.  Comment: Acute colitis is most likely secondary to an infection. Doubt IBD.   Recommendations:  Full liquid diet. Upper abdominal ultrasound. CBC with differential and LFTs in a.m. Await results of stool studies sent biopsies.  REHMAN,NAJEEB U  09/29/2014 5:47 PM  CC: Dr. [name], [name] & Dr. Rayne Du ref. provider found

## 2014-09-29 NOTE — Evaluation (Signed)
Occupational Therapy Evaluation Patient Details Name: Steven [name] MRN: [id number] DOB: November 03, 1928 Today's Date: 09/29/2014    History of Present Illness Pt is an 81 y/o male presenting with bloody diarrhea: 1st episode on 3 days ago in the morning. Pt awoke to bloody stool in his bed. Single episode again 2 days ago. Non bloody diarrhea x1, 1 day ago. no diarrhea today. Since onset patient has felt very weak and tired. Patient took Imodium after the initial episode and feels that this helped some. Patient has had decreased oral intake over this period time due to a general feeling of anorexia. Patient takes aspirin 81 mg but no other NSAIDs. Patient states he has had regular colonoscopies but is unsure of the results or timing of his last one. The problem is an acute onset and intermittent. Symptoms of fatigue and general weakness are constant and getting worse. Nothing makes symptoms better or worse.   Clinical Impression   PTA pt living at home, alone, independent in B/IADL tasks. Pt reports feeling "weak in general" when symptoms began. Pt demonstrates independence in bed mobility and LB dressing tasks this session. Pt reports walking to and from the bathroom during the night and this morning with no assistance. Pt demonstrates BUE range of motion WNL and strength 4+/5. Pt appears to be at baseline with ADL completion. No further OT services required at this time.     Follow Up Recommendations  No OT follow up;Supervision - Intermittent          Precautions / Restrictions Restrictions Weight Bearing Restrictions: No      Mobility Bed Mobility Overal bed mobility: Independent                        ADL Overall ADL's : Modified independent;At baseline Eating/Feeding: Modified independent                   Lower Body Dressing: Modified independent;Sitting/lateral leans                       Vision Vision Assessment?: No apparent visual deficits           Pertinent Vitals/Pain Pain Assessment: No/denies pain     Hand Dominance Right   Extremity/Trunk Assessment Upper Extremity Assessment Upper Extremity Assessment: Overall WFL for tasks assessed   Lower Extremity Assessment Lower Extremity Assessment: Defer to PT evaluation       Communication Communication Communication: No difficulties   Cognition Arousal/Alertness: Awake/alert Behavior During Therapy: WFL for tasks assessed/performed Overall Cognitive Status: Within Functional Limits for tasks assessed                                Home Living Family/patient expects to be discharged to:: Private residence Living Arrangements: Alone Available Help at Discharge: Family;Available PRN/intermittently (pt is uncertain of family availability ) Type of Home: Apartment             Bathroom Shower/Tub: Teacher, early years/pre: Standard     Home Equipment: Grab bars - toilet;Grab bars - tub/shower          Prior Functioning/Environment Level of Independence: Independent              End of Session    Activity Tolerance: Patient tolerated treatment well Patient left: in bed;with call bell/phone within reach   Time: 8546-2703 OT Time Calculation (  min): 24 min Charges:  OT General Charges $OT Visit: 1 Procedure OT Evaluation $Initial OT Evaluation Tier I: 1 Procedure G-Codes: OT G-codes **NOT FOR INPATIENT CLASS** Functional Assessment Tool Used: Clinical judgement Functional Limitation: Self care Self Care Current Status (M1848): At least 1 percent but less than 20 percent impaired, limited or restricted Self Care Goal Status (T9276): At least 1 percent but less than 20 percent impaired, limited or restricted Self Care Discharge Status [phone] At least 1 percent but less than 20 percent impaired, limited or restricted   Guadelupe Sabin, OTR/L  [phone]  09/29/2014, 9:25 AM

## 2014-09-29 NOTE — Evaluation (Signed)
Physical Therapy Evaluation Patient Details Name: Steven [name] MRN: [id number] DOB: 07-Feb-1928 Today's Date: 09/29/2014   History of Present Illness  Pt is an 81 y/o male presenting with bloody diarrhea: 1st episode on 3 days ago in the morning. Pt awoke to bloody stool in his bed. Single episode again 2 days ago. Non bloody diarrhea x1, 1 day ago. no diarrhea today. Since onset patient has felt very weak and tired. Patient took Imodium after the initial episode and feels that this helped some. Patient has had decreased oral intake over this period time due to a general feeling of anorexia. Patient takes aspirin 81 mg but no other NSAIDs. Patient states he has had regular colonoscopies but is unsure of the results or timing of his last one. The problem is an acute onset and intermittent. Symptoms of fatigue and general weakness are constant and getting worse. Nothing makes symptoms better or worse.  Clinical Impression  Pt is reportedly at baseline upon evaluation, but presenting with impaired balance, as he does not withstand perturbations without LOB c eventual poor self recovery. Pt balance is normal with rhomberg testing and forward reach, and pt reports no falls in the past. Pt is also at high falls risk when gait speed is assessed. Pt is at baseline warranting no additional PT needs in this setting, however presents with impaired strength, balance, and activity tolerance and will benefit from skilled intervention s/p DC to reduce falls risk and to maintain indep in community dwelling, IADL, and ADL.     Follow Up Recommendations Home health PT    Equipment Recommendations  None recommended by PT    Recommendations for Other Services       Precautions / Restrictions Precautions Precautions: None Precaution Comments: JH score of 8, RN askes for bed alarmed.  Restrictions Weight Bearing Restrictions: No      Mobility  Bed Mobility Overal bed mobility: Independent              General bed mobility comments: Received sitting up   Transfers Overall transfer level: Needs assistance Equipment used: None Transfers: Sit to/from Stand Sit to Stand: Supervision            Ambulation/Gait Ambulation/Gait assistance: Supervision Ambulation Distance (Feet): 120 Feet Assistive device: None Gait Pattern/deviations: Decreased step length - right;Decreased step length - left;Decreased weight shift to right;Drifts right/left Gait velocity: 1.52f/sec  Gait velocity interpretation: <1.8 ft/sec, indicative of risk for recurrent falls    Stairs            Wheelchair Mobility    Modified Rankin (Stroke Patients Only)       Balance Overall balance assessment: Modified Independent Sitting-balance support: Feet supported;No upper extremity supported Sitting balance-Leahy Scale: Good   Postural control: Left lateral lean Standing balance support: No upper extremity supported Standing balance-Leahy Scale: Fair                 High Level Balance Comments: Forward reach is >10 inches; LOB with mild perturbations in sagittal and frontal planes.              Pertinent Vitals/Pain Pain Assessment: No/denies pain    Home Living Family/patient expects to be discharged to:: Private residence Living Arrangements: Alone Available Help at Discharge: Family;Available PRN/intermittently Type of Home: Apartment Home Access: Level entry     Home Layout: One level Home Equipment: Grab bars - toilet;Grab bars - tub/shower      Prior Function Level of Independence: Independent  Comments: Limmited community ambulation, taking breaks due to tired painful legs.      Hand Dominance   Dominant Hand: Right    Extremity/Trunk Assessment   Upper Extremity Assessment: Defer to OT evaluation           Lower Extremity Assessment: Generalized weakness (Limited propulsion during gait, but is at/near baseline. )      Cervical / Trunk  Assessment: Normal  Communication   Communication: No difficulties  Cognition Arousal/Alertness: Awake/alert Behavior During Therapy: WFL for tasks assessed/performed Overall Cognitive Status: Within Functional Limits for tasks assessed                      General Comments      Exercises        Assessment/Plan    PT Assessment All further PT needs can be met in the next venue of care  PT Diagnosis Abnormality of gait;Difficulty walking   PT Problem List Decreased strength;Decreased activity tolerance;Decreased balance  PT Treatment Interventions     PT Goals (Current goals can be found in the Care Plan section) Acute Rehab PT Goals Patient Stated Goal: Pt would like to improve balance PT Goal Formulation: With patient Time For Goal Achievement: 10/13/14 Potential to Achieve Goals: Good    Frequency     Barriers to discharge        Co-evaluation               End of Session Equipment Utilized During Treatment: Gait belt Activity Tolerance: Patient tolerated treatment well Patient left: in bed;with bed alarm set;with call bell/phone within reach Nurse Communication: Other (comment)    Functional Limitation: Mobility: Walking and moving around Mobility: Walking and Moving Around Current Status (V3612): At least 1 percent but less than 20 percent impaired, limited or restricted Mobility: Walking and Moving Around Goal Status [phone]): 0 percent impaired, limited or restricted    Time: 0946-1010 PT Time Calculation (min) (ACUTE ONLY): 24 min   Charges:   PT Evaluation $Initial PT Evaluation Tier I: 1 Procedure     PT G Codes:   PT G-Codes **NOT FOR INPATIENT CLASS** Functional Limitation: Mobility: Walking and moving around Mobility: Walking and Moving Around Current Status (N3005): At least 1 percent but less than 20 percent impaired, limited or restricted Mobility: Walking and Moving Around Goal Status [phone]): 0 percent impaired, limited or  restricted    Shauni Henner C 09/29/2014, 10:42 AM  10:45 AM  Etta Grandchild, PT, DPT [place] License # [id number] [phone]

## 2014-09-29 NOTE — Progress Notes (Signed)
[facility] PROGRESS NOTE  Steven [name] ZCH:[id number] DOB: [birth year] DOA: 09/28/2014 PCP: [name], [name]  Assessment/Plan: GI Bleed -Has had no further episodes since admission and has had several BMs as he his taking his colonoscopy prep. -Etiology unclear: diverticular vs possibly malignancy.  -For colonoscopy today. -Continue protonix. -ASA on hold.  Acute on Chronic Blood Loss Anemia -Secondary to above.  -No transfusion unless Hb <7.0.  SCC of Lung -S/p resection and radiation therapy. -F/u with OP providers as scheduled.  Generalized Weakness -Likely related to GI Bleed. -PT/OT consultations.  CKD Stage III -Cr is better than baseline of around 1.2-1.4.  Transaminitis -Improved overnight. -?relationship to ETOH abuse.  Tobacco Abuse -Counseled on cessation. Now only chews tobacco.  HTN -Well controlled. -Continue Cardura.  CAD -Stable, no chest pain -H/0 5 vessel CABG in 1999. -ASA on hold due to GI bleed.  H/o Insomnia -Continue doxylamine  Code Status: Full Code Family Communication: Patient only  Disposition Plan: Pending colonoscopy results   Consultants:  GI   Antibiotics:  None   Subjective: No further melena or BRBPR. Says he cannot tolerate any more of the golytely.  Objective: Filed Vitals:   09/28/14 1800 09/28/14 1831 09/28/14 2109 09/29/14 0508  BP: 156/56 154/50 132/60 122/48  Pulse:  95 81 78  Temp:  98.9 F (37.2 C) 101 F (38.3 C) 97.9 F (36.6 C)  TempSrc:  Oral Oral Oral  Resp: '24 20 18 17  '$ Height:  '5\' 7"'$  (1.702 m)    Weight:  63.322 kg (139 lb 9.6 oz)    SpO2:  95% 94% 96%    Intake/Output Summary (Last 24 hours) at 09/29/14 1039 Last data filed at 09/29/14 0100  Gross per 24 hour  Intake      0 ml  Output      3 ml  Net     -3 ml   Filed Weights   09/28/14 1308 09/28/14 1831  Weight: 63.504 kg (140 lb) 63.322 kg (139 lb 9.6 oz)    Exam:   General:  AA  Ox3  Cardiovascular: RRR  Respiratory: CTA B  Abdomen: S/NT/ND/+BS  Extremities: no C/C/E   Neurologic:  Grossly intact and non-focal  Data Reviewed: Basic Metabolic Panel:  Recent Labs Lab 09/28/14 1330 09/29/14 0700  NA 139 139  K 3.9 3.9  CL 104 106  CO2 27 28  GLUCOSE 113* 95  BUN 28* 26*  CREATININE 1.29* 1.09  CALCIUM 8.6* 8.3*   Liver Function Tests:  Recent Labs Lab 09/28/14 1330 09/29/14 0700  AST 158* 69*  ALT 199* 127*  ALKPHOS 129* 99  BILITOT 0.7 0.7  PROT 6.7 5.8*  ALBUMIN 3.2* 2.7*   No results for input(s): LIPASE, AMYLASE in the last 168 hours. No results for input(s): AMMONIA in the last 168 hours. CBC:  Recent Labs Lab 09/28/14 1330 09/29/14 0700  WBC 5.3 4.3  NEUTROABS 3.2  --   HGB 10.9* 9.9*  HCT 33.9* 30.4*  MCV 96.6 96.2  PLT 136* 130*   Cardiac Enzymes: No results for input(s): CKTOTAL, CKMB, CKMBINDEX, TROPONINI in the last 168 hours. BNP (last 3 results) No results for input(s): BNP in the last 8760 hours.  ProBNP (last 3 results) No results for input(s): PROBNP in the last 8760 hours.  CBG: No results for input(s): GLUCAP in the last 168 hours.  No results found for this or any previous visit (from the past 240 hour(s)).   Studies: No  results found.  Scheduled Meds: . atorvastatin  10 mg Oral q1800  . clonazePAM  1 mg Oral TID  . doxazosin  2 mg Oral Daily  . nicotine  14 mg Transdermal Daily  . pantoprazole (PROTONIX) IV  40 mg Intravenous Q12H  . sodium chloride  3 mL Intravenous Q12H   Continuous Infusions: . sodium chloride 75 mL/hr at 09/29/14 5188    Principal Problem:   GI bleed Active Problems:   Anxiety state   Essential hypertension   COPD with emphysema   Squamous cell carcinoma of lung   Transaminitis   Nicotine addiction   HLD (hyperlipidemia)   CKD (chronic kidney disease), stage III   Lower GI bleed    Time spent: 30 minutes. Greater than 50% of this time was spent in direct  contact with the patient coordinating care.    Lelon Frohlich  [facility] Pager [phone]  If 7PM-7AM, please contact night-coverage at [email or web address], password [facility] 09/29/2014, 10:39 AM

## 2014-09-30 ENCOUNTER — Observation Stay (HOSPITAL_COMMUNITY): Payer: Commercial Managed Care - HMO

## 2014-09-30 ENCOUNTER — Encounter (HOSPITAL_COMMUNITY): Payer: Self-pay | Admitting: Internal Medicine

## 2014-09-30 DIAGNOSIS — K625 Hemorrhage of anus and rectum: Secondary | ICD-10-CM | POA: Diagnosis not present

## 2014-09-30 DIAGNOSIS — Z9889 Other specified postprocedural states: Secondary | ICD-10-CM | POA: Diagnosis not present

## 2014-09-30 DIAGNOSIS — N183 Chronic kidney disease, stage 3 (moderate): Secondary | ICD-10-CM | POA: Diagnosis not present

## 2014-09-30 DIAGNOSIS — I10 Essential (primary) hypertension: Secondary | ICD-10-CM | POA: Diagnosis not present

## 2014-09-30 DIAGNOSIS — R7989 Other specified abnormal findings of blood chemistry: Secondary | ICD-10-CM | POA: Diagnosis not present

## 2014-09-30 DIAGNOSIS — K922 Gastrointestinal hemorrhage, unspecified: Secondary | ICD-10-CM | POA: Diagnosis not present

## 2014-09-30 DIAGNOSIS — K529 Noninfective gastroenteritis and colitis, unspecified: Secondary | ICD-10-CM | POA: Diagnosis not present

## 2014-09-30 LAB — BASIC METABOLIC PANEL
Anion gap: 5 (ref 5–15)
BUN: 13 mg/dL (ref 6–20)
CO2: 29 mmol/L (ref 22–32)
Calcium: 8.6 mg/dL — ABNORMAL LOW (ref 8.9–10.3)
Chloride: 105 mmol/L (ref 101–111)
Creatinine, Ser: 0.97 mg/dL (ref 0.61–1.24)
GFR calc Af Amer: >60 mL/min (ref >60)
GFR calc non Af Amer: >60 mL/min (ref >60)
Glucose, Bld: 118 mg/dL — ABNORMAL HIGH (ref 65–99)
Potassium: 3.7 mmol/L (ref 3.5–5.1)
Sodium: 139 mmol/L (ref 135–145)

## 2014-09-30 LAB — HEPATIC FUNCTION PANEL
ALT: 89 U/L — ABNORMAL HIGH (ref 17–63)
AST: 39 U/L (ref 15–41)
Albumin: 2.8 g/dL — ABNORMAL LOW (ref 3.5–5.0)
Alkaline Phosphatase: 103 U/L (ref 38–126)
Bilirubin, Direct: 0.1 mg/dL (ref 0.1–0.5)
Indirect Bilirubin: 0.2 mg/dL — ABNORMAL LOW (ref 0.3–0.9)
Total Bilirubin: 0.3 mg/dL (ref 0.3–1.2)
Total Protein: 5.9 g/dL — ABNORMAL LOW (ref 6.5–8.1)

## 2014-09-30 LAB — CBC
HCT: 31 % — ABNORMAL LOW (ref 39.0–52.0)
Hemoglobin: 10.1 g/dL — ABNORMAL LOW (ref 13.0–17.0)
MCH: 31 pg (ref 26.0–34.0)
MCHC: 32.6 g/dL (ref 30.0–36.0)
MCV: 95.1 fL (ref 78.0–100.0)
Platelets: 150 10*3/uL (ref 150–400)
RBC: 3.26 MIL/uL — ABNORMAL LOW (ref 4.22–5.81)
RDW: 12.6 % (ref 11.5–15.5)
WBC: 4.2 10*3/uL (ref 4.0–10.5)

## 2014-09-30 MED ORDER — ENSURE ENLIVE PO LIQD
237.0000 mL | Freq: Two times a day (BID) | ORAL | Status: DC
  Administered 2014-09-30: 237 mL via ORAL

## 2014-09-30 NOTE — Care Management Note (Signed)
Case Management Note  Patient Details  Name: Steven [name] MRN: [id number] Date of Birth: [birth year]  Expected Discharge Date:  10/01/14               Expected Discharge Plan:  [place]  In-House Referral:  NA  Discharge planning Services  CM Consult  Post Acute Care Choice:  Durable Medical Equipment, Home Health Choice offered to:  Patient  DME Arranged:  Kasandra Knudsen DME Agency:  [place] Arranged:  PT [facility] Agency:  [place]  Status of Service:  Completed, signed off  Medicare Important Message Given:    Date Medicare IM Given:    Medicare IM give by:    Date Additional Medicare IM Given:    Additional Medicare Important Message give by:     If discussed at [place] of Stay Meetings, dates discussed:    Additional Comments: Pt discharging home today with self care. [facility] has been notified of DC today and patient is aware HH has 48 hours to make their first visit. Kasandra Knudsen has been delivered to pt's room. No further CM needs at this time.  Sherald Barge, RN 09/30/2014, 10:18 AM

## 2014-09-30 NOTE — Progress Notes (Signed)
Pt's IV removed and intact.  Pt's IV site clean dry and intact. Discharge instructions, medications and follow up appointments reviewed and discussed with patient. All questions were answered and no further questions at this time. Pt in stable condition and in no acute distress at this time. Pt escorted by nurse tech.

## 2014-09-30 NOTE — Discharge Instructions (Signed)

## 2014-09-30 NOTE — Progress Notes (Signed)
Initial Nutrition Assessment  DOCUMENTATION CODES:  n/a    INTERVENTION:  Ensure Enlive po BID, each supplement provides 350 kcal and 20 grams of protein    NUTRITION DIAGNOSIS:   Inadequate oral intake related to acute illness (colitis) as evidenced by per patient/family report and GI evaluation.   GOAL:   Patient will meet greater than or equal to 90% of their needs   MONITOR:   Diet advancement, PO intake  REASON FOR ASSESSMENT:   Malnutrition Screening Tool, Consult Assessment of nutrition requirement/status  ASSESSMENT:  Pt presents with rectal bleed. Decreased po intake for 2 days prior to admission per pt. His home diet is regular. Lives alone and prepares his own food. His usual intake includes: Breakfast:-2 slices toast with butter and honey, coffee at home the he goes to [employer] to drink coffee with friends Lunch:egg or chicken salad sandwich, chips and cookie with caffeine free beverage Afternoon snack: cornflakes with milk Dinner: chicken and dumplings and caffeine free drink Evening snack: Ritz crackers with peanut butter. Pt says he purchases mostly foods that he can prepare in the microwave. Based on pt diet hx- his appetite is usually good and meal pattern is optimal with 3 meals and 2 snacks daily. He is likely not consuming sufficient protein and we talked about ways for him to improve by adding eggs or greek yogurt at breakfast.  Pt says he also exercises regularly-walks and rides stationary bike.   Physical exam: mild wasting to clavicle and acromion region.  Labs: H/H 10.4/32.2 and BUN-26   Diet Order:  Diet full liquid Room service appropriate?: Yes; Fluid consistency:: Thin  Skin:   WDL  Last BM:   8/24 (Golytely prep)  Height:   Ht Readings from Last 1 Encounters:  09/28/14 '5\' 7"'$  (1.702 m)    Weight:   Wt Readings from Last 1 Encounters:  09/28/14 139 lb 9.6 oz (63.322 kg)    Ideal Body Weight:  67.2 kg  BMI:  Body mass  index is 21.86 kg/(m^2).  Estimated Nutritional Needs:   Kcal:  8299-3716  Protein:  76-85 gr  Fluid:  1.6-1.9 liters daily  EDUCATION NEEDS:   Education needs addressed encourage increasing sources of protein at each meal   Colman Cater MS,RD,CSG,LDN Office: [phone] Pager: [phone]

## 2014-09-30 NOTE — Care Management Note (Signed)
Case Management Note  Patient Details  Name: ALLON COSTLOW MRN: [id number] Date of Birth: 07-11-1928  Expected Discharge Date:  10/01/14               Expected Discharge Plan:  [place]  In-House Referral:  NA  Discharge planning Services  CM Consult  Post Acute Care Choice:  Durable Medical Equipment, Home Health Choice offered to:  Patient  DME Arranged:  Kasandra Knudsen DME Agency:  [place] Arranged:  PT [facility] Agency:  [place]  Status of Service:  Completed, signed off  Medicare Important Message Given:    Date Medicare IM Given:    Medicare IM give by:    Date Additional Medicare IM Given:    Additional Medicare Important Message give by:     If discussed at [place] of Stay Meetings, dates discussed:    Additional Comments: Clarification: patient has refused cane from [facility] due to $20 co-pay. Pt says he will borrow one from a neighbor. Pt says he is able to pay the $20 but doesn't feel its necessary if he can borrow one, he doesn't "plan on using it long".  Sherald Barge, RN 09/30/2014, 11:25 AM

## 2014-09-30 NOTE — Discharge Summary (Signed)
Physician Discharge Summary  Steven [name] ALP:[id number] DOB: [birth year] DOA: 09/28/2014  PCP: [name], [name]  Admit date: 09/28/2014 Discharge date: 09/30/2014  Time spent: 45 minutes  Recommendations for Outpatient Follow-up:  -Will be discharged home today. -Advised to follow up with his PCP in 2 weeks.   Discharge Diagnoses:  Principal Problem:   GI bleed Active Problems:   Anxiety state   Essential hypertension   COPD with emphysema   Squamous cell carcinoma of lung   Transaminitis   Nicotine addiction   HLD (hyperlipidemia)   CKD (chronic kidney disease), stage III   Lower GI bleed   Discharge Condition: Stable and improved  Filed Weights   09/28/14 1308 09/28/14 1831  Weight: 63.504 kg (140 lb) 63.322 kg (139 lb 9.6 oz)    History of present illness:  : Steven [name] is a 81 y.o. male  Bloody diarrhea: 1st episode on 3 days ago in the morning. Pt awoke to bloody stool in his bed. Single episode again 2 days ago. Non bloody diarrhea x1, 1 day ago. no diarrhea today. Since onset patient has felt very weak and tired. Patient took Imodium after the initial episode and feels that this helped some. Patient has had decreased oral intake over this period time due to a general feeling of anorexia. Patient takes aspirin 81 mg but no other NSAIDs. Patient states he has had regular colonoscopies but is unsure of the results or timing of his last one. The problem is an acute onset and intermittent. Symptoms of fatigue and general weakness are constant and getting worse. Nothing makes symptoms better or worse.  Hospital Course:   GI Bleed -Has had no further episodes since admission. -Colonoscopy 8/24: Findings:  Prep satisfactory. Mucosa at terminal ileum was normal. Multiple ulcers noted at cecum and ileocecal valve along with few more erosions at ascending colon. There was patchy erythema edema and loss of vascularity all the way down to rectum. Small  hemorrhoids noted below the dentate line. -This is likely the cause of bleeding. -Will continue to hold ASA.  Acute on Chronic Blood Loss Anemia -Secondary to above.  -No need for transfusion. -Hb stable at around 10 since admission.  SCC of Lung -S/p resection and radiation therapy. -F/u with OP providers as scheduled.  Generalized Weakness -Likely related to GI Bleed. -Will arrange [facility] PT per PT recommendations.  CKD Stage III -Cr is better than baseline of around 1.2-1.4.  Transaminitis -Improved overnight. -?relationship to ETOH abuse.  Tobacco Abuse -Counseled on cessation. Now only chews tobacco.  HTN -Well controlled. -Continue Cardura.  CAD -Stable, no chest pain -H/0 5 vessel CABG in 1999. -ASA on hold due to GI bleed.  H/o Insomnia -Continue doxylamine    Procedures:  Colonoscopy 8/24 as above   Consultations:  GI, Dr. Laural Golden  Discharge Instructions  Discharge Instructions    Increase activity slowly    Complete by:  As directed             Medication List    STOP taking these medications        aspirin EC 81 MG tablet     dextromethorphan-guaiFENesin 30-600 MG per 12 hr tablet  Commonly known as:  [place]ain DM      TAKE these medications        atorvastatin 10 MG tablet  Commonly known as:  LIPITOR  TAKE ONE TABLET BY MOUTH ONE TIME DAILY     clonazePAM 1 MG tablet  Commonly known as:  KLONOPIN  TAKE ONE TABLET BY MOUTH THREE TIMES DAILY     doxazosin 2 MG tablet  Commonly known as:  CARDURA  TAKE ONE TABLET BY MOUTH ONE TIME DAILY     HEMOCYTE PLUS 106-1 MG Caps  Take 1 tablet by mouth daily.     SLEEP AID PO  Take 1 tablet by mouth at bedtime.       No Known Allergies     Follow-up Information    Follow up with [name], [name]. Schedule an appointment as soon as possible for a visit in 2 weeks.   Specialty:  Nurse Practitioner   Contact information:   [place] [place]  [phone]        The results of significant diagnostics from this hospitalization (including imaging, microbiology, ancillary and laboratory) are listed below for reference.    Significant Diagnostic Studies: US Abdomen Complete  09/30/2014   CLINICAL DATA:  Abnormal LFTs. Cholecystectomy in 2000. Previous hernia repair. History of lung cancer, hypertension, hypercholesterolemia.  EXAM: ULTRASOUND ABDOMEN COMPLETE  COMPARISON:  None  FINDINGS: Gallbladder: Status post cholecystectomy.  Common bile duct: Diameter: 4.8 mm  Liver: No focal lesion identified. Within normal limits in parenchymal echogenicity.  IVC: No abnormality visualized.  Pancreas: No focal mass.  Pancreatic duct is upper normal.  Spleen: Size and appearance within normal limits.  Right Kidney: Length: 9.5 cm. Echogenicity within normal limits. No mass or hydronephrosis visualized.  Left Kidney: Length: 10.3 cm. Echogenicity within normal limits. No mass or hydronephrosis visualized.  Abdominal aorta: Partially obscured by bowel gas.  Other findings: None.  IMPRESSION: 1. Status post cholecystectomy. 2. No evidence for acute abnormality.   Electronically Signed   By: Nolon Nations M.D.   On: 09/30/2014 09:38    Microbiology: No results found for this or any previous visit (from the past 240 hour(s)).   Labs: Basic Metabolic Panel:  Recent Labs Lab 09/28/14 1330 09/29/14 0700 09/30/14 0522  NA 139 139 139  K 3.9 3.9 3.7  CL 104 106 105  CO2 '27 28 29  '$ GLUCOSE 113* 95 118*  BUN 28* 26* 13  CREATININE 1.29* 1.09 0.97  CALCIUM 8.6* 8.3* 8.6*   Liver Function Tests:  Recent Labs Lab 09/28/14 1330 09/29/14 0700 09/30/14 0522  AST 158* 69* 39  ALT 199* 127* 89*  ALKPHOS 129* 99 103  BILITOT 0.7 0.7 0.3  PROT 6.7 5.8* 5.9*  ALBUMIN 3.2* 2.7* 2.8*   No results for input(s): LIPASE, AMYLASE in the last 168 hours. No results for input(s): AMMONIA in the last 168 hours. CBC:  Recent Labs Lab  09/28/14 1330 09/29/14 0700 09/29/14 1940 09/30/14 0522  WBC 5.3 4.3 4.2 4.2  NEUTROABS 3.2  --  1.9  --   HGB 10.9* 9.9* 10.4* 10.1*  HCT 33.9* 30.4* 32.2* 31.0*  MCV 96.6 96.2 95.3 95.1  PLT 136* 130* 138* 150   Cardiac Enzymes: No results for input(s): CKTOTAL, CKMB, CKMBINDEX, TROPONINI in the last 168 hours. BNP: BNP (last 3 results) No results for input(s): BNP in the last 8760 hours.  ProBNP (last 3 results) No results for input(s): PROBNP in the last 8760 hours.  CBG: No results for input(s): GLUCAP in the last 168 hours.     SignedLelon Frohlich  [facility] Pager: [phone] 09/30/2014, 9:55 AM

## 2014-10-01 ENCOUNTER — Ambulatory Visit: Payer: Commercial Managed Care - HMO | Admitting: Nurse Practitioner

## 2014-10-01 DIAGNOSIS — I252 Old myocardial infarction: Secondary | ICD-10-CM | POA: Diagnosis not present

## 2014-10-01 DIAGNOSIS — N183 Chronic kidney disease, stage 3 (moderate): Secondary | ICD-10-CM | POA: Diagnosis not present

## 2014-10-01 DIAGNOSIS — I129 Hypertensive chronic kidney disease with stage 1 through stage 4 chronic kidney disease, or unspecified chronic kidney disease: Secondary | ICD-10-CM | POA: Diagnosis not present

## 2014-10-01 DIAGNOSIS — R531 Weakness: Secondary | ICD-10-CM | POA: Diagnosis not present

## 2014-10-01 DIAGNOSIS — I2510 Atherosclerotic heart disease of native coronary artery without angina pectoris: Secondary | ICD-10-CM | POA: Diagnosis not present

## 2014-10-01 DIAGNOSIS — J449 Chronic obstructive pulmonary disease, unspecified: Secondary | ICD-10-CM | POA: Diagnosis not present

## 2014-10-01 DIAGNOSIS — F419 Anxiety disorder, unspecified: Secondary | ICD-10-CM | POA: Diagnosis not present

## 2014-10-01 DIAGNOSIS — J439 Emphysema, unspecified: Secondary | ICD-10-CM | POA: Diagnosis not present

## 2014-10-01 DIAGNOSIS — K922 Gastrointestinal hemorrhage, unspecified: Secondary | ICD-10-CM | POA: Diagnosis not present

## 2014-10-01 LAB — GI PATHOGEN PANEL BY PCR, STOOL
C difficile toxin A/B: NOT DETECTED
Cryptosporidium by PCR: NOT DETECTED
E coli (ETEC) LT/ST: NOT DETECTED
E coli (STEC): NOT DETECTED
E coli 0157 by PCR: NOT DETECTED
G lamblia by PCR: NOT DETECTED
Norovirus GI/GII: NOT DETECTED
Rotavirus A by PCR: NOT DETECTED
Salmonella by PCR: NOT DETECTED
Shigella by PCR: NOT DETECTED

## 2014-10-04 DIAGNOSIS — K922 Gastrointestinal hemorrhage, unspecified: Secondary | ICD-10-CM | POA: Diagnosis not present

## 2014-10-04 DIAGNOSIS — J449 Chronic obstructive pulmonary disease, unspecified: Secondary | ICD-10-CM | POA: Diagnosis not present

## 2014-10-04 DIAGNOSIS — F419 Anxiety disorder, unspecified: Secondary | ICD-10-CM | POA: Diagnosis not present

## 2014-10-04 DIAGNOSIS — R531 Weakness: Secondary | ICD-10-CM | POA: Diagnosis not present

## 2014-10-04 DIAGNOSIS — I2510 Atherosclerotic heart disease of native coronary artery without angina pectoris: Secondary | ICD-10-CM | POA: Diagnosis not present

## 2014-10-04 DIAGNOSIS — I129 Hypertensive chronic kidney disease with stage 1 through stage 4 chronic kidney disease, or unspecified chronic kidney disease: Secondary | ICD-10-CM | POA: Diagnosis not present

## 2014-10-04 DIAGNOSIS — N183 Chronic kidney disease, stage 3 (moderate): Secondary | ICD-10-CM | POA: Diagnosis not present

## 2014-10-04 DIAGNOSIS — I252 Old myocardial infarction: Secondary | ICD-10-CM | POA: Diagnosis not present

## 2014-10-04 DIAGNOSIS — J439 Emphysema, unspecified: Secondary | ICD-10-CM | POA: Diagnosis not present

## 2014-10-05 DIAGNOSIS — F419 Anxiety disorder, unspecified: Secondary | ICD-10-CM | POA: Diagnosis not present

## 2014-10-05 DIAGNOSIS — K922 Gastrointestinal hemorrhage, unspecified: Secondary | ICD-10-CM | POA: Diagnosis not present

## 2014-10-05 DIAGNOSIS — R531 Weakness: Secondary | ICD-10-CM | POA: Diagnosis not present

## 2014-10-05 DIAGNOSIS — J439 Emphysema, unspecified: Secondary | ICD-10-CM | POA: Diagnosis not present

## 2014-10-05 DIAGNOSIS — I252 Old myocardial infarction: Secondary | ICD-10-CM | POA: Diagnosis not present

## 2014-10-05 DIAGNOSIS — J449 Chronic obstructive pulmonary disease, unspecified: Secondary | ICD-10-CM | POA: Diagnosis not present

## 2014-10-05 DIAGNOSIS — I2510 Atherosclerotic heart disease of native coronary artery without angina pectoris: Secondary | ICD-10-CM | POA: Diagnosis not present

## 2014-10-05 DIAGNOSIS — I129 Hypertensive chronic kidney disease with stage 1 through stage 4 chronic kidney disease, or unspecified chronic kidney disease: Secondary | ICD-10-CM | POA: Diagnosis not present

## 2014-10-05 DIAGNOSIS — N183 Chronic kidney disease, stage 3 (moderate): Secondary | ICD-10-CM | POA: Diagnosis not present

## 2014-10-06 ENCOUNTER — Other Ambulatory Visit: Payer: Self-pay | Admitting: Nurse Practitioner

## 2014-10-07 NOTE — Telephone Encounter (Signed)
Please call in klonopin with 1 refills 

## 2014-10-07 NOTE — Telephone Encounter (Signed)
Last seen 06/18/14  MMM If approved route to nurse to call into [facility]

## 2014-10-07 NOTE — Telephone Encounter (Signed)
rx called to pharmacy 

## 2014-10-08 DIAGNOSIS — I129 Hypertensive chronic kidney disease with stage 1 through stage 4 chronic kidney disease, or unspecified chronic kidney disease: Secondary | ICD-10-CM | POA: Diagnosis not present

## 2014-10-08 DIAGNOSIS — J439 Emphysema, unspecified: Secondary | ICD-10-CM | POA: Diagnosis not present

## 2014-10-08 DIAGNOSIS — K922 Gastrointestinal hemorrhage, unspecified: Secondary | ICD-10-CM | POA: Diagnosis not present

## 2014-10-08 DIAGNOSIS — I252 Old myocardial infarction: Secondary | ICD-10-CM | POA: Diagnosis not present

## 2014-10-08 DIAGNOSIS — I2510 Atherosclerotic heart disease of native coronary artery without angina pectoris: Secondary | ICD-10-CM | POA: Diagnosis not present

## 2014-10-08 DIAGNOSIS — F419 Anxiety disorder, unspecified: Secondary | ICD-10-CM | POA: Diagnosis not present

## 2014-10-08 DIAGNOSIS — N183 Chronic kidney disease, stage 3 (moderate): Secondary | ICD-10-CM | POA: Diagnosis not present

## 2014-10-08 DIAGNOSIS — J449 Chronic obstructive pulmonary disease, unspecified: Secondary | ICD-10-CM | POA: Diagnosis not present

## 2014-10-08 DIAGNOSIS — R531 Weakness: Secondary | ICD-10-CM | POA: Diagnosis not present

## 2014-10-11 DIAGNOSIS — K922 Gastrointestinal hemorrhage, unspecified: Secondary | ICD-10-CM | POA: Diagnosis not present

## 2014-10-11 DIAGNOSIS — F419 Anxiety disorder, unspecified: Secondary | ICD-10-CM | POA: Diagnosis not present

## 2014-10-11 DIAGNOSIS — R531 Weakness: Secondary | ICD-10-CM | POA: Diagnosis not present

## 2014-10-11 DIAGNOSIS — I252 Old myocardial infarction: Secondary | ICD-10-CM | POA: Diagnosis not present

## 2014-10-11 DIAGNOSIS — J439 Emphysema, unspecified: Secondary | ICD-10-CM | POA: Diagnosis not present

## 2014-10-11 DIAGNOSIS — I129 Hypertensive chronic kidney disease with stage 1 through stage 4 chronic kidney disease, or unspecified chronic kidney disease: Secondary | ICD-10-CM | POA: Diagnosis not present

## 2014-10-11 DIAGNOSIS — N183 Chronic kidney disease, stage 3 (moderate): Secondary | ICD-10-CM | POA: Diagnosis not present

## 2014-10-11 DIAGNOSIS — J449 Chronic obstructive pulmonary disease, unspecified: Secondary | ICD-10-CM | POA: Diagnosis not present

## 2014-10-11 DIAGNOSIS — I2510 Atherosclerotic heart disease of native coronary artery without angina pectoris: Secondary | ICD-10-CM | POA: Diagnosis not present

## 2014-10-13 DIAGNOSIS — F419 Anxiety disorder, unspecified: Secondary | ICD-10-CM | POA: Diagnosis not present

## 2014-10-13 DIAGNOSIS — J449 Chronic obstructive pulmonary disease, unspecified: Secondary | ICD-10-CM | POA: Diagnosis not present

## 2014-10-13 DIAGNOSIS — R531 Weakness: Secondary | ICD-10-CM | POA: Diagnosis not present

## 2014-10-13 DIAGNOSIS — I129 Hypertensive chronic kidney disease with stage 1 through stage 4 chronic kidney disease, or unspecified chronic kidney disease: Secondary | ICD-10-CM | POA: Diagnosis not present

## 2014-10-13 DIAGNOSIS — K922 Gastrointestinal hemorrhage, unspecified: Secondary | ICD-10-CM | POA: Diagnosis not present

## 2014-10-13 DIAGNOSIS — I2510 Atherosclerotic heart disease of native coronary artery without angina pectoris: Secondary | ICD-10-CM | POA: Diagnosis not present

## 2014-10-13 DIAGNOSIS — N183 Chronic kidney disease, stage 3 (moderate): Secondary | ICD-10-CM | POA: Diagnosis not present

## 2014-10-13 DIAGNOSIS — J439 Emphysema, unspecified: Secondary | ICD-10-CM | POA: Diagnosis not present

## 2014-10-13 DIAGNOSIS — I252 Old myocardial infarction: Secondary | ICD-10-CM | POA: Diagnosis not present

## 2014-10-15 ENCOUNTER — Encounter: Payer: Self-pay | Admitting: Nurse Practitioner

## 2014-10-15 ENCOUNTER — Ambulatory Visit (INDEPENDENT_AMBULATORY_CARE_PROVIDER_SITE_OTHER): Payer: Commercial Managed Care - HMO | Admitting: Nurse Practitioner

## 2014-10-15 VITALS — BP 106/62 | HR 85 | Temp 97.9°F | Ht 67.0 in | Wt 138.0 lb

## 2014-10-15 DIAGNOSIS — K922 Gastrointestinal hemorrhage, unspecified: Secondary | ICD-10-CM | POA: Diagnosis not present

## 2014-10-15 DIAGNOSIS — Z09 Encounter for follow-up examination after completed treatment for conditions other than malignant neoplasm: Secondary | ICD-10-CM

## 2014-10-15 DIAGNOSIS — R0989 Other specified symptoms and signs involving the circulatory and respiratory systems: Secondary | ICD-10-CM

## 2014-10-15 LAB — POCT HEMOGLOBIN: Hemoglobin: 9.4 g/dL — AB (ref 14.1–18.1)

## 2014-10-15 NOTE — Patient Instructions (Signed)
Gastrointestinal Bleeding °Gastrointestinal bleeding is bleeding somewhere along the path that food travels through the body (digestive tract). This path is anywhere between the mouth and the opening of the butt (anus). You may have blood in your throw up (vomit) or in your poop (stools). If there is a lot of bleeding, you may need to stay in the hospital. °HOME CARE °· Only take medicine as told by your doctor. °· Eat foods with fiber such as whole grains, fruits, and vegetables. You can also try eating 1 to 3 prunes a day. °· Drink enough fluids to keep your pee (urine) clear or pale yellow. °GET HELP RIGHT AWAY IF:  °· Your bleeding gets worse. °· You feel dizzy, weak, or you pass out (faint). °· You have bad cramps in your back or belly (abdomen). °· You have large blood clumps (clots) in your poop. °· Your problems are getting worse. °MAKE SURE YOU:  °· Understand these instructions. °· Will watch your condition. °· Will get help right away if you are not doing well or get worse. °Document Released: 11/01/2007 Document Revised: 01/09/2012 Document Reviewed: 01/01/2011 °ExitCare® Patient Information ©2015 [employer]. This information is not intended to replace advice given to you by your health care provider. Make sure you discuss any questions you have with your health care provider. ° ° °

## 2014-10-15 NOTE — Progress Notes (Signed)
   Subjective:    Patient ID: Steven [name], male    DOB: 1929/01/21, 81 y.o.   MRN: [id number]  HPI Patient in today for hospital follow up. He said he was having rectal bleeding. They kept him for 3 days and ran a bunch of test which were all negative. Including a negative colonoscopy. He was discharged on August 25. Hgb 10.1 upon discahrge. He denies any rectal bleeding since discharge. Says he feels fine now.    Review of Systems  Constitutional: Negative.   HENT: Negative.   Respiratory: Negative.   Cardiovascular: Negative.   Gastrointestinal: Positive for constipation. Negative for nausea, abdominal pain, diarrhea and blood in stool.  Genitourinary: Negative.   Musculoskeletal: Negative.   Neurological: Negative.   Psychiatric/Behavioral: Negative.   All other systems reviewed and are negative.      Objective:   Physical Exam  Constitutional: He appears well-developed and well-nourished.  Cardiovascular: Normal rate, regular rhythm and normal heart sounds.   Pulmonary/Chest: Effort normal and breath sounds normal.  Abdominal: Soft. Bowel sounds are normal. He exhibits distension. There is tenderness.  Abdominal bruit bil    BP 106/62 mmHg  Pulse 85  Temp(Src) 97.9 F (36.6 C) (Oral)  Ht '5\' 7"'$  (1.702 m)  Wt 138 lb (62.596 kg)  BMI 21.61 kg/m2       Assessment & Plan:  1. Hospital discharge follow-up Hospital records reviewed  2. Lower GI bleed Watch for any new bleeding - POCT hemoglobin  3. Abdominal bruit - [place] Art/Ven Flow Abd Pelv Doppler; Future   Mary-Margaret Hassell Done, [name]

## 2014-10-18 DIAGNOSIS — J439 Emphysema, unspecified: Secondary | ICD-10-CM | POA: Diagnosis not present

## 2014-10-18 DIAGNOSIS — R531 Weakness: Secondary | ICD-10-CM | POA: Diagnosis not present

## 2014-10-18 DIAGNOSIS — F419 Anxiety disorder, unspecified: Secondary | ICD-10-CM | POA: Diagnosis not present

## 2014-10-18 DIAGNOSIS — N183 Chronic kidney disease, stage 3 (moderate): Secondary | ICD-10-CM | POA: Diagnosis not present

## 2014-10-18 DIAGNOSIS — I252 Old myocardial infarction: Secondary | ICD-10-CM | POA: Diagnosis not present

## 2014-10-18 DIAGNOSIS — J449 Chronic obstructive pulmonary disease, unspecified: Secondary | ICD-10-CM | POA: Diagnosis not present

## 2014-10-18 DIAGNOSIS — I2510 Atherosclerotic heart disease of native coronary artery without angina pectoris: Secondary | ICD-10-CM | POA: Diagnosis not present

## 2014-10-18 DIAGNOSIS — I129 Hypertensive chronic kidney disease with stage 1 through stage 4 chronic kidney disease, or unspecified chronic kidney disease: Secondary | ICD-10-CM | POA: Diagnosis not present

## 2014-10-18 DIAGNOSIS — K922 Gastrointestinal hemorrhage, unspecified: Secondary | ICD-10-CM | POA: Diagnosis not present

## 2014-10-20 DIAGNOSIS — J439 Emphysema, unspecified: Secondary | ICD-10-CM | POA: Diagnosis not present

## 2014-10-20 DIAGNOSIS — I252 Old myocardial infarction: Secondary | ICD-10-CM | POA: Diagnosis not present

## 2014-10-20 DIAGNOSIS — R531 Weakness: Secondary | ICD-10-CM | POA: Diagnosis not present

## 2014-10-20 DIAGNOSIS — J449 Chronic obstructive pulmonary disease, unspecified: Secondary | ICD-10-CM | POA: Diagnosis not present

## 2014-10-20 DIAGNOSIS — I2510 Atherosclerotic heart disease of native coronary artery without angina pectoris: Secondary | ICD-10-CM | POA: Diagnosis not present

## 2014-10-20 DIAGNOSIS — I129 Hypertensive chronic kidney disease with stage 1 through stage 4 chronic kidney disease, or unspecified chronic kidney disease: Secondary | ICD-10-CM | POA: Diagnosis not present

## 2014-10-20 DIAGNOSIS — F419 Anxiety disorder, unspecified: Secondary | ICD-10-CM | POA: Diagnosis not present

## 2014-10-20 DIAGNOSIS — K922 Gastrointestinal hemorrhage, unspecified: Secondary | ICD-10-CM | POA: Diagnosis not present

## 2014-10-20 DIAGNOSIS — N183 Chronic kidney disease, stage 3 (moderate): Secondary | ICD-10-CM | POA: Diagnosis not present

## 2014-10-21 ENCOUNTER — Telehealth: Payer: Self-pay

## 2014-10-21 NOTE — Telephone Encounter (Signed)
Well i guess doesn't need- he just had abdominal bruit but i did not realize he had had one recently

## 2014-10-21 NOTE — Telephone Encounter (Signed)
Ultrasound tech needs to know what your looking for in doing the US venous abd pelvic doppler    Just had an abdominal US 09/30/14 that was negative

## 2014-10-21 NOTE — Telephone Encounter (Signed)
x

## 2014-10-22 ENCOUNTER — Ambulatory Visit (HOSPITAL_COMMUNITY): Admission: RE | Admit: 2014-10-22 | Payer: Commercial Managed Care - HMO | Source: Ambulatory Visit

## 2014-12-04 ENCOUNTER — Other Ambulatory Visit: Payer: Self-pay | Admitting: Nurse Practitioner

## 2014-12-06 ENCOUNTER — Encounter (INDEPENDENT_AMBULATORY_CARE_PROVIDER_SITE_OTHER): Payer: Commercial Managed Care - HMO | Admitting: Family Medicine

## 2014-12-06 ENCOUNTER — Other Ambulatory Visit: Payer: Self-pay | Admitting: Nurse Practitioner

## 2014-12-06 DIAGNOSIS — I129 Hypertensive chronic kidney disease with stage 1 through stage 4 chronic kidney disease, or unspecified chronic kidney disease: Secondary | ICD-10-CM

## 2014-12-06 DIAGNOSIS — K922 Gastrointestinal hemorrhage, unspecified: Secondary | ICD-10-CM | POA: Diagnosis not present

## 2014-12-06 DIAGNOSIS — R531 Weakness: Secondary | ICD-10-CM

## 2014-12-06 DIAGNOSIS — N183 Chronic kidney disease, stage 3 (moderate): Secondary | ICD-10-CM

## 2014-12-06 NOTE — Telephone Encounter (Signed)
Refill called to [facility]

## 2014-12-06 NOTE — Telephone Encounter (Signed)
Please call in klonipin with 1 refills

## 2014-12-06 NOTE — Telephone Encounter (Signed)
Last filled 11/04/14, last seen 10/15/14. Call in at [facility]

## 2014-12-07 ENCOUNTER — Telehealth: Payer: Self-pay | Admitting: *Deleted

## 2014-12-07 NOTE — Telephone Encounter (Signed)
Please advise on refill- last seen 10/15/14, f/u appointment with MMM on 01/20/15.

## 2014-12-07 NOTE — Telephone Encounter (Signed)
rx called into pharmacy

## 2014-12-07 NOTE — Telephone Encounter (Signed)
Please call in klonopin with 1 refills 

## 2014-12-15 ENCOUNTER — Ambulatory Visit: Payer: Commercial Managed Care - HMO | Admitting: Surgery

## 2015-01-20 ENCOUNTER — Encounter: Payer: Self-pay | Admitting: Nurse Practitioner

## 2015-01-20 ENCOUNTER — Ambulatory Visit (INDEPENDENT_AMBULATORY_CARE_PROVIDER_SITE_OTHER): Payer: Commercial Managed Care - HMO | Admitting: Nurse Practitioner

## 2015-01-20 VITALS — BP 142/70 | HR 87 | Temp 97.0°F | Ht 67.0 in | Wt 141.0 lb

## 2015-01-20 DIAGNOSIS — F411 Generalized anxiety disorder: Secondary | ICD-10-CM | POA: Diagnosis not present

## 2015-01-20 DIAGNOSIS — I2510 Atherosclerotic heart disease of native coronary artery without angina pectoris: Secondary | ICD-10-CM

## 2015-01-20 DIAGNOSIS — N400 Enlarged prostate without lower urinary tract symptoms: Secondary | ICD-10-CM

## 2015-01-20 DIAGNOSIS — J439 Emphysema, unspecified: Secondary | ICD-10-CM | POA: Diagnosis not present

## 2015-01-20 DIAGNOSIS — F528 Other sexual dysfunction not due to a substance or known physiological condition: Secondary | ICD-10-CM | POA: Diagnosis not present

## 2015-01-20 DIAGNOSIS — E785 Hyperlipidemia, unspecified: Secondary | ICD-10-CM | POA: Diagnosis not present

## 2015-01-20 DIAGNOSIS — I10 Essential (primary) hypertension: Secondary | ICD-10-CM | POA: Diagnosis not present

## 2015-01-20 MED ORDER — CLONAZEPAM 1 MG PO TABS: 1.0000 mg | ORAL_TABLET | Freq: Three times a day (TID) | ORAL | Status: DC | PRN

## 2015-01-20 MED ORDER — ATORVASTATIN CALCIUM 10 MG PO TABS: 10.0000 mg | ORAL_TABLET | Freq: Every day | ORAL | Status: DC

## 2015-01-20 MED ORDER — DOXAZOSIN MESYLATE 2 MG PO TABS: 2.0000 mg | ORAL_TABLET | Freq: Every day | ORAL | Status: DC

## 2015-01-20 NOTE — Patient Instructions (Signed)

## 2015-01-20 NOTE — Progress Notes (Signed)
Subjective:    Patient ID: Steven [name], male    DOB: [birth year], 81 y.o.   MRN: [id number]  Patient here today for follow up of chronic medical conditions.  Hypertension This is a chronic problem. The problem is controlled. Associated symptoms include anxiety. Pertinent negatives include no palpitations, peripheral edema or shortness of breath. Risk factors for coronary artery disease include male gender, dyslipidemia and family history. Past treatments include alpha 1 blockers. The current treatment provides moderate improvement. Hypertensive end-organ damage includes heart failure. There is no history of a thyroid problem.  Hyperlipidemia This is a chronic problem. The current episode started more than 1 year ago. The problem is controlled. Recent lipid tests were reviewed and are normal. Pertinent negatives include no shortness of breath. Current antihyperlipidemic treatment includes statins. The current treatment provides significant improvement of lipids. Risk factors for coronary artery disease include dyslipidemia, family history and hypertension.  Anxiety Presents for follow-up visit. Patient reports no insomnia, palpitations or shortness of breath. Symptoms occur rarely. The severity of symptoms is mild. The quality of sleep is good. Nighttime awakenings: none.    BPH Currently on cardura which helps with his flow- ocassionaly has problems with urine- but usually does well. COPD Does not do inhalers anymore- still smokes occasionaly ED Not currently in a relationship right now so does not really matter      Review of Systems  Constitutional: Negative.   HENT: Negative.   Respiratory: Negative for shortness of breath.   Cardiovascular: Negative for palpitations.  Genitourinary: Negative.   Neurological: Negative.   Psychiatric/Behavioral: Negative.  The patient does not have insomnia.   All other systems reviewed and are negative.      Objective:   Physical Exam   Constitutional: He is oriented to person, place, and time. He appears well-developed and well-nourished.  HENT:  Head: Normocephalic.  Right Ear: External ear normal.  Left Ear: External ear normal.  Eyes: Pupils are equal, round, and reactive to light.  Neck: Normal range of motion. Neck supple. No thyromegaly present.  Cardiovascular: Normal rate, regular rhythm and intact distal pulses.   Murmur heard. Pulmonary/Chest: Effort normal and breath sounds normal.  Abdominal: Soft. Bowel sounds are normal. He exhibits no distension. There is no tenderness.  Musculoskeletal: Normal range of motion. He exhibits no edema.  Neurological: He is alert and oriented to person, place, and time.  Skin: Skin is warm and dry.  Psychiatric: He has a normal mood and affect. His behavior is normal. Judgment and thought content normal.   BP 142/70 mmHg  Pulse 87  Temp(Src) 97 F (36.1 C) (Oral)  Ht _0  (1.702 m)  Wt 141 lb (63.957 kg)  BMI 22.08 kg/m2          Assessment & Plan:   1. Essential hypertension Do not add salt to diet - doxazosin (CARDURA) 2 MG tablet; Take 1 tablet (2 mg total) by mouth daily.  Dispense: 90 tablet; Refill: 1 - CMP14+EGFR  2. Atherosclerosis of native coronary artery of native heart without angina pectoris  3. Pulmonary emphysema, unspecified emphysema type ([place])  4. BPH (benign prostatic hyperplasia) - PSA, total and free  5. Hyperlipidemia with target LDL less than 100 low fat diet - atorvastatin (LIPITOR) 10 MG tablet; Take 1 tablet (10 mg total) by mouth daily.  Dispense: 90 tablet; Refill: 1 - Lipid panel  6. Anxiety state Stress management - clonazePAM (KLONOPIN) 1 MG tablet; Take 1 tablet (1 mg total) by  mouth 3 (three) times daily as needed.  Dispense: 90 tablet; Refill: 2  8. ERECTILE DYSFUNCTION    Labs pending Health maintenance reviewed Diet and exercise encouraged Continue all meds Follow up  In 3 months   [place],  [name]

## 2015-01-21 ENCOUNTER — Other Ambulatory Visit: Payer: Self-pay | Admitting: *Deleted

## 2015-01-21 DIAGNOSIS — R972 Elevated prostate specific antigen [PSA]: Secondary | ICD-10-CM

## 2015-01-21 LAB — CMP14+EGFR
ALT: 13 IU/L (ref 0–44)
AST: 20 IU/L (ref 0–40)
Albumin/Globulin Ratio: 1.4 (ref 1.1–2.5)
Albumin: 4.2 g/dL (ref 3.5–4.7)
Alkaline Phosphatase: 68 IU/L (ref 39–117)
BUN/Creatinine Ratio: 15 (ref 10–22)
BUN: 18 mg/dL (ref 8–27)
Bilirubin Total: 0.3 mg/dL (ref 0.0–1.2)
CO2: 28 mmol/L (ref 18–29)
Calcium: 9.8 mg/dL (ref 8.6–10.2)
Chloride: 99 mmol/L (ref 96–106)
Creatinine, Ser: 1.22 mg/dL (ref 0.76–1.27)
GFR calc Af Amer: 63 mL/min/{1.73_m2} (ref >59)
GFR calc non Af Amer: 54 mL/min/{1.73_m2} — ABNORMAL LOW (ref >59)
Globulin, Total: 3 g/dL (ref 1.5–4.5)
Glucose: 100 mg/dL — ABNORMAL HIGH (ref 65–99)
Potassium: 5 mmol/L (ref 3.5–5.2)
Sodium: 140 mmol/L (ref 134–144)
Total Protein: 7.2 g/dL (ref 6.0–8.5)

## 2015-01-21 LAB — LIPID PANEL
Chol/HDL Ratio: 2.7 ratio units (ref 0.0–5.0)
Cholesterol, Total: 119 mg/dL (ref 100–199)
HDL: 44 mg/dL (ref >39)
LDL Calculated: 46 mg/dL (ref 0–99)
Triglycerides: 143 mg/dL (ref 0–149)
VLDL Cholesterol Cal: 29 mg/dL (ref 5–40)

## 2015-01-21 LAB — PSA, TOTAL AND FREE
PSA, Free Pct: 23.9 %
PSA, Free: 0.86 ng/mL
Prostate Specific Ag, Serum: 3.6 ng/mL (ref 0.0–4.0)

## 2015-02-25 ENCOUNTER — Ambulatory Visit (INDEPENDENT_AMBULATORY_CARE_PROVIDER_SITE_OTHER): Payer: Commercial Managed Care - HMO | Admitting: Urology

## 2015-02-25 DIAGNOSIS — R972 Elevated prostate specific antigen [PSA]: Secondary | ICD-10-CM

## 2015-02-25 DIAGNOSIS — N3941 Urge incontinence: Secondary | ICD-10-CM

## 2015-02-25 DIAGNOSIS — N401 Enlarged prostate with lower urinary tract symptoms: Secondary | ICD-10-CM

## 2015-03-21 ENCOUNTER — Other Ambulatory Visit: Payer: Self-pay | Admitting: Nurse Practitioner

## 2015-04-21 ENCOUNTER — Encounter: Payer: Self-pay | Admitting: Nurse Practitioner

## 2015-04-21 ENCOUNTER — Ambulatory Visit (INDEPENDENT_AMBULATORY_CARE_PROVIDER_SITE_OTHER): Payer: Commercial Managed Care - HMO | Admitting: Nurse Practitioner

## 2015-04-21 VITALS — BP 144/74 | HR 77 | Temp 97.0°F | Ht 67.0 in | Wt 143.0 lb

## 2015-04-21 DIAGNOSIS — N400 Enlarged prostate without lower urinary tract symptoms: Secondary | ICD-10-CM

## 2015-04-21 DIAGNOSIS — F411 Generalized anxiety disorder: Secondary | ICD-10-CM

## 2015-04-21 DIAGNOSIS — J439 Emphysema, unspecified: Secondary | ICD-10-CM | POA: Diagnosis not present

## 2015-04-21 DIAGNOSIS — N183 Chronic kidney disease, stage 3 unspecified: Secondary | ICD-10-CM

## 2015-04-21 DIAGNOSIS — I10 Essential (primary) hypertension: Secondary | ICD-10-CM

## 2015-04-21 DIAGNOSIS — I2510 Atherosclerotic heart disease of native coronary artery without angina pectoris: Secondary | ICD-10-CM | POA: Diagnosis not present

## 2015-04-21 DIAGNOSIS — E785 Hyperlipidemia, unspecified: Secondary | ICD-10-CM

## 2015-04-21 DIAGNOSIS — F17208 Nicotine dependence, unspecified, with other nicotine-induced disorders: Secondary | ICD-10-CM | POA: Diagnosis not present

## 2015-04-21 DIAGNOSIS — C3490 Malignant neoplasm of unspecified part of unspecified bronchus or lung: Secondary | ICD-10-CM

## 2015-04-21 LAB — LIPID PANEL
Chol/HDL Ratio: 2.5 ratio units (ref 0.0–5.0)
Cholesterol, Total: 116 mg/dL (ref 100–199)
HDL: 47 mg/dL (ref >39)
LDL Calculated: 46 mg/dL (ref 0–99)
Triglycerides: 115 mg/dL (ref 0–149)
VLDL Cholesterol Cal: 23 mg/dL (ref 5–40)

## 2015-04-21 LAB — CMP14+EGFR
ALT: 14 IU/L (ref 0–44)
AST: 22 IU/L (ref 0–40)
Albumin/Globulin Ratio: 1.3 (ref 1.2–2.2)
Albumin: 3.9 g/dL (ref 3.5–4.7)
Alkaline Phosphatase: 72 IU/L (ref 39–117)
BUN/Creatinine Ratio: 14 (ref 10–22)
BUN: 18 mg/dL (ref 8–27)
Bilirubin Total: 0.4 mg/dL (ref 0.0–1.2)
CO2: 25 mmol/L (ref 18–29)
Calcium: 9.4 mg/dL (ref 8.6–10.2)
Chloride: 100 mmol/L (ref 96–106)
Creatinine, Ser: 1.26 mg/dL (ref 0.76–1.27)
GFR calc Af Amer: 60 mL/min/{1.73_m2} (ref >59)
GFR calc non Af Amer: 52 mL/min/{1.73_m2} — ABNORMAL LOW (ref >59)
Globulin, Total: 3.1 g/dL (ref 1.5–4.5)
Glucose: 90 mg/dL (ref 65–99)
Potassium: 5.1 mmol/L (ref 3.5–5.2)
Sodium: 140 mmol/L (ref 134–144)
Total Protein: 7 g/dL (ref 6.0–8.5)

## 2015-04-21 MED ORDER — LISINOPRIL 10 MG PO TABS: 10.0000 mg | ORAL_TABLET | Freq: Every day | ORAL | Status: DC

## 2015-04-21 MED ORDER — CLONAZEPAM 1 MG PO TABS: 1.0000 mg | ORAL_TABLET | Freq: Three times a day (TID) | ORAL | Status: DC | PRN

## 2015-04-21 MED ORDER — DOXAZOSIN MESYLATE 2 MG PO TABS: 2.0000 mg | ORAL_TABLET | Freq: Every day | ORAL | Status: DC

## 2015-04-21 MED ORDER — ATORVASTATIN CALCIUM 10 MG PO TABS: 10.0000 mg | ORAL_TABLET | Freq: Every day | ORAL | Status: DC

## 2015-04-21 NOTE — Patient Instructions (Signed)

## 2015-04-21 NOTE — Progress Notes (Signed)
Subjective:    Patient ID: Steven [name], male    DOB: 1928-04-30, 82 y.o.   MRN: [id number]  Patient here today for follow up of chronic medical problems.  Outpatient Encounter Prescriptions as of 04/21/2015  Medication Sig  . atorvastatin (LIPITOR) 10 MG tablet TAKE ONE TABLET BY MOUTH ONE TIME DAILY  . clonazePAM (KLONOPIN) 1 MG tablet Take 1 tablet (1 mg total) by mouth 3 (three) times daily as needed.  . doxazosin (CARDURA) 2 MG tablet TAKE ONE TABLET BY MOUTH ONE TIME DAILY  . Doxylamine Succinate, Sleep, (SLEEP AID PO) Take 1 tablet by mouth at bedtime.  . Multiple Vitamins-Iron (MULTIVITAMINS WITH IRON) TABS tablet Take 1 tablet by mouth daily.   No facility-administered encounter medications on file as of 04/21/2015.     Hypertension This is a chronic problem. The problem is controlled. Associated symptoms include anxiety. Pertinent negatives include no palpitations, peripheral edema or shortness of breath. Risk factors for coronary artery disease include male gender, dyslipidemia and family history. Past treatments include alpha 1 blockers. The current treatment provides moderate improvement. Hypertensive end-organ damage includes heart failure. There is no history of a thyroid problem.  Hyperlipidemia This is a chronic problem. The current episode started more than 1 year ago. The problem is controlled. Recent lipid tests were reviewed and are normal. Pertinent negatives include no shortness of breath. Current antihyperlipidemic treatment includes statins. The current treatment provides significant improvement of lipids. Risk factors for coronary artery disease include dyslipidemia, family history and hypertension.  Anxiety Presents for follow-up visit. Patient reports no insomnia, palpitations or shortness of breath. Symptoms occur rarely. The severity of symptoms is mild. The quality of sleep is good. Nighttime awakenings: none.    BPH Currently on cardura which helps with his  flow- ocassionaly has problems with urine- but usually does well. COPD Does not do inhalers anymore- still smokes occasionaly ED Not currently in a relationship right now so does not really matter Hx lung cancer No cough- has not followed up with oncology in over a year CKD Currently just watching labs  Review of Systems  Constitutional: Negative.   HENT: Negative.   Respiratory: Negative for shortness of breath.   Cardiovascular: Negative for palpitations.  Genitourinary: Negative.   Neurological: Negative.   Psychiatric/Behavioral: Negative.  The patient does not have insomnia.   All other systems reviewed and are negative.      Objective:   Physical Exam  Constitutional: He is oriented to person, place, and time. He appears well-developed and well-nourished.  HENT:  Head: Normocephalic.  Right Ear: External ear normal.  Left Ear: External ear normal.  Eyes: Pupils are equal, round, and reactive to light.  Neck: Normal range of motion. Neck supple. No thyromegaly present.  Cardiovascular: Normal rate, regular rhythm and intact distal pulses.   Murmur heard. Pulmonary/Chest: Effort normal and breath sounds normal.  Abdominal: Soft. Bowel sounds are normal. He exhibits no distension. There is no tenderness.  Musculoskeletal: Normal range of motion. He exhibits no edema.  Neurological: He is alert and oriented to person, place, and time.  Skin: Skin is warm and dry.  Psychiatric: He has a normal mood and affect. His behavior is normal. Judgment and thought content normal.    BP 144/74 mmHg  Pulse 77  Temp(Src) 97 F (36.1 C) (Oral)  Ht 5' 7"  (1.702 m)  Wt 143 lb (64.864 kg)  BMI 22.39 kg/m2          Assessment &  Plan:   1. Anxiety state Stress management - clonazePAM (KLONOPIN) 1 MG tablet; Take 1 tablet (1 mg total) by mouth 3 (three) times daily as needed.  Dispense: 90 tablet; Refill: 2  2. Essential hypertension Do not add salt to diet - CMP14+EGFR -  lisinopril (PRINIVIL,ZESTRIL) 10 MG tablet; Take 1 tablet (10 mg total) by mouth daily.  Dispense: 90 tablet; Refill: 1  3. Atherosclerosis of native coronary artery of native heart without angina pectoris - doxazosin (CARDURA) 2 MG tablet; Take 1 tablet (2 mg total) by mouth daily.  Dispense: 90 tablet; Refill: 1  4. Pulmonary emphysema, unspecified emphysema type ([place])  5. Squamous cell carcinoma of lung, unspecified laterality ([place])  6. BPH (benign prostatic hyperplasia)  7. CKD (chronic kidney disease), stage III  8. Hyperlipidemia with target LDL less than 100 Low fat diet - atorvastatin (LIPITOR) 10 MG tablet; Take 1 tablet (10 mg total) by mouth daily.  Dispense: 90 tablet; Refill: 1 - Lipid panel  9. Nicotine dependence with other nicotine-induced disorder Stop snuff    Labs pending Health maintenance reviewed Diet and exercise encouraged Continue all meds Follow up  In 3 month   [place], [name]

## 2015-07-14 ENCOUNTER — Telehealth: Payer: Self-pay | Admitting: Nurse Practitioner

## 2015-07-19 ENCOUNTER — Other Ambulatory Visit: Payer: Self-pay | Admitting: *Deleted

## 2015-07-19 DIAGNOSIS — I10 Essential (primary) hypertension: Secondary | ICD-10-CM

## 2015-07-19 DIAGNOSIS — I2510 Atherosclerotic heart disease of native coronary artery without angina pectoris: Secondary | ICD-10-CM

## 2015-07-19 DIAGNOSIS — E785 Hyperlipidemia, unspecified: Secondary | ICD-10-CM

## 2015-07-19 MED ORDER — DOXAZOSIN MESYLATE 2 MG PO TABS: 2.0000 mg | ORAL_TABLET | Freq: Every day | ORAL | Status: DC

## 2015-07-19 MED ORDER — LISINOPRIL 10 MG PO TABS: 10.0000 mg | ORAL_TABLET | Freq: Every day | ORAL | Status: DC

## 2015-07-19 MED ORDER — ATORVASTATIN CALCIUM 10 MG PO TABS: 10.0000 mg | ORAL_TABLET | Freq: Every day | ORAL | Status: DC

## 2015-08-04 ENCOUNTER — Encounter: Payer: Self-pay | Admitting: Nurse Practitioner

## 2015-08-04 ENCOUNTER — Ambulatory Visit (INDEPENDENT_AMBULATORY_CARE_PROVIDER_SITE_OTHER): Payer: Commercial Managed Care - HMO | Admitting: Nurse Practitioner

## 2015-08-04 VITALS — BP 147/65 | HR 78 | Temp 97.9°F | Ht 67.0 in | Wt 139.2 lb

## 2015-08-04 DIAGNOSIS — N183 Chronic kidney disease, stage 3 unspecified: Secondary | ICD-10-CM

## 2015-08-04 DIAGNOSIS — J439 Emphysema, unspecified: Secondary | ICD-10-CM

## 2015-08-04 DIAGNOSIS — E785 Hyperlipidemia, unspecified: Secondary | ICD-10-CM

## 2015-08-04 DIAGNOSIS — F411 Generalized anxiety disorder: Secondary | ICD-10-CM

## 2015-08-04 DIAGNOSIS — N400 Enlarged prostate without lower urinary tract symptoms: Secondary | ICD-10-CM

## 2015-08-04 DIAGNOSIS — F528 Other sexual dysfunction not due to a substance or known physiological condition: Secondary | ICD-10-CM

## 2015-08-04 DIAGNOSIS — I2510 Atherosclerotic heart disease of native coronary artery without angina pectoris: Secondary | ICD-10-CM | POA: Diagnosis not present

## 2015-08-04 DIAGNOSIS — I10 Essential (primary) hypertension: Secondary | ICD-10-CM

## 2015-08-04 LAB — CMP14+EGFR
ALT: 12 IU/L (ref 0–44)
AST: 20 IU/L (ref 0–40)
Albumin/Globulin Ratio: 1.3 (ref 1.2–2.2)
Albumin: 4 g/dL (ref 3.5–4.7)
Alkaline Phosphatase: 67 IU/L (ref 39–117)
BUN/Creatinine Ratio: 14 (ref 10–24)
BUN: 20 mg/dL (ref 8–27)
Bilirubin Total: 0.2 mg/dL (ref 0.0–1.2)
CO2: 24 mmol/L (ref 18–29)
Calcium: 9.5 mg/dL (ref 8.6–10.2)
Chloride: 100 mmol/L (ref 96–106)
Creatinine, Ser: 1.43 mg/dL — ABNORMAL HIGH (ref 0.76–1.27)
GFR calc Af Amer: 51 mL/min/{1.73_m2} — ABNORMAL LOW (ref >59)
GFR calc non Af Amer: 44 mL/min/{1.73_m2} — ABNORMAL LOW (ref >59)
Globulin, Total: 3 g/dL (ref 1.5–4.5)
Glucose: 127 mg/dL — ABNORMAL HIGH (ref 65–99)
Potassium: 5.3 mmol/L — ABNORMAL HIGH (ref 3.5–5.2)
Sodium: 140 mmol/L (ref 134–144)
Total Protein: 7 g/dL (ref 6.0–8.5)

## 2015-08-04 LAB — LIPID PANEL
Chol/HDL Ratio: 2.6 ratio units (ref 0.0–5.0)
Cholesterol, Total: 117 mg/dL (ref 100–199)
HDL: 45 mg/dL (ref >39)
LDL Calculated: 39 mg/dL (ref 0–99)
Triglycerides: 163 mg/dL — ABNORMAL HIGH (ref 0–149)
VLDL Cholesterol Cal: 33 mg/dL (ref 5–40)

## 2015-08-04 MED ORDER — CLONAZEPAM 1 MG PO TABS: 1.0000 mg | ORAL_TABLET | Freq: Three times a day (TID) | ORAL | Status: DC | PRN

## 2015-08-04 NOTE — Progress Notes (Signed)
Subjective:    Patient ID: STOKES RATTIGAN, male    DOB: 1928/03/09, 82 y.o.   MRN: [id number]  Patient here today for follow up of chronic medical problems.  Outpatient Encounter Prescriptions as of 08/04/2015  Medication Sig  . aspirin 81 MG tablet   . atorvastatin (LIPITOR) 10 MG tablet Take 1 tablet (10 mg total) by mouth daily.  . clonazePAM (KLONOPIN) 1 MG tablet Take 1 tablet (1 mg total) by mouth 3 (three) times daily as needed.  . doxazosin (CARDURA) 2 MG tablet Take 1 tablet (2 mg total) by mouth daily.  . Doxylamine Succinate, Sleep, (SLEEP AID PO) Take 1 tablet by mouth at bedtime.  [name] lisinopril (PRINIVIL,ZESTRIL) 10 MG tablet Take 1 tablet (10 mg total) by mouth daily.  . Multiple Vitamins-Iron (MULTIVITAMINS WITH IRON) TABS tablet Take 1 tablet by mouth daily.  [name] Sodium (STOOL SOFTENER & LAXATIVE PO)    No facility-administered encounter medications on file as of 08/04/2015.     Hypertension This is a chronic problem. The problem is controlled. Pertinent negatives include no peripheral edema. Risk factors for coronary artery disease include male gender, dyslipidemia and family history. Past treatments include alpha 1 blockers. The current treatment provides moderate improvement. Hypertensive end-organ damage includes heart failure. There is no history of a thyroid problem.  Hyperlipidemia This is a chronic problem. The current episode started more than 1 year ago. The problem is controlled. Recent lipid tests were reviewed and are normal. Current antihyperlipidemic treatment includes statins. The current treatment provides significant improvement of lipids. Risk factors for coronary artery disease include dyslipidemia, family history and hypertension.  BPH Currently on cardura which helps with his flow- ocassionaly has problems with urine- but usually does well. COPD Does not do inhalers anymore- still smokes occasionaly ED Not currently in a relationship  right now so does not really matter GAD On klonopin which helps him from being so nervous and anxious CAD Sees cardiologist yearly- no c/o chest pain or palpitations CKD Stage III- currently just watching labs   Review of Systems  Constitutional: Negative.   HENT: Negative.   Genitourinary: Negative.   Neurological: Negative.   Psychiatric/Behavioral: Negative.   All other systems reviewed and are negative.      Objective:   Physical Exam  Constitutional: He is oriented to person, place, and time. He appears well-developed and well-nourished.  HENT:  Head: Normocephalic.  Right Ear: External ear normal.  Left Ear: External ear normal.  Multiple lower dental caries  Eyes: Pupils are equal, round, and reactive to light.  Neck: Normal range of motion. Neck supple. No thyromegaly present.  Cardiovascular: Normal rate, regular rhythm and intact distal pulses.   Murmur heard. Pulmonary/Chest: Effort normal and breath sounds normal.  Abdominal: Soft. Bowel sounds are normal. He exhibits no distension. There is no tenderness.  Musculoskeletal: Normal range of motion. He exhibits no edema.  Neurological: He is alert and oriented to person, place, and time.  Skin: Skin is warm and dry.  Psychiatric: He has a normal mood and affect. His behavior is normal. Judgment and thought content normal.   BP 147/65 mmHg  Pulse 78  Temp(Src) 97.9 F (36.6 C) (Oral)  Ht 5' 7" (1.702 m)  Wt 139 lb 3.2 oz (63.141 kg)  BMI 21.80 kg/m2      Assessment & Plan:  1. Essential hypertension Do not add salt to diet - CMP14+EGFR  2. Atherosclerosis of native coronary artery of native heart without  angina pectoris Keep follow with cardioloist  3. CKD (chronic kidney disease), stage III Labs pending  4. BPH (benign prostatic hyperplasia) Keep follow up with urology  5. Pulmonary emphysema, unspecified emphysema type ([place])  6. Hyperlipidemia with target LDL less than 100 Low fta diet -  Lipid panel  7. Anxiety state Stress management - clonazePAM (KLONOPIN) 1 MG tablet; Take 1 tablet (1 mg total) by mouth 3 (three) times daily as needed.  Dispense: 90 tablet; Refill: 2  8. ERECTILE DYSFUNCTION    Labs pending Health maintenance reviewed Diet and exercise encouraged Continue all meds Follow up  In 6 months   [place], [name]

## 2015-08-04 NOTE — Patient Instructions (Signed)
Fall Prevention in the Home  Falls can cause injuries and can affect people from all age groups. There are many simple things that you can do to make your home safe and to help prevent falls. WHAT CAN I DO ON THE OUTSIDE OF MY HOME?  Regularly repair the edges of walkways and driveways and fix any cracks.  Remove high doorway thresholds.  Trim any shrubbery on the main path into your home.  Use bright outdoor lighting.  Clear walkways of debris and clutter, including tools and rocks.  Regularly check that handrails are securely fastened and in good repair. Both sides of any steps should have handrails.  Install guardrails along the edges of any raised decks or porches.  Have leaves, snow, and ice cleared regularly.  Use sand or salt on walkways during winter months.  In the garage, clean up any spills right away, including grease or oil spills. WHAT CAN I DO IN THE BATHROOM?  Use night lights.  Install grab bars by the toilet and in the tub and shower. Do not use towel bars as grab bars.  Use non-skid mats or decals on the floor of the tub or shower.  If you need to sit down while you are in the shower, use a plastic, non-slip stool..  Keep the floor dry. Immediately clean up any water that spills on the floor.  Remove soap buildup in the tub or shower on a regular basis.  Attach bath mats securely with double-sided non-slip rug tape.  Remove throw rugs and other tripping hazards from the floor. WHAT CAN I DO IN THE BEDROOM?  Use night lights.  Make sure that a bedside light is easy to reach.  Do not use oversized bedding that drapes onto the floor.  Have a firm chair that has side arms to use for getting dressed.  Remove throw rugs and other tripping hazards from the floor. WHAT CAN I DO IN THE KITCHEN?   Clean up any spills right away.  Avoid walking on wet floors.  Place frequently used items in easy-to-reach places.  If you need to reach for something  above you, use a sturdy step stool that has a grab bar.  Keep electrical cables out of the way.  Do not use floor polish or wax that makes floors slippery. If you have to use wax, make sure that it is non-skid floor wax.  Remove throw rugs and other tripping hazards from the floor. WHAT CAN I DO IN THE STAIRWAYS?  Do not leave any items on the stairs.  Make sure that there are handrails on both sides of the stairs. Fix handrails that are broken or loose. Make sure that handrails are as long as the stairways.  Check any carpeting to make sure that it is firmly attached to the stairs. Fix any carpet that is loose or worn.  Avoid having throw rugs at the top or bottom of stairways, or secure the rugs with carpet tape to prevent them from moving.  Make sure that you have a light switch at the top of the stairs and the bottom of the stairs. If you do not have them, have them installed. WHAT ARE SOME OTHER FALL PREVENTION TIPS?  Wear closed-toe shoes that fit well and support your feet. Wear shoes that have rubber soles or low heels.  When you use a stepladder, make sure that it is completely opened and that the sides are firmly locked. Have someone hold the ladder while you   are using it. Do not climb a closed stepladder.  Add color or contrast paint or tape to grab bars and handrails in your home. Place contrasting color strips on the first and last steps.  Use mobility aids as needed, such as canes, walkers, scooters, and crutches.  Turn on lights if it is dark. Replace any light bulbs that burn out.  Set up furniture so that there are clear paths. Keep the furniture in the same spot.  Fix any uneven floor surfaces.  Choose a carpet design that does not hide the edge of steps of a stairway.  Be aware of any and all pets.  Review your medicines with your healthcare provider. Some medicines can cause dizziness or changes in blood pressure, which increase your risk of falling. Talk  with your health care provider about other ways that you can decrease your risk of falls. This may include working with a physical therapist or trainer to improve your strength, balance, and endurance.   This information is not intended to replace advice given to you by your health care provider. Make sure you discuss any questions you have with your health care provider.   Document Released: 01/12/2002 Document Revised: 06/08/2014 Document Reviewed: 02/26/2014 Elsevier Interactive Patient Education 2016 Elsevier Inc.  

## 2015-09-02 ENCOUNTER — Ambulatory Visit: Payer: Commercial Managed Care - HMO | Admitting: Urology

## 2015-10-03 ENCOUNTER — Other Ambulatory Visit: Payer: Self-pay | Admitting: Nurse Practitioner

## 2015-10-03 DIAGNOSIS — F411 Generalized anxiety disorder: Secondary | ICD-10-CM

## 2015-10-04 NOTE — Telephone Encounter (Signed)
Prescription was called in to [facility]

## 2015-10-04 NOTE — Telephone Encounter (Signed)
Please call in klonopin with 1 refills 

## 2015-10-12 ENCOUNTER — Other Ambulatory Visit: Payer: Self-pay | Admitting: Nurse Practitioner

## 2015-10-12 DIAGNOSIS — I10 Essential (primary) hypertension: Secondary | ICD-10-CM

## 2016-01-02 ENCOUNTER — Other Ambulatory Visit: Payer: Self-pay | Admitting: Nurse Practitioner

## 2016-01-02 DIAGNOSIS — F411 Generalized anxiety disorder: Secondary | ICD-10-CM

## 2016-01-03 ENCOUNTER — Other Ambulatory Visit: Payer: Self-pay | Admitting: Nurse Practitioner

## 2016-01-03 DIAGNOSIS — F411 Generalized anxiety disorder: Secondary | ICD-10-CM

## 2016-01-03 NOTE — Telephone Encounter (Signed)
Please call in clonazepam with 1 refills 

## 2016-01-04 NOTE — Telephone Encounter (Signed)
Please call in klonopin with 1 refills 

## 2016-01-25 ENCOUNTER — Other Ambulatory Visit: Payer: Self-pay | Admitting: Nurse Practitioner

## 2016-01-25 DIAGNOSIS — I2510 Atherosclerotic heart disease of native coronary artery without angina pectoris: Secondary | ICD-10-CM

## 2016-01-25 DIAGNOSIS — E785 Hyperlipidemia, unspecified: Secondary | ICD-10-CM

## 2016-01-31 NOTE — Telephone Encounter (Signed)
Last refill without being seen 

## 2016-02-03 ENCOUNTER — Ambulatory Visit (INDEPENDENT_AMBULATORY_CARE_PROVIDER_SITE_OTHER): Payer: Commercial Managed Care - HMO | Admitting: Nurse Practitioner

## 2016-02-03 ENCOUNTER — Encounter: Payer: Self-pay | Admitting: Nurse Practitioner

## 2016-02-03 ENCOUNTER — Ambulatory Visit (INDEPENDENT_AMBULATORY_CARE_PROVIDER_SITE_OTHER): Payer: Commercial Managed Care - HMO

## 2016-02-03 VITALS — BP 89/57 | HR 83 | Temp 96.8°F | Ht 67.0 in | Wt 138.0 lb

## 2016-02-03 DIAGNOSIS — C3490 Malignant neoplasm of unspecified part of unspecified bronchus or lung: Secondary | ICD-10-CM | POA: Diagnosis not present

## 2016-02-03 DIAGNOSIS — F411 Generalized anxiety disorder: Secondary | ICD-10-CM | POA: Diagnosis not present

## 2016-02-03 DIAGNOSIS — N400 Enlarged prostate without lower urinary tract symptoms: Secondary | ICD-10-CM | POA: Diagnosis not present

## 2016-02-03 DIAGNOSIS — N183 Chronic kidney disease, stage 3 unspecified: Secondary | ICD-10-CM

## 2016-02-03 DIAGNOSIS — I10 Essential (primary) hypertension: Secondary | ICD-10-CM | POA: Diagnosis not present

## 2016-02-03 DIAGNOSIS — E785 Hyperlipidemia, unspecified: Secondary | ICD-10-CM

## 2016-02-03 DIAGNOSIS — I2510 Atherosclerotic heart disease of native coronary artery without angina pectoris: Secondary | ICD-10-CM

## 2016-02-03 DIAGNOSIS — J439 Emphysema, unspecified: Secondary | ICD-10-CM | POA: Diagnosis not present

## 2016-02-03 LAB — LIPID PANEL
Chol/HDL Ratio: 3 ratio units (ref 0.0–5.0)
Cholesterol, Total: 140 mg/dL (ref 100–199)
HDL: 47 mg/dL (ref >39)
LDL Calculated: 71 mg/dL (ref 0–99)
Triglycerides: 112 mg/dL (ref 0–149)
VLDL Cholesterol Cal: 22 mg/dL (ref 5–40)

## 2016-02-03 LAB — CMP14+EGFR
ALT: 12 IU/L (ref 0–44)
AST: 17 IU/L (ref 0–40)
Albumin/Globulin Ratio: 1.2 (ref 1.2–2.2)
Albumin: 4 g/dL (ref 3.5–4.7)
Alkaline Phosphatase: 75 IU/L (ref 39–117)
BUN/Creatinine Ratio: 14 (ref 10–24)
BUN: 21 mg/dL (ref 8–27)
Bilirubin Total: 0.2 mg/dL (ref 0.0–1.2)
CO2: 26 mmol/L (ref 18–29)
Calcium: 9.7 mg/dL (ref 8.6–10.2)
Chloride: 97 mmol/L (ref 96–106)
Creatinine, Ser: 1.45 mg/dL — ABNORMAL HIGH (ref 0.76–1.27)
GFR calc Af Amer: 50 mL/min/{1.73_m2} — ABNORMAL LOW (ref >59)
GFR calc non Af Amer: 44 mL/min/{1.73_m2} — ABNORMAL LOW (ref >59)
Globulin, Total: 3.3 g/dL (ref 1.5–4.5)
Glucose: 93 mg/dL (ref 65–99)
Potassium: 5.1 mmol/L (ref 3.5–5.2)
Sodium: 138 mmol/L (ref 134–144)
Total Protein: 7.3 g/dL (ref 6.0–8.5)

## 2016-02-03 MED ORDER — DOXAZOSIN MESYLATE 2 MG PO TABS: 2.0000 mg | ORAL_TABLET | Freq: Every day | ORAL | 1 refills | Status: DC

## 2016-02-03 MED ORDER — LISINOPRIL 10 MG PO TABS: 10.0000 mg | ORAL_TABLET | Freq: Every day | ORAL | 1 refills | Status: DC

## 2016-02-03 MED ORDER — ATORVASTATIN CALCIUM 10 MG PO TABS: 10.0000 mg | ORAL_TABLET | Freq: Every day | ORAL | 1 refills | Status: DC

## 2016-02-03 MED ORDER — CLONAZEPAM 1 MG PO TABS: 1.0000 mg | ORAL_TABLET | Freq: Three times a day (TID) | ORAL | 1 refills | Status: DC | PRN

## 2016-02-03 NOTE — Progress Notes (Signed)
   Subjective:    Patient ID: Steven [name], male    DOB: 1928-12-27, 82 y.o.   MRN: [id number]  HPI    Review of Systems     Objective:   Physical Exam        Assessment & Plan:

## 2016-02-03 NOTE — Progress Notes (Signed)
Subjective:    Patient ID: Steven [name], male    DOB: September 12, 1928, 82 y.o.   MRN: [id number]  Patient here today for follow up of chronic medical problems. Patient says there have been no changes since last visit. NO complaints today.  Outpatient Encounter Prescriptions as of 02/03/2016  Medication Sig  . aspirin 81 MG tablet   . clonazePAM (KLONOPIN) 1 MG tablet TAKE 1 TABLET BY MOUTH 3 TIMES A DAY AS NEEDED  . doxazosin (CARDURA) 2 MG tablet Take 1 tablet (2 mg total) by mouth daily.  . Doxylamine Succinate, Sleep, (SLEEP AID PO) Take 1 tablet by mouth at bedtime.  [name] lisinopril (PRINIVIL,ZESTRIL) 10 MG tablet Take 1 tablet (10 mg total) by mouth daily.  [name] Sodium (STOOL SOFTENER & LAXATIVE PO)   . atorvastatin (LIPITOR) 10 MG tablet Take 1 tablet (10 mg total) by mouth daily. (Patient not taking: Reported on 02/03/2016)  . Multiple Vitamins-Iron (MULTIVITAMINS WITH IRON) TABS tablet Take 1 tablet by mouth daily.    Hypertension  This is a chronic problem. The problem is controlled. Pertinent negatives include no peripheral edema. Risk factors for coronary artery disease include male gender, dyslipidemia and family history. Past treatments include alpha 1 blockers. The current treatment provides moderate improvement. Hypertensive end-organ damage includes heart failure. There is no history of a thyroid problem.  Hyperlipidemia  This is a chronic problem. The current episode started more than 1 year ago. The problem is controlled. Recent lipid tests were reviewed and are normal. Current antihyperlipidemic treatment includes statins. The current treatment provides significant improvement of lipids. Risk factors for coronary artery disease include dyslipidemia, family history and hypertension.  BPH Currently on cardura which helps with his flow- ocassionaly has problems with urine- but usually does well. COPD/ Squamous cell carcinoma of lung Does not do inhalers anymore-  still smokes occasionally. Never had any treatments for lung cancer.He just had lobeectomy. Patient said he just quit going to oncologist. ED Not currently in a relationship right now so does not really matter GAD On klonopin which helps him from being so nervous and anxious CAD/Coronary atherosclrosis Sees cardiologist yearly- no c/o chest pain or palpitations CKD Stage III- currently just watching labs   Review of Systems  Constitutional: Negative.   HENT: Negative.   Genitourinary: Negative.   Neurological: Negative.   Psychiatric/Behavioral: Negative.   All other systems reviewed and are negative.      Objective:   Physical Exam  Constitutional: He is oriented to person, place, and time. He appears well-developed and well-nourished.  HENT:  Head: Normocephalic.  Right Ear: External ear normal.  Left Ear: External ear normal.  Multiple lower dental caries  Eyes: Pupils are equal, round, and reactive to light.  Neck: Normal range of motion. Neck supple. No thyromegaly present.  Cardiovascular: Normal rate, regular rhythm and intact distal pulses.   Murmur heard. Pulmonary/Chest: Effort normal and breath sounds normal.  Abdominal: Soft. Bowel sounds are normal. He exhibits no distension. There is no tenderness.  Musculoskeletal: Normal range of motion. He exhibits no edema.  Neurological: He is alert and oriented to person, place, and time.  Skin: Skin is warm and dry.  Psychiatric: He has a normal mood and affect. His behavior is normal. Judgment and thought content normal.   BP (!) 89/57   Pulse 83   Temp (!) 96.8 F (36 C) (Oral)   Ht 5' 7"  (1.702 m)   Wt 138 lb (62.6 kg)  BMI 21.61 kg/m   Chest x ray - pending radiology reading- Preliminary reading by Ronnald Collum, [name]  [facility]     Assessment & Plan:  1. Essential hypertension Low sodium diet - lisinopril (PRINIVIL,ZESTRIL) 10 MG tablet; Take 1 tablet (10 mg total) by mouth daily.  Dispense: 90 tablet; Refill:  1 - CMP14+EGFR  2. Atherosclerosis of native coronary artery of native heart without angina pectoris - doxazosin (CARDURA) 2 MG tablet; Take 1 tablet (2 mg total) by mouth daily.  Dispense: 90 tablet; Refill: 1  3. Pulmonary emphysema, unspecified emphysema type ([place])  4. Squamous cell carcinoma of lung, unspecified laterality ([place]) - DG Chest 2 View; Future  5. Benign prostatic hyperplasia without lower urinary tract symptoms  6. CKD (chronic kidney disease), stage III  7. Anxiety state Stress management - clonazePAM (KLONOPIN) 1 MG tablet; Take 1 tablet (1 mg total) by mouth 3 (three) times daily as needed.  Dispense: 90 tablet; Refill: 1  8. Hyperlipidemia with target LDL less than 100 Low fat diet - atorvastatin (LIPITOR) 10 MG tablet; Take 1 tablet (10 mg total) by mouth daily.  Dispense: 90 tablet; Refill: 1 - Lipid panel    Labs pending Health maintenance reviewed Diet and exercise encouraged Continue all meds Follow up  In 3 months   [place], [name]

## 2016-02-03 NOTE — Patient Instructions (Signed)
Fall Prevention in Hospitals, Adult WHAT ARE SOME SAFETY TIPS FOR PREVENTING FALLS? If you or a loved one has to stay in the hospital, talk with the health care providers about the risk of falling. Find out which medicines or treatments can cause dizziness or affect balance. Make a plan with the health care providers to prevent falls. The plan may include these points:  Ask for help moving around, especially after surgery or when feeling unwell.  Have support available when getting up and moving around.  Wear nonskid footwear.  Use the safety straps on wheelchairs.  Ask for help to get objects that are out of reach.  Wear eyeglasses.  Remove all clutter from the floor and the sides of the bed.  Keep equipment and wires securely out of the way.  Keep the bed locked in the low position.  Keep the side rails up on the bed.  Keep the nurse call button within reach.  Keep the door open when no one else is in the room.  Have someone stay in the hospital with you or your loved one.  Ask for a bed alarm if you are not able to stay with your loved one who is at risk for getting up without help.  Ask if sleeping pills or other medicines that alter mental status are necessary. WHAT INCREASES THE RISK FOR FALLS? Certain conditions and treatments may increase a patient's risk for falls in a hospital. These include:  Being in an unfamiliar environment.  Being on bed rest.  Having surgery.  Taking certain medicines, such as sleeping pills.  Having tubes in place, such as IV lines or catheters. Additional risk factors for falls in a hospital include:  Having vision problems.  Having a change in thinking, feeling, or behavior (altered mental status).  Having trouble with balance.  Needing to use the toilet frequently.  Having fallen in the past three months.  Having low blood pressure when standing up quickly (orthostatic hypotension). WHAT DOES THE HOSPITAL STAFF DO TO HELP  PREVENT ME OR MY LOVED ONE FROM FALLING? Hospitals have systems in place to prevent falls and accidents. Talk with the hospital staff about:  Doing an assessment to discuss fall risks and create a personalized plan to prevent falls.  Checking in regularly to see if help is needed for moving around and to assess any changes in fall risk.  Knowing where the nurse call button is and how to use it. Use this to call a nursing care provider any time.  Keeping personal items within reach. This includes eyeglasses, phones, and other electronic devices.  Following general safety guidelines when moving around.  Keeping the area around the bed free from clutter.  Removing unnecessary equipment or tubes to reduce the risk of tripping.  Using safety equipment, such as:  Walkers, crutches, and other walking devices for support.  Safety rails on the bed.  Safety straps in the bed.  A bed that can be lowered and locked to prevent movement.  Handrails in the bathroom.  Nonskid socks and shoes.  Locking mechanisms to secure equipment in place.  Lifting and transfer equipment. WHAT CAN I DO TO HELP PREVENT A FALL?  Talk with health care providers about fall prevention.  Have a personalized fall prevention plan in place.  Do not try to move around if you feel off balance or ill.  Change position slowly.  Sit on the side of the bed before standing up.  Sit down and call   for help if you feel dizzy or unsteady when standing.  Keep the hospital room clear of clutter. WHEN SHOULD I ASK FOR HELP? Ask for help whenever you:  Are not sure if you are able to move around safely.  Feel dizzy or unsteady.  Are not comfortable helping your loved one move around or use the bathroom. If you or a loved one falls, tell the hospital staff. This is important. This information is not intended to replace advice given to you by your health care provider. Make sure you discuss any questions you have  with your health care provider. Document Released: 01/20/2000 Document Revised: 06/21/2015 Document Reviewed: 11/04/2014 Elsevier Interactive Patient Education  2017 Elsevier Inc.  

## 2016-03-15 ENCOUNTER — Observation Stay (HOSPITAL_COMMUNITY): Payer: Medicare HMO

## 2016-03-15 ENCOUNTER — Encounter (INDEPENDENT_AMBULATORY_CARE_PROVIDER_SITE_OTHER): Payer: Self-pay

## 2016-03-15 ENCOUNTER — Encounter: Payer: Self-pay | Admitting: Family Medicine

## 2016-03-15 ENCOUNTER — Encounter (HOSPITAL_COMMUNITY): Payer: Self-pay | Admitting: *Deleted

## 2016-03-15 ENCOUNTER — Emergency Department (HOSPITAL_COMMUNITY): Payer: Medicare HMO

## 2016-03-15 ENCOUNTER — Ambulatory Visit (INDEPENDENT_AMBULATORY_CARE_PROVIDER_SITE_OTHER): Payer: Commercial Managed Care - HMO | Admitting: Family Medicine

## 2016-03-15 ENCOUNTER — Inpatient Hospital Stay (HOSPITAL_COMMUNITY)
Admission: EM | Admit: 2016-03-15 | Discharge: 2016-03-18 | DRG: 194 | Disposition: A | Discharge status: Home Health | Payer: Medicare HMO | Attending: Family Medicine | Admitting: Family Medicine

## 2016-03-15 VITALS — BP 94/43 | HR 75 | Temp 99.9°F

## 2016-03-15 DIAGNOSIS — F411 Generalized anxiety disorder: Secondary | ICD-10-CM | POA: Diagnosis not present

## 2016-03-15 DIAGNOSIS — Z951 Presence of aortocoronary bypass graft: Secondary | ICD-10-CM

## 2016-03-15 DIAGNOSIS — Z79899 Other long term (current) drug therapy: Secondary | ICD-10-CM

## 2016-03-15 DIAGNOSIS — Z85118 Personal history of other malignant neoplasm of bronchus and lung: Secondary | ICD-10-CM | POA: Diagnosis not present

## 2016-03-15 DIAGNOSIS — J189 Pneumonia, unspecified organism: Secondary | ICD-10-CM | POA: Diagnosis not present

## 2016-03-15 DIAGNOSIS — J111 Influenza due to unidentified influenza virus with other respiratory manifestations: Secondary | ICD-10-CM

## 2016-03-15 DIAGNOSIS — E785 Hyperlipidemia, unspecified: Secondary | ICD-10-CM | POA: Diagnosis not present

## 2016-03-15 DIAGNOSIS — N183 Chronic kidney disease, stage 3 unspecified: Secondary | ICD-10-CM | POA: Diagnosis present

## 2016-03-15 DIAGNOSIS — E869 Volume depletion, unspecified: Secondary | ICD-10-CM | POA: Diagnosis not present

## 2016-03-15 DIAGNOSIS — I129 Hypertensive chronic kidney disease with stage 1 through stage 4 chronic kidney disease, or unspecified chronic kidney disease: Secondary | ICD-10-CM | POA: Diagnosis present

## 2016-03-15 DIAGNOSIS — J101 Influenza due to other identified influenza virus with other respiratory manifestations: Secondary | ICD-10-CM | POA: Diagnosis not present

## 2016-03-15 DIAGNOSIS — I252 Old myocardial infarction: Secondary | ICD-10-CM

## 2016-03-15 DIAGNOSIS — J439 Emphysema, unspecified: Secondary | ICD-10-CM

## 2016-03-15 DIAGNOSIS — I10 Essential (primary) hypertension: Secondary | ICD-10-CM | POA: Diagnosis present

## 2016-03-15 DIAGNOSIS — R05 Cough: Secondary | ICD-10-CM | POA: Diagnosis not present

## 2016-03-15 DIAGNOSIS — E875 Hyperkalemia: Secondary | ICD-10-CM | POA: Diagnosis not present

## 2016-03-15 DIAGNOSIS — R6883 Chills (without fever): Secondary | ICD-10-CM | POA: Diagnosis not present

## 2016-03-15 DIAGNOSIS — N179 Acute kidney failure, unspecified: Secondary | ICD-10-CM

## 2016-03-15 DIAGNOSIS — M6281 Muscle weakness (generalized): Secondary | ICD-10-CM

## 2016-03-15 DIAGNOSIS — Z87891 Personal history of nicotine dependence: Secondary | ICD-10-CM

## 2016-03-15 DIAGNOSIS — R531 Weakness: Secondary | ICD-10-CM | POA: Diagnosis not present

## 2016-03-15 LAB — VERITOR FLU A/B WAIVED
Influenza A: POSITIVE — AB
Influenza B: NEGATIVE

## 2016-03-15 LAB — COMPREHENSIVE METABOLIC PANEL
ALT: 19 U/L (ref 17–63)
AST: 29 U/L (ref 15–41)
Albumin: 3.5 g/dL (ref 3.5–5.0)
Alkaline Phosphatase: 49 U/L (ref 38–126)
Anion gap: 8 (ref 5–15)
BUN: 45 mg/dL — ABNORMAL HIGH (ref 6–20)
CO2: 24 mmol/L (ref 22–32)
Calcium: 8.8 mg/dL — ABNORMAL LOW (ref 8.9–10.3)
Chloride: 104 mmol/L (ref 101–111)
Creatinine, Ser: 2.74 mg/dL — ABNORMAL HIGH (ref 0.61–1.24)
GFR calc Af Amer: 23 mL/min — ABNORMAL LOW (ref >60)
GFR calc non Af Amer: 20 mL/min — ABNORMAL LOW (ref >60)
Glucose, Bld: 118 mg/dL — ABNORMAL HIGH (ref 65–99)
Potassium: 4.5 mmol/L (ref 3.5–5.1)
Sodium: 136 mmol/L (ref 135–145)
Total Bilirubin: 0.7 mg/dL (ref 0.3–1.2)
Total Protein: 6.9 g/dL (ref 6.5–8.1)

## 2016-03-15 LAB — I-STAT CG4 LACTIC ACID, ED: Lactic Acid, Venous: 1.56 mmol/L (ref 0.5–1.9)

## 2016-03-15 LAB — CBC WITH DIFFERENTIAL/PLATELET
Basophils Absolute: 0 10*3/uL (ref 0.0–0.1)
Basophils Relative: 1 %
Eosinophils Absolute: 0.1 10*3/uL (ref 0.0–0.7)
Eosinophils Relative: 1 %
HCT: 31.9 % — ABNORMAL LOW (ref 39.0–52.0)
Hemoglobin: 10.2 g/dL — ABNORMAL LOW (ref 13.0–17.0)
Lymphocytes Relative: 7 %
Lymphs Abs: 0.5 10*3/uL — ABNORMAL LOW (ref 0.7–4.0)
MCH: 30.5 pg (ref 26.0–34.0)
MCHC: 32 g/dL (ref 30.0–36.0)
MCV: 95.5 fL (ref 78.0–100.0)
Monocytes Absolute: 0.5 10*3/uL (ref 0.1–1.0)
Monocytes Relative: 7 %
Neutro Abs: 5.3 10*3/uL (ref 1.7–7.7)
Neutrophils Relative %: 84 %
Platelets: 106 10*3/uL — ABNORMAL LOW (ref 150–400)
RBC: 3.34 MIL/uL — ABNORMAL LOW (ref 4.22–5.81)
RDW: 14.4 % (ref 11.5–15.5)
WBC: 6.3 10*3/uL (ref 4.0–10.5)

## 2016-03-15 LAB — AMMONIA: Ammonia: 26 umol/L (ref 9–35)

## 2016-03-15 LAB — CBG MONITORING, ED: Glucose-Capillary: 117 mg/dL — ABNORMAL HIGH (ref 65–99)

## 2016-03-15 MED ORDER — PREDNISONE 20 MG PO TABS
40.0000 mg | ORAL_TABLET | Freq: Every day | ORAL | Status: DC
  Administered 2016-03-16 – 2016-03-18 (×3): 40 mg via ORAL
  Filled 2016-03-15 (×3): qty 2

## 2016-03-15 MED ORDER — DOCUSATE SODIUM 100 MG PO CAPS
100.0000 mg | ORAL_CAPSULE | Freq: Two times a day (BID) | ORAL | Status: DC
  Administered 2016-03-16 – 2016-03-18 (×5): 100 mg via ORAL
  Filled 2016-03-15 (×5): qty 1

## 2016-03-15 MED ORDER — OSELTAMIVIR PHOSPHATE 30 MG PO CAPS
30.0000 mg | ORAL_CAPSULE | Freq: Every day | ORAL | Status: DC
  Administered 2016-03-16 – 2016-03-17 (×2): 30 mg via ORAL
  Filled 2016-03-15 (×3): qty 1

## 2016-03-15 MED ORDER — SODIUM CHLORIDE 0.9 % IV SOLN
INTRAVENOUS | Status: DC
  Administered 2016-03-15 – 2016-03-16 (×2): via INTRAVENOUS

## 2016-03-15 MED ORDER — ENOXAPARIN SODIUM 30 MG/0.3ML ~~LOC~~ SOLN
30.0000 mg | SUBCUTANEOUS | Status: DC
  Administered 2016-03-16 – 2016-03-17 (×3): 30 mg via SUBCUTANEOUS
  Filled 2016-03-15 (×3): qty 0.3

## 2016-03-15 MED ORDER — CEFTRIAXONE SODIUM 1 G IJ SOLR
1.0000 g | Freq: Once | INTRAMUSCULAR | Status: AC
  Administered 2016-03-15: 1 g via INTRAVENOUS
  Filled 2016-03-15: qty 10

## 2016-03-15 MED ORDER — ONDANSETRON HCL 4 MG/2ML IJ SOLN: 4.0000 mg | Freq: Four times a day (QID) | INTRAMUSCULAR | Status: DC | PRN

## 2016-03-15 MED ORDER — SENNOSIDES-DOCUSATE SODIUM 8.6-50 MG PO TABS
1.0000 | ORAL_TABLET | Freq: Every evening | ORAL | Status: DC | PRN
Start: 2016-03-15 — End: 2016-03-18

## 2016-03-15 MED ORDER — ASPIRIN 81 MG PO CHEW
81.0000 mg | CHEWABLE_TABLET | Freq: Every day | ORAL | Status: DC
  Administered 2016-03-16 – 2016-03-18 (×3): 81 mg via ORAL
  Filled 2016-03-15 (×3): qty 1

## 2016-03-15 MED ORDER — LISINOPRIL 10 MG PO TABS
10.0000 mg | ORAL_TABLET | Freq: Every day | ORAL | Status: DC
  Administered 2016-03-16 – 2016-03-18 (×3): 10 mg via ORAL
  Filled 2016-03-15 (×3): qty 1

## 2016-03-15 MED ORDER — DEXTROSE 5 % IV SOLN
500.0000 mg | Freq: Once | INTRAVENOUS | Status: AC
  Administered 2016-03-15: 500 mg via INTRAVENOUS
  Filled 2016-03-15: qty 500

## 2016-03-15 MED ORDER — ACETAMINOPHEN 650 MG RE SUPP: 650.0000 mg | Freq: Four times a day (QID) | RECTAL | Status: DC | PRN

## 2016-03-15 MED ORDER — ACETAMINOPHEN 325 MG PO TABS
650.0000 mg | ORAL_TABLET | Freq: Four times a day (QID) | ORAL | Status: DC | PRN
  Administered 2016-03-16: 650 mg via ORAL
  Filled 2016-03-15: qty 2

## 2016-03-15 MED ORDER — DOXAZOSIN MESYLATE 2 MG PO TABS
2.0000 mg | ORAL_TABLET | Freq: Every day | ORAL | Status: DC
  Administered 2016-03-16 – 2016-03-18 (×3): 2 mg via ORAL
  Filled 2016-03-15 (×3): qty 1

## 2016-03-15 MED ORDER — SODIUM CHLORIDE 0.9 % IV BOLUS (SEPSIS)
500.0000 mL | Freq: Once | INTRAVENOUS | Status: AC
  Administered 2016-03-15: 500 mL via INTRAVENOUS

## 2016-03-15 MED ORDER — ONDANSETRON HCL 4 MG PO TABS: 4.0000 mg | ORAL_TABLET | Freq: Four times a day (QID) | ORAL | Status: DC | PRN

## 2016-03-15 MED ORDER — CLONAZEPAM 0.5 MG PO TABS
1.0000 mg | ORAL_TABLET | Freq: Three times a day (TID) | ORAL | Status: DC | PRN
  Administered 2016-03-16 – 2016-03-17 (×2): 1 mg via ORAL
  Filled 2016-03-15 (×2): qty 2

## 2016-03-15 NOTE — ED Notes (Signed)
Attempted report x1. 

## 2016-03-15 NOTE — ED Provider Notes (Addendum)
[facility] DEPT Provider Note   CSN: [id number] Arrival date & time: 03/15/16  1056   By signing my name below, I, Collene Leyden, attest that this documentation has been prepared under the direction and in the presence of Davonna Belling, MD. Electronically Signed: Collene Leyden, Scribe. 03/15/16. 11:31 AM.  History   Chief Complaint Chief Complaint  Patient presents with  . Weakness    HPI Comments: Steven [name] is a 83 y.o. male with a hx of lung cancer, HLD, HTN, MI, who presents to the Emergency Department complaining of gradually worsening weakness that began a few days ago. Patient went to see his PCP this morning, in which he was diagnosed with the flu and pneumonia, was referred to the ED. Patient has associated non-productive cough, bilateral leg pain. No modifying factors indicated. Patient denies any nausea, vomiting, diarrhea, sore throat, fever, chest pain.   The history is provided by the patient. No language interpreter was used.    Past Medical History:  Diagnosis Date  . Anxiety   . Cancer [facility]) 2013   Lung - cancer free x3 years.   . Hyperlipidemia   . Hypertension   . Myocardial infarction 1999    Patient Active Problem List   Diagnosis Date Noted  . Abdominal bruit 10/15/2014  . Transaminitis 09/28/2014  . Nicotine addiction 09/28/2014  . CKD (chronic kidney disease), stage III 09/28/2014  . BPH (benign prostatic hyperplasia) 02/25/2013  . Squamous cell carcinoma of lung ([place]) 11/13/2011  . COPD with emphysema ([place]) 07/29/2007  . Hyperlipidemia with target LDL less than 100 07/28/2007  . Anxiety state 07/28/2007  . ERECTILE DYSFUNCTION 07/28/2007  . Essential hypertension 07/28/2007  . Coronary atherosclerosis 07/28/2007    Past Surgical History:  Procedure Laterality Date  . CARPAL TUNNEL RELEASE  1980's   right and left  . CHOLECYSTECTOMY    . COLONOSCOPY N/A 09/29/2014   Procedure: COLONOSCOPY;  Surgeon: Rogene Houston, MD;  [facility]  [facility];  Service: Endoscopy;  Laterality: N/A;  . CORONARY ARTERY BYPASS GRAFT  1999   5 grafts  . FLEXIBLE BRONCHOSCOPY  11/07/2011   Procedure: FLEXIBLE BRONCHOSCOPY;  Surgeon: Gaye Pollack, MD;  Location: [facility];  Service: Thoracic;  Laterality: N/A;  . HERNIA REPAIR  6314   umbilical hernia repair  . JOINT REPLACEMENT  2000   right shoulder rotator cuff repair       Home Medications    Prior to Admission medications   Medication Sig Start Date End Date Taking? Authorizing Provider  aspirin 81 MG tablet Take 81 mg by mouth daily.  07/19/15  Yes Historical Provider, MD  clonazePAM (KLONOPIN) 1 MG tablet Take 1 tablet (1 mg total) by mouth 3 (three) times daily as needed. 02/03/16  Yes Mary-Margaret Hassell Done, [name]  doxazosin (CARDURA) 2 MG tablet Take 1 tablet (2 mg total) by mouth daily. 02/03/16  Yes Mary-Margaret Hassell Done, [name]  Doxylamine Succinate, Sleep, (SLEEP AID PO) Take 1 tablet by mouth at bedtime.   Yes Historical Provider, MD  lisinopril (PRINIVIL,ZESTRIL) 10 MG tablet Take 1 tablet (10 mg total) by mouth daily. 02/03/16  Yes Mary-Margaret Hassell Done, [name]  Multiple Vitamins-Iron (MULTIVITAMINS WITH IRON) TABS tablet Take 1 tablet by mouth daily.   Yes Historical Provider, MD  Sennosides-Docusate Sodium (STOOL SOFTENER & LAXATIVE PO)  07/19/15  Yes Historical Provider, MD    Family History Family History  Problem Relation Age of Onset  . Cancer Mother   . Pulmonary embolism Father  Social History Social History  Substance Use Topics  . Smoking status: Former Smoker    Packs/day: 2.00    Years: 73.00    Types: Cigarettes    Start date: 02/05/2006    Quit date: 11/05/2006  . Smokeless tobacco: Never Used  . Alcohol use No     Allergies   Patient has no known allergies.   Review of Systems Review of Systems  Constitutional: Negative for fever.  HENT: Negative for sore throat.   Respiratory: Positive for cough.   Gastrointestinal: Negative for abdominal  pain, diarrhea and nausea.  Musculoskeletal: Positive for myalgias.     Physical Exam Updated Vital Signs BP (!) 102/30 Comment: repeat BP  Pulse 77   Temp 99.3 F (37.4 C) (Rectal)   Resp 18   Ht '5\' 7"'$  (1.702 m)   Wt 140 lb (63.5 kg)   SpO2 98%   BMI 21.93 kg/m   Physical Exam  Constitutional: He is oriented to person, place, and time. He appears well-developed.  Cardiovascular: Normal rate.   Pulmonary/Chest: Effort normal.  Prolonged expiration, mild wheezes bilaterally. Rales in the left mid lung field.   Abdominal: Soft. There is no tenderness.  No peripheral edema.   Neurological: He is alert and oriented to person, place, and time.  Skin: Skin is warm and dry.     ED Treatments / Results  DIAGNOSTIC STUDIES: Oxygen Saturation is 98% on RA, normal by my interpretation.    COORDINATION OF CARE: 11:31 AM Discussed treatment plan with pt at bedside and pt agreed to plan.  Labs (all labs ordered are listed, but only abnormal results are displayed) Labs Reviewed  COMPREHENSIVE METABOLIC PANEL - Abnormal; Notable for the following:       Result Value   Glucose, Bld 118 (*)    BUN 45 (*)    Creatinine, Ser 2.74 (*)    Calcium 8.8 (*)    GFR calc non Af Amer 20 (*)    GFR calc Af Amer 23 (*)    All other components within normal limits  CBC WITH DIFFERENTIAL/PLATELET - Abnormal; Notable for the following:    RBC 3.34 (*)    Hemoglobin 10.2 (*)    HCT 31.9 (*)    Platelets 106 (*)    Lymphs Abs 0.5 (*)    All other components within normal limits  I-STAT CG4 LACTIC ACID, ED    EKG  EKG Interpretation  Date/Time:  Thursday March 15 2016 11:29:12 EST Ventricular Rate:  85 PR Interval:    QRS Duration: 102 QT Interval:  324 QTC Calculation: 386 R Axis:   81 Text Interpretation:  Sinus rhythm Borderline right axis deviation Borderline repolarization abnormality Baseline wander in lead(s) V2 V3 V5 V6 Confirmed by Alvino Chapel  MD, Lili Harts [phone]) on  03/15/2016 11:32:32 AM       Radiology Dg Chest 2 View  Result Date: 03/15/2016 CLINICAL DATA:  Cough, fever, leg aching. History of lung malignancy, former smoker. EXAM: CHEST  2 VIEW COMPARISON:  PA and lateral chest x-ray dated February 03, 2016. FINDINGS: The lungs remain hyperinflated with hemidiaphragm flattening. There is no focal infiltrate. There is no pleural effusion. No pulmonary parenchymal masses are observed. The heart and pulmonary vascularity are normal. There are post CABG changes. There is calcification in the wall of the aortic arch. The bony thorax exhibits no acute abnormality. IMPRESSION: COPD.  Previous CABG.  No pneumonia nor CHF. Thoracic aortic atherosclerosis. Electronically Signed   By: Shanon Brow  [place] M.D.   On: 03/15/2016 12:00    Procedures Procedures (including critical care time)  Medications Ordered in ED Medications  sodium chloride 0.9 % bolus 500 mL (500 mLs Intravenous New Bag/Given 03/15/16 1237)     Initial Impression / Assessment and Plan / ED Course  I have reviewed the triage vital signs and the nursing notes.  Pertinent labs & imaging results that were available during my care of the patient were reviewed by me and considered in my medical decision making (see chart for details).     Patient percent in from primary care doctor after being diagnosed with flu or pneumonia. Has had generalized weakness. Has had a somewhat productive cough. Positive flu test at the primary's office. X-ray reassuring here but does have localizing lung findings on exam. Will treat as pneumonia. Does have some mild hypotension which patient reportedly has had before per the note from the primary care doctor. Lactic acid is normal. We'll give fluid boluses. Reported history of COPD. Creatinine has now increased to 2.7 baseline appears to be around 1.4.. Admit to internal medicine. .  Final C linical Impressions(s) / ED Diagnoses   Final diagnoses:  Influenza  Community  acquired pneumonia, unspecified laterality  AKI (acute kidney injury) ([place])    New Prescriptions New Prescriptions   No medications on file   I personally performed the services described in this documentation, which was scribed in my presence. The recorded information has been reviewed and is accurate.       Davonna Belling, MD 03/15/16 1256    Davonna Belling, MD 03/15/16 [phone]

## 2016-03-15 NOTE — ED Notes (Signed)
Pt was at edge of bed about to stand up, pt helped back into bed, bed alarm placed under pt and side rails are up at this time.

## 2016-03-15 NOTE — ED Notes (Signed)
Dr. Adair Patter paged by secretary to discuss symptoms.

## 2016-03-15 NOTE — ED Notes (Signed)
Pt is alert and oriented x4 at this time, but appears confused at this time, bed alarm has gone off twice in past 20 minutes.  Pt instructed to stay in bed.  Attempted to call daughter to see if pt has baseline confusion.  No answer when called.

## 2016-03-15 NOTE — ED Notes (Signed)
Spoke to Dr. Adair Patter on the phone to discuss pt trying to get out of bed.  Dr. Adair Patter has tried to call daughter without success, but I did update her on what his daughter said when I spoke to her moments ago.  No code stroke needed at this time per MD.

## 2016-03-15 NOTE — ED Notes (Signed)
Pt placed on 2L 02 [place] for 02 sats 88%.

## 2016-03-15 NOTE — Patient Instructions (Signed)
Great to meet you!  Given his age, weakness, and COPD I would like you to go to the emergency room for evaluation and monitoring.

## 2016-03-15 NOTE — ED Notes (Addendum)
Pt daughter reached by phone, states that pt has mild dementia. Pt did take percocet this morning and sometimes acts different and "talks out of his head" after administration. Pt states that he did not sleep well last night.

## 2016-03-15 NOTE — H&P (Signed)
History and Physical    Steven [name] JHE:[id number] DOB: 12-03-1928 DOA: 03/15/2016  PCP: [name], [name]  Patient coming from: Home  Chief Complaint: sent from PCP  HPI: Steven [name] is a 83 y.o. male with medical history significant of Lung Cancer, HLD, HTN, MI, Anxiety, that was sent to the ED office for further evaluation.  Patient is mumbling and could not clearly answer questions.  His daughter [name] is bedside and was unable to provide much history stating patient normally takes care of himself and drives himself to [employer] everyday for breakfast.  Patient states he has not felt well "for a while" but cannot elaborate.  Per PCP note from earlier today "Patient explains he's had bilateral anterior leg pain for 4-5 months that's been steadily worsening. He states that over the last several months he has not been able to walk due to the pain.  He also complains of one of cough, sore throat, headache, and chills.  Patient takes Klonopin 3 times daily, his daughter and 5 mg Percocet this morning for pain after that he seemed very sleepy and has had vomiting. States that he lives alone and he does not feel safe going home."  ED Course: Patient tested positive for influenza A at physician's office.  He was set to the ED and bloodwork showing a lactic acid of 1.56, H/H of 10.2/31.9, WBC of 6.3, Platelets of 106.  Kidney function acutely worsened BUN/Cr 45/2.74 (1 month ago 21/1.45), CXR clear of infiltrates, and CT head was performed for change in speech which was negative- daughter does voice that she gave him a percocet earlier and he "goes out of his mind" after taking that.  Review of Systems: As per HPI otherwise 10 point review of systems negative.    Past Medical History:  Diagnosis Date  . Anxiety   . Cancer [facility]) 2013   Lung - cancer free x3 years.   . Hyperlipidemia   . Hypertension   . Myocardial infarction 1999    Past Surgical History:  Procedure Laterality Date    . CARPAL TUNNEL RELEASE  1980's   right and left  . CHOLECYSTECTOMY    . COLONOSCOPY N/A 09/29/2014   Procedure: COLONOSCOPY;  Surgeon: Rogene Houston, MD;  Location[facility];  Service: Endoscopy;  Laterality: N/A;  . CORONARY ARTERY BYPASS GRAFT  1999   5 grafts  . FLEXIBLE BRONCHOSCOPY  11/07/2011   Procedure: FLEXIBLE BRONCHOSCOPY;  Surgeon: Gaye Pollack, MD;  Location: [place];  Service: Thoracic;  Laterality: N/A;  . HERNIA REPAIR  1856   umbilical hernia repair  . JOINT REPLACEMENT  2000   right shoulder rotator cuff repair     reports that he quit smoking about 9 years ago. His smoking use included Cigarettes. He started smoking about 10 years ago. He has a 146.00 pack-year smoking history. He has never used smokeless tobacco. He reports that he does not drink alcohol or use drugs.  No Known Allergies  Family History  Problem Relation Age of Onset  . Cancer Mother   . Pulmonary embolism Father      Prior to Admission medications   Medication Sig Start Date End Date Taking? Authorizing Provider  aspirin 81 MG tablet Take 81 mg by mouth daily.  07/19/15  Yes Historical Provider, MD  clonazePAM (KLONOPIN) 1 MG tablet Take 1 tablet (1 mg total) by mouth 3 (three) times daily as needed. 02/03/16  Yes Mary-Margaret Hassell Done, [name]  doxazosin (  CARDURA) 2 MG tablet Take 1 tablet (2 mg total) by mouth daily. 02/03/16  Yes Mary-Margaret Hassell Done, [name]  Doxylamine Succinate, Sleep, (SLEEP AID PO) Take 1 tablet by mouth at bedtime.   Yes Historical Provider, MD  lisinopril (PRINIVIL,ZESTRIL) 10 MG tablet Take 1 tablet (10 mg total) by mouth daily. 02/03/16  Yes Mary-Margaret Hassell Done, [name]  Multiple Vitamins-Iron (MULTIVITAMINS WITH IRON) TABS tablet Take 1 tablet by mouth daily.   Yes Historical Provider, MD  Sennosides-Docusate Sodium (STOOL SOFTENER & LAXATIVE PO)  07/19/15  Yes Historical Provider, MD    Physical Exam: Vitals:   03/15/16 1701 03/15/16 1703 03/15/16 1726 03/15/16 1732   BP: 111/57   129/55  Pulse: 92  93 97  Resp: '19  17 24  '$ Temp:  99.1 F (37.3 C)    TempSrc:  Oral    SpO2: 94%  100% 100%  Weight:      Height:          Constitutional: NAD, calm, comfortable Vitals:   03/15/16 1701 03/15/16 1703 03/15/16 1726 03/15/16 1732  BP: 111/57   129/55  Pulse: 92  93 97  Resp: '19  17 24  '$ Temp:  99.1 F (37.3 C)    TempSrc:  Oral    SpO2: 94%  100% 100%  Weight:      Height:       Eyes: PERRL, lids and conjunctivae normal ENMT: Mucous membranes are dry. Posterior pharynx clear of any exudate or lesions. Dentures in place. Neck: normal, supple, no masses, no thyromegaly Respiratory: Wheezing on expiration, normal respiratory effort, no rhonchi or rales. No accessory muscle use.  Cardiovascular: Regular rate and rhythm, no murmurs / rubs / gallops. No extremity edema. 2+ pedal pulses. No carotid bruits.  Abdomen: no tenderness, no masses palpated. No hepatosplenomegaly. Bowel sounds positive.  Musculoskeletal: no clubbing / cyanosis. No joint deformity upper and lower extremities. Good ROM, no contractures. Normal muscle tone.  Skin: no rashes, lesions, ulcers. No induration Neurologic: CN 2-12 grossly intact. Sensation intact, DTR normal. Strength 5/5 in all 4 and able to move arms and legs per commands Psychiatric: Poor insight? Alert and oriented x 3 - to person, place, time. Normal mood.     Labs on Admission: I have personally reviewed following labs and imaging studies  CBC:  Recent Labs Lab 03/15/16 1143  WBC 6.3  NEUTROABS 5.3  HGB 10.2*  HCT 31.9*  MCV 95.5  PLT 585*   Basic Metabolic Panel:  Recent Labs Lab 03/15/16 1143  NA 136  K 4.5  CL 104  CO2 24  GLUCOSE 118*  BUN 45*  CREATININE 2.74*  CALCIUM 8.8*   GFR: Estimated Creatinine Clearance: 17.7 mL/min (by C-G formula based on SCr of 2.74 mg/dL (H)). Liver Function Tests:  Recent Labs Lab 03/15/16 1143  AST 29  ALT 19  ALKPHOS 49  BILITOT 0.7  PROT 6.9   ALBUMIN 3.5   No results for input(s): LIPASE, AMYLASE in the last 168 hours. No results for input(s): AMMONIA in the last 168 hours. Coagulation Profile: No results for input(s): INR, PROTIME in the last 168 hours. Cardiac Enzymes: No results for input(s): CKTOTAL, CKMB, CKMBINDEX, TROPONINI in the last 168 hours. BNP (last 3 results) No results for input(s): PROBNP in the last 8760 hours. HbA1C: No results for input(s): HGBA1C in the last 72 hours. CBG:  Recent Labs Lab 03/15/16 1625  GLUCAP 117*   Lipid Profile: No results for input(s): CHOL, HDL, LDLCALC, TRIG,  CHOLHDL, LDLDIRECT in the last 72 hours. Thyroid Function Tests: No results for input(s): TSH, T4TOTAL, FREET4, T3FREE, THYROIDAB in the last 72 hours. Anemia Panel: No results for input(s): VITAMINB12, FOLATE, FERRITIN, TIBC, IRON, RETICCTPCT in the last 72 hours. Urine analysis:    Component Value Date/Time   COLORURINE YELLOW 11/05/2011 1456   APPEARANCEUR CLEAR 11/05/2011 1456   LABSPEC 1.007 11/05/2011 1456   PHURINE 6.0 11/05/2011 1456   GLUCOSEU NEGATIVE 11/05/2011 1456   HGBUR TRACE (A) 11/05/2011 1456   BILIRUBINUR NEGATIVE 11/05/2011 1456   KETONESUR NEGATIVE 11/05/2011 1456   PROTEINUR NEGATIVE 11/05/2011 1456   UROBILINOGEN 0.2 11/05/2011 1456   NITRITE NEGATIVE 11/05/2011 1456   LEUKOCYTESUR MODERATE (A) 11/05/2011 1456   Sepsis Labs: !!!!!!!!!!!!!!!!!!!!!!!!!!!!!!!!!!!!!!!!!!!! '@LABRCNTIP'$ (procalcitonin:4,lacticidven:4) ) Recent Results (from the past 240 hour(s))  Veritor Flu A/B Waived     Status: Abnormal   Collection Time: 03/15/16 10:03 AM  Result Value Ref Range Status   Influenza A Positive (A) Negative Final   Influenza B Negative Negative Final    Comment: If the test is negative for the presence of influenza A or influenza B antigen, infection due to influenza cannot be ruled-out because the antigen present in the sample may be below the detection limit of the test. It is  recommended that these results be confirmed by viral culture or an FDA-cleared influenza A and B molecular assay.      Radiological Exams on Admission: Dg Chest 2 View  Result Date: 03/15/2016 CLINICAL DATA:  Cough, fever, leg aching. History of lung malignancy, former smoker. EXAM: CHEST  2 VIEW COMPARISON:  PA and lateral chest x-ray dated February 03, 2016. FINDINGS: The lungs remain hyperinflated with hemidiaphragm flattening. There is no focal infiltrate. There is no pleural effusion. No pulmonary parenchymal masses are observed. The heart and pulmonary vascularity are normal. There are post CABG changes. There is calcification in the wall of the aortic arch. The bony thorax exhibits no acute abnormality. IMPRESSION: COPD.  Previous CABG.  No pneumonia nor CHF. Thoracic aortic atherosclerosis. Electronically Signed   By: David  Martinique M.D.   On: 03/15/2016 12:00   Ct Head Wo Contrast  Result Date: 03/15/2016 CLINICAL DATA:  History of lung carcinoma, now with worsening weakness over the last few days, possible flu EXAM: CT HEAD WITHOUT CONTRAST TECHNIQUE: Contiguous axial images were obtained from the base of the skull through the vertex without intravenous contrast. COMPARISON:  CT brain scan of 05/21/2011 FINDINGS: Brain: The ventricular system is prominent, as are the cortical sulci consistent with diffuse atrophy. Moderate small vessel ischemic changes noted throughout the periventricular white matter. The septum is midline in position. The fourth ventricle and basilar cisterns are unremarkable. No hemorrhage, mass lesion, or acute infarction is seen. Vascular: No vascular abnormality is seen on this unenhanced study. Skull: On bone window images, no calvarial abnormality is noted. Sinuses/Orbits: The paranasal sinuses are well pneumatized. No abnormality of the orbits is seen. Other: None. IMPRESSION: Atrophy. Moderate small vessel ischemic change. No acute intracranial abnormality.  Electronically Signed   By: Ivar Drape M.D.   On: 03/15/2016 16:56    EKG: Independently reviewed. Shows Sinus rhythm and borderline right axis deviation  Assessment/Plan Principal Problem:   Influenza A Active Problems:   Anxiety state   Essential hypertension   COPD with emphysema (HCC)   CKD (chronic kidney disease), stage III   Influenza    Influenza A - start tamiflu (dosage per kidney function) - droplet precautions -  IVF of 54m/hr x 15 hours - monitor respiratory status  Altered mental status - patient alert and oriented to person, place and time - able to follow commands - no focal deficits on neurologic exam - likely mixture of percocet, acute renal failure and influenza - continue to follow - CT of head negative - daughter voices that patient will get confused after taking percocet  COPD with emphysema - will start prednisone '40mg'$  daily - received azithromycin and ceftriaxone in ED - oxygen therapy to maintain O2 saturations >88%  CKD Stage III - baseline creatine of 1.4 - will give IVF and recheck BMP in am - avoid nephrotoxins - patient appears volume depleted  Anxiety - takes lorazepam at home - will reorder PRN   DVT prophylaxis: Lovenox Code Status:  Full Code (patient previously stated he wished to be DNR but then changed to Full code) Family Communication: Daughter is bedside  [facility]: [facility] pending improvement but given age and comorbidities has significant risk of decompensation from influenza. Will require close monitoring Consults called: None Admission status:  Inpatient, telemetry   ALoretha StaplerMD [facility] Pager [phone] If 7PM-7AM, please contact night-coverage [email or web address] Password [email or web address]  03/15/2016, 5:40 PM

## 2016-03-15 NOTE — ED Triage Notes (Signed)
Pt was seen by PCP today and dx with flu and PNA and told to come to ED for further evaluation. Pt c/o extreme weakness, productive cough, bilateral leg achiness x 2 days. Denies SOB.

## 2016-03-15 NOTE — Progress Notes (Signed)
   HPI  Patient presents today with acute illness.  Patient explains he's had bilateral anterior leg pain for 4-5 months that's been steadily worsening. He states that over the last several months he has not been able to walk due to the pain.  He also complains of one of cough, sore throat, headache, and chills.  Patient takes Klonopin 3 times daily, his daughter and 5 mg Percocet this morning for pain after that he seemed very sleepy and has had vomiting.  States that he lives alone and he does not feel safe going home.  PMH: Smoking status noted ROS: Per HPI  Objective: BP (!) 94/43   Pulse 75   Temp 99.9 F (37.7 C) (Oral)   SpO2 92%  Gen: NAD, alert, cooperative with exam HEENT: NCAT, oropharynx slightly tachy,  CV: RRR, good S1/S2, no murmur Resp: Decreased breath sounds in the left base and left upper lung field, does not appear to be labored, 92% oxygen on room air Ext: No edema, warm Neuro: Alert and oriented,   Assessment and plan:  # Influenza With decreased breath sounds left lower lobe, I'm suspicious of superimposed pneumonia. She has soft blood pressure, COPD, and appears extremely weak on exam. I recommended emergency room evaluation given comorbidities and the severity of his apparent illness. Rapid influenza test is positive for flu A today His daughter is with him and states that she will come straight to the emergency room at [facility].  Pt has had this BP previously. He does not appear septic but I think ED follow up of labs, treatment (nebs, consider steroids), and monitoring is necessary.     Orders Placed This Encounter  Procedures  . Veritor Flu A/B Waived    Order Specific Question:   Source    Answer:   nasal     Laroy Apple, MD [facility] 03/15/2016, 10:17 AM

## 2016-03-15 NOTE — ED Notes (Signed)
Dr. Vic Ripper at bedside and daughter at bedside at this time.

## 2016-03-15 NOTE — ED Notes (Signed)
Magda Paganini, RN notified that warning message popped up when scanning azithromycin.  Warning said that if zofran is given with azithromycin, it could cause QT prolongation.  Notified her to pass off to next nurse.

## 2016-03-15 NOTE — ED Notes (Signed)
Per Dr. Adair Patter, will make pt a telemetry pt and will put in new bed order.

## 2016-03-16 DIAGNOSIS — J111 Influenza due to unidentified influenza virus with other respiratory manifestations: Secondary | ICD-10-CM

## 2016-03-16 DIAGNOSIS — I10 Essential (primary) hypertension: Secondary | ICD-10-CM

## 2016-03-16 LAB — BASIC METABOLIC PANEL
Anion gap: 4 — ABNORMAL LOW (ref 5–15)
BUN: 41 mg/dL — ABNORMAL HIGH (ref 6–20)
CO2: 25 mmol/L (ref 22–32)
Calcium: 8.4 mg/dL — ABNORMAL LOW (ref 8.9–10.3)
Chloride: 108 mmol/L (ref 101–111)
Creatinine, Ser: 2.07 mg/dL — ABNORMAL HIGH (ref 0.61–1.24)
GFR calc Af Amer: 32 mL/min — ABNORMAL LOW (ref >60)
GFR calc non Af Amer: 28 mL/min — ABNORMAL LOW (ref >60)
Glucose, Bld: 96 mg/dL (ref 65–99)
Potassium: 4.9 mmol/L (ref 3.5–5.1)
Sodium: 137 mmol/L (ref 135–145)

## 2016-03-16 LAB — URINALYSIS, ROUTINE W REFLEX MICROSCOPIC
Bacteria, UA: NONE SEEN
Bilirubin Urine: NEGATIVE
Glucose, UA: NEGATIVE mg/dL
Ketones, ur: NEGATIVE mg/dL
Leukocytes, UA: NEGATIVE
Nitrite: NEGATIVE
Protein, ur: NEGATIVE mg/dL
Specific Gravity, Urine: 1.013 (ref 1.005–1.030)
pH: 5 (ref 5.0–8.0)

## 2016-03-16 LAB — CBC
HCT: 28.6 % — ABNORMAL LOW (ref 39.0–52.0)
Hemoglobin: 9.1 g/dL — ABNORMAL LOW (ref 13.0–17.0)
MCH: 30.4 pg (ref 26.0–34.0)
MCHC: 31.8 g/dL (ref 30.0–36.0)
MCV: 95.7 fL (ref 78.0–100.0)
Platelets: 100 10*3/uL — ABNORMAL LOW (ref 150–400)
RBC: 2.99 MIL/uL — ABNORMAL LOW (ref 4.22–5.81)
RDW: 14.4 % (ref 11.5–15.5)
WBC: 4.4 10*3/uL (ref 4.0–10.5)

## 2016-03-16 MED ORDER — CEFTRIAXONE SODIUM 1 G IJ SOLR
1.0000 g | Freq: Once | INTRAMUSCULAR | Status: AC
Start: 2016-03-16 — End: 2016-03-16
  Administered 2016-03-16: 1 g via INTRAVENOUS
  Filled 2016-03-16: qty 10

## 2016-03-16 MED ORDER — DEXTROSE 5 % IV SOLN
500.0000 mg | Freq: Once | INTRAVENOUS | Status: AC
  Administered 2016-03-16: 500 mg via INTRAVENOUS
  Filled 2016-03-16: qty 500

## 2016-03-16 NOTE — Care Management Note (Signed)
Case Management Note  Patient Details  Name: RYDER MAN MRN: [id number] Date of Birth: 1928/12/10  Subjective/Objective:                  Pt admitted with flu. Pt is from home, ind with ADL's. Pt has no DME PTA. He was recommended for SNF Pt refuses and would like to DC home with [facility] services. Pt has no preference of provider. [name] able to provide services. Pt is agreeable with this. Pt aware HH has 48hrs to make first visit. Tommi Rumps of [place], aware of referral and will obtain pt info from chart. DC plan discussed with pt's daughter.   Action/Plan: DC home with [facility] services through [place].  Expected Discharge Date:  03/17/16               Expected Discharge Plan:  [place]  In-House Referral:  NA  Discharge planning Services  CM Consult  Post Acute Care Choice:  Home Health Choice offered to:  Patient  HH Arranged:  PT [place]:  [place]  Status of Service:  Completed, signed off  Sherald Barge, RN 03/16/2016, 3:53 PM

## 2016-03-16 NOTE — Evaluation (Signed)
Occupational Therapy Evaluation Patient Details Name: Steven [name] MRN: [id number] DOB: 1928/06/07 Today's Date: 03/16/2016    History of Present Illness Steven [name] is a 83 y.o. male with medical history significant of Lung Cancer, HLD, HTN, MI, Anxiety, that was sent to the ED office for further evaluation.  Patient is mumbling and could not clearly answer questions.  His daughter Steven [name] is bedside and was unable to provide much history stating patient normally takes care of himself and drives himself to [employer] everyday for breakfast.  Patient states he has not felt well "for a while" but cannot elaborate.  Per PCP note from earlier today "Patient explains he's had bilateral anterior leg pain for 4-5 months that's been steadily worsening. He states that over the last several months he has not been able to walk due to the pain.  He also complains of one of cough, sore throat, headache, and chills.  Patient takes Klonopin 3 times daily, his daughter and 5 mg Percocet this morning (03/15/16) for pain after that he seemed very sleepy and has had vomiting. States that he lives alone and he does not feel safe going home."   Clinical Impression   Pt awake, alert, oriented x4 this am, agreeable to OT evaluation. Pt cognition is WNL this am, no confusion during evaluation. Pt's only complaint at this time is congestion. Pt appears to be at baseline functioning with ADL completion and functional mobility, assistance for managing IV during functional mobility. Pt does not report feeling unsafe in his home at this time, daughters are local however pt reports he does not require assistance for any tasks at this time. No family present to provide additional information or confirm pt reports. At this time no further OT services required as pt appears to be at baseline functioning.     Follow Up Recommendations  No OT follow up;Supervision - Intermittent    Equipment Recommendations  None recommended by OT        Precautions / Restrictions Restrictions Weight Bearing Restrictions: No      Mobility Bed Mobility Overal bed mobility: Modified Independent                Transfers Overall transfer level: Modified independent                         ADL Overall ADL's : Needs assistance/impaired     Grooming: Wash/dry hands;Supervision/safety;Standing               Lower Body Dressing: Modified independent;Sitting/lateral leans   Toilet Transfer: Social worker Details (indicate cue type and reason): assistance with managing lines         Functional mobility during ADLs: Supervision/safety (assist for managing lines)                 Pertinent Vitals/Pain Pain Assessment: No/denies pain     Hand Dominance Right   Extremity/Trunk Assessment Upper Extremity Assessment Upper Extremity Assessment: Generalized weakness   Lower Extremity Assessment Lower Extremity Assessment: Defer to PT evaluation      Communication Communication Communication: No difficulties   Cognition Arousal/Alertness: Awake/alert Behavior During Therapy: WFL for tasks assessed/performed Overall Cognitive Status: Within Functional Limits for tasks assessed                                Home Living Family/patient expects to be discharged to:: Private  residence Living Arrangements: Alone   Type of Home: Apartment Home Access: [place]: One level     Bathroom Shower/Tub: Tub/shower unit;Walk-in shower (pt sponge bathes)   Bathroom Toilet: Standard     Home Equipment: None          Prior Functioning/Environment Level of Independence: Independent                  End of Session Equipment Utilized During Treatment: Gait belt  Activity Tolerance: Patient tolerated treatment well Patient left: in bed;with call bell/phone within reach;with bed alarm set   Time: 9093-1121 OT Time  Calculation (min): 15 min Charges:  OT General Charges $OT Visit: 1 Procedure OT Evaluation $OT Eval Low Complexity: 1 Procedure Guadelupe Sabin, OTR/L  [phone] 03/16/2016, 10:53 AM

## 2016-03-16 NOTE — Progress Notes (Signed)
PROGRESS NOTE    Steven [name]  PXT:[id number] DOB: 05/10/1928 DOA: 03/15/2016 PCP: [name], [name]    Brief Narrative:  Steven [name] is a 83 y.o. male with medical history significant of Lung Cancer, HLD, HTN, MI, Anxiety, that was sent to the ED office for further evaluation.  Patient is mumbling and could not clearly answer questions.  His daughter [name] is bedside and was unable to provide much history stating patient normally takes care of himself and drives himself to [employer] everyday for breakfast.  Patient states he has not felt well "for a while" but cannot elaborate.  Per PCP note from earlier today "Patient explains he's had bilateral anterior leg pain for 4-5 months that's been steadily worsening. He states that over the last several months he has not been able to walk due to the pain.  He also complains of one of cough, sore throat, headache, and chills.  Patient takes Klonopin 3 times daily, his daughter and 5 mg Percocet this morning for pain after that he seemed very sleepy and has had vomiting. States that he lives alone and he does not feel safe going home."   Assessment & Plan:   Principal Problem:   Influenza A Active Problems:   Anxiety state   Essential hypertension   COPD with emphysema (HCC)   CKD (chronic kidney disease), stage III   Influenza   Influenza A - start tamiflu (dosage per kidney function) - droplet precautions - monitor respiratory status  Altered mental status - patient alert and oriented to person, place and time - able to follow commands - no focal deficits on neurologic exam - likely mixture of percocet, acute renal failure and influenza - seems to have resolved - CT of head negative - daughter voices that patient will get confused after taking percocet  COPD with emphysema - will start prednisone '40mg'$  daily - received azithromycin and ceftriaxone in ED - oxygen therapy to maintain O2 saturations >88%  CKD Stage  III - baseline creatine of 1.4 - avoid nephrotoxins - patient appears volume depleted - will order IVF overnight at 15m/hr for 10 hours - repeat BMP in am  Anxiety - takes lorazepam at home - will reorder PRN   DVT prophylaxis: Lovenox Code Status:  Full Code  Family Communication: no family is bedside  Disposition Plan: if improvement in kidney function and mobility may be able to discharge in 24-48 hours   Consultants:   None  Procedures:   None  Antimicrobials:   Tamiflu    Subjective: Patient seen and examined after breakfast.  He was awake and alert.  He was talking more clearly than yesterday at admission.  Denies any chest pain or chest pressure.  Does voice he has some congestion in his chest and was asking for medication to possibly help with that.    Objective: Vitals:   03/15/16 2137 03/15/16 2154 03/16/16 0433 03/16/16 1407  BP:  (!) 111/33 (!) 112/34 (!) 124/46  Pulse:  87 81 84  Resp:  '18 17 18  '$ Temp:  98.2 F (36.8 C) 98.9 F (37.2 C) 98.4 F (36.9 C)  TempSrc:  Oral Oral Oral  SpO2: (!) 86% 100% 92% 95%  Weight:      Height:        Intake/Output Summary (Last 24 hours) at 03/16/16 1824 Last data filed at 03/16/16 1814  Gross per 24 hour  Intake  1330 ml  Output              700 ml  Net              630 ml   Filed Weights   03/15/16 1115 03/15/16 1758  Weight: 63.5 kg (140 lb) 62.5 kg (137 lb 12.8 oz)    Examination:  General exam: Appears calm and comfortable  Respiratory system: Some scattered wheezing and rhonchi, normal respiratory effort Cardiovascular system: S1 & S2 heard, RRR. No JVD, murmurs, rubs, gallops or clicks. No pedal edema. Gastrointestinal system: Abdomen is nondistended, soft and nontender. No organomegaly or masses felt. Normal bowel sounds heard. Central nervous system: Alert and oriented. No focal neurological deficits. Extremities: Symmetric 5 x 5 power. Skin: No rashes, lesions or  ulcers Psychiatry: Judgement and insight appear normal. Mood & affect appropriate.     Data Reviewed: I have personally reviewed following labs and imaging studies  CBC:  Recent Labs Lab 03/15/16 1143 03/16/16 0545  WBC 6.3 4.4  NEUTROABS 5.3  --   HGB 10.2* 9.1*  HCT 31.9* 28.6*  MCV 95.5 95.7  PLT 106* 191*   Basic Metabolic Panel:  Recent Labs Lab 03/15/16 1143 03/16/16 0545  NA 136 137  K 4.5 4.9  CL 104 108  CO2 24 25  GLUCOSE 118* 96  BUN 45* 41*  CREATININE 2.74* 2.07*  CALCIUM 8.8* 8.4*   GFR: Estimated Creatinine Clearance: 23.1 mL/min (by C-G formula based on SCr of 2.07 mg/dL (H)). Liver Function Tests:  Recent Labs Lab 03/15/16 1143  AST 29  ALT 19  ALKPHOS 49  BILITOT 0.7  PROT 6.9  ALBUMIN 3.5   No results for input(s): LIPASE, AMYLASE in the last 168 hours.  Recent Labs Lab 03/15/16 1955  AMMONIA 26   Coagulation Profile: No results for input(s): INR, PROTIME in the last 168 hours. Cardiac Enzymes: No results for input(s): CKTOTAL, CKMB, CKMBINDEX, TROPONINI in the last 168 hours. BNP (last 3 results) No results for input(s): PROBNP in the last 8760 hours. HbA1C: No results for input(s): HGBA1C in the last 72 hours. CBG:  Recent Labs Lab 03/15/16 1625  GLUCAP 117*   Lipid Profile: No results for input(s): CHOL, HDL, LDLCALC, TRIG, CHOLHDL, LDLDIRECT in the last 72 hours. Thyroid Function Tests: No results for input(s): TSH, T4TOTAL, FREET4, T3FREE, THYROIDAB in the last 72 hours. Anemia Panel: No results for input(s): VITAMINB12, FOLATE, FERRITIN, TIBC, IRON, RETICCTPCT in the last 72 hours. Sepsis Labs:  Recent Labs Lab 03/15/16 1156  LATICACIDVEN 1.56    Recent Results (from the past 240 hour(s))  Veritor Flu A/B Waived     Status: Abnormal   Collection Time: 03/15/16 10:03 AM  Result Value Ref Range Status   Influenza A Positive (A) Negative Final   Influenza B Negative Negative Final    Comment: If the test  is negative for the presence of influenza A or influenza B antigen, infection due to influenza cannot be ruled-out because the antigen present in the sample may be below the detection limit of the test. It is recommended that these results be confirmed by viral culture or an FDA-cleared influenza A and B molecular assay.          Radiology Studies: Dg Chest 2 View  Result Date: 03/15/2016 CLINICAL DATA:  Cough, fever, leg aching. History of lung malignancy, former smoker. EXAM: CHEST  2 VIEW COMPARISON:  PA and lateral chest x-ray dated February 03, 2016. FINDINGS:  The lungs remain hyperinflated with hemidiaphragm flattening. There is no focal infiltrate. There is no pleural effusion. No pulmonary parenchymal masses are observed. The heart and pulmonary vascularity are normal. There are post CABG changes. There is calcification in the wall of the aortic arch. The bony thorax exhibits no acute abnormality. IMPRESSION: COPD.  Previous CABG.  No pneumonia nor CHF. Thoracic aortic atherosclerosis. Electronically Signed   By: David  Martinique M.D.   On: 03/15/2016 12:00   Ct Head Wo Contrast  Result Date: 03/15/2016 CLINICAL DATA:  History of lung carcinoma, now with worsening weakness over the last few days, possible flu EXAM: CT HEAD WITHOUT CONTRAST TECHNIQUE: Contiguous axial images were obtained from the base of the skull through the vertex without intravenous contrast. COMPARISON:  CT brain scan of 05/21/2011 FINDINGS: Brain: The ventricular system is prominent, as are the cortical sulci consistent with diffuse atrophy. Moderate small vessel ischemic changes noted throughout the periventricular white matter. The septum is midline in position. The fourth ventricle and basilar cisterns are unremarkable. No hemorrhage, mass lesion, or acute infarction is seen. Vascular: No vascular abnormality is seen on this unenhanced study. Skull: On bone window images, no calvarial abnormality is noted.  Sinuses/Orbits: The paranasal sinuses are well pneumatized. No abnormality of the orbits is seen. Other: None. IMPRESSION: Atrophy. Moderate small vessel ischemic change. No acute intracranial abnormality. Electronically Signed   By: Ivar Drape M.D.   On: 03/15/2016 16:56        Scheduled Meds: . aspirin  81 mg Oral Daily  . azithromycin  500 mg Intravenous Once  . cefTRIAXone (ROCEPHIN)  IV  1 g Intravenous Once  . docusate sodium  100 mg Oral BID  . doxazosin  2 mg Oral Daily  . enoxaparin (LOVENOX) injection  30 mg Subcutaneous Q24H  . lisinopril  10 mg Oral Daily  . oseltamivir  30 mg Oral Daily  . predniSONE  40 mg Oral Q breakfast   Continuous Infusions: . sodium chloride       LOS: 1 day    Time spent: 30 minutes    Loretha Stapler, MD [facility] Pager [phone]  If 7PM-7AM, please contact night-coverage [email or web address] Password [facility] 03/16/2016, 6:24 PM

## 2016-03-16 NOTE — Clinical Social Work Note (Signed)
CSW met with pt at bedside. Pt aware of where he was, date, and admission diagnosis. He states he lives alone and his daughter, Steven [name] is his best support. Pt feels that it is difficult being at home alone, but he manages. Pt still drives during the day and is independent with ADLs. CSW discussed PT evaluation recommending SNF. Pt asked about details and insurance authorization. He feels insurance may not approve and is more interested in home health services. CM notified. CSW will sign off as plan is to return home.  [name], [name]

## 2016-03-16 NOTE — Plan of Care (Signed)
Problem: Education: Goal: Knowledge of [facility] Education information/materials will improve Outcome: Not Met (add Reason) Patient confused at the beginning of admission, no family present at this time

## 2016-03-16 NOTE — Care Management Important Message (Signed)
Important Message  Patient Details  Name: LUNDEN MCLEISH MRN: [id number] Date of Birth: 03-20-1928   Medicare Important Message Given:  Yes    Sherald Barge, RN 03/16/2016, 3:32 PM

## 2016-03-16 NOTE — Evaluation (Signed)
Physical Therapy Evaluation Patient Details Name: Steven [name] MRN: [id number] DOB: [birth year] Today's Date: 03/16/2016   History of Present Illness  Steven [name] is a 83 y.o. male with medical history significant of Lung Cancer, HLD, HTN, MI, Anxiety, that was sent to the ED office for further evaluation.  Patient is mumbling and could not clearly answer questions.  His daughter [name] is bedside and was unable to provide much history stating patient normally takes care of himself and drives himself to [employer] everyday for breakfast.  Patient states he has not felt well "for a while" but cannot elaborate.  Per PCP note from earlier today "Patient explains he's had bilateral anterior leg pain for 4-5 months that's been steadily worsening. He states that over the last several months he has not been able to walk due to the pain.  He also complains of one of cough, sore throat, headache, and chills.  Patient takes Klonopin 3 times daily, his daughter and 5 mg Percocet this morning for pain after that he seemed very sleepy and has had vomiting. States that he lives alone and he does not feel safe going home."  Clinical Impression  PT received in bed, family present, but left during mobility evaluation.  Pt is agreeable PT evaluation.  Pt states that he is normally ambulatory without assistive device, however he states he is limited due to pain in B LE's.  He is independent with dressing and bathing.  He states he still drives and gets out to the grocery store, however he does express that he is limited with this due to his B LE pain.  During PT evaluation today he required at least Min A for ambulation due to unsteadiness as well as poor safety awareness.  He ambulated into the bathroom, but while getting there, he attempted to step over the IV line that the PT was managing.  The IV line was at least waist high, and PT had to prevent pt from attempting to do this.  Pt was very SOB, during mobility and SpO2  had desaturated to 85% on RA, but improved to 93% when placed back on 2L. At this point, he is not safe to d/c home due to being a high risk for falling with poor endurance and poor safety awareness.  Recommend SNF at this time.      Follow Up Recommendations SNF    Equipment Recommendations  None recommended by PT    Recommendations for Other Services       Precautions / Restrictions Precautions Precautions: Fall Precaution Comments: Due to unsteadiness and poor safety awareness noted during evaluation today.  Restrictions Weight Bearing Restrictions: No      Mobility  Bed Mobility Overal bed mobility: Needs Assistance Bed Mobility: Supine to Sit     Supine to sit: Supervision        Transfers Overall transfer level: Needs assistance Equipment used: 1 person hand held assist Transfers: Sit to/from Stand Sit to Stand: Min assist            Ambulation/Gait Ambulation/Gait assistance: Min assist Ambulation Distance (Feet): 20 Feet Assistive device: 1 person hand held assist Gait Pattern/deviations: Step-to pattern     General Gait Details: Pt holding onto furniture in the room during gait training.  Pt was assisted into the bathroom, however while trying to get into the bathroom, PT was guiding the IV, however pt felt like he needed to step over the IV line.  PT had to stop him  from stepping over it.  Pt is very unsteady at this time.    Stairs            Wheelchair Mobility    Modified Rankin (Stroke Patients Only)       Balance Overall balance assessment: Needs assistance Sitting-balance support: Feet supported;No upper extremity supported Sitting balance-Leahy Scale: Good     Standing balance support: Single extremity supported Standing balance-Leahy Scale: Poor                               Pertinent Vitals/Pain Pain Assessment: 0-10 Pain Score: 1  Pain Location: in his hips bilaterally - pt states it normally hurts when he  is walking.  Pain Intervention(s): Limited activity within patient's tolerance;Monitored during session;Repositioned    Home Living   Living Arrangements: Alone   Type of Home: Apartment Home Access: Elevator (pt lives on the bottom floor. )     Home Layout: One level Home Equipment: None      Prior Function Level of Independence: Independent   Gait / Transfers Assistance Needed: independent.  Pt still drives.  Pt is able to ambulate in the community and does his own grocery shopping.  Pt states he's able to walk to the mailbox before his legs start to hurt from the hips down to the toes.   ADL's / Homemaking Assistance Needed: independent        Hand Dominance   Dominant Hand: Right    Extremity/Trunk Assessment   Upper Extremity Assessment Upper Extremity Assessment: Generalized weakness    Lower Extremity Assessment Lower Extremity Assessment: Generalized weakness    Cervical / Trunk Assessment Cervical / Trunk Assessment: Normal  Communication   Communication: No difficulties  Cognition Arousal/Alertness: Awake/alert Behavior During Therapy: WFL for tasks assessed/performed Overall Cognitive Status: Within Functional Limits for tasks assessed                      General Comments      Exercises     Assessment/Plan    PT Assessment Patient needs continued PT services  PT Problem List Decreased strength;Decreased balance;Decreased mobility;Decreased coordination;Decreased knowledge of use of DME;Decreased safety awareness;Decreased knowledge of precautions;Cardiopulmonary status limiting activity          PT Treatment Interventions DME instruction;Gait training;Functional mobility training;Therapeutic activities;Therapeutic exercise;Balance training;Patient/family education    PT Goals (Current goals can be found in the Care Plan section)  Acute Rehab PT Goals Patient Stated Goal: to get stronger PT Goal Formulation: With patient Time For  Goal Achievement: 03/23/16 Potential to Achieve Goals: Good    Frequency Min 3X/week   Barriers to discharge Decreased caregiver support Pt lives alone    Co-evaluation               End of Session Equipment Utilized During Treatment: Gait belt;Oxygen Activity Tolerance: Patient limited by fatigue Patient left: in chair;with call bell/phone within reach;with chair alarm set Nurse Communication: Mobility status Lattie Haw, RN notified of pt's location and mobility status.  )    Functional Assessment Tool Used: [place] "6-clicks"  Functional Limitation: Mobility: Walking and moving around Mobility: Walking and Moving Around Current Status [phone]): At least 40 percent but less than 60 percent impaired, limited or restricted Mobility: Walking and Moving Around Goal Status [phone]): At least 20 percent but less than 40 percent impaired, limited or restricted    Time: 1660-6301 PT Time Calculation (min) (  ACUTE ONLY): 45 min   Charges:   PT Evaluation $PT Eval Low Complexity: 1 Procedure PT Treatments $Gait Training: 8-22 mins $Therapeutic Activity: 8-22 mins   PT G Codes:   PT G-Codes **NOT FOR INPATIENT CLASS** Functional Assessment Tool Used: [employer] "6-clicks"  Functional Limitation: Mobility: Walking and moving around Mobility: Walking and Moving Around Current Status [phone]): At least 40 percent but less than 60 percent impaired, limited or restricted Mobility: Walking and Moving Around Goal Status [phone]): At least 20 percent but less than 40 percent impaired, limited or restricted    Beth Crosby Oriordan, PT, DPT X: [phone]

## 2016-03-17 LAB — CBC WITH DIFFERENTIAL/PLATELET
Basophils Absolute: 0 10*3/uL (ref 0.0–0.1)
Basophils Relative: 0 %
Eosinophils Absolute: 0 10*3/uL (ref 0.0–0.7)
Eosinophils Relative: 0 %
HCT: 30.8 % — ABNORMAL LOW (ref 39.0–52.0)
Hemoglobin: 9.8 g/dL — ABNORMAL LOW (ref 13.0–17.0)
Lymphocytes Relative: 15 %
Lymphs Abs: 0.7 10*3/uL (ref 0.7–4.0)
MCH: 30.5 pg (ref 26.0–34.0)
MCHC: 31.8 g/dL (ref 30.0–36.0)
MCV: 96 fL (ref 78.0–100.0)
Monocytes Absolute: 0.4 10*3/uL (ref 0.1–1.0)
Monocytes Relative: 8 %
Neutro Abs: 3.6 10*3/uL (ref 1.7–7.7)
Neutrophils Relative %: 78 %
Platelets: 109 10*3/uL — ABNORMAL LOW (ref 150–400)
RBC: 3.21 MIL/uL — ABNORMAL LOW (ref 4.22–5.81)
RDW: 14.1 % (ref 11.5–15.5)
WBC: 4.6 10*3/uL (ref 4.0–10.5)

## 2016-03-17 LAB — BASIC METABOLIC PANEL
Anion gap: 4 — ABNORMAL LOW (ref 5–15)
Anion gap: 4 — ABNORMAL LOW (ref 5–15)
BUN: 32 mg/dL — ABNORMAL HIGH (ref 6–20)
BUN: 33 mg/dL — ABNORMAL HIGH (ref 6–20)
CO2: 26 mmol/L (ref 22–32)
CO2: 27 mmol/L (ref 22–32)
Calcium: 8.8 mg/dL — ABNORMAL LOW (ref 8.9–10.3)
Calcium: 9.1 mg/dL (ref 8.9–10.3)
Chloride: 108 mmol/L (ref 101–111)
Chloride: 107 mmol/L (ref 101–111)
Creatinine, Ser: 1.71 mg/dL — ABNORMAL HIGH (ref 0.61–1.24)
Creatinine, Ser: 1.58 mg/dL — ABNORMAL HIGH (ref 0.61–1.24)
GFR calc Af Amer: 40 mL/min — ABNORMAL LOW (ref >60)
GFR calc Af Amer: 44 mL/min — ABNORMAL LOW (ref >60)
GFR calc non Af Amer: 35 mL/min — ABNORMAL LOW (ref >60)
GFR calc non Af Amer: 38 mL/min — ABNORMAL LOW (ref >60)
Glucose, Bld: 106 mg/dL — ABNORMAL HIGH (ref 65–99)
Glucose, Bld: 117 mg/dL — ABNORMAL HIGH (ref 65–99)
Potassium: 5.3 mmol/L — ABNORMAL HIGH (ref 3.5–5.1)
Potassium: 5.4 mmol/L — ABNORMAL HIGH (ref 3.5–5.1)
Sodium: 138 mmol/L (ref 135–145)
Sodium: 138 mmol/L (ref 135–145)

## 2016-03-17 MED ORDER — ALBUTEROL SULFATE (2.5 MG/3ML) 0.083% IN NEBU
2.5000 mg | INHALATION_SOLUTION | RESPIRATORY_TRACT | Status: DC | PRN
  Administered 2016-03-18: 2.5 mg via RESPIRATORY_TRACT
  Filled 2016-03-17: qty 3

## 2016-03-17 MED ORDER — SODIUM CHLORIDE 0.9 % IV BOLUS (SEPSIS)
500.0000 mL | Freq: Once | INTRAVENOUS | Status: AC
  Administered 2016-03-17: 500 mL via INTRAVENOUS

## 2016-03-17 MED ORDER — OSELTAMIVIR PHOSPHATE 30 MG PO CAPS
30.0000 mg | ORAL_CAPSULE | Freq: Two times a day (BID) | ORAL | Status: DC
  Administered 2016-03-17 – 2016-03-18 (×2): 30 mg via ORAL
  Filled 2016-03-17 (×2): qty 1

## 2016-03-17 MED ORDER — ALBUTEROL SULFATE (2.5 MG/3ML) 0.083% IN NEBU
INHALATION_SOLUTION | RESPIRATORY_TRACT | Status: AC
  Administered 2016-03-17: 2.5 mg
  Filled 2016-03-17: qty 3

## 2016-03-17 MED ORDER — SENNOSIDES-DOCUSATE SODIUM 8.6-50 MG PO TABS
2.0000 | ORAL_TABLET | Freq: Every day | ORAL | Status: DC
  Administered 2016-03-17: 2 via ORAL
  Filled 2016-03-17: qty 2

## 2016-03-17 NOTE — Progress Notes (Signed)
PROGRESS NOTE    Steven [name]  BSJ:[id number] DOB: 07/29/1928 DOA: 03/15/2016 PCP: [name], [name]    Brief Narrative:  Steven [name] is a 83 y.o. male with medical history significant of Lung Cancer, HLD, HTN, MI, Anxiety, that was sent to the ED office for further evaluation.  Patient is mumbling and could not clearly answer questions.  His daughter [name] is bedside and was unable to provide much history stating patient normally takes care of himself and drives himself to [employer] everyday for breakfast.  Patient states he has not felt well "for a while" but cannot elaborate.  Per PCP note from earlier today "Patient explains he's had bilateral anterior leg pain for 4-5 months that's been steadily worsening. He states that over the last several months he has not been able to walk due to the pain.  He also complains of one of cough, sore throat, headache, and chills.  Patient takes Klonopin 3 times daily, his daughter and 5 mg Percocet this morning for pain after that he seemed very sleepy and has had vomiting. States that he lives alone and he does not feel safe going home."   Assessment & Plan:   Principal Problem:   Influenza A Active Problems:   Anxiety state   Essential hypertension   COPD with emphysema (HCC)   CKD (chronic kidney disease), stage III   Influenza   Influenza A - start tamiflu (dosage per kidney function) - droplet precautions - monitor respiratory status - currently on 2L [place]  Altered mental status - resolved - CT of head negative - will avoid giving sedating or mind altering medications  COPD with emphysema - will start prednisone '40mg'$  daily - received azithromycin and ceftriaxone in ED - oxygen therapy to maintain O2 saturations >88% - currently on 2L [place]  CKD Stage III - baseline creatine of 1.4 - Creatinine improved to 1.58 - avoid nephrotoxins - repeat BMP in am  Anxiety - takes lorazepam at home - will reorder  PRN  Hyperkalemia - giving 557m bolus - repeat BMP in am   DVT prophylaxis: Lovenox Code Status:  Full Code  Family Communication: no family is bedside  Disposition Plan: hopeful to discharge in 24 hours   Consultants:   None  Procedures:   None  Antimicrobials:   Tamiflu    Subjective: Patient sitting up in chair.  States he feels lousy today.  Voices his energy is just gone.  Doesn't feel like he could stay at home safely by himself.  Didn't sleep much- thinks that is due to feeling poorly.    Objective: Vitals:   03/16/16 0433 03/16/16 1407 03/16/16 2110 03/17/16 0633  BP: (!) 112/34 (!) 124/46 140/64 (!) 124/57  Pulse: 81 84 73 72  Resp: '17 18 18 18  '$ Temp: 98.9 F (37.2 C) 98.4 F (36.9 C) 98.1 F (36.7 C) 99.5 F (37.5 C)  TempSrc: Oral Oral Oral Oral  SpO2: 92% 95% 100% 96%  Weight:      Height:        Intake/Output Summary (Last 24 hours) at 03/17/16 1450 Last data filed at 03/17/16 0336  Gross per 24 hour  Intake           1452.5 ml  Output              900 ml  Net            552.5 ml   Filed Weights   03/15/16 1115 03/15/16 1758  Weight: 63.5 kg (140 lb) 62.5 kg (137 lb 12.8 oz)    Examination:  General exam: Appears in mild distress Respiratory system: Some scattered wheezing and rhonchi, slightly increased respiratory effort Cardiovascular system: S1 & S2 heard, RRR. No JVD, murmurs, rubs, gallops or clicks. No pedal edema. Gastrointestinal system: Abdomen is nondistended, soft and nontender. No organomegaly or masses felt. Normal bowel sounds heard. Central nervous system: Alert and oriented. No focal neurological deficits. Extremities: Symmetric 5 x 5 power. Skin: No rashes, lesions or ulcers Psychiatry: Judgement and insight appear normal. Mood & affect appropriate.   Telemetry showing PVCs and sinus rhythm  Data Reviewed: I have personally reviewed following labs and imaging studies  CBC:  Recent Labs Lab 03/15/16 1143  03/16/16 0545 03/17/16 0624  WBC 6.3 4.4 4.6  NEUTROABS 5.3  --  3.6  HGB 10.2* 9.1* 9.8*  HCT 31.9* 28.6* 30.8*  MCV 95.5 95.7 96.0  PLT 106* 100* 397*   Basic Metabolic Panel:  Recent Labs Lab 03/15/16 1143 03/16/16 0545 03/17/16 0624 03/17/16 1116  NA 136 137 138 138  K 4.5 4.9 5.3* 5.4*  CL 104 108 108 107  CO2 '24 25 26 27  '$ GLUCOSE 118* 96 106* 117*  BUN 45* 41* 32* 33*  CREATININE 2.74* 2.07* 1.71* 1.58*  CALCIUM 8.8* 8.4* 8.8* 9.1   GFR: Estimated Creatinine Clearance: 30.2 mL/min (by C-G formula based on SCr of 1.58 mg/dL (H)). Liver Function Tests:  Recent Labs Lab 03/15/16 1143  AST 29  ALT 19  ALKPHOS 49  BILITOT 0.7  PROT 6.9  ALBUMIN 3.5   No results for input(s): LIPASE, AMYLASE in the last 168 hours.  Recent Labs Lab 03/15/16 1955  AMMONIA 26   Coagulation Profile: No results for input(s): INR, PROTIME in the last 168 hours. Cardiac Enzymes: No results for input(s): CKTOTAL, CKMB, CKMBINDEX, TROPONINI in the last 168 hours. BNP (last 3 results) No results for input(s): PROBNP in the last 8760 hours. HbA1C: No results for input(s): HGBA1C in the last 72 hours. CBG:  Recent Labs Lab 03/15/16 1625  GLUCAP 117*   Lipid Profile: No results for input(s): CHOL, HDL, LDLCALC, TRIG, CHOLHDL, LDLDIRECT in the last 72 hours. Thyroid Function Tests: No results for input(s): TSH, T4TOTAL, FREET4, T3FREE, THYROIDAB in the last 72 hours. Anemia Panel: No results for input(s): VITAMINB12, FOLATE, FERRITIN, TIBC, IRON, RETICCTPCT in the last 72 hours. Sepsis Labs:  Recent Labs Lab 03/15/16 1156  LATICACIDVEN 1.56    Recent Results (from the past 240 hour(s))  Veritor Flu A/B Waived     Status: Abnormal   Collection Time: 03/15/16 10:03 AM  Result Value Ref Range Status   Influenza A Positive (A) Negative Final   Influenza B Negative Negative Final    Comment: If the test is negative for the presence of influenza A or influenza  B antigen, infection due to influenza cannot be ruled-out because the antigen present in the sample may be below the detection limit of the test. It is recommended that these results be confirmed by viral culture or an FDA-cleared influenza A and B molecular assay.          Radiology Studies: Ct Head Wo Contrast  Result Date: 03/15/2016 CLINICAL DATA:  History of lung carcinoma, now with worsening weakness over the last few days, possible flu EXAM: CT HEAD WITHOUT CONTRAST TECHNIQUE: Contiguous axial images were obtained from the base of the skull through the vertex without intravenous contrast. COMPARISON:  CT  brain scan of 05/21/2011 FINDINGS: Brain: The ventricular system is prominent, as are the cortical sulci consistent with diffuse atrophy. Moderate small vessel ischemic changes noted throughout the periventricular white matter. The septum is midline in position. The fourth ventricle and basilar cisterns are unremarkable. No hemorrhage, mass lesion, or acute infarction is seen. Vascular: No vascular abnormality is seen on this unenhanced study. Skull: On bone window images, no calvarial abnormality is noted. Sinuses/Orbits: The paranasal sinuses are well pneumatized. No abnormality of the orbits is seen. Other: None. IMPRESSION: Atrophy. Moderate small vessel ischemic change. No acute intracranial abnormality. Electronically Signed   By: Ivar Drape M.D.   On: 03/15/2016 16:56        Scheduled Meds: . aspirin  81 mg Oral Daily  . docusate sodium  100 mg Oral BID  . doxazosin  2 mg Oral Daily  . enoxaparin (LOVENOX) injection  30 mg Subcutaneous Q24H  . lisinopril  10 mg Oral Daily  . oseltamivir  30 mg Oral Daily  . predniSONE  40 mg Oral Q breakfast  . senna-docusate  2 tablet Oral QHS  . sodium chloride  500 mL Intravenous Once   Continuous Infusions:    LOS: 2 days    Time spent: 30 minutes    Loretha Stapler, MD [facility] Pager [phone]  If 7PM-7AM,  please contact night-coverage [email or web address] Password [email or web address] 03/17/2016, 2:50 PM

## 2016-03-18 LAB — CBC WITH DIFFERENTIAL/PLATELET
Basophils Absolute: 0 10*3/uL (ref 0.0–0.1)
Basophils Absolute: 0 10*3/uL (ref 0.0–0.1)
Basophils Relative: 0 %
Basophils Relative: 0 %
Eosinophils Absolute: 0 10*3/uL (ref 0.0–0.7)
Eosinophils Absolute: 0 10*3/uL (ref 0.0–0.7)
Eosinophils Relative: 0 %
Eosinophils Relative: 0 %
HCT: 27.8 % — ABNORMAL LOW (ref 39.0–52.0)
HCT: 29.8 % — ABNORMAL LOW (ref 39.0–52.0)
Hemoglobin: 9 g/dL — ABNORMAL LOW (ref 13.0–17.0)
Hemoglobin: 9.5 g/dL — ABNORMAL LOW (ref 13.0–17.0)
Lymphocytes Relative: 19 %
Lymphocytes Relative: 11 %
Lymphs Abs: 0.7 10*3/uL (ref 0.7–4.0)
Lymphs Abs: 0.5 10*3/uL — ABNORMAL LOW (ref 0.7–4.0)
MCH: 30.7 pg (ref 26.0–34.0)
MCH: 30.5 pg (ref 26.0–34.0)
MCHC: 32.4 g/dL (ref 30.0–36.0)
MCHC: 31.9 g/dL (ref 30.0–36.0)
MCV: 94.9 fL (ref 78.0–100.0)
MCV: 95.8 fL (ref 78.0–100.0)
Monocytes Absolute: 0.4 10*3/uL (ref 0.1–1.0)
Monocytes Absolute: 0.4 10*3/uL (ref 0.1–1.0)
Monocytes Relative: 10 %
Monocytes Relative: 8 %
Neutro Abs: 2.7 10*3/uL (ref 1.7–7.7)
Neutro Abs: 3.6 10*3/uL (ref 1.7–7.7)
Neutrophils Relative %: 70 %
Neutrophils Relative %: 80 %
Platelets: 87 10*3/uL — ABNORMAL LOW (ref 150–400)
Platelets: 108 10*3/uL — ABNORMAL LOW (ref 150–400)
RBC: 2.93 MIL/uL — ABNORMAL LOW (ref 4.22–5.81)
RBC: 3.11 MIL/uL — ABNORMAL LOW (ref 4.22–5.81)
RDW: 14 % (ref 11.5–15.5)
RDW: 14.1 % (ref 11.5–15.5)
WBC: 3.9 10*3/uL — ABNORMAL LOW (ref 4.0–10.5)
WBC: 4.5 10*3/uL (ref 4.0–10.5)

## 2016-03-18 LAB — BASIC METABOLIC PANEL
BUN: 30 mg/dL — ABNORMAL HIGH (ref 6–20)
CO2: 27 mmol/L (ref 22–32)
Calcium: 8.6 mg/dL — ABNORMAL LOW (ref 8.9–10.3)
Chloride: 106 mmol/L (ref 101–111)
Creatinine, Ser: 1.36 mg/dL — ABNORMAL HIGH (ref 0.61–1.24)
GFR calc Af Amer: 53 mL/min — ABNORMAL LOW (ref >60)
GFR calc non Af Amer: 46 mL/min — ABNORMAL LOW (ref >60)
Glucose, Bld: 116 mg/dL — ABNORMAL HIGH (ref 65–99)
Potassium: 4.3 mmol/L (ref 3.5–5.1)
Sodium: 135 mmol/L (ref 135–145)

## 2016-03-18 LAB — FOLATE: Folate: 25 ng/mL (ref >5.9)

## 2016-03-18 LAB — RETICULOCYTES
RBC.: 2.97 MIL/uL — ABNORMAL LOW (ref 4.22–5.81)
Retic Count, Absolute: 23.8 10*3/uL (ref 19.0–186.0)
Retic Ct Pct: 0.8 % (ref 0.4–3.1)

## 2016-03-18 MED ORDER — PREDNISONE 20 MG PO TABS: 40.0000 mg | ORAL_TABLET | Freq: Every day | ORAL | 0 refills | Status: AC

## 2016-03-18 MED ORDER — ALBUTEROL SULFATE HFA 108 (90 BASE) MCG/ACT IN AERS: 1.0000 | INHALATION_SPRAY | RESPIRATORY_TRACT | Status: DC | PRN

## 2016-03-18 MED ORDER — IPRATROPIUM-ALBUTEROL 0.5-2.5 (3) MG/3ML IN SOLN
3.0000 mL | Freq: Four times a day (QID) | RESPIRATORY_TRACT | Status: DC
  Administered 2016-03-18: 3 mL via RESPIRATORY_TRACT
  Filled 2016-03-18: qty 3

## 2016-03-18 MED ORDER — IPRATROPIUM BROMIDE HFA 17 MCG/ACT IN AERS: 2.0000 | INHALATION_SPRAY | RESPIRATORY_TRACT | Status: DC

## 2016-03-18 MED ORDER — ALBUTEROL SULFATE (2.5 MG/3ML) 0.083% IN NEBU: 2.5000 mg | INHALATION_SOLUTION | RESPIRATORY_TRACT | 12 refills | Status: AC | PRN

## 2016-03-18 MED ORDER — OSELTAMIVIR PHOSPHATE 30 MG PO CAPS: 30.0000 mg | ORAL_CAPSULE | Freq: Two times a day (BID) | ORAL | 0 refills | Status: AC

## 2016-03-18 MED ORDER — IPRATROPIUM BROMIDE HFA 17 MCG/ACT IN AERS: 2.0000 | INHALATION_SPRAY | RESPIRATORY_TRACT | 12 refills | Status: DC | PRN

## 2016-03-18 MED ORDER — IPRATROPIUM-ALBUTEROL 0.5-2.5 (3) MG/3ML IN SOLN: 3.0000 mL | Freq: Four times a day (QID) | RESPIRATORY_TRACT | 0 refills | Status: DC

## 2016-03-18 MED ORDER — IPRATROPIUM BROMIDE 0.02 % IN SOLN
RESPIRATORY_TRACT | Status: AC
  Administered 2016-03-18: 0.5 mg
  Filled 2016-03-18: qty 2.5

## 2016-03-18 MED ORDER — ALBUTEROL SULFATE (2.5 MG/3ML) 0.083% IN NEBU: 2.5000 mg | INHALATION_SOLUTION | RESPIRATORY_TRACT | Status: DC | PRN

## 2016-03-18 MED ORDER — ALBUTEROL SULFATE HFA 108 (90 BASE) MCG/ACT IN AERS: 1.0000 | INHALATION_SPRAY | RESPIRATORY_TRACT | 0 refills | Status: DC | PRN

## 2016-03-18 NOTE — Progress Notes (Addendum)
Patient states understanding of discharge handout and instructions, son called and informed of discharge. [facility] called and informed of discharge.

## 2016-03-18 NOTE — Progress Notes (Signed)
Patient has a long history of COPD. Started him on duonebs Q6 while awake for wheezing.

## 2016-03-18 NOTE — Progress Notes (Signed)
Patient's oxygen level on room air at rest is 93% and ambulating on room air it maintained at 91%.

## 2016-03-18 NOTE — Discharge Summary (Addendum)
Physician Discharge Summary  Steven [name] YSA:[id number] DOB: [birth year] DOA: 03/15/2016  PCP: [name], [name]  Admit date: 03/15/2016 Discharge date: 03/18/2016  Admitted From:  Home Disposition:  Home (patient refused SNF placement)  Recommendations for Outpatient Follow-up:  1. Follow up with PCP in 1-2 weeks 2. Please obtain BMP/CBC in one week 3. Pick up new prescriptions at [facility] 4. Take inhalers as instructed  Home Health: Yes- PT Equipment/Devices: Nebulizer machine  Discharge Condition: Stable CODE STATUS: Full Code Diet recommendation: Heart Healthy  Brief/Interim Summary: Steven Flegel Priddyis a 83 y.o.malewith medical history significant of Lung Cancer, HLD, HTN, MI, Anxiety, that was sent to the ED office for further evaluation. Patient is mumbling and could not clearly answer questions. His daughter Steven [name] is bedside and was unable to provide much history stating patient normally takes care of himself and drives himself to [employer] everyday for breakfast. Patient states he has not felt well "for a while" but cannot elaborate. Per PCP note from earlier today "Patient explains he's had bilateral anterior leg pain for 4-5 months that's been steadily worsening. He states that over the last several months he has not been able to walk due to the pain. He also complains of one of cough, sore throat, headache, and chills. Patient takes Klonopin 3 times daily, his daughter and 5 mg Percocet this morning for pain after that he seemed very sleepy and has had vomiting. States that he lives alone and he does not feel safe going home."  Patient was found to be Influenza A positive and was started on tamiflu.  He was given steroids and breathing treatments for management of his COPD.  He was seen by PT who recommended SNF which patient refused.  He was set up with home health.  His respiratory status and mentation improved and on 03/18/16 he was alert, oriented and oxygenating well on  room air both at rest and during ambulation.  He was discharged with instructions to take medication as prescribed and to follow up with his PCP within the next week.  Discharge Diagnoses:  Principal Problem:   Influenza A Active Problems:   Anxiety state   Essential hypertension   COPD with emphysema ([place])   CKD (chronic kidney disease), stage III   Influenza    Discharge Instructions  Discharge Instructions    Call MD for:  difficulty breathing, headache or visual disturbances    Complete by:  As directed    Call MD for:  extreme fatigue    Complete by:  As directed    Call MD for:  hives    Complete by:  As directed    Call MD for:  persistant dizziness or light-headedness    Complete by:  As directed    Call MD for:  persistant nausea and vomiting    Complete by:  As directed    Call MD for:  severe uncontrolled pain    Complete by:  As directed    Call MD for:  temperature >100.4    Complete by:  As directed    Diet - low sodium heart healthy    Complete by:  As directed    Discharge instructions    Complete by:  As directed    Please pick up your prescriptions Take prescriptions as prescribed Please follow up with your doctor within 1 week   Face-to-face encounter (required for Medicare/Medicaid patients)    Complete by:  As directed    I Loretha Stapler  certify that this patient is under my care and that I, or a nurse practitioner or physician's assistant working with me, had a face-to-face encounter that meets the physician face-to-face encounter requirements with this patient on 03/18/2016. The encounter with the patient was in whole, or in part for the following medical condition(s) which is the primary reason for home health care (List medical condition): debility, dyspnea   The encounter with the patient was in whole, or in part, for the following medical condition, which is the primary reason for home health care:  dyspnea, debility   I certify that, based on my  findings, the following services are medically necessary home health services:  Physical therapy   Reason for Medically Necessary Home Health Services:   Skilled Nursing- Change/Decline in Patient Status Therapy- Therapeutic Exercises to Increase Strength and Endurance     My clinical findings support the need for the above services:  OTHER SEE COMMENTS   Further, I certify that my clinical findings support that this patient is homebound due to:  Ambulates short distances less than 300 feet   Home Health    Complete by:  As directed    To provide the following care/treatments:   PT Home Health Aide     Increase activity slowly    Complete by:  As directed      Allergies as of 03/18/2016   No Known Allergies     Medication List    TAKE these medications   albuterol (2.5 MG/3ML) 0.083% nebulizer solution Commonly known as:  PROVENTIL Take 3 mLs (2.5 mg total) by nebulization every 2 (two) hours as needed for wheezing or shortness of breath.   albuterol 108 (90 Base) MCG/ACT inhaler Commonly known as:  PROVENTIL HFA;VENTOLIN HFA Inhale 1-2 puffs into the lungs every 4 (four) hours as needed for wheezing or shortness of breath.   aspirin 81 MG tablet Take 81 mg by mouth daily.   clonazePAM 1 MG tablet Commonly known as:  KLONOPIN Take 1 tablet (1 mg total) by mouth 3 (three) times daily as needed.   doxazosin 2 MG tablet Commonly known as:  CARDURA Take 1 tablet (2 mg total) by mouth daily.   ipratropium 17 MCG/ACT inhaler Commonly known as:  ATROVENT HFA Inhale 2 puffs into the lungs every 4 (four) hours as needed for wheezing.   ipratropium-albuterol 0.5-2.5 (3) MG/3ML Soln Commonly known as:  DUONEB Take 3 mLs by nebulization every 6 (six) hours.   lisinopril 10 MG tablet Commonly known as:  PRINIVIL,ZESTRIL Take 1 tablet (10 mg total) by mouth daily.   multivitamins with iron Tabs tablet Take 1 tablet by mouth daily.   oseltamivir 30 MG capsule Commonly known  as:  TAMIFLU Take 1 capsule (30 mg total) by mouth 2 (two) times daily.   predniSONE 20 MG tablet Commonly known as:  DELTASONE Take 2 tablets (40 mg total) by mouth daily with breakfast. Start taking on:  03/19/2016   SLEEP AID PO Take 1 tablet by mouth at bedtime.   STOOL SOFTENER & LAXATIVE PO            Durable Medical Equipment        Start     Ordered   03/18/16 1011  For home use only DME Nebulizer machine  Once    Question:  Patient needs a nebulizer to treat with the following condition  Answer:  COPD (chronic obstructive pulmonary disease) ([place])   03/18/16 1010  Follow-up Information    [facility] Follow up.   Specialty:  Home Health Services Contact information: [place] [address] [phone]        [place], [name]. Schedule an appointment as soon as possible for a visit in 1 week(s).   Specialty:  Family Medicine Contact information: [place] [address] [phone]          No Known Allergies  Consultations: PT CM   Procedures/Studies: Dg Chest 2 View  Result Date: 03/15/2016 CLINICAL DATA:  Cough, fever, leg aching. History of lung malignancy, former smoker. EXAM: CHEST  2 VIEW COMPARISON:  PA and lateral chest x-ray dated February 03, 2016. FINDINGS: The lungs remain hyperinflated with hemidiaphragm flattening. There is no focal infiltrate. There is no pleural effusion. No pulmonary parenchymal masses are observed. The heart and pulmonary vascularity are normal. There are post CABG changes. There is calcification in the wall of the aortic arch. The bony thorax exhibits no acute abnormality. IMPRESSION: COPD.  Previous CABG.  No pneumonia nor CHF. Thoracic aortic atherosclerosis. Electronically Signed   By: David  Martinique M.D.   On: 03/15/2016 12:00   Ct Head Wo Contrast  Result Date: 03/15/2016 CLINICAL DATA:  History of lung carcinoma, now with worsening weakness  over the last few days, possible flu EXAM: CT HEAD WITHOUT CONTRAST TECHNIQUE: Contiguous axial images were obtained from the base of the skull through the vertex without intravenous contrast. COMPARISON:  CT brain scan of 05/21/2011 FINDINGS: Brain: The ventricular system is prominent, as are the cortical sulci consistent with diffuse atrophy. Moderate small vessel ischemic changes noted throughout the periventricular white matter. The septum is midline in position. The fourth ventricle and basilar cisterns are unremarkable. No hemorrhage, mass lesion, or acute infarction is seen. Vascular: No vascular abnormality is seen on this unenhanced study. Skull: On bone window images, no calvarial abnormality is noted. Sinuses/Orbits: The paranasal sinuses are well pneumatized. No abnormality of the orbits is seen. Other: None. IMPRESSION: Atrophy. Moderate small vessel ischemic change. No acute intracranial abnormality. Electronically Signed   By: Ivar Drape M.D.   On: 03/15/2016 16:56       Subjective: Patient reports that he feels ready to go home.  Mentions that he feels comfortable taking care of things at home.  Says he has Advair at home but doesn't use it because it doesn't seem to help him.  Denies feeling short of breath.  Sleep well last night.  Reports that he was able to get up and walk with nursing staff.  Discharge Exam: Vitals:   03/18/16 0614 03/18/16 0852  BP: (!) 136/44 (!) 142/100  Pulse: 76   Resp: 20   Temp: 98.6 F (37 C)    Vitals:   03/18/16 0211 03/18/16 0614 03/18/16 0755 03/18/16 0852  BP:  (!) 136/44  (!) 142/100  Pulse:  76    Resp:  20    Temp:  98.6 F (37 C)    TempSrc:  Oral    SpO2: 98% 97% 98%   Weight:      Height:        General: Pt is alert, awake, not in acute distress Cardiovascular: RRR, S1/S2 +, no rubs, no gallops Respiratory: CTA bilaterally, few expiratory wheezes, no rhonchi Abdominal: Soft, NT, ND, bowel sounds + Extremities: no edema, no  cyanosis    The results of significant diagnostics from this hospitalization (including imaging, microbiology, ancillary and laboratory)  are listed below for reference.     Microbiology: Recent Results (from the past 240 hour(s))  Veritor Flu A/B Waived     Status: Abnormal   Collection Time: 03/15/16 10:03 AM  Result Value Ref Range Status   Influenza A Positive (A) Negative Final   Influenza B Negative Negative Final    Comment: If the test is negative for the presence of influenza A or influenza B antigen, infection due to influenza cannot be ruled-out because the antigen present in the sample may be below the detection limit of the test. It is recommended that these results be confirmed by viral culture or an FDA-cleared influenza A and B molecular assay.      Labs: BNP (last 3 results) No results for input(s): BNP in the last 8760 hours. Basic Metabolic Panel:  Recent Labs Lab 03/15/16 1143 03/16/16 0545 03/17/16 0624 03/17/16 1116 03/18/16 0803  NA 136 137 138 138 135  K 4.5 4.9 5.3* 5.4* 4.3  CL 104 108 108 107 106  CO2 '24 25 26 27 27  '$ GLUCOSE 118* 96 106* 117* 116*  BUN 45* 41* 32* 33* 30*  CREATININE 2.74* 2.07* 1.71* 1.58* 1.36*  CALCIUM 8.8* 8.4* 8.8* 9.1 8.6*   Liver Function Tests:  Recent Labs Lab 03/15/16 1143  AST 29  ALT 19  ALKPHOS 49  BILITOT 0.7  PROT 6.9  ALBUMIN 3.5   No results for input(s): LIPASE, AMYLASE in the last 168 hours.  Recent Labs Lab 03/15/16 1955  AMMONIA 26   CBC:  Recent Labs Lab 03/15/16 1143 03/16/16 0545 03/17/16 0624 03/18/16 0803 03/18/16 1034  WBC 6.3 4.4 4.6 3.9* 4.5  NEUTROABS 5.3  --  3.6 2.7 3.6  HGB 10.2* 9.1* 9.8* 9.0* 9.5*  HCT 31.9* 28.6* 30.8* 27.8* 29.8*  MCV 95.5 95.7 96.0 94.9 95.8  PLT 106* 100* 109* 87* 108*   Cardiac Enzymes: No results for input(s): CKTOTAL, CKMB, CKMBINDEX, TROPONINI in the last 168 hours. BNP: Invalid input(s): POCBNP CBG:  Recent Labs Lab 03/15/16 1625   GLUCAP 117*   D-Dimer No results for input(s): DDIMER in the last 72 hours. Hgb A1c No results for input(s): HGBA1C in the last 72 hours. Lipid Profile No results for input(s): CHOL, HDL, LDLCALC, TRIG, CHOLHDL, LDLDIRECT in the last 72 hours. Thyroid function studies No results for input(s): TSH, T4TOTAL, T3FREE, THYROIDAB in the last 72 hours.  Invalid input(s): FREET3 Anemia work up  Recent Labs  03/18/16 0900  RETICCTPCT 0.8   Urinalysis    Component Value Date/Time   COLORURINE YELLOW 03/16/2016 2018   APPEARANCEUR CLEAR 03/16/2016 2018   LABSPEC 1.013 03/16/2016 2018   PHURINE 5.0 03/16/2016 2018   GLUCOSEU NEGATIVE 03/16/2016 2018   HGBUR MODERATE (A) 03/16/2016 2018   [place] NEGATIVE 03/16/2016 2018   KETONESUR NEGATIVE 03/16/2016 2018   PROTEINUR NEGATIVE 03/16/2016 2018   UROBILINOGEN 0.2 11/05/2011 1456   NITRITE NEGATIVE 03/16/2016 2018   LEUKOCYTESUR NEGATIVE 03/16/2016 2018   Sepsis Labs Invalid input(s): PROCALCITONIN,  WBC,  LACTICIDVEN Microbiology Recent Results (from the past 240 hour(s))  Veritor Flu A/B Waived     Status: Abnormal   Collection Time: 03/15/16 10:03 AM  Result Value Ref Range Status   Influenza A Positive (A) Negative Final   Influenza B Negative Negative Final    Comment: If the test is negative for the presence of influenza A or influenza B antigen, infection due to influenza cannot be ruled-out because the antigen present in  the sample may be below the detection limit of the test. It is recommended that these results be confirmed by viral culture or an FDA-cleared influenza A and B molecular assay.      Time coordinating discharge: Over 35 minutes  SIGNED:   Loretha Stapler, MD  [facility] 03/18/2016, 1:07 PM Pager [phone] If 7PM-7AM, please contact night-coverage [email or web address] Password [email or web address]

## 2016-03-18 NOTE — Progress Notes (Signed)
Pt's B/P dropped overnight from 181/62 to 136/44    Several breathing tx also were given.  Md informed.  Attending Md to visit this morning.

## 2016-03-19 DIAGNOSIS — I129 Hypertensive chronic kidney disease with stage 1 through stage 4 chronic kidney disease, or unspecified chronic kidney disease: Secondary | ICD-10-CM | POA: Diagnosis not present

## 2016-03-19 DIAGNOSIS — N183 Chronic kidney disease, stage 3 (moderate): Secondary | ICD-10-CM | POA: Diagnosis not present

## 2016-03-19 DIAGNOSIS — J439 Emphysema, unspecified: Secondary | ICD-10-CM | POA: Diagnosis not present

## 2016-03-19 DIAGNOSIS — I2510 Atherosclerotic heart disease of native coronary artery without angina pectoris: Secondary | ICD-10-CM | POA: Diagnosis not present

## 2016-03-19 DIAGNOSIS — J1000 Influenza due to other identified influenza virus with unspecified type of pneumonia: Secondary | ICD-10-CM | POA: Diagnosis not present

## 2016-03-19 DIAGNOSIS — F411 Generalized anxiety disorder: Secondary | ICD-10-CM | POA: Diagnosis not present

## 2016-03-19 LAB — IRON AND TIBC
Iron: 19 ug/dL — ABNORMAL LOW (ref 45–182)
Saturation Ratios: 7 % — ABNORMAL LOW (ref 17.9–39.5)
TIBC: 263 ug/dL (ref 250–450)
UIBC: 244 ug/dL

## 2016-03-19 LAB — VITAMIN B12: Vitamin B-12: 1470 pg/mL — ABNORMAL HIGH (ref 180–914)

## 2016-03-19 LAB — FERRITIN: Ferritin: 128 ng/mL (ref 24–336)

## 2016-03-20 ENCOUNTER — Telehealth: Payer: Self-pay | Admitting: *Deleted

## 2016-03-20 DIAGNOSIS — J439 Emphysema, unspecified: Secondary | ICD-10-CM | POA: Diagnosis not present

## 2016-03-20 DIAGNOSIS — I2510 Atherosclerotic heart disease of native coronary artery without angina pectoris: Secondary | ICD-10-CM | POA: Diagnosis not present

## 2016-03-20 DIAGNOSIS — F411 Generalized anxiety disorder: Secondary | ICD-10-CM | POA: Diagnosis not present

## 2016-03-20 DIAGNOSIS — I129 Hypertensive chronic kidney disease with stage 1 through stage 4 chronic kidney disease, or unspecified chronic kidney disease: Secondary | ICD-10-CM | POA: Diagnosis not present

## 2016-03-20 DIAGNOSIS — J1000 Influenza due to other identified influenza virus with unspecified type of pneumonia: Secondary | ICD-10-CM | POA: Diagnosis not present

## 2016-03-20 DIAGNOSIS — N183 Chronic kidney disease, stage 3 (moderate): Secondary | ICD-10-CM | POA: Diagnosis not present

## 2016-03-20 NOTE — Telephone Encounter (Signed)
Call Completed and Appointment Scheduled: Yes, Date: 03/29/2016 with [facility] INFORMATION Date of Discharge:03/18/2016  Discharge Facility: [facility]  Principal Discharge Diagnosis: Influenza  Patient and/or caregiver is knowledgeable of his/her condition(s) and treatment: Yes  MEDICATION RECONCILIATION Current medication list reviewed with patient:Yes  Outpatient Encounter Prescriptions as of 03/20/2016  Medication Sig  . albuterol (PROVENTIL HFA;VENTOLIN HFA) 108 (90 Base) MCG/ACT inhaler Inhale 1-2 puffs into the lungs every 4 (four) hours as needed for wheezing or shortness of breath.  [name] albuterol (PROVENTIL) (2.5 MG/3ML) 0.083% nebulizer solution Take 3 mLs (2.5 mg total) by nebulization every 2 (two) hours as needed for wheezing or shortness of breath.  [name] aspirin 81 MG tablet Take 81 mg by mouth daily.   . clonazePAM (KLONOPIN) 1 MG tablet Take 1 tablet (1 mg total) by mouth 3 (three) times daily as needed.  . doxazosin (CARDURA) 2 MG tablet Take 1 tablet (2 mg total) by mouth daily.  . Doxylamine Succinate, Sleep, (SLEEP AID PO) Take 1 tablet by mouth at bedtime.  [name] ipratropium (ATROVENT HFA) 17 MCG/ACT inhaler Inhale 2 puffs into the lungs every 4 (four) hours as needed for wheezing.  [name] ipratropium-albuterol (DUONEB) 0.5-2.5 (3) MG/3ML SOLN Take 3 mLs by nebulization every 6 (six) hours.  [name] lisinopril (PRINIVIL,ZESTRIL) 10 MG tablet Take 1 tablet (10 mg total) by mouth daily.  . Multiple Vitamins-Iron (MULTIVITAMINS WITH IRON) TABS tablet Take 1 tablet by mouth daily.  [name] oseltamivir (TAMIFLU) 30 MG capsule Take 1 capsule (30 mg total) by mouth 2 (two) times daily.  . predniSONE (DELTASONE) 20 MG tablet Take 2 tablets (40 mg total) by mouth daily with breakfast.  . Sennosides-Docusate Sodium (STOOL SOFTENER & LAXATIVE PO)    No facility-administered encounter medications on file as of 03/20/2016.     Discharge Medications reviewed and reconciled with  current medications.yes  Patient is able to obtain needed medications:Yes  ACTIVITIES OF DAILY LIVING  Is the patient able to perform his/her own ADLs: Yes.    Patient is receiving home health services: Yes.    PATIENT EDUCATION Questions/Concerns Discussed: none

## 2016-03-21 ENCOUNTER — Telehealth: Payer: Self-pay | Admitting: Nurse Practitioner

## 2016-03-21 DIAGNOSIS — J1000 Influenza due to other identified influenza virus with unspecified type of pneumonia: Secondary | ICD-10-CM | POA: Diagnosis not present

## 2016-03-21 DIAGNOSIS — J439 Emphysema, unspecified: Secondary | ICD-10-CM

## 2016-03-21 DIAGNOSIS — N183 Chronic kidney disease, stage 3 (moderate): Secondary | ICD-10-CM | POA: Diagnosis not present

## 2016-03-21 DIAGNOSIS — F411 Generalized anxiety disorder: Secondary | ICD-10-CM | POA: Diagnosis not present

## 2016-03-21 DIAGNOSIS — I2510 Atherosclerotic heart disease of native coronary artery without angina pectoris: Secondary | ICD-10-CM | POA: Diagnosis not present

## 2016-03-21 DIAGNOSIS — I129 Hypertensive chronic kidney disease with stage 1 through stage 4 chronic kidney disease, or unspecified chronic kidney disease: Secondary | ICD-10-CM | POA: Diagnosis not present

## 2016-03-21 NOTE — Telephone Encounter (Signed)
Yes that's fine to send in, thanks

## 2016-03-21 NOTE — Telephone Encounter (Signed)
RX faxed to [facility]. Patient aware.

## 2016-03-21 NOTE — Telephone Encounter (Signed)
Patient was sent home with albuterol neb solution  From [facility] but does not have a neb machine. Needs rx for machine. Is this okay to do? Covering PCP

## 2016-03-26 DIAGNOSIS — J439 Emphysema, unspecified: Secondary | ICD-10-CM | POA: Diagnosis not present

## 2016-03-26 DIAGNOSIS — I2510 Atherosclerotic heart disease of native coronary artery without angina pectoris: Secondary | ICD-10-CM | POA: Diagnosis not present

## 2016-03-26 DIAGNOSIS — I129 Hypertensive chronic kidney disease with stage 1 through stage 4 chronic kidney disease, or unspecified chronic kidney disease: Secondary | ICD-10-CM | POA: Diagnosis not present

## 2016-03-26 DIAGNOSIS — J1000 Influenza due to other identified influenza virus with unspecified type of pneumonia: Secondary | ICD-10-CM | POA: Diagnosis not present

## 2016-03-26 DIAGNOSIS — N183 Chronic kidney disease, stage 3 (moderate): Secondary | ICD-10-CM | POA: Diagnosis not present

## 2016-03-26 DIAGNOSIS — F411 Generalized anxiety disorder: Secondary | ICD-10-CM | POA: Diagnosis not present

## 2016-03-27 DIAGNOSIS — I129 Hypertensive chronic kidney disease with stage 1 through stage 4 chronic kidney disease, or unspecified chronic kidney disease: Secondary | ICD-10-CM | POA: Diagnosis not present

## 2016-03-27 DIAGNOSIS — N183 Chronic kidney disease, stage 3 (moderate): Secondary | ICD-10-CM | POA: Diagnosis not present

## 2016-03-27 DIAGNOSIS — F411 Generalized anxiety disorder: Secondary | ICD-10-CM | POA: Diagnosis not present

## 2016-03-27 DIAGNOSIS — J1000 Influenza due to other identified influenza virus with unspecified type of pneumonia: Secondary | ICD-10-CM | POA: Diagnosis not present

## 2016-03-27 DIAGNOSIS — I2510 Atherosclerotic heart disease of native coronary artery without angina pectoris: Secondary | ICD-10-CM | POA: Diagnosis not present

## 2016-03-27 DIAGNOSIS — J439 Emphysema, unspecified: Secondary | ICD-10-CM | POA: Diagnosis not present

## 2016-03-29 ENCOUNTER — Encounter: Payer: Self-pay | Admitting: Nurse Practitioner

## 2016-03-29 ENCOUNTER — Ambulatory Visit (INDEPENDENT_AMBULATORY_CARE_PROVIDER_SITE_OTHER): Payer: Medicare HMO | Admitting: Nurse Practitioner

## 2016-03-29 ENCOUNTER — Ambulatory Visit (INDEPENDENT_AMBULATORY_CARE_PROVIDER_SITE_OTHER): Payer: Medicare HMO

## 2016-03-29 VITALS — BP 132/57 | HR 75 | Temp 98.1°F | Ht 67.0 in | Wt 133.0 lb

## 2016-03-29 DIAGNOSIS — Z09 Encounter for follow-up examination after completed treatment for conditions other than malignant neoplasm: Secondary | ICD-10-CM

## 2016-03-29 DIAGNOSIS — I129 Hypertensive chronic kidney disease with stage 1 through stage 4 chronic kidney disease, or unspecified chronic kidney disease: Secondary | ICD-10-CM | POA: Diagnosis not present

## 2016-03-29 DIAGNOSIS — F411 Generalized anxiety disorder: Secondary | ICD-10-CM | POA: Diagnosis not present

## 2016-03-29 DIAGNOSIS — J101 Influenza due to other identified influenza virus with other respiratory manifestations: Secondary | ICD-10-CM | POA: Diagnosis not present

## 2016-03-29 DIAGNOSIS — J439 Emphysema, unspecified: Secondary | ICD-10-CM | POA: Diagnosis not present

## 2016-03-29 DIAGNOSIS — J1000 Influenza due to other identified influenza virus with unspecified type of pneumonia: Secondary | ICD-10-CM | POA: Diagnosis not present

## 2016-03-29 DIAGNOSIS — N183 Chronic kidney disease, stage 3 (moderate): Secondary | ICD-10-CM | POA: Diagnosis not present

## 2016-03-29 DIAGNOSIS — I2510 Atherosclerotic heart disease of native coronary artery without angina pectoris: Secondary | ICD-10-CM | POA: Diagnosis not present

## 2016-03-29 NOTE — Progress Notes (Signed)
   Subjective:    Patient ID: Steven [name], male    DOB: Jun 11, 1928, 83 y.o.   MRN: [id number]  HPI Today's visit is for Transitional Care Management.  The patient was discharged from [facility] on 03/19/11 with a primary diagnosis of influenza.   Contact with the patient and/or caregiver, by a clinical staff member, was made on 03/20/16 and was documented as a telephone encounter within the EMR.  Through chart review and discussion with the patient I have determined that management of their condition is of low complexity.   Patient was in hospital for 4 days with flu. Thought he was going to die, because he felt so bad. He is much better. Denies fever, and cough.          Review of Systems  Constitutional: Negative for appetite change, chills and fever.  HENT: Negative.   Respiratory: Negative for cough and shortness of breath.   Cardiovascular: Negative.   Gastrointestinal: Negative.   Genitourinary: Negative.   Neurological: Negative.   Psychiatric/Behavioral: Negative.   All other systems reviewed and are negative.      Objective:   Physical Exam  Constitutional: He is oriented to person, place, and time. He appears well-developed and well-nourished. No distress.  Cardiovascular: Normal rate, regular rhythm and normal heart sounds.   Pulmonary/Chest: Effort normal. He has rales (right lower lobe with diminished breath sounds).  Neurological: He is alert and oriented to person, place, and time.  Skin: Skin is warm.  Psychiatric: He has a normal mood and affect. His behavior is normal. Judgment and thought content normal.   BP (!) 132/57   Pulse 75   Temp 98.1 F (36.7 C) (Oral)   Ht '5\' 7"'$  (1.702 m)   Wt 133 lb (60.3 kg)   BMI 20.83 kg/m     chest xray- no changes since last visit-Preliminary reading by Ronnald Collum, [name]  [facility]  Assessment & Plan:  1. Hospital discharge follow-up Hospital records reviewed Force fluids Rest  RT prn - DG Chest 2 View;  Future  Mary-Margaret Hassell Done, [name]

## 2016-04-02 DIAGNOSIS — J1000 Influenza due to other identified influenza virus with unspecified type of pneumonia: Secondary | ICD-10-CM | POA: Diagnosis not present

## 2016-04-02 DIAGNOSIS — I129 Hypertensive chronic kidney disease with stage 1 through stage 4 chronic kidney disease, or unspecified chronic kidney disease: Secondary | ICD-10-CM | POA: Diagnosis not present

## 2016-04-02 DIAGNOSIS — I2510 Atherosclerotic heart disease of native coronary artery without angina pectoris: Secondary | ICD-10-CM | POA: Diagnosis not present

## 2016-04-02 DIAGNOSIS — F411 Generalized anxiety disorder: Secondary | ICD-10-CM | POA: Diagnosis not present

## 2016-04-02 DIAGNOSIS — N183 Chronic kidney disease, stage 3 (moderate): Secondary | ICD-10-CM | POA: Diagnosis not present

## 2016-04-02 DIAGNOSIS — J439 Emphysema, unspecified: Secondary | ICD-10-CM | POA: Diagnosis not present

## 2016-04-03 DIAGNOSIS — F411 Generalized anxiety disorder: Secondary | ICD-10-CM | POA: Diagnosis not present

## 2016-04-03 DIAGNOSIS — N183 Chronic kidney disease, stage 3 (moderate): Secondary | ICD-10-CM | POA: Diagnosis not present

## 2016-04-03 DIAGNOSIS — J439 Emphysema, unspecified: Secondary | ICD-10-CM | POA: Diagnosis not present

## 2016-04-03 DIAGNOSIS — I2510 Atherosclerotic heart disease of native coronary artery without angina pectoris: Secondary | ICD-10-CM | POA: Diagnosis not present

## 2016-04-03 DIAGNOSIS — I129 Hypertensive chronic kidney disease with stage 1 through stage 4 chronic kidney disease, or unspecified chronic kidney disease: Secondary | ICD-10-CM | POA: Diagnosis not present

## 2016-04-03 DIAGNOSIS — J1000 Influenza due to other identified influenza virus with unspecified type of pneumonia: Secondary | ICD-10-CM | POA: Diagnosis not present

## 2016-04-05 DIAGNOSIS — J439 Emphysema, unspecified: Secondary | ICD-10-CM | POA: Diagnosis not present

## 2016-04-05 DIAGNOSIS — I129 Hypertensive chronic kidney disease with stage 1 through stage 4 chronic kidney disease, or unspecified chronic kidney disease: Secondary | ICD-10-CM | POA: Diagnosis not present

## 2016-04-05 DIAGNOSIS — N183 Chronic kidney disease, stage 3 (moderate): Secondary | ICD-10-CM | POA: Diagnosis not present

## 2016-04-05 DIAGNOSIS — I2510 Atherosclerotic heart disease of native coronary artery without angina pectoris: Secondary | ICD-10-CM | POA: Diagnosis not present

## 2016-04-05 DIAGNOSIS — J1000 Influenza due to other identified influenza virus with unspecified type of pneumonia: Secondary | ICD-10-CM | POA: Diagnosis not present

## 2016-04-05 DIAGNOSIS — F411 Generalized anxiety disorder: Secondary | ICD-10-CM | POA: Diagnosis not present

## 2016-04-06 ENCOUNTER — Other Ambulatory Visit: Payer: Self-pay | Admitting: Pharmacist

## 2016-04-06 NOTE — Patient Outreach (Signed)
[facility]) Care Management  04/06/2016  Baldo Hufnagle Mihalik 04/25/1928 [id number]   Called patient regarding referral for medication management.  No answer. HIPAA compliant message was left on the patient's voicemail.  Plan:  I will call the patient again within 1-2 business days.  Elayne Guerin, PharmD, [place] Clinical Pharmacist [phone]

## 2016-04-09 ENCOUNTER — Other Ambulatory Visit: Payer: Self-pay | Admitting: Pharmacist

## 2016-04-09 NOTE — Patient Outreach (Signed)
[facility]) Care Management  04/09/2016  Steven [name] 1928-06-17 [id number]   [facility] to let them know [name] declined a pharmacy home visit.  The representative I spoke with said a nurse would be going out to see [name] tomorrow and she will have her call me with information about coming to the patient's home.  It was explained [facility] could not go to the patient's home without the patient's consent.    Plan:  If the [facility] Nurse goes back to the patient's home and can get the patient to agree, [facility] pharmacist will follo wup with the patient on scheduling a home visit.  Elayne Guerin, PharmD, [place] Clinical Pharmacist [phone]

## 2016-04-09 NOTE — Patient Outreach (Signed)
[facility]) Care Management  04/09/2016  Steven [name] January 06, 1929 [id number]   Patient was called regarding medication management per referral from [facility].  HIPAA identifiers were obtained. Patient has multiple medical conditions including but not limited to: hypertension, lung cancer, hyperlipidemia, history of MI and anxiety.   Unfortunately after explaining [facility] and [place], patient declined [place] home visit or further involvement.  Before getting off the phone, patient did say he previously had nursing care with [facility] and that a nurse had filled a pill box for him. Patient said he did not like the pill box and put all of his tablets back in their medication bottle.  Patient's allergies were reviewed but he did not know the names of all of his medications. He did report taking two blood pressure medications and responded yes to albuterol HFA inhaler and using nebulized medications.    Review of most recent discharge med list and the med list in the patient chart reveals the following medications:  Medications Reviewed Today    Reviewed by Elayne Guerin, [facility] (Pharmacist) on 04/09/16 at 1244  Med List Status: <None>  Medication Order Taking? Sig Documenting Provider Last Dose Status Informant  albuterol (PROVENTIL HFA;VENTOLIN HFA) 108 (90 Base) MCG/ACT inhaler [id number] Yes Inhale 1-2 puffs into the lungs every 4 (four) hours as needed for wheezing or shortness of breath. Eber Jones, MD Taking Active   albuterol (PROVENTIL) (2.5 MG/3ML) 0.083% nebulizer solution [id number] Yes Take 3 mLs (2.5 mg total) by nebulization every 2 (two) hours as needed for wheezing or shortness of breath. Eber Jones, MD Taking Active   aspirin 81 MG tablet [id number] Yes Take 81 mg by mouth daily.  Historical Provider, MD Taking Active Self           Med Note Leida Lauth Aug 04, 2015 11:44 AM) Received from: External Pharmacy   clonazePAM (KLONOPIN) 1 MG tablet [id number] Yes Take 1 tablet (1 mg total) by mouth 3 (three) times daily as needed. Mary-Margaret Hassell Done, [name] Taking Active Self  doxazosin (CARDURA) 2 MG tablet [id number] Yes Take 1 tablet (2 mg total) by mouth daily. Mary-Margaret Hassell Done, [name] Taking Active Self  Doxylamine Succinate, Sleep, (SLEEP AID PO) [id number] Yes Take 1 tablet by mouth at bedtime. Historical Provider, MD Taking Active Self  ipratropium (ATROVENT HFA) 17 MCG/ACT inhaler [id number] No Inhale 2 puffs into the lungs every 4 (four) hours as needed for wheezing. Eber Jones, MD Unknown Active   ipratropium-albuterol (DUONEB) 0.5-2.5 (3) MG/3ML SOLN [id number] Yes Take 3 mLs by nebulization every 6 (six) hours. Eber Jones, MD Taking Active   lisinopril (PRINIVIL,ZESTRIL) 10 MG tablet [id number] Yes Take 1 tablet (10 mg total) by mouth daily. Mary-Margaret Hassell Done, [name] Taking Active Self  Multiple Vitamins-Iron (MULTIVITAMINS WITH IRON) TABS tablet [id number] Yes Take 1 tablet by mouth daily. Historical Provider, MD Taking Active Self  Sennosides-Docusate Sodium (STOOL SOFTENER & LAXATIVE PO) [id number] Yes  Historical Provider, MD Taking Active Self           Med Note Leida Lauth Aug 04, 2015 11:44 AM) Received from: External Pharmacy           Plan:  1.  Call [facility] and let them know the patient declined.  2. Close pharmacy case.  Elayne Guerin, PharmD, [place] Clinical Pharmacist [phone]

## 2016-04-10 DIAGNOSIS — I129 Hypertensive chronic kidney disease with stage 1 through stage 4 chronic kidney disease, or unspecified chronic kidney disease: Secondary | ICD-10-CM | POA: Diagnosis not present

## 2016-04-10 DIAGNOSIS — N183 Chronic kidney disease, stage 3 (moderate): Secondary | ICD-10-CM | POA: Diagnosis not present

## 2016-04-10 DIAGNOSIS — J439 Emphysema, unspecified: Secondary | ICD-10-CM | POA: Diagnosis not present

## 2016-04-10 DIAGNOSIS — F411 Generalized anxiety disorder: Secondary | ICD-10-CM | POA: Diagnosis not present

## 2016-04-10 DIAGNOSIS — J1000 Influenza due to other identified influenza virus with unspecified type of pneumonia: Secondary | ICD-10-CM | POA: Diagnosis not present

## 2016-04-10 DIAGNOSIS — I2510 Atherosclerotic heart disease of native coronary artery without angina pectoris: Secondary | ICD-10-CM | POA: Diagnosis not present

## 2016-04-13 ENCOUNTER — Other Ambulatory Visit: Payer: Self-pay | Admitting: Pharmacist

## 2016-04-13 DIAGNOSIS — N183 Chronic kidney disease, stage 3 (moderate): Secondary | ICD-10-CM | POA: Diagnosis not present

## 2016-04-13 DIAGNOSIS — J1000 Influenza due to other identified influenza virus with unspecified type of pneumonia: Secondary | ICD-10-CM | POA: Diagnosis not present

## 2016-04-13 DIAGNOSIS — I129 Hypertensive chronic kidney disease with stage 1 through stage 4 chronic kidney disease, or unspecified chronic kidney disease: Secondary | ICD-10-CM | POA: Diagnosis not present

## 2016-04-13 DIAGNOSIS — J439 Emphysema, unspecified: Secondary | ICD-10-CM | POA: Diagnosis not present

## 2016-04-13 DIAGNOSIS — I2510 Atherosclerotic heart disease of native coronary artery without angina pectoris: Secondary | ICD-10-CM | POA: Diagnosis not present

## 2016-04-13 DIAGNOSIS — F411 Generalized anxiety disorder: Secondary | ICD-10-CM | POA: Diagnosis not present

## 2016-04-13 NOTE — Patient Outreach (Signed)
[facility]) Care Management  04/13/2016  Marcella Dunnaway Mahlum [birth year] [id number]   [name] from [place] called while she was in patient's home. HIPAA identifiers were obtained from patient. [name] was concerned about patient's medications. She reported being unable to get the patient to use a pill box successfully.  In addition, she said the patient does not read well and often gets his medication bottles confused.  Over conference, patient agreed to [place] Visit.  Plan:  Home visit scheduled for Tuesday April 17, 2016 to complete an in home medication review.  Elayne Guerin, PharmD, [place] Clinical Pharmacist [phone]

## 2016-04-17 ENCOUNTER — Other Ambulatory Visit: Payer: Self-pay | Admitting: Pharmacist

## 2016-04-17 ENCOUNTER — Encounter: Payer: Self-pay | Admitting: Pharmacist

## 2016-04-17 DIAGNOSIS — J1000 Influenza due to other identified influenza virus with unspecified type of pneumonia: Secondary | ICD-10-CM | POA: Diagnosis not present

## 2016-04-17 DIAGNOSIS — I2510 Atherosclerotic heart disease of native coronary artery without angina pectoris: Secondary | ICD-10-CM | POA: Diagnosis not present

## 2016-04-17 DIAGNOSIS — N183 Chronic kidney disease, stage 3 (moderate): Secondary | ICD-10-CM | POA: Diagnosis not present

## 2016-04-17 DIAGNOSIS — F411 Generalized anxiety disorder: Secondary | ICD-10-CM | POA: Diagnosis not present

## 2016-04-17 DIAGNOSIS — J439 Emphysema, unspecified: Secondary | ICD-10-CM | POA: Diagnosis not present

## 2016-04-17 DIAGNOSIS — I129 Hypertensive chronic kidney disease with stage 1 through stage 4 chronic kidney disease, or unspecified chronic kidney disease: Secondary | ICD-10-CM | POA: Diagnosis not present

## 2016-04-17 NOTE — Patient Outreach (Signed)
Steven [name]) Care Management  04/17/2016  Steven [name] 1928-10-29 [id number]   Steven [name] with Steven [name] as well as [facility] about patient's medications.  Steven [name] is patient's nurse with Steven [name]. She saw the patient after I saw him today.  She reported the patient was very agitated with her as well about coming to his home.  She reported discarding atorvastatin as she said she previously spoke with the patient's provider who confirmed, atorvastatin is no longer an active medication.  My medication review findings were shared with Steven [name].  She was informed patient case would be close from a [place] and communicated understanding.  Steven [name] has my number for any future medication concerns for the patient.  [facility] any future refills of atorvastatin for patient as it has been discontinued.    Plan:  1.  Close Pharmacy Case.  2.  Patient has my number for any future medication related questions or concerns.  Steven [name], PharmD, [place] Clinical Pharmacist [phone]

## 2016-04-17 NOTE — Patient Outreach (Addendum)
[facility]) Care Management  [place]   04/17/2016  Helder Crisafulli Koziel 12-04-1928 [id number]  Subjective: Home visit conducted at patient home per referral from [facility] for medication review.  HIPAA identifiers were obtained.  Patient is an 83 year old male with multiple medical conditions including but not limited to:  Hypertension, COPD with emphysema, BPH, CKD stage III, hyperlipidemia, anxiety, and erectile dysfunction.  He was hospitalized 03/15/16 for influenza  Patient was very agitated about having a visit today to review his medications.  He verbally agreed to have the visit today but explicitly said he did not want anyone else coming to his home about medications or health care in general.    Patient reported managing his medications himself.  He prefers to use medication bottles versus a pill box.  He said the pills were hard to get out of the pill box and unnecessary for him as he lays his medications out on the kitchen counter for the day every morning.    Patient is taking five medications daily.  All of his other medications are PRN.  It is unclear if the patient can read.     Objective:   Encounter Medications: Outpatient Encounter Prescriptions as of 04/17/2016  Medication Sig Note  . aspirin 81 MG tablet Take 81 mg by mouth daily.  08/04/2015: Received from: External Pharmacy  . atorvastatin (LIPITOR) 10 MG tablet Take 10 mg by mouth daily. 04/17/2016: Was discontinued during the 03/15/16 PCP visit but patient reported still taking  . clonazePAM (KLONOPIN) 1 MG tablet Take 1 tablet (1 mg total) by mouth 3 (three) times daily as needed.   . doxazosin (CARDURA) 2 MG tablet Take 1 tablet (2 mg total) by mouth daily.   . Doxylamine Succinate, Sleep, (SLEEP AID PO) Take 1 tablet by mouth at bedtime.   [name] ipratropium-albuterol (DUONEB) 0.5-2.5 (3) MG/3ML SOLN Take 3 mLs by nebulization every 6 (six) hours.   [name] lisinopril (PRINIVIL,ZESTRIL) 10 MG  tablet Take 1 tablet (10 mg total) by mouth daily.   . Multiple Vitamins-Iron (MULTIVITAMINS WITH IRON) TABS tablet Take 1 tablet by mouth daily.   . naproxen sodium (ALEVE) 220 MG tablet Take 220 mg by mouth daily. 04/17/2016: Patient reports taking Aleve 2-3 times per week. (once daily)  . Sennosides-Docusate Sodium (STOOL SOFTENER & LAXATIVE PO)  04/17/2016: Uses PRN  . albuterol (PROVENTIL HFA;VENTOLIN HFA) 108 (90 Base) MCG/ACT inhaler Inhale 1-2 puffs into the lungs every 4 (four) hours as needed for wheezing or shortness of breath. (Patient not taking: Reported on 04/17/2016)   . albuterol (PROVENTIL) (2.5 MG/3ML) 0.083% nebulizer solution Take 3 mLs (2.5 mg total) by nebulization every 2 (two) hours as needed for wheezing or shortness of breath. 04/17/2016: PRN Only  . ipratropium (ATROVENT HFA) 17 MCG/ACT inhaler Inhale 2 puffs into the lungs every 4 (four) hours as needed for wheezing. (Patient not taking: Reported on 04/17/2016)    No facility-administered encounter medications on file as of 04/17/2016.     Functional Status: In your present state of health, do you have any difficulty performing the following activities: 03/15/2016  Hearing? N  Vision? N  Difficulty concentrating or making decisions? N  Walking or climbing stairs? N  Dressing or bathing? N  Doing errands, shopping? N  Some recent data might be hidden    Fall/Depression Screening: PHQ 2/9 Scores 03/29/2016 03/15/2016 02/03/2016 08/04/2015 01/20/2015 06/18/2014 03/17/2014  PHQ - 2 Score 0 0 0 0 0 0  0    Assessment: Medications and allergies were reviewed with the patient.  His actual medication bottles were inspected.    Drugs sorted by system:  Neurologic/Psychologic: Clonazepam Doxyalamine (OTC sleep aid--PRN)  Cardiovascular: Doxazosin Lisinopril Aspirin Atorvastatin-discontinued 03/15/16 most likely due to bilateral leg pain patient had complained about.  Patient had atorvastatin had his home and reported still  taking it.  Pulmonary/Allergy: Albuterol Nebulizer solution-Uses PRN Albuterol HFA-patient did not have in his possession and reported that he does not use Ipratropium HFA-patient did not have in his possession and reported that he does not use Ipratropium/Albuterol nebulizer solution-Uses PRN  Pain: Aleve (was not on medication list but patient had in his possession and said he takes 3 times per week)  Vitamins/Minerals: Multivitamin with Iron  Gastrointestinal: Sennosides/Docusate-PRN  Medication Review Findings:   Although patient does not use a pill box, he does appear to have a system for taking his medications daily.  He recognized each of his chronic medications ( clonazepam, lisinopril, doxazosin, aspirin, and atorvastatin) by their shape and color.     1.  Omitted medications:  A-Atorvastatin-was discontinued at the PCP visit 03/15/16.  It appears the atorvastatin was discontinued due to patient complaints of bilateral leg pain.  Patient reports continuing to take atorvastatin because he has at least a two month supply of tablets left in his possession.   Most recent lipids (12/17)-- LDL 39,  HDL 45, TG 163  I spoke with patient's [place], [name].  She said she spoke to patient's PCP office and flushed the remaining atorvastatin tablets today while at the patient's home.  [facility] any future refills of atorvastatin.  If after monitoring lipids, statin therapy is needed, could consider switching to lower dose pravastatin daily or even trying rosuvastatin low dose once weekly.  B-Aleve-found on the patient's kitchen counter and the patient admitted to taking Aleve two to three times per week.  Patient has CKD stage III. His most recent Scr was 1.36 (2/18), and his calculated creatinine clearance is 34 ml/min.  Recommend discontinuing aleve and suggesting acetaminophen for the patient's pain.      2.  Medication List  Discrepancies:  Inhalers-patient did not have Ipratropium HFA or albuterol HFA in his possession and said he does not use any inhalers.  He did have a small amount of albuterol nebulizer solution and several boxes of ipratropium/albuterol nebulizer solution.  Patient was educated about one being a singular agent and one having two ingredients. In addition, patient was educated they are both short acting bronchodilators that help with shortness of breath and wheezing.    If deemed therapeutically appropriate, both rescue inhalers could be removed from the patient's medication list to help simplify his regimen.  Also, albuterol nebulizer solution could be removed and patient left on the combination ipratropium/albuterol nebulizer solution. ( He had quite a few boxes of ipratropium/albuterol in his possession).   Plan:  1.  Route note to patient's PCP.  2.  Spoke to [name] [name] Nurse working with Mr. Thumm)--discussed medication review findings.  3.  Close the pharmacy case since patient is not interested in receiving further care.  Elayne Guerin, PharmD, [place] Clinical Pharmacist [phone]

## 2016-04-20 DIAGNOSIS — J1000 Influenza due to other identified influenza virus with unspecified type of pneumonia: Secondary | ICD-10-CM | POA: Diagnosis not present

## 2016-04-20 DIAGNOSIS — N183 Chronic kidney disease, stage 3 (moderate): Secondary | ICD-10-CM | POA: Diagnosis not present

## 2016-04-20 DIAGNOSIS — F411 Generalized anxiety disorder: Secondary | ICD-10-CM | POA: Diagnosis not present

## 2016-04-20 DIAGNOSIS — I129 Hypertensive chronic kidney disease with stage 1 through stage 4 chronic kidney disease, or unspecified chronic kidney disease: Secondary | ICD-10-CM | POA: Diagnosis not present

## 2016-04-20 DIAGNOSIS — I2510 Atherosclerotic heart disease of native coronary artery without angina pectoris: Secondary | ICD-10-CM | POA: Diagnosis not present

## 2016-04-20 DIAGNOSIS — J439 Emphysema, unspecified: Secondary | ICD-10-CM | POA: Diagnosis not present

## 2016-04-25 DIAGNOSIS — J1000 Influenza due to other identified influenza virus with unspecified type of pneumonia: Secondary | ICD-10-CM | POA: Diagnosis not present

## 2016-04-25 DIAGNOSIS — J439 Emphysema, unspecified: Secondary | ICD-10-CM | POA: Diagnosis not present

## 2016-04-25 DIAGNOSIS — N183 Chronic kidney disease, stage 3 (moderate): Secondary | ICD-10-CM | POA: Diagnosis not present

## 2016-04-25 DIAGNOSIS — I129 Hypertensive chronic kidney disease with stage 1 through stage 4 chronic kidney disease, or unspecified chronic kidney disease: Secondary | ICD-10-CM | POA: Diagnosis not present

## 2016-04-25 DIAGNOSIS — F411 Generalized anxiety disorder: Secondary | ICD-10-CM | POA: Diagnosis not present

## 2016-04-25 DIAGNOSIS — I2510 Atherosclerotic heart disease of native coronary artery without angina pectoris: Secondary | ICD-10-CM | POA: Diagnosis not present

## 2016-05-01 ENCOUNTER — Other Ambulatory Visit: Payer: Self-pay | Admitting: Nurse Practitioner

## 2016-05-01 DIAGNOSIS — I2510 Atherosclerotic heart disease of native coronary artery without angina pectoris: Secondary | ICD-10-CM | POA: Diagnosis not present

## 2016-05-01 DIAGNOSIS — J1000 Influenza due to other identified influenza virus with unspecified type of pneumonia: Secondary | ICD-10-CM | POA: Diagnosis not present

## 2016-05-01 DIAGNOSIS — N183 Chronic kidney disease, stage 3 (moderate): Secondary | ICD-10-CM | POA: Diagnosis not present

## 2016-05-01 DIAGNOSIS — I129 Hypertensive chronic kidney disease with stage 1 through stage 4 chronic kidney disease, or unspecified chronic kidney disease: Secondary | ICD-10-CM | POA: Diagnosis not present

## 2016-05-01 DIAGNOSIS — F411 Generalized anxiety disorder: Secondary | ICD-10-CM | POA: Diagnosis not present

## 2016-05-01 DIAGNOSIS — J439 Emphysema, unspecified: Secondary | ICD-10-CM | POA: Diagnosis not present

## 2016-05-02 NOTE — Telephone Encounter (Signed)
[facility] 

## 2016-05-02 NOTE — Telephone Encounter (Signed)
Please call in klonopin with 1 refills 

## 2016-05-04 ENCOUNTER — Ambulatory Visit (INDEPENDENT_AMBULATORY_CARE_PROVIDER_SITE_OTHER): Payer: Medicare HMO | Admitting: Family Medicine

## 2016-05-04 DIAGNOSIS — Z955 Presence of coronary angioplasty implant and graft: Secondary | ICD-10-CM | POA: Diagnosis not present

## 2016-05-04 DIAGNOSIS — J439 Emphysema, unspecified: Secondary | ICD-10-CM | POA: Diagnosis not present

## 2016-05-04 DIAGNOSIS — F411 Generalized anxiety disorder: Secondary | ICD-10-CM

## 2016-05-04 DIAGNOSIS — J1000 Influenza due to other identified influenza virus with unspecified type of pneumonia: Secondary | ICD-10-CM

## 2016-05-04 DIAGNOSIS — I2510 Atherosclerotic heart disease of native coronary artery without angina pectoris: Secondary | ICD-10-CM | POA: Diagnosis not present

## 2016-05-04 DIAGNOSIS — Z87891 Personal history of nicotine dependence: Secondary | ICD-10-CM | POA: Diagnosis not present

## 2016-05-04 DIAGNOSIS — Z7982 Long term (current) use of aspirin: Secondary | ICD-10-CM | POA: Diagnosis not present

## 2016-05-04 DIAGNOSIS — N183 Chronic kidney disease, stage 3 (moderate): Secondary | ICD-10-CM | POA: Diagnosis not present

## 2016-05-04 DIAGNOSIS — N400 Enlarged prostate without lower urinary tract symptoms: Secondary | ICD-10-CM

## 2016-05-04 DIAGNOSIS — I129 Hypertensive chronic kidney disease with stage 1 through stage 4 chronic kidney disease, or unspecified chronic kidney disease: Secondary | ICD-10-CM

## 2016-05-04 DIAGNOSIS — Z602 Problems related to living alone: Secondary | ICD-10-CM

## 2016-05-08 ENCOUNTER — Encounter: Payer: Self-pay | Admitting: Nurse Practitioner

## 2016-05-08 ENCOUNTER — Ambulatory Visit (INDEPENDENT_AMBULATORY_CARE_PROVIDER_SITE_OTHER): Payer: Medicare HMO | Admitting: Nurse Practitioner

## 2016-05-08 VITALS — BP 128/66 | HR 70 | Temp 96.9°F | Ht 67.0 in | Wt 137.0 lb

## 2016-05-08 DIAGNOSIS — F411 Generalized anxiety disorder: Secondary | ICD-10-CM

## 2016-05-08 DIAGNOSIS — I2510 Atherosclerotic heart disease of native coronary artery without angina pectoris: Secondary | ICD-10-CM

## 2016-05-08 DIAGNOSIS — N400 Enlarged prostate without lower urinary tract symptoms: Secondary | ICD-10-CM

## 2016-05-08 DIAGNOSIS — C3490 Malignant neoplasm of unspecified part of unspecified bronchus or lung: Secondary | ICD-10-CM | POA: Diagnosis not present

## 2016-05-08 DIAGNOSIS — N183 Chronic kidney disease, stage 3 unspecified: Secondary | ICD-10-CM

## 2016-05-08 DIAGNOSIS — I10 Essential (primary) hypertension: Secondary | ICD-10-CM | POA: Diagnosis not present

## 2016-05-08 DIAGNOSIS — J439 Emphysema, unspecified: Secondary | ICD-10-CM

## 2016-05-08 DIAGNOSIS — E785 Hyperlipidemia, unspecified: Secondary | ICD-10-CM

## 2016-05-08 DIAGNOSIS — F17228 Nicotine dependence, chewing tobacco, with other nicotine-induced disorders: Secondary | ICD-10-CM | POA: Diagnosis not present

## 2016-05-08 LAB — CMP14+EGFR
ALT: 16 IU/L (ref 0–44)
AST: 25 IU/L (ref 0–40)
Albumin/Globulin Ratio: 1.4 (ref 1.2–2.2)
Albumin: 4.1 g/dL (ref 3.5–4.7)
Alkaline Phosphatase: 60 IU/L (ref 39–117)
BUN/Creatinine Ratio: 13 (ref 10–24)
BUN: 20 mg/dL (ref 8–27)
Bilirubin Total: 0.2 mg/dL (ref 0.0–1.2)
CO2: 25 mmol/L (ref 18–29)
Calcium: 9.6 mg/dL (ref 8.6–10.2)
Chloride: 100 mmol/L (ref 96–106)
Creatinine, Ser: 1.49 mg/dL — ABNORMAL HIGH (ref 0.76–1.27)
GFR calc Af Amer: 48 mL/min/{1.73_m2} — ABNORMAL LOW (ref >59)
GFR calc non Af Amer: 42 mL/min/{1.73_m2} — ABNORMAL LOW (ref >59)
Globulin, Total: 3 g/dL (ref 1.5–4.5)
Glucose: 89 mg/dL (ref 65–99)
Potassium: 5.5 mmol/L — ABNORMAL HIGH (ref 3.5–5.2)
Sodium: 139 mmol/L (ref 134–144)
Total Protein: 7.1 g/dL (ref 6.0–8.5)

## 2016-05-08 LAB — LIPID PANEL
Chol/HDL Ratio: 3.5 ratio (ref 0.0–5.0)
Cholesterol, Total: 184 mg/dL (ref 100–199)
HDL: 52 mg/dL (ref >39)
LDL Calculated: 108 mg/dL — ABNORMAL HIGH (ref 0–99)
Triglycerides: 119 mg/dL (ref 0–149)
VLDL Cholesterol Cal: 24 mg/dL (ref 5–40)

## 2016-05-08 MED ORDER — LISINOPRIL 10 MG PO TABS: 10.0000 mg | ORAL_TABLET | Freq: Every day | ORAL | 1 refills | Status: DC

## 2016-05-08 MED ORDER — ATORVASTATIN CALCIUM 10 MG PO TABS: 10.0000 mg | ORAL_TABLET | Freq: Every day | ORAL | 5 refills | Status: DC

## 2016-05-08 MED ORDER — DOXAZOSIN MESYLATE 2 MG PO TABS: 2.0000 mg | ORAL_TABLET | Freq: Every day | ORAL | 1 refills | Status: DC

## 2016-05-08 MED ORDER — CLONAZEPAM 1 MG PO TABS: 1.0000 mg | ORAL_TABLET | Freq: Three times a day (TID) | ORAL | 1 refills | Status: DC | PRN

## 2016-05-08 NOTE — Patient Instructions (Signed)
What You Need to Know About Smokeless Tobacco Use Tobacco use is one of the leading causes of cancer and other chronic health problems. Smokeless tobacco is tobacco that is put directly into the mouth instead of being smoked. It may also be called chewing tobacco or snuff. Smokeless tobacco is made from the leaves of tobacco plants and it comes in several forms:  Loose, dry leaves, plugs, or twists.  Moist pouches.  Dissolving lozenges or strips. Chewing, sucking, or holding the tobacco in your mouth causes your mouth to make more saliva. The saliva mixes with the tobacco to make "tobacco juice" that is swallowed or spit out. How can smokeless tobacco affect me? Using smokeless tobacco:  Increases your risk of developing cancer. Smokeless tobacco contains at least 28 different types of cancer-causing chemicals (carcinogens).  Increases your chances of developing other long-term health problems, including high blood pressure, heart disease, stroke, and dental problems.  Can make you become addicted. Nicotine is one of the chemicals in tobacco. When you chew tobacco, you absorb nicotine from the tobacco juice. This can make you feel more alert than usual.  Can cause problems with pregnancy. Pregnant women who use smokeless tobacco are more likely to miscarry or deliver a baby too early (premature delivery).  Can affect the appearance and health of your mouth. Using smokeless tobacco may cause bad breath, yellow-brown teeth, mouth sores, cracking and bleeding lips, gum recession, and lesions on the soft tissues of your mouth (leukoplakia). What are the benefits of not using smokeless tobacco? The benefits of not using smokeless tobacco include:  A healthy mind because:  You avoid addiction.  A healthy body because:  You avoid dental problems.  You promote healthy pregnancy.  You avoid long-term health problems.  A healthy wallet because:  You avoid costs of buying tobacco.  You  avoid health care costs in the future.  A healthy family because:  You avoid accidental poisoning of children in your household. What can happen if I continue to use smokeless tobacco? If you continue to use smokeless tobacco, you will increase your risk for developing certain cancers. These include:  Tongue.  Lips, mouth, and gums.  Throat (esophagus) and voice box (larynx).  Stomach.  Pancreas.  Bladder.  Colon. Long-term use of smokeless tobacco can also lead to:  High blood pressure, heart disease, and stroke.  Gum disease, gum recession, and bone loss around the teeth.  Tooth decay. How do I quit using smokeless tobacco? Quitting the use of smokeless tobacco can be hard, but it can be done. Follow these steps:  Pick a date to quit. Set a date within the next two weeks. This gives you time to prepare.  Write down the reasons why you are quitting. Keep this list in places where you will see it often, such as on your bathroom mirror or in your car or wallet.  Identify the people, places, things, and activities that make you want to use tobacco (triggers) and avoid them.  Get rid of any tobacco you have and remove any tobacco smells. To do this:  Throw away all containers of tobacco at home, at work, and in your car.  Throw away any other items that you use regularly when you chew tobacco.  Clean your car and make sure to remove all tobacco-related items.  Clean your home, including curtains and carpets.  Tell your family, friends, and coworkers that you are quitting. This can make quitting easier.  Ask your health  care provider for help quitting smokeless tobacco. This may involve treatment. Find out what treatment options are covered by your health insurance.  Keep track of how many days have passed since you quit. Remembering how long and hard you have worked to quit can help you avoid using tobacco again. Where can I get support? Ask your health care provider  if there is a local support group for quitting smokeless tobacco. Where can I get more information? You can learn more about the risks of using smokeless tobacco and the benefits of quitting from these sources:  [place]: www.cancer.gov  American Cancer Society: www.cancer.org When should I seek medical care? Seek medical care if you have:  White or other discolored patches in your mouth.  Difficulty swallowing.  A change in your voice.  Unexplained weight loss.  Stomach pain, nausea, or vomiting. Summary  Smokeless tobacco contains at least 28 different chemicals that are known to cause cancer (carcinogen).  Nicotine is an addictive chemical in smokeless tobacco.  When you quit using smokeless tobacco, you lower your risk of developing cancer. This information is not intended to replace advice given to you by your health care provider. Make sure you discuss any questions you have with your health care provider. Document Released: 06/26/2010 Document Revised: 09/17/2015 Document Reviewed: 09/03/2014 Elsevier Interactive Patient Education  2017 [employer].

## 2016-05-08 NOTE — Progress Notes (Signed)
Subjective:    Patient ID: Steven [name], male    DOB: 1928-03-24, 83 y.o.   MRN: [id number]  HPI   Steven [name] is here today for follow up of chronic medical problem.  Outpatient Encounter Prescriptions as of 05/08/2016  Medication Sig  . albuterol (PROVENTIL HFA;VENTOLIN HFA) 108 (90 Base) MCG/ACT inhaler Inhale 1-2 puffs into the lungs every 4 (four) hours as needed for wheezing or shortness of breath. (Patient not taking: Reported on 04/17/2016)  . albuterol (PROVENTIL) (2.5 MG/3ML) 0.083% nebulizer solution Take 3 mLs (2.5 mg total) by nebulization every 2 (two) hours as needed for wheezing or shortness of breath.  [name] aspirin 81 MG tablet Take 81 mg by mouth daily.   [name] atorvastatin (LIPITOR) 10 MG tablet Take 10 mg by mouth daily.  . clonazePAM (KLONOPIN) 1 MG tablet TAKE 1 TABLET BY MOUTH 3 TIMES A DAY AS NEEDED  . doxazosin (CARDURA) 2 MG tablet Take 1 tablet (2 mg total) by mouth daily.  . Doxylamine Succinate, Sleep, (SLEEP AID PO) Take 1 tablet by mouth at bedtime.  [name] ipratropium (ATROVENT HFA) 17 MCG/ACT inhaler Inhale 2 puffs into the lungs every 4 (four) hours as needed for wheezing. (Patient not taking: Reported on 04/17/2016)  . ipratropium-albuterol (DUONEB) 0.5-2.5 (3) MG/3ML SOLN Take 3 mLs by nebulization every 6 (six) hours.  [name] lisinopril (PRINIVIL,ZESTRIL) 10 MG tablet Take 1 tablet (10 mg total) by mouth daily.  . Multiple Vitamins-Iron (MULTIVITAMINS WITH IRON) TABS tablet Take 1 tablet by mouth daily.  . naproxen sodium (ALEVE) 220 MG tablet Take 220 mg by mouth daily.  [name] Sodium (STOOL SOFTENER & LAXATIVE PO)    No facility-administered encounter medications on file as of 05/08/2016.     1. Atherosclerosis of native coronary artery of native heart without angina pectoris  Doing well- no recent c/o chest pain- sees cardiologist every 6 months  2. Essential hypertension  No c/o headaches or blurred vision- does not check blood pressures at home    3. Pulmonary emphysema, unspecified emphysema type ([place])  Uses duoneb and atrovent nebs daily- no increasing SOB  4. Squamous cell carcinoma of lung, unspecified laterality ([place])  Has been released from oncologist- denies any problems  5. Benign prostatic hyperplasia without lower urinary tract symptoms  Denies problems with urination- sees urologist yearly- is on cardura daily  6. CKD (chronic kidney disease), stage III  Currently just watching labs for any changes  7. Anxiety state  GAD 7 : Generalized Anxiety Score 05/08/2016  Nervous, Anxious, on Edge 1  Control/stop worrying 1  Worry too much - different things 0  Trouble relaxing 1  Restless 1  Easily annoyed or irritable 0  Afraid - awful might happen 0  Total GAD 7 Score 4  Anxiety Difficulty Not difficult at all   Takes klonopin TID- says that he is not able to cut back on dose- gets very anxious if does not take   8. Hyperlipidemia with target LDL less than 100  Not really watching diet  9. Snuff nicotine dependence with other nicotine-induced disorder  Quit smoking 11 years ago but has been using snuff since then- refuses to quit.    New complaints: None today    Review of Systems  Constitutional: Negative for diaphoresis.  Eyes: Negative for pain.  Respiratory: Negative for shortness of breath.   Cardiovascular: Negative for chest pain, palpitations and leg swelling.  Gastrointestinal: Negative for abdominal pain.  Endocrine: Negative for  polydipsia.  Skin: Negative for rash.  Neurological: Negative for dizziness, weakness and headaches.  Hematological: Does not bruise/bleed easily.  All other systems reviewed and are negative.      Objective:   Physical Exam  Constitutional: He is oriented to person, place, and time. He appears well-developed and well-nourished.  HENT:  Head: Normocephalic.  Right Ear: External ear normal.  Left Ear: External ear normal.  Nose: Nose normal.  Mouth/Throat: Oropharynx  is clear and moist.  Eyes: EOM are normal. Pupils are equal, round, and reactive to light.  Neck: Normal range of motion. Neck supple. No JVD present. No thyromegaly present.  Cardiovascular: Normal rate, regular rhythm, normal heart sounds and intact distal pulses.  Exam reveals no gallop and no friction rub.   No murmur heard. Pulmonary/Chest: Effort normal and breath sounds normal. No respiratory distress. He has no wheezes. He has no rales. He exhibits no tenderness.  Abdominal: Soft. Bowel sounds are normal. He exhibits no mass. There is no tenderness.  Musculoskeletal: Normal range of motion. He exhibits no edema.  Lymphadenopathy:    He has no cervical adenopathy.  Neurological: He is alert and oriented to person, place, and time. No cranial nerve deficit.  Skin: Skin is warm and dry.  Psychiatric: He has a normal mood and affect. His behavior is normal. Judgment and thought content normal.    BP 128/66   Pulse 70   Temp (!) 96.9 F (36.1 C) (Oral)   Ht _0  (1.702 m)   Wt 137 lb (62.1 kg)   BMI 21.46 kg/m      Assessment & Plan:  1. Atherosclerosis of native coronary artery of native heart without angina pectoris Keep follow up with cardiology  2. Essential hypertension Low sodium diet - lisinopril (PRINIVIL,ZESTRIL) 10 MG tablet; Take 1 tablet (10 mg total) by mouth daily.  Dispense: 90 tablet; Refill: 1 - CMP14+EGFR  3. Pulmonary emphysema, unspecified emphysema type ([place]) use nebs as needed  4. Squamous cell carcinoma of lung, unspecified laterality ([place]) Chest x ray in februaru was clear  5. Benign prostatic hyperplasia without lower urinary tract symptoms - doxazosin (CARDURA) 2 MG tablet; Take 1 tablet (2 mg total) by mouth daily.  Dispense: 90 tablet; Refill: 1  6. CKD (chronic kidney disease), stage III Will continue to watch labs  7. Anxiety state Stress management - clonazePAM (KLONOPIN) 1 MG tablet; Take 1 tablet (1 mg total) by mouth 3 (three)  times daily as needed.  Dispense: 90 tablet; Refill: 1  8. Hyperlipidemia with target LDL less than 100 Low fta diet - atorvastatin (LIPITOR) 10 MG tablet; Take 1 tablet (10 mg total) by mouth daily.  Dispense: 30 tablet; Refill: 5 - Lipid panel  9. Chewing tobacco nicotine dependence with other nicotine-induced disorder Encouraged to stop smokeless tobacco    Labs pending Health maintenance reviewed Diet and exercise encouraged Continue all meds Follow up  In 3 months   [place], [name]

## 2016-05-10 ENCOUNTER — Other Ambulatory Visit: Payer: Medicare HMO

## 2016-05-10 ENCOUNTER — Telehealth: Payer: Self-pay | Admitting: *Deleted

## 2016-05-10 ENCOUNTER — Other Ambulatory Visit: Payer: Self-pay | Admitting: Nurse Practitioner

## 2016-05-10 DIAGNOSIS — E876 Hypokalemia: Secondary | ICD-10-CM

## 2016-05-10 NOTE — Telephone Encounter (Signed)
-----   Message from [facility], [place] sent at 05/10/2016  1:08 PM EDT ----- Kidney and liver function stable Creatine slowly creping upward- avoid NSAIDS K elevated- please come back and have repeated Cholesterol looks good Continue current meds- low fat diet and exercise and recheck in 3 months

## 2016-05-10 NOTE — Telephone Encounter (Signed)
Pt notified of results Verbalizes understanding 

## 2016-05-11 LAB — BMP8+EGFR
BUN/Creatinine Ratio: 15 (ref 10–24)
BUN: 21 mg/dL (ref 8–27)
CO2: 24 mmol/L (ref 18–29)
Calcium: 9.4 mg/dL (ref 8.6–10.2)
Chloride: 100 mmol/L (ref 96–106)
Creatinine, Ser: 1.38 mg/dL — ABNORMAL HIGH (ref 0.76–1.27)
GFR calc Af Amer: 53 mL/min/{1.73_m2} — ABNORMAL LOW (ref >59)
GFR calc non Af Amer: 46 mL/min/{1.73_m2} — ABNORMAL LOW (ref >59)
Glucose: 91 mg/dL (ref 65–99)
Potassium: 4.7 mmol/L (ref 3.5–5.2)
Sodium: 140 mmol/L (ref 134–144)

## 2016-05-30 ENCOUNTER — Encounter: Payer: Self-pay | Admitting: Nurse Practitioner

## 2016-05-30 ENCOUNTER — Ambulatory Visit (INDEPENDENT_AMBULATORY_CARE_PROVIDER_SITE_OTHER): Payer: Medicare HMO | Admitting: Nurse Practitioner

## 2016-05-30 ENCOUNTER — Ambulatory Visit (INDEPENDENT_AMBULATORY_CARE_PROVIDER_SITE_OTHER): Payer: Medicare HMO

## 2016-05-30 VITALS — BP 138/62 | HR 72 | Temp 97.1°F | Ht 67.0 in | Wt 138.8 lb

## 2016-05-30 DIAGNOSIS — M25561 Pain in right knee: Secondary | ICD-10-CM

## 2016-05-30 DIAGNOSIS — S86899A Other injury of other muscle(s) and tendon(s) at lower leg level, unspecified leg, initial encounter: Secondary | ICD-10-CM

## 2016-05-30 DIAGNOSIS — M25562 Pain in left knee: Secondary | ICD-10-CM

## 2016-05-30 MED ORDER — MELOXICAM 15 MG PO TABS: 15.0000 mg | ORAL_TABLET | Freq: Every day | ORAL | 0 refills | Status: DC

## 2016-05-30 MED ORDER — V-2 HIGH COMPRESSION HOSE MISC: 0 refills | Status: DC

## 2016-05-30 NOTE — Progress Notes (Signed)
   Subjective:    Patient ID: Steven [name], male    DOB: 10-Oct-1928, 83 y.o.   MRN: [id number]  HPI Patient comes in today c/o bilateral knee pain- started about 2 months ago- denies injury. WHen sitting he feels fine- walking gradually increases pain to the point he has to stop and sit down. Pain will radiate down front of both legs. Rates 5/10 on pain scale. No edema.    Review of Systems  Respiratory: Negative.   Cardiovascular: Negative.   Musculoskeletal: Positive for arthralgias (bil knees).  Neurological: Negative.   Psychiatric/Behavioral: Negative.   All other systems reviewed and are negative.      Objective:   Physical Exam  Constitutional: He appears well-developed and well-nourished. No distress.  Cardiovascular: Normal rate.   Pulmonary/Chest: Effort normal and breath sounds normal.  Musculoskeletal:  No knee effusion FROMpf bil knees without pain- no crepitus ligaments intact Pain on palpation down both shins   BP 138/62   Pulse 72   Temp 97.1 F (36.2 C) (Oral)   Ht '5\' 7"'$  (1.702 m)   Wt 138 lb 12.8 oz (63 kg)   BMI 21.74 kg/m   bil knee xray- normal appearing- very mild degenerative changes-Preliminary reading by Ronnald Collum, [name]  [facility]       Assessment & Plan:   1. Acute pain of both knees   2. Shin splints, unspecified laterality, initial encounter    Meds ordered this encounter  Medications  . meloxicam (MOBIC) 15 MG tablet    Sig: Take 1 tablet (15 mg total) by mouth daily.    Dispense:  30 tablet    Refill:  0    Order Specific Question:   Supervising Provider    Answer:   VINCENT, CAROL L [[id number]]  compression hose when up during the day RTO prn Orders Placed This Encounter  Procedures  . DG Knee Bilateral Standing AP    Standing Status:   Future    Number of Occurrences:   1    Standing Expiration Date:   07/30/2017    Order Specific Question:   Reason for Exam (SYMPTOM  OR DIAGNOSIS REQUIRED)    Answer:   bil knee pain    Order  Specific Question:   Preferred imaging location?    Answer:   Internal    Order Specific Question:   Radiology Contrast Protocol - do NOT remove file path    Answer:   \\[email or web address]    [place], [name]

## 2016-05-30 NOTE — Patient Instructions (Signed)
Shin Splints Shin splints is a painful condition that is felt in the front, lower parts of the legs. Muscles, cordlike structures that connect muscles to bones (tendons), and the thin layer that covers the shin bone get irritated (inflamed). It can be caused by activities or exercises that are demanding. It may also be caused by sports that have sudden starts and stops. Follow these instructions at home: Injury care   If told, put ice on the injured area.  Put ice in a plastic bag.  Place a towel between your skin and the bag.  Leave the ice on for 20 minutes, 2-3 times per day.  Massage, stretch, and strengthen the shin area. Activity   Rest as needed. Return to activity slowly as told by your doctor.  Do not use your shins to support your body weight when you start exercising again. Try cycling or swimming.  Stop running if you have pain.  Warm up before you exercise.  Run on a flat and firm surface, if possible.  Change the intensity of your exercise slowly.  Increase your running distance slowly. For example, if you are running 5 miles this week, you should only add  mile to your run next week. General instructions   Take medicines only as told by your doctor.  Wear shoes that have good arch support and heel support. Your shoes should cushion and support you as you walk or run.  Change your athletic shoes every 6 months, or every 350-450 miles. Contact a doctor if:  Your problems continue or they get worse even with treatment.  Your pain spreads, is worse, or changes over time.  You have swelling in your lower leg that gets worse.  Your shin is red and feels warm. Get help right away if:  You have very bad pain.  You have trouble walking. This information is not intended to replace advice given to you by your health care provider. Make sure you discuss any questions you have with your health care provider. Document Released: 10/04/2010 Document Revised:  09/17/2015 Document Reviewed: 01/18/2014 Elsevier Interactive Patient Education  2017 [employer].

## 2016-06-30 ENCOUNTER — Other Ambulatory Visit: Payer: Self-pay | Admitting: Nurse Practitioner

## 2016-06-30 DIAGNOSIS — F411 Generalized anxiety disorder: Secondary | ICD-10-CM

## 2016-07-03 ENCOUNTER — Telehealth: Payer: Self-pay | Admitting: Nurse Practitioner

## 2016-07-03 DIAGNOSIS — M25561 Pain in right knee: Secondary | ICD-10-CM

## 2016-07-03 DIAGNOSIS — M25562 Pain in left knee: Principal | ICD-10-CM

## 2016-07-03 NOTE — Telephone Encounter (Signed)
Phoned in.

## 2016-07-03 NOTE — Telephone Encounter (Signed)
Please call in clonazepam with 1 refills

## 2016-07-05 NOTE — Telephone Encounter (Signed)
Referral for ortho placed. Attempted to call pt and advise him of the referral but no answer. LMTCB.

## 2016-07-05 NOTE — Telephone Encounter (Signed)
Please put himin with orthro when they come here  Next- let patient know we are doing referral to ortho

## 2016-07-06 ENCOUNTER — Telehealth: Payer: Self-pay | Admitting: Nurse Practitioner

## 2016-07-06 MED ORDER — MELOXICAM 15 MG PO TABS: 15.0000 mg | ORAL_TABLET | Freq: Every day | ORAL | 0 refills | Status: DC

## 2016-08-09 NOTE — Telephone Encounter (Signed)
lmtcb jkp 7/5

## 2016-08-10 NOTE — Telephone Encounter (Signed)
Phone call taken care of in different encounter.  This encounter will now be closed  

## 2016-08-15 ENCOUNTER — Ambulatory Visit: Payer: Medicare HMO

## 2016-08-16 ENCOUNTER — Encounter: Payer: Self-pay | Admitting: Nurse Practitioner

## 2016-08-22 ENCOUNTER — Ambulatory Visit (INDEPENDENT_AMBULATORY_CARE_PROVIDER_SITE_OTHER): Payer: Medicare HMO | Admitting: *Deleted

## 2016-08-22 VITALS — BP 116/60 | HR 79 | Temp 97.6°F | Ht 67.0 in | Wt 135.0 lb

## 2016-08-22 DIAGNOSIS — Z0000 Encounter for general adult medical examination without abnormal findings: Secondary | ICD-10-CM

## 2016-08-22 NOTE — Progress Notes (Signed)
Subjective:   Steven [name] is a 83 y.o. male who presents for an Initial Medicare Annual Wellness Visit.  Steven [name] worked for [employer] for 12 years before having to go on disability for anxiety.  He lives alone and has a son, a daughter and a grandson that does not visit often.  Steven [name] does not exercise due to leg pain that has been ongoing for 1 year and eats meals that consist of toast, cereals and sandwiches.  Steven [name] was hospitalized in February 2018 for pneumonia.  Over all Steven [name] feels that his health is about the same as it was a year ago.       Objective:    Today's Vitals   08/22/16 1350  BP: 116/60  Pulse: 79  Temp: 97.6 F (36.4 C)  TempSrc: Oral  Weight: 135 lb (61.2 kg)  Height: 5\' 7"  (1.702 m)   Body mass index is 21.14 kg/m.  Current Medications (verified) Outpatient Encounter Prescriptions as of 08/22/2016  Medication Sig  . albuterol (PROVENTIL) (2.5 MG/3ML) 0.083% nebulizer solution Take 3 mLs (2.5 mg total) by nebulization every 2 (two) hours as needed for wheezing or shortness of breath.  [name] aspirin 81 MG tablet Take 81 mg by mouth daily.   [name] atorvastatin (LIPITOR) 10 MG tablet Take 1 tablet (10 mg total) by mouth daily.  . clonazePAM (KLONOPIN) 1 MG tablet TAKE 1 TABLET BY MOUTH THREE TIMES A DAY AS NEEDED  . doxazosin (CARDURA) 2 MG tablet Take 1 tablet (2 mg total) by mouth daily.  [name] lisinopril (PRINIVIL,ZESTRIL) 10 MG tablet Take 1 tablet (10 mg total) by mouth daily.  . meloxicam (MOBIC) 15 MG tablet Take 1 tablet (15 mg total) by mouth daily.  . Multiple Vitamins-Iron (MULTIVITAMINS WITH IRON) TABS tablet Take 1 tablet by mouth daily.  [name] albuterol (PROVENTIL HFA;VENTOLIN HFA) 108 (90 Base) MCG/ACT inhaler Inhale 1-2 puffs into the lungs every 4 (four) hours as needed for wheezing or shortness of breath. (Patient not taking: Reported on 08/22/2016)  . Sennosides-Docusate Sodium (STOOL SOFTENER & LAXATIVE PO)   . [DISCONTINUED] Doxylamine  Succinate, Sleep, (SLEEP AID PO) Take 1 tablet by mouth at bedtime.  . [DISCONTINUED] Elastic Bandages & Supports (V-2 HIGH COMPRESSION HOSE) MISC Wear daily when up walking around (Patient not taking: Reported on 08/22/2016)  . [DISCONTINUED] ipratropium (ATROVENT HFA) 17 MCG/ACT inhaler Inhale 2 puffs into the lungs every 4 (four) hours as needed for wheezing. (Patient not taking: Reported on 08/22/2016)  . [DISCONTINUED] ipratropium-albuterol (DUONEB) 0.5-2.5 (3) MG/3ML SOLN Take 3 mLs by nebulization every 6 (six) hours. (Patient not taking: Reported on 08/22/2016)   No facility-administered encounter medications on file as of 08/22/2016.     Allergies (verified) Patient has no known allergies.   History: Past Medical History:  Diagnosis Date  . Anxiety   . Cancer [facility]) 2013   Lung - cancer free x3 years.   . Hyperlipidemia   . Hypertension   . Myocardial infarction ([place]) 1999   Past Surgical History:  Procedure Laterality Date  . CARPAL TUNNEL RELEASE  1980's   right and left  . CHOLECYSTECTOMY    . COLONOSCOPY N/A 09/29/2014   Procedure: COLONOSCOPY;  Surgeon: Rogene Houston, MD;  Location: [facility];  Service: Endoscopy;  Laterality: N/A;  . CORONARY ARTERY BYPASS GRAFT  1999   5 grafts  . FLEXIBLE BRONCHOSCOPY  11/07/2011   Procedure: FLEXIBLE BRONCHOSCOPY;  Surgeon: Gaye Pollack, MD;  Location:  [facility] OR;  Service: Thoracic;  Laterality: N/A;  . HERNIA REPAIR  3295   umbilical hernia repair  . JOINT REPLACEMENT  2000   right shoulder rotator cuff repair   Family History  Problem Relation Age of Onset  . Cancer Mother   . Pulmonary embolism Father    Social History   Occupational History  . Not on file.   Social History Main Topics  . Smoking status: Former Smoker    Packs/day: 2.00    Years: 73.00    Types: Cigarettes    Start date: 02/05/2006    Quit date: 11/05/2006  . Smokeless tobacco: Never Used  . Alcohol use No  . Drug use: No  . Sexual activity: Not  on file   Tobacco Counseling Former Smoker.  Quit for 10 years  Activities of Daily Living In your present state of health, do you have any difficulty performing the following activities: 08/22/2016 03/15/2016  Hearing? N N  Vision? N N  Difficulty concentrating or making decisions? Y N  Walking or climbing stairs? Y N  Dressing or bathing? N N  Doing errands, shopping? N N  Some recent data might be hidden  Patient states that he does have some memory loss  Immunizations and Health Maintenance Immunization History  Administered Date(s) Administered  . Influenza,inj,Quad PF,36+ Mos 11/24/2012, 11/12/2013  . Pneumococcal Conjugate-13 06/18/2014  . Pneumococcal Polysaccharide-23 09/06/2003   There are no preventive care reminders to display for this patient.  Patient Care Team: [name], [name] as PCP - General (Nurse Practitioner) Chipper Herb, MD as Attending Physician (Family Medicine) Gaye Pollack, MD (Cardiothoracic Surgery) Tyler Pita, MD (Radiation Oncology)  Indicate any recent Medical Services you may have received from other than [facility] providers in the past year (date may be approximate).    Assessment:   This is a routine wellness examination for [facility].   Hearing/Vision screen Number given to [facility] DR to schedule an Eye Exam   Dietary issues and exercise activities discussed: Current Exercise Habits: The patient does not participate in regular exercise at present, Exercise limited by: None identified  Goals    . Exercise 3x per week (30 min per time)          Seated Strengthening Handout     . Have 3 meals a day          Eat 3 meals daily Increase fruits and vegetables Increased lean protein      Depression Screen PHQ 2/9 Scores 08/22/2016 05/30/2016 05/08/2016 03/29/2016  PHQ - 2 Score 0 0 0 0    Fall Risk Fall Risk  08/22/2016 05/30/2016 05/08/2016 03/29/2016 03/15/2016  Falls in the past year? No No No No No  Number falls in past yr: -  - - - -  Injury with Fall? - - - - -    Cognitive Function: MMSE - Mini Mental State Exam 08/22/2016  Orientation to time 5  Orientation to Place 5  Registration 3  Attention/ Calculation 5  Recall 0  Language- name 2 objects 2  Language- repeat 1  Language- follow 3 step command 2  Language- read & follow direction 1  Write a sentence 1  Copy design 1  Total score 26    Patient has difficulty with short term memory    Screening Tests Health Maintenance  Topic Date Due  . TETANUS/TDAP  08/22/2017 (Originally 04/23/1947)  . INFLUENZA VACCINE  09/05/2016  . PNA vac Low Risk Adult  Completed  Declines Tetanus and shingles Vaccine at this time.      Plan:   Encouraged to eat healthy meals that consist of fruits, vegetables and lean proteins.  Encouraged to try seated strengthening exercise (Handout given).  Encouraged to schedule eye exam (Number given to make an appt).  Encouraged to look over Advance directive packet and to bring in copy to be scanned into chart.  Also encouraged Mr. Redder to keep follow up appointment with Mary-Margaret Hassell Done, [name].   I have personally reviewed and noted the following in the patient's chart:   . Medical and social history . Use of alcohol, tobacco or illicit drugs  . Current medications and supplements . Functional ability and status . Nutritional status . Physical activity . Advanced directives . List of other physicians . Hospitalizations, surgeries, and ER visits in previous 12 months . Vitals . Screenings to include cognitive, depression, and falls . Referrals and appointments  In addition, I have reviewed and discussed with patient certain preventive protocols, quality metrics, and best practice recommendations. A written personalized care plan for preventive services as well as general preventive health recommendations were provided to patient.     Wardell Heath, LPN   3/88/8280   I have reviewed and agree with the above  AWV documentation.   Mary-Margaret Hassell Done, [name]

## 2016-08-22 NOTE — Patient Instructions (Signed)
Keep Follow up Appointment with [name] Make appointment for Price [phone] Look over Advance Directive Packet Seated Strengthening exercise     Mr. Steven [name] , Thank you for taking time to come for your Medicare Wellness Visit. I appreciate your ongoing commitment to your health goals. Please review the following plan we discussed and let me know if I can assist you in the future.   These are the goals we discussed: Goals    . Exercise 3x per week (30 min per time)          Seated Strengthening Handout     . Have 3 meals a day          Eat 3 meals daily Increase fruits and vegetables Increased lean protein       This is a list of the screening recommended for you and due dates:  Health Maintenance  Topic Date Due  . Tetanus Vaccine  08/22/2017*  . Flu Shot  09/05/2016  . Pneumonia vaccines  Completed  *Topic was postponed. The date shown is not the original due date.      Advance Directive Advance directives are legal documents that let you make choices ahead of time about your health care and medical treatment in case you become unable to communicate for yourself. Advance directives are a way for you to communicate your wishes to family, friends, and health care providers. This can help convey your decisions about end-of-life care if you become unable to communicate. Discussing and writing advance directives should happen over time rather than all at once. Advance directives can be changed depending on your situation and what you want, even after you have signed the advance directives. If you do not have an advance directive, some states assign family decision makers to act on your behalf based on how closely you are related to them. Each state has its own laws regarding advance directives. You may want to check with your health care provider, attorney, or state representative about the laws in your state. There are different types of advance  directives, such as:  Medical power of attorney.  Living will.  Do not resuscitate (DNR) or do not attempt resuscitation (DNAR) order.  Health care proxy and medical power of attorney A health care proxy, also called a health care agent, is a person who is appointed to make medical decisions for you in cases in which you are unable to make the decisions yourself. Generally, people choose someone they know well and trust to represent their preferences. Make sure to ask this person for an agreement to act as your proxy. A proxy may have to exercise judgment in the event of a medical decision for which your wishes are not known. A medical power of attorney is a legal document that names your health care proxy. Depending on the laws in your state, after the document is written, it may also need to be:  Signed.  Notarized.  Dated.  Copied.  Witnessed.  Incorporated into your medical record.  You may also want to appoint someone to manage your financial affairs in a situation in which you are unable to do so. This is called a durable power of attorney for finances. It is a separate legal document from the durable power of attorney for health care. You may choose the same person or someone different from your health care proxy to act as your agent in financial matters. If you do not appoint a proxy, or if  there is a concern that the proxy is not acting in your best interests, a court-appointed guardian may be designated to act on your behalf. Living will A living will is a set of instructions documenting your wishes about medical care when you cannot express them yourself. Health care providers should keep a copy of your living will in your medical record. You may want to give a copy to family members or friends. To alert caregivers in case of an emergency, you can place a card in your wallet to let them know that you have a living will and where they can find it. A living will is used if you  become:  Terminally ill.  Incapacitated.  Unable to communicate or make decisions.  Items to consider in your living will include:  The use or non-use of life-sustaining equipment, such as dialysis machines and breathing machines (ventilators).  A DNR or DNAR order, which is the instruction not to use cardiopulmonary resuscitation (CPR) if breathing or heartbeat stops.  The use or non-use of tube feeding.  Withholding of food and fluids.  Comfort (palliative) care when the goal becomes comfort rather than a cure.  Organ and tissue donation.  A living will does not give instructions for distributing your money and property if you should pass away. It is recommended that you seek the advice of a lawyer when writing a will. Decisions about taxes, beneficiaries, and asset distribution will be legally binding. This process can relieve your family and friends of any concerns surrounding disputes or questions that may come up about the distribution of your assets. DNR or DNAR A DNR or DNAR order is a request not to have CPR in the event that your heart stops beating or you stop breathing. If a DNR or DNAR order has not been made and shared, a health care provider will try to help any patient whose heart has stopped or who has stopped breathing. If you plan to have surgery, talk with your health care provider about how your DNR or DNAR order will be followed if problems occur. Summary  Advance directives are the legal documents that allow you to make choices ahead of time about your health care and medical treatment in case you become unable to communicate for yourself.  The process of discussing and writing advance directives should happen over time. You can change the advance directives, even after you have signed them.  Advance directives include DNR or DNAR orders, living wills, and designating an agent as your medical power of attorney. This information is not intended to replace advice  given to you by your health care provider. Make sure you discuss any questions you have with your health care provider. Document Released: 05/01/2007 Document Revised: 12/12/2015 Document Reviewed: 12/12/2015 Elsevier Interactive Patient Education  2017 [place] on a Budget There are many ways to save money at the grocery store and continue to eat healthy. You can be successful if you plan your meals according to your budget, purchase according to your budget and grocery list, and prepare food yourself. How can I buy more food on a limited budget? Plan  Plan meals and snacks according to a grocery list and budget you create.  Look for recipes where you can cook once and make enough food for two meals.  Include meals that will "stretch" more expensive foods such as stews, casseroles, and stir-fry dishes.  Make a grocery list and make sure to bring it  with you to the store. If you have a smart phone, you could use your phone to create your shopping list. Purchase  When grocery shopping, buy only the items on your grocery list and go only to the areas of the store that have the items on your list. Prepare  Some meal items can be prepared in advance. Pre-cook on days when you have extra time.  Make extra food (such as by doubling recipes) and freeze the extras in meal-sized containers or in individual portions for fast meals and snacks.  Use leftovers in your meal plan for the week.  Try some meatless meals or try "no cook" meals like salads.  When you come home from the grocery store, wash and prepare your fruits and vegetables so they are ready to use and eat. This will help reduce food waste. How can I buy more food on a limited budget? Try these tips the next time you go shopping:  [employer] store brands or generic brands.  Use coupons only for foods and brands you normally buy. Avoid buying items you wouldn't normally buy simply because they are on  sale.  Check online and in newspapers for weekly deals.  Buy healthy items from the bulk bins when available, such as herbs, spices, flours, pastas, nuts, and dried fruit.  Buy fruits and vegetables that are in season. Prices are usually lower on in-season produce.  Compare and contrast different items. You can do this by looking at the unit price on the price tag. Use it to compare different brands and sizes to find out which item is the best deal.  Choose naturally low-cost healthy items, such as carrots, potatoes, apples, bananas, and oranges. Dried or canned beans are a low-cost protein source.  Buy in bulk and freeze extra food. Items you can buy in bulk include meats, fish, poultry, frozen fruits, and frozen vegetables.  Limit the purchase of prepared or "ready-to-eat" foods, such as pre-cut fruits and vegetables and pre-made salads.  If possible, shop around to discover which grocery store offers the best prices. Some stores charge much more than other stores for the same items.  Do not shop when you are hungry. If you shop while hungry, It may be hard to stick to your list and budget.  Stick to your list and resist impulse buys. Treat your list as your official plan for the week.  Buy a variety of vegetables and fruit by purchasing fresh, frozen, and canned items.  Look beyond eye level. Foods at eye level (adult or child eye level) are more expensive. Look at the top and bottom shelves for deals.  Be efficient with your time when shopping. The more time you spend at the store, the more money you are likely to spend.  Consider other retailers such as dollar stores, larger [employer], local fruit and vegetable stands, and farmers markets.  What are some tips for less expensive food substitutions? When choosing more expensive foods like meats and dairy, try these tips to save money:  Choose cheaper cuts of meat, such as bone-in chicken thighs and drumsticks instead  skinless and boneless chicken. When you are ready to prepare the chicken, you can remove the skin yourself to make it healthier.  Choose lean meats like chicken or [place]. When choosing ground beef, make sure it is lean ground beef (92% lean, 8% fat). If you do buy a fattier ground beef, drain the fat before eating.  Buy dried beans and peas, such  as lentils, split peas, or kidney beans.  For seafood, choose canned tuna, salmon, or sardines.  Eggs are a low-cost source of protein.  Buy the larger tubs of yogurt instead of individual-sized containers.  Choose water instead of sodas and other sweetened beverages.  Skip buying chips, cookies, and other "junk food". These items are usually expensive, high in calories, and low in nutritional value.  How can I prepare the foods I buy in the healthiest way? Practice these tips for cooking foods in the healthiest way to reduce excess fat and calorie intake:  Steam, saute, grill, or bake foods instead of frying them.  Make sure half your plate is filled with fruits or vegetables. Choose from fresh, frozen, or canned fruits and vegetables. If eating canned, remember to rinse them before eating. This will remove any excess salt added for packaging.  Trim all fat from meat before cooking. Remove the skin from chicken or [place].  Spoon off fat from meat dishes once they have been chilled in the refrigerator and the fat has hardened on the top.  Use skim milk, low-fat milk, or evaporated skim milk when making cream sauces, soups, or puddings.  Substitute low-fat yogurt, sour cream, or cottage cheese for sour cream and mayonnaise in dips and dressings.  Try lemon juice, herbs, or spices to season food instead of salt, butter, or margarine.  This information is not intended to replace advice given to you by your health care provider. Make sure you discuss any questions you have with your health care provider. Document Released: 09/25/2013 Document  Revised: 08/12/2015 Document Reviewed: 08/25/2013 Elsevier Interactive Patient Education  [employer].

## 2016-08-30 ENCOUNTER — Ambulatory Visit (INDEPENDENT_AMBULATORY_CARE_PROVIDER_SITE_OTHER): Payer: Medicare HMO | Admitting: Nurse Practitioner

## 2016-08-30 ENCOUNTER — Encounter: Payer: Self-pay | Admitting: Nurse Practitioner

## 2016-08-30 VITALS — BP 136/59 | HR 72 | Temp 97.1°F | Ht 67.0 in | Wt 136.0 lb

## 2016-08-30 DIAGNOSIS — J439 Emphysema, unspecified: Secondary | ICD-10-CM

## 2016-08-30 DIAGNOSIS — I2510 Atherosclerotic heart disease of native coronary artery without angina pectoris: Secondary | ICD-10-CM

## 2016-08-30 DIAGNOSIS — N183 Chronic kidney disease, stage 3 unspecified: Secondary | ICD-10-CM

## 2016-08-30 DIAGNOSIS — M25561 Pain in right knee: Secondary | ICD-10-CM | POA: Diagnosis not present

## 2016-08-30 DIAGNOSIS — C3490 Malignant neoplasm of unspecified part of unspecified bronchus or lung: Secondary | ICD-10-CM | POA: Diagnosis not present

## 2016-08-30 DIAGNOSIS — F411 Generalized anxiety disorder: Secondary | ICD-10-CM

## 2016-08-30 DIAGNOSIS — N400 Enlarged prostate without lower urinary tract symptoms: Secondary | ICD-10-CM

## 2016-08-30 DIAGNOSIS — F17228 Nicotine dependence, chewing tobacco, with other nicotine-induced disorders: Secondary | ICD-10-CM

## 2016-08-30 DIAGNOSIS — E785 Hyperlipidemia, unspecified: Secondary | ICD-10-CM

## 2016-08-30 DIAGNOSIS — M25562 Pain in left knee: Secondary | ICD-10-CM

## 2016-08-30 DIAGNOSIS — I10 Essential (primary) hypertension: Secondary | ICD-10-CM | POA: Diagnosis not present

## 2016-08-30 DIAGNOSIS — K922 Gastrointestinal hemorrhage, unspecified: Secondary | ICD-10-CM

## 2016-08-30 MED ORDER — MELOXICAM 15 MG PO TABS: 15.0000 mg | ORAL_TABLET | Freq: Every day | ORAL | 0 refills | Status: DC

## 2016-08-30 MED ORDER — LISINOPRIL 10 MG PO TABS: 10.0000 mg | ORAL_TABLET | Freq: Every day | ORAL | 1 refills | Status: DC

## 2016-08-30 MED ORDER — ATORVASTATIN CALCIUM 10 MG PO TABS: 10.0000 mg | ORAL_TABLET | Freq: Every day | ORAL | 5 refills | Status: DC

## 2016-08-30 MED ORDER — CLONAZEPAM 1 MG PO TABS: 1.0000 mg | ORAL_TABLET | Freq: Three times a day (TID) | ORAL | 1 refills | Status: DC | PRN

## 2016-08-30 MED ORDER — DOXAZOSIN MESYLATE 2 MG PO TABS: 2.0000 mg | ORAL_TABLET | Freq: Every day | ORAL | 1 refills | Status: DC

## 2016-08-30 NOTE — Progress Notes (Signed)
Subjective:    Patient ID: Steven [name], male    DOB: 1928/09/06, 83 y.o.   MRN: [id number]  HPI Steven [name] is here today for follow up of chronic medical problem.  Outpatient Encounter Prescriptions as of 08/30/2016  Medication Sig  . albuterol (PROVENTIL HFA;VENTOLIN HFA) 108 (90 Base) MCG/ACT inhaler Inhale 1-2 puffs into the lungs every 4 (four) hours as needed for wheezing or shortness of breath.  [name] albuterol (PROVENTIL) (2.5 MG/3ML) 0.083% nebulizer solution Take 3 mLs (2.5 mg total) by nebulization every 2 (two) hours as needed for wheezing or shortness of breath.  [name] aspirin 81 MG tablet Take 81 mg by mouth daily.   [name] atorvastatin (LIPITOR) 10 MG tablet Take 1 tablet (10 mg total) by mouth daily.  . clonazePAM (KLONOPIN) 1 MG tablet TAKE 1 TABLET BY MOUTH THREE TIMES A DAY AS NEEDED  . doxazosin (CARDURA) 2 MG tablet Take 1 tablet (2 mg total) by mouth daily.  [name] lisinopril (PRINIVIL,ZESTRIL) 10 MG tablet Take 1 tablet (10 mg total) by mouth daily.  . meloxicam (MOBIC) 15 MG tablet Take 1 tablet (15 mg total) by mouth daily.  . Multiple Vitamins-Iron (MULTIVITAMINS WITH IRON) TABS tablet Take 1 tablet by mouth daily.  [name] Sodium (STOOL SOFTENER & LAXATIVE PO)    No facility-administered encounter medications on file as of 08/30/2016.     1. Essential hypertension  Patient blood pressure managed with lisinopril.  Patient does not check blood pressure at home.  2. Pulmonary emphysema, unspecified emphysema type ([place])  Patient has albuterol for management of symptoms.  3. Squamous cell carcinoma of lung, unspecified laterality ([place])  Released by oncologist, no concerns at this time.  4. Atherosclerosis of native coronary artery of native heart without angina pectoris No recent chest pain.  Sees cardiologist every 6 months.   5. Benign prostatic hyperplasia without lower urinary tract symptoms  No problems with urination.  Patient managed with Cardura daily.    6. CKD (chronic kidney disease), stage III  Watching labs currently for any changes.  7. Hyperlipidemia with target LDL less than 100  Does not watch diet.    8. Chewing tobacco nicotine dependence with other nicotine-induced disorder  Uses snuff with no desire to quit.  9. Anxiety state  Patient taking klonopin TID for management.    New complaints: Still c/o of bil knee pain- doe snot want to have surgery anymore- wants something can rub on knees that will help with pain.  Social history: Lives alone  Review of Systems  Constitutional: Negative for activity change and appetite change.  Respiratory: Negative for cough, shortness of breath and wheezing.   Cardiovascular: Negative for chest pain and palpitations.  Gastrointestinal: Negative for abdominal distention and abdominal pain.  Genitourinary: Negative for difficulty urinating.  Neurological: Negative for dizziness, light-headedness and headaches.  All other systems reviewed and are negative.      Objective:   Physical Exam  Constitutional: He is oriented to person, place, and time. He appears well-developed and well-nourished. No distress.  HENT:  Head: Normocephalic.  Right Ear: External ear normal.  Left Ear: External ear normal.  Mouth/Throat: Oropharynx is clear and moist.  Eyes: Pupils are equal, round, and reactive to light.  Neck: Normal range of motion. Neck supple. No JVD present. No thyromegaly present.  Cardiovascular: Normal rate, regular rhythm, normal heart sounds and intact distal pulses.   No murmur heard. Pulmonary/Chest: Effort normal. No respiratory distress. He has wheezes (  expiratory wheezing in all lung lobes).  Abdominal: Soft. Bowel sounds are normal. He exhibits no distension. There is no tenderness.  Musculoskeletal: Normal range of motion.  Lymphadenopathy:    He has no cervical adenopathy.  Neurological: He is alert and oriented to person, place, and time.  Skin: Skin is warm and dry.   Psychiatric: He has a normal mood and affect. His behavior is normal. Judgment and thought content normal.   BP (!) 136/59   Pulse 72   Temp (!) 97.1 F (36.2 C) (Oral)   Ht _0  (1.702 m)   Wt 136 lb (61.7 kg)   BMI 21.30 kg/m      Assessment & Plan:  1. Essential hypertension Low sodium diet - lisinopril (PRINIVIL,ZESTRIL) 10 MG tablet; Take 1 tablet (10 mg total) by mouth daily.  Dispense: 90 tablet; Refill: 1 - CMP14+EGFR  2. Pulmonary emphysema, unspecified emphysema type ([place])  3. Squamous cell carcinoma of lung, unspecified laterality ([place])  4. Atherosclerosis of native coronary artery of native heart without angina pectoris Keep follow up appointment with cardiology  5. Benign prostatic hyperplasia without lower urinary tract symptoms Keep follow up with urology - doxazosin (CARDURA) 2 MG tablet; Take 1 tablet (2 mg total) by mouth daily.  Dispense: 90 tablet; Refill: 1  6. CKD (chronic kidney disease), stage III Currently watching labs  7. Hyperlipidemia with target LDL less than 100 Low fat diet - atorvastatin (LIPITOR) 10 MG tablet; Take 1 tablet (10 mg total) by mouth daily.  Dispense: 30 tablet; Refill: 5 - Lipid panel  8. Chewing tobacco nicotine dependence with other nicotine-induced disorder Has no desire to quit  9. Anxiety state Stress management - clonazePAM (KLONOPIN) 1 MG tablet; Take 1 tablet (1 mg total) by mouth 3 (three) times daily as needed.  Dispense: 90 tablet; Refill: 1  10. Acute pain of both knees Try salonpas OTC - meloxicam (MOBIC) 15 MG tablet; Take 1 tablet (15 mg total) by mouth daily.  Dispense: 30 tablet; Refill: 0    Labs pending Health maintenance reviewed Diet and exercise encouraged Continue all meds Follow up  In 3 months   [place], [name]

## 2016-08-30 NOTE — Patient Instructions (Signed)

## 2016-08-30 NOTE — Addendum Note (Signed)
Addended by: Rolena Infante on: 08/30/2016 12:07 PM   Modules accepted: Orders

## 2016-08-31 ENCOUNTER — Other Ambulatory Visit: Payer: Self-pay | Admitting: Nurse Practitioner

## 2016-08-31 DIAGNOSIS — E785 Hyperlipidemia, unspecified: Secondary | ICD-10-CM

## 2016-08-31 LAB — CBC WITH DIFFERENTIAL/PLATELET
Basophils Absolute: 0.1 10*3/uL (ref 0.0–0.2)
Basos: 1 %
EOS (ABSOLUTE): 0.3 10*3/uL (ref 0.0–0.4)
Eos: 7 %
Hematocrit: 31.7 % — ABNORMAL LOW (ref 37.5–51.0)
Hemoglobin: 10 g/dL — ABNORMAL LOW (ref 13.0–17.7)
Immature Grans (Abs): 0 10*3/uL (ref 0.0–0.1)
Immature Granulocytes: 0 %
Lymphocytes Absolute: 1.5 10*3/uL (ref 0.7–3.1)
Lymphs: 33 %
MCH: 29.6 pg (ref 26.6–33.0)
MCHC: 31.5 g/dL (ref 31.5–35.7)
MCV: 94 fL (ref 79–97)
Monocytes Absolute: 0.5 10*3/uL (ref 0.1–0.9)
Monocytes: 11 %
Neutrophils Absolute: 2.3 10*3/uL (ref 1.4–7.0)
Neutrophils: 48 %
Platelets: 151 10*3/uL (ref 150–379)
RBC: 3.38 x10E6/uL — ABNORMAL LOW (ref 4.14–5.80)
RDW: 14.1 % (ref 12.3–15.4)
WBC: 4.7 10*3/uL (ref 3.4–10.8)

## 2016-08-31 LAB — CMP14+EGFR
ALT: 10 IU/L (ref 0–44)
AST: 17 IU/L (ref 0–40)
Albumin/Globulin Ratio: 1.6 (ref 1.2–2.2)
Albumin: 4.2 g/dL (ref 3.5–4.7)
Alkaline Phosphatase: 61 IU/L (ref 39–117)
BUN/Creatinine Ratio: 15 (ref 10–24)
BUN: 23 mg/dL (ref 8–27)
Bilirubin Total: 0.3 mg/dL (ref 0.0–1.2)
CO2: 25 mmol/L (ref 20–29)
Calcium: 9.1 mg/dL (ref 8.6–10.2)
Chloride: 100 mmol/L (ref 96–106)
Creatinine, Ser: 1.55 mg/dL — ABNORMAL HIGH (ref 0.76–1.27)
GFR calc Af Amer: 46 mL/min/{1.73_m2} — ABNORMAL LOW (ref >59)
GFR calc non Af Amer: 40 mL/min/{1.73_m2} — ABNORMAL LOW (ref >59)
Globulin, Total: 2.7 g/dL (ref 1.5–4.5)
Glucose: 111 mg/dL — ABNORMAL HIGH (ref 65–99)
Potassium: 5.2 mmol/L (ref 3.5–5.2)
Sodium: 136 mmol/L (ref 134–144)
Total Protein: 6.9 g/dL (ref 6.0–8.5)

## 2016-08-31 LAB — LIPID PANEL
Chol/HDL Ratio: 3.2 ratio (ref 0.0–5.0)
Cholesterol, Total: 149 mg/dL (ref 100–199)
HDL: 47 mg/dL (ref >39)
LDL Calculated: 75 mg/dL (ref 0–99)
Triglycerides: 136 mg/dL (ref 0–149)
VLDL Cholesterol Cal: 27 mg/dL (ref 5–40)

## 2016-09-18 ENCOUNTER — Encounter: Payer: Self-pay | Admitting: Nurse Practitioner

## 2016-09-18 ENCOUNTER — Ambulatory Visit (INDEPENDENT_AMBULATORY_CARE_PROVIDER_SITE_OTHER): Payer: Medicare HMO | Admitting: Nurse Practitioner

## 2016-09-18 VITALS — BP 150/69 | HR 80 | Temp 97.1°F | Ht 67.0 in | Wt 135.0 lb

## 2016-09-18 DIAGNOSIS — F411 Generalized anxiety disorder: Secondary | ICD-10-CM | POA: Diagnosis not present

## 2016-09-18 DIAGNOSIS — N183 Chronic kidney disease, stage 3 unspecified: Secondary | ICD-10-CM

## 2016-09-18 DIAGNOSIS — I2510 Atherosclerotic heart disease of native coronary artery without angina pectoris: Secondary | ICD-10-CM

## 2016-09-18 DIAGNOSIS — E785 Hyperlipidemia, unspecified: Secondary | ICD-10-CM

## 2016-09-18 DIAGNOSIS — I10 Essential (primary) hypertension: Secondary | ICD-10-CM

## 2016-09-18 DIAGNOSIS — N400 Enlarged prostate without lower urinary tract symptoms: Secondary | ICD-10-CM | POA: Diagnosis not present

## 2016-09-18 DIAGNOSIS — C3490 Malignant neoplasm of unspecified part of unspecified bronchus or lung: Secondary | ICD-10-CM | POA: Diagnosis not present

## 2016-09-18 DIAGNOSIS — J439 Emphysema, unspecified: Secondary | ICD-10-CM | POA: Diagnosis not present

## 2016-09-18 DIAGNOSIS — H6123 Impacted cerumen, bilateral: Secondary | ICD-10-CM

## 2016-09-18 LAB — LIPID PANEL
Chol/HDL Ratio: 2.5 ratio (ref 0.0–5.0)
Cholesterol, Total: 143 mg/dL (ref 100–199)
HDL: 58 mg/dL (ref >39)
LDL Calculated: 66 mg/dL (ref 0–99)
Triglycerides: 95 mg/dL (ref 0–149)
VLDL Cholesterol Cal: 19 mg/dL (ref 5–40)

## 2016-09-18 LAB — CMP14+EGFR
ALT: 10 IU/L (ref 0–44)
AST: 20 IU/L (ref 0–40)
Albumin/Globulin Ratio: 1.5 (ref 1.2–2.2)
Albumin: 4.3 g/dL (ref 3.5–4.7)
Alkaline Phosphatase: 61 IU/L (ref 39–117)
BUN/Creatinine Ratio: 12 (ref 10–24)
BUN: 17 mg/dL (ref 8–27)
Bilirubin Total: <0.2 mg/dL (ref 0.0–1.2)
CO2: 26 mmol/L (ref 20–29)
Calcium: 10 mg/dL (ref 8.6–10.2)
Chloride: 103 mmol/L (ref 96–106)
Creatinine, Ser: 1.4 mg/dL — ABNORMAL HIGH (ref 0.76–1.27)
GFR calc Af Amer: 52 mL/min/{1.73_m2} — ABNORMAL LOW (ref >59)
GFR calc non Af Amer: 45 mL/min/{1.73_m2} — ABNORMAL LOW (ref >59)
Globulin, Total: 2.9 g/dL (ref 1.5–4.5)
Glucose: 94 mg/dL (ref 65–99)
Potassium: 5.2 mmol/L (ref 3.5–5.2)
Sodium: 140 mmol/L (ref 134–144)
Total Protein: 7.2 g/dL (ref 6.0–8.5)

## 2016-09-18 MED ORDER — DOXAZOSIN MESYLATE 2 MG PO TABS: 2.0000 mg | ORAL_TABLET | Freq: Every day | ORAL | 1 refills | Status: DC

## 2016-09-18 MED ORDER — ATORVASTATIN CALCIUM 10 MG PO TABS: 10.0000 mg | ORAL_TABLET | Freq: Every day | ORAL | 1 refills | Status: DC

## 2016-09-18 MED ORDER — LISINOPRIL 10 MG PO TABS: 10.0000 mg | ORAL_TABLET | Freq: Every day | ORAL | 1 refills | Status: DC

## 2016-09-18 MED ORDER — CLONAZEPAM 1 MG PO TABS: 1.0000 mg | ORAL_TABLET | Freq: Three times a day (TID) | ORAL | 1 refills | Status: DC | PRN

## 2016-09-18 NOTE — Patient Instructions (Signed)
Earwax Buildup, Adult The ears produce a substance called earwax that helps keep bacteria out of the ear and protects the skin in the ear canal. Occasionally, earwax can build up in the ear and cause discomfort or hearing loss. What increases the risk? This condition is more likely to develop in people who:  Are male.  Are elderly.  Naturally produce more earwax.  Clean their ears often with cotton swabs.  Use earplugs often.  Use in-ear headphones often.  Wear hearing aids.  Have narrow ear canals.  Have earwax that is overly thick or sticky.  Have eczema.  Are dehydrated.  Have excess hair in the ear canal.  What are the signs or symptoms? Symptoms of this condition include:  Reduced or muffled hearing.  A feeling of fullness in the ear or feeling that the ear is plugged.  Fluid coming from the ear.  Ear pain.  Ear itch.  Ringing in the ear.  Coughing.  An obvious piece of earwax that can be seen inside the ear canal.  How is this diagnosed? This condition may be diagnosed based on:  Your symptoms.  Your medical history.  An ear exam. During the exam, your health care provider will look into your ear with an instrument called an otoscope.  You may have tests, including a hearing test. How is this treated? This condition may be treated by:  Using ear drops to soften the earwax.  Having the earwax removed by a health care provider. The health care provider may: ? Flush the ear with water. ? Use an instrument that has a loop on the end (curette). ? Use a suction device.  Surgery to remove the wax buildup. This may be done in severe cases.  Follow these instructions at home:  Take over-the-counter and prescription medicines only as told by your health care provider.  Do not put any objects, including cotton swabs, into your ear. You can clean the opening of your ear canal with a washcloth or facial tissue.  Follow instructions from your health  care provider about cleaning your ears. Do not over-clean your ears.  Drink enough fluid to keep your urine clear or pale yellow. This will help to thin the earwax.  Keep all follow-up visits as told by your health care provider. If earwax builds up in your ears often or if you use hearing aids, consider seeing your health care provider for routine, preventive ear cleanings. Ask your health care provider how often you should schedule your cleanings.  If you have hearing aids, clean them according to instructions from the manufacturer and your health care provider. Contact a health care provider if:  You have ear pain.  You develop a fever.  You have blood, pus, or other fluid coming from your ear.  You have hearing loss.  You have ringing in your ears that does not go away.  Your symptoms do not improve with treatment.  You feel like the room is spinning (vertigo). Summary  Earwax can build up in the ear and cause discomfort or hearing loss.  The most common symptoms of this condition include reduced or muffled hearing and a feeling of fullness in the ear or feeling that the ear is plugged.  This condition may be diagnosed based on your symptoms, your medical history, and an ear exam.  This condition may be treated by using ear drops to soften the earwax or by having the earwax removed by a health care provider.  Do   not put any objects, including cotton swabs, into your ear. You can clean the opening of your ear canal with a washcloth or facial tissue. This information is not intended to replace advice given to you by your health care provider. Make sure you discuss any questions you have with your health care provider. Document Released: 03/01/2004 Document Revised: 04/04/2016 Document Reviewed: 04/04/2016 Elsevier Interactive Patient Education  2018 [employer]  

## 2016-09-18 NOTE — Progress Notes (Signed)
Subjective:    Patient ID: Steven [name], male    DOB: 1928-10-31, 83 y.o.   MRN: [id number]  HPI  TRAVARUS TRUDO is here today for follow up of chronic medical problem.  Outpatient Encounter Prescriptions as of 09/18/2016  Medication Sig  . albuterol (PROVENTIL HFA;VENTOLIN HFA) 108 (90 Base) MCG/ACT inhaler Inhale 1-2 puffs into the lungs every 4 (four) hours as needed for wheezing or shortness of breath.  [name] albuterol (PROVENTIL) (2.5 MG/3ML) 0.083% nebulizer solution Take 3 mLs (2.5 mg total) by nebulization every 2 (two) hours as needed for wheezing or shortness of breath.  [name] aspirin 81 MG tablet Take 81 mg by mouth daily.   [name] atorvastatin (LIPITOR) 10 MG tablet Take 1 tablet (10 mg total) by mouth daily.  . clonazePAM (KLONOPIN) 1 MG tablet Take 1 tablet (1 mg total) by mouth 3 (three) times daily as needed.  . doxazosin (CARDURA) 2 MG tablet Take 1 tablet (2 mg total) by mouth daily.  [name] lisinopril (PRINIVIL,ZESTRIL) 10 MG tablet Take 1 tablet (10 mg total) by mouth daily.  . meloxicam (MOBIC) 15 MG tablet Take 1 tablet (15 mg total) by mouth daily.  . Multiple Vitamins-Iron (MULTIVITAMINS WITH IRON) TABS tablet Take 1 tablet by mouth daily.  [name] Sodium (STOOL SOFTENER & LAXATIVE PO)    No facility-administered encounter medications on file as of 09/18/2016.     1. Essential hypertension  Patient denies any chest pain or headache. He does not check his blood pressure at home  2. Atherosclerosis of native coronary artery of native heart without angina pectoris Again denies chest pain   3. Squamous cell carcinoma of lung, unspecified laterality ([place])  currently not getting any treatment  4. Pulmonary emphysema, unspecified emphysema type ([place])  Stays SOB. He has quit smoking but is dipping  5. CKD (chronic kidney disease), stage III  We are currently just watching labs  6. Benign prostatic hyperplasia without lower urinary tract symptoms  Says that he is having  no problems voiding  7. Hyperlipidemia with target LDL less than 100  Does not watch diet  8. Anxiety state  Is taking klonopin 1-2 x a day    New complaints: Ears feel stopped up and he wants them irrigated  Social history: Lives alone    Review of Systems  Constitutional: Negative for activity change and appetite change.  HENT: Positive for hearing loss.   Eyes: Negative for pain.  Respiratory: Negative for shortness of breath.   Cardiovascular: Negative for chest pain, palpitations and leg swelling.  Gastrointestinal: Negative for abdominal pain.  Endocrine: Negative for polydipsia.  Genitourinary: Negative.   Skin: Negative for rash.  Neurological: Negative for dizziness, weakness and headaches.  Hematological: Does not bruise/bleed easily.  Psychiatric/Behavioral: Negative.   All other systems reviewed and are negative.      Objective:   Physical Exam  Constitutional: He is oriented to person, place, and time. He appears well-developed and well-nourished.  HENT:  Head: Normocephalic.  Right Ear: External ear normal.  Left Ear: External ear normal.  Nose: Nose normal.  Mouth/Throat: Oropharynx is clear and moist.  bil cerumen impaction  Eyes: Pupils are equal, round, and reactive to light. EOM are normal.  Neck: Normal range of motion. Neck supple. No JVD present. No thyromegaly present.  Cardiovascular: Normal rate, regular rhythm, normal heart sounds and intact distal pulses.  Exam reveals no gallop and no friction rub.   No murmur heard. Pulmonary/Chest: Effort  normal and breath sounds normal. No respiratory distress. He has no wheezes. He has no rales. He exhibits no tenderness.  Abdominal: Soft. Bowel sounds are normal. He exhibits no mass. There is no tenderness.  Genitourinary: Prostate normal and penis normal.  Musculoskeletal: Normal range of motion. He exhibits no edema.  Lymphadenopathy:    He has no cervical adenopathy.  Neurological: He is alert  and oriented to person, place, and time. No cranial nerve deficit.  Skin: Skin is warm and dry.  Psychiatric: He has a normal mood and affect. His behavior is normal. Judgment and thought content normal.   BP (!) 150/69   Pulse 80   Temp (!) 97.1 F (36.2 C) (Oral)   Ht 5' 7"  (1.702 m)   Wt 135 lb (61.2 kg)   BMI 21.14 kg/m   S/P bil irrigation of ear canals- TM'S clear bil    Assessment & Plan:  1. Essential hypertension Low sodium diet - lisinopril (PRINIVIL,ZESTRIL) 10 MG tablet; Take 1 tablet (10 mg total) by mouth daily.  Dispense: 90 tablet; Refill: 1 - CMP14+EGFR  2. Atherosclerosis of native coronary artery of native heart without angina pectoris To ER if develops chest pain  3. Squamous cell carcinoma of lung, unspecified laterality ([place])  4. Pulmonary emphysema, unspecified emphysema type ([place]) Continue nebulizer  5. CKD (chronic kidney disease), stage III Will check labs today  6. Benign prostatic hyperplasia without lower urinary tract symptoms Follow up with  - doxazosin (CARDURA) 2 MG tablet; Take 1 tablet (2 mg total) by mouth daily.  Dispense: 90 tablet; Refill: 1  7. Hyperlipidemia with target LDL less than 100 Low fat diet - atorvastatin (LIPITOR) 10 MG tablet; Take 1 tablet (10 mg total) by mouth daily.  Dispense: 90 tablet; Refill: 1 - Lipid panel  8. Anxiety state Stress management - clonazePAM (KLONOPIN) 1 MG tablet; Take 1 tablet (1 mg total) by mouth 3 (three) times daily as needed.  Dispense: 90 tablet; Refill: 1  9. Bilateral impacted cerumen Debrox OTC 2-3 x a week    Labs pending Health maintenance reviewed Diet and exercise encouraged Continue all meds Follow up  In 3 months   [place], [name]

## 2016-10-10 ENCOUNTER — Other Ambulatory Visit: Payer: Self-pay | Admitting: Nurse Practitioner

## 2016-10-10 DIAGNOSIS — M25561 Pain in right knee: Secondary | ICD-10-CM

## 2016-10-10 DIAGNOSIS — M25562 Pain in left knee: Principal | ICD-10-CM

## 2016-11-04 ENCOUNTER — Other Ambulatory Visit: Payer: Self-pay | Admitting: Nurse Practitioner

## 2016-11-04 DIAGNOSIS — F411 Generalized anxiety disorder: Secondary | ICD-10-CM

## 2016-11-05 NOTE — Telephone Encounter (Signed)
Last seen 09/18/16  MMM   IF approved route to nurse to call into [facility]

## 2016-11-05 NOTE — Telephone Encounter (Signed)
Rx called to pharmacy

## 2016-11-24 ENCOUNTER — Other Ambulatory Visit: Payer: Self-pay | Admitting: Nurse Practitioner

## 2016-11-24 DIAGNOSIS — I10 Essential (primary) hypertension: Secondary | ICD-10-CM

## 2016-12-03 ENCOUNTER — Ambulatory Visit: Payer: Medicare HMO | Admitting: Nurse Practitioner

## 2016-12-04 ENCOUNTER — Encounter: Payer: Self-pay | Admitting: Nurse Practitioner

## 2016-12-04 ENCOUNTER — Other Ambulatory Visit: Payer: Self-pay | Admitting: *Deleted

## 2016-12-04 ENCOUNTER — Ambulatory Visit (INDEPENDENT_AMBULATORY_CARE_PROVIDER_SITE_OTHER): Payer: Medicare HMO | Admitting: Nurse Practitioner

## 2016-12-04 VITALS — BP 113/43 | HR 94 | Temp 98.2°F | Ht 67.0 in | Wt 137.0 lb

## 2016-12-04 DIAGNOSIS — M25561 Pain in right knee: Secondary | ICD-10-CM

## 2016-12-04 DIAGNOSIS — N183 Chronic kidney disease, stage 3 unspecified: Secondary | ICD-10-CM

## 2016-12-04 DIAGNOSIS — I10 Essential (primary) hypertension: Secondary | ICD-10-CM

## 2016-12-04 DIAGNOSIS — J439 Emphysema, unspecified: Secondary | ICD-10-CM

## 2016-12-04 DIAGNOSIS — N400 Enlarged prostate without lower urinary tract symptoms: Secondary | ICD-10-CM

## 2016-12-04 DIAGNOSIS — F411 Generalized anxiety disorder: Secondary | ICD-10-CM

## 2016-12-04 DIAGNOSIS — E785 Hyperlipidemia, unspecified: Secondary | ICD-10-CM

## 2016-12-04 DIAGNOSIS — M25562 Pain in left knee: Secondary | ICD-10-CM | POA: Diagnosis not present

## 2016-12-04 MED ORDER — CLONAZEPAM 1 MG PO TABS: 1.0000 mg | ORAL_TABLET | Freq: Three times a day (TID) | ORAL | 2 refills | Status: DC | PRN

## 2016-12-04 MED ORDER — LISINOPRIL 10 MG PO TABS: 10.0000 mg | ORAL_TABLET | Freq: Every day | ORAL | 1 refills | Status: DC

## 2016-12-04 MED ORDER — MELOXICAM 15 MG PO TABS: 15.0000 mg | ORAL_TABLET | Freq: Every day | ORAL | 1 refills | Status: DC
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
## 2016-12-05 LAB — CMP14+EGFR
ALT: 12 IU/L (ref 0–44)
AST: 15 IU/L (ref 0–40)
Albumin/Globulin Ratio: 1.4 (ref 1.2–2.2)
Albumin: 4.1 g/dL (ref 3.5–4.7)
Alkaline Phosphatase: 61 IU/L (ref 39–117)
BUN/Creatinine Ratio: 17 (ref 10–24)
BUN: 27 mg/dL (ref 8–27)
Bilirubin Total: <0.2 mg/dL (ref 0.0–1.2)
CO2: 26 mmol/L (ref 20–29)
Calcium: 9.9 mg/dL (ref 8.6–10.2)
Chloride: 101 mmol/L (ref 96–106)
Creatinine, Ser: 1.62 mg/dL — ABNORMAL HIGH (ref 0.76–1.27)
GFR calc Af Amer: 44 mL/min/{1.73_m2} — ABNORMAL LOW (ref >59)
GFR calc non Af Amer: 38 mL/min/{1.73_m2} — ABNORMAL LOW (ref >59)
Globulin, Total: 3 g/dL (ref 1.5–4.5)
Glucose: 109 mg/dL — ABNORMAL HIGH (ref 65–99)
Potassium: 5.2 mmol/L (ref 3.5–5.2)
Sodium: 139 mmol/L (ref 134–144)
Total Protein: 7.1 g/dL (ref 6.0–8.5)

## 2016-12-05 LAB — LIPID PANEL
Chol/HDL Ratio: 3.1 ratio (ref 0.0–5.0)
Cholesterol, Total: 173 mg/dL (ref 100–199)
HDL: 55 mg/dL (ref >39)
LDL Calculated: 95 mg/dL (ref 0–99)
Triglycerides: 116 mg/dL (ref 0–149)
VLDL Cholesterol Cal: 23 mg/dL (ref 5–40)

## 2016-12-07 ENCOUNTER — Encounter: Payer: Self-pay | Admitting: Nurse Practitioner

## 2016-12-07 ENCOUNTER — Ambulatory Visit (INDEPENDENT_AMBULATORY_CARE_PROVIDER_SITE_OTHER): Payer: Medicare HMO | Admitting: Nurse Practitioner

## 2016-12-07 VITALS — BP 139/64 | HR 82 | Temp 98.4°F | Ht 67.0 in | Wt 138.0 lb

## 2016-12-07 DIAGNOSIS — R410 Disorientation, unspecified: Secondary | ICD-10-CM | POA: Diagnosis not present
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
## 2016-12-31 ENCOUNTER — Other Ambulatory Visit: Payer: Self-pay | Admitting: *Deleted

## 2017-01-08 ENCOUNTER — Other Ambulatory Visit: Payer: Self-pay | Admitting: *Deleted

## 2017-01-08 DIAGNOSIS — M25562 Pain in left knee: Principal | ICD-10-CM

## 2017-01-08 DIAGNOSIS — M25561 Pain in right knee: Secondary | ICD-10-CM

## 2017-01-08 MED ORDER — MELOXICAM 15 MG PO TABS: 15.0000 mg | ORAL_TABLET | Freq: Every day | ORAL | 0 refills | Status: DC

## 2017-01-15 ENCOUNTER — Other Ambulatory Visit: Payer: Self-pay | Admitting: Family

## 2017-01-15 DIAGNOSIS — F411 Generalized anxiety disorder: Secondary | ICD-10-CM

## 2017-01-17 ENCOUNTER — Other Ambulatory Visit: Payer: Self-pay | Admitting: Family

## 2017-01-17 DIAGNOSIS — F411 Generalized anxiety disorder: Secondary | ICD-10-CM

## 2017-01-18 NOTE — Telephone Encounter (Signed)
Please call in alprozolam with 1 refills

## 2017-01-18 NOTE — Telephone Encounter (Signed)
Last seen 11/18  MMM  If approved route to nurse [facility]

## 2017-01-18 NOTE — Telephone Encounter (Signed)
[facility] 

## 2017-02-18 ENCOUNTER — Ambulatory Visit (INDEPENDENT_AMBULATORY_CARE_PROVIDER_SITE_OTHER): Payer: Medicare HMO | Admitting: Nurse Practitioner

## 2017-02-18 ENCOUNTER — Encounter: Payer: Self-pay | Admitting: Nurse Practitioner

## 2017-02-18 VITALS — BP 153/64 | HR 72 | Temp 96.9°F | Ht 67.0 in | Wt 137.0 lb

## 2017-02-18 DIAGNOSIS — F411 Generalized anxiety disorder: Secondary | ICD-10-CM | POA: Diagnosis not present

## 2017-02-18 DIAGNOSIS — N400 Enlarged prostate without lower urinary tract symptoms: Secondary | ICD-10-CM

## 2017-02-18 DIAGNOSIS — I2510 Atherosclerotic heart disease of native coronary artery without angina pectoris: Secondary | ICD-10-CM

## 2017-02-18 DIAGNOSIS — N183 Chronic kidney disease, stage 3 unspecified: Secondary | ICD-10-CM

## 2017-02-18 DIAGNOSIS — J439 Emphysema, unspecified: Secondary | ICD-10-CM

## 2017-02-18 DIAGNOSIS — F17228 Nicotine dependence, chewing tobacco, with other nicotine-induced disorders: Secondary | ICD-10-CM | POA: Diagnosis not present

## 2017-02-18 DIAGNOSIS — I10 Essential (primary) hypertension: Secondary | ICD-10-CM

## 2017-02-18 DIAGNOSIS — E785 Hyperlipidemia, unspecified: Secondary | ICD-10-CM

## 2017-02-18 DIAGNOSIS — C3490 Malignant neoplasm of unspecified part of unspecified bronchus or lung: Secondary | ICD-10-CM | POA: Diagnosis not present

## 2017-02-18 MED ORDER — CLONAZEPAM 1 MG PO TABS: 1.0000 mg | ORAL_TABLET | Freq: Three times a day (TID) | ORAL | 2 refills | Status: DC | PRN

## 2017-02-18 MED ORDER — DOXAZOSIN MESYLATE 2 MG PO TABS: 2.0000 mg | ORAL_TABLET | Freq: Every day | ORAL | 1 refills | Status: DC

## 2017-02-18 MED ORDER — LISINOPRIL 10 MG PO TABS: 10.0000 mg | ORAL_TABLET | Freq: Every day | ORAL | 1 refills | Status: DC

## 2017-02-18 MED ORDER — ATORVASTATIN CALCIUM 10 MG PO TABS: 10.0000 mg | ORAL_TABLET | Freq: Every day | ORAL | 1 refills | Status: DC

## 2017-02-18 NOTE — Progress Notes (Signed)
Subjective:    Patient ID: Steven [name], male    DOB: 1928-07-24, 84 y.o.   MRN: [id number]  HPI   Steven [name] is here today for follow up of chronic medical problem.  Outpatient Encounter Medications as of 02/18/2017  Medication Sig  . aspirin 81 MG tablet Take 81 mg by mouth daily.   [name] atorvastatin (LIPITOR) 10 MG tablet Take 1 tablet (10 mg total) by mouth daily.  . clonazePAM (KLONOPIN) 1 MG tablet TAKE 1 TABLET BY MOUTH 3 TIMES A DAY AS NEEDED  . doxazosin (CARDURA) 2 MG tablet Take 1 tablet (2 mg total) by mouth daily.  [name] lisinopril (PRINIVIL,ZESTRIL) 10 MG tablet Take 1 tablet (10 mg total) by mouth daily.  . meloxicam (MOBIC) 15 MG tablet Take 1 tablet (15 mg total) by mouth daily.  [name] albuterol (PROVENTIL HFA;VENTOLIN HFA) 108 (90 Base) MCG/ACT inhaler Inhale 1-2 puffs into the lungs every 4 (four) hours as needed for wheezing or shortness of breath. (Patient not taking: Reported on 02/18/2017)  . albuterol (PROVENTIL) (2.5 MG/3ML) 0.083% nebulizer solution Take 3 mLs (2.5 mg total) by nebulization every 2 (two) hours as needed for wheezing or shortness of breath. (Patient not taking: Reported on 02/18/2017)  . Multiple Vitamins-Iron (MULTIVITAMINS WITH IRON) TABS tablet Take 1 tablet by mouth daily.  [name] Sodium (STOOL SOFTENER & LAXATIVE PO)      1. Essential hypertension  no c/o chest pain, sob or headache. Does not check blood pressure at home. BP Readings from Last 3 Encounters:  01/24/17 (!) 105/54  12/07/16 139/64  12/04/16 (!) 113/43     2. Atherosclerosis of native coronary artery of native heart without angina pectoris Seen on chest xray- currently just watching   3. Pulmonary emphysema, unspecified emphysema type ([place])  No longer smokes but chews tobacco instead. Has occasional cough  4. Squamous cell carcinoma of lung, unspecified laterality ([place])  Has not seen oncology in several years. Last chest xray was 03/29/16  5. Benign prostatic  hyperplasia without lower urinary tract symptoms  Has not had issues. Has not seen urology in quite some time  6. CKD (chronic kidney disease), stage III ([place])  Currently watching labs  7. Hyperlipidemia with target LDL less than 100  Not watching diet  8. Anxiety state  On klonopin 3x a day. Stays very anxious without meds  9. Chewing tobacco nicotine dependence with other nicotine-induced disorder  patient has no desire to stop.    New complaints: none  Social history: Lives alone   Review of Systems  Constitutional: Negative for activity change and appetite change.  HENT: Negative.   Eyes: Negative for pain.  Respiratory: Negative for shortness of breath.   Cardiovascular: Negative for chest pain, palpitations and leg swelling.  Gastrointestinal: Negative for abdominal pain.  Endocrine: Negative for polydipsia.  Genitourinary: Negative.   Skin: Negative for rash.  Neurological: Negative for dizziness, weakness and headaches.  Hematological: Does not bruise/bleed easily.  Psychiatric/Behavioral: Negative.   All other systems reviewed and are negative.      Objective:   Physical Exam  Constitutional: He is oriented to person, place, and time. He appears well-developed and well-nourished.  HENT:  Head: Normocephalic.  Right Ear: External ear normal.  Left Ear: External ear normal.  Nose: Nose normal.  Mouth/Throat: Oropharynx is clear and moist.  Eyes: EOM are normal. Pupils are equal, round, and reactive to light.  Neck: Normal range of motion. Neck supple. No JVD  present. No thyromegaly present.  Cardiovascular: Normal rate, regular rhythm, normal heart sounds and intact distal pulses. Exam reveals no gallop and no friction rub.  No murmur heard. Pulmonary/Chest: Effort normal and breath sounds normal. No respiratory distress. He has no wheezes. He has no rales. He exhibits no tenderness.  Abdominal: Soft. Bowel sounds are normal. He exhibits no mass. There is no  tenderness.  Genitourinary: Prostate normal and penis normal.  Musculoskeletal: Normal range of motion. He exhibits no edema.  Lymphadenopathy:    He has no cervical adenopathy.  Neurological: He is alert and oriented to person, place, and time. No cranial nerve deficit.  Skin: Skin is warm and dry.  Psychiatric: He has a normal mood and affect. His behavior is normal. Judgment and thought content normal.   BP (!) 153/64   Pulse 72   Temp (!) 96.9 F (36.1 C) (Oral)   Ht 5' 7"  (1.702 m)   Wt 137 lb (62.1 kg)   BMI 21.46 kg/m      Assessment & Plan:  1. Essential hypertension Low sodium diet - lisinopril (PRINIVIL,ZESTRIL) 10 MG tablet; Take 1 tablet (10 mg total) by mouth daily.  Dispense: 90 tablet; Refill: 1 - CMP14+EGFR  2. Atherosclerosis of native coronary artery of native heart without angina pectoris  3. Pulmonary emphysema, unspecified emphysema type ([place])  4. Squamous cell carcinoma of lung, unspecified laterality ([place])  5. Benign prostatic hyperplasia without lower urinary tract symptoms Force fluids - doxazosin (CARDURA) 2 MG tablet; Take 1 tablet (2 mg total) by mouth daily.  Dispense: 90 tablet; Refill: 1  6. CKD (chronic kidney disease), stage III ([place]) Labs pending  7. Hyperlipidemia with target LDL less than 100 Low fat diet - atorvastatin (LIPITOR) 10 MG tablet; Take 1 tablet (10 mg total) by mouth daily.  Dispense: 90 tablet; Refill: 1 - Lipid panel  8. Anxiety state Stress management - clonazePAM (KLONOPIN) 1 MG tablet; Take 1 tablet (1 mg total) by mouth 3 (three) times daily as needed.  Dispense: 90 tablet; Refill: 2  9. Chewing tobacco nicotine dependence with other nicotine-induced disorder encouaged  To stop using tobacco products.    Labs pending Health maintenance reviewed Diet and exercise encouraged Continue all meds Follow up  In 3 months   [place], [name]

## 2017-02-18 NOTE — Patient Instructions (Signed)
What You Need to Know About Smokeless Tobacco Use Tobacco use is one of the leading causes of cancer and other chronic health problems. Smokeless tobacco is tobacco that is put directly into the mouth instead of being smoked. It may also be called chewing tobacco or snuff. Smokeless tobacco is made from the leaves of tobacco plants and it comes in several forms:  Loose, dry leaves, plugs, or twists.  Moist pouches.  Dissolving lozenges or strips.  Chewing, sucking, or holding the tobacco in your mouth causes your mouth to make more saliva. The saliva mixes with the tobacco to make "tobacco juice" that is swallowed or spit out. How can smokeless tobacco affect me? Using smokeless tobacco:  Increases your risk of developing cancer. Smokeless tobacco contains at least 28 different types of cancer-causing chemicals (carcinogens).  Increases your chances of developing other long-term health problems, including high blood pressure, heart disease, stroke, and dental problems.  Can make you become addicted. Nicotine is one of the chemicals in tobacco. When you chew tobacco, you absorb nicotine from the tobacco juice. This can make you feel more alert than usual.  Can cause problems with pregnancy. Pregnant women who use smokeless tobacco are more likely to miscarry or deliver a baby too early (premature delivery).  Can affect the appearance and health of your mouth. Using smokeless tobacco may cause bad breath, yellow-brown teeth, mouth sores, cracking and bleeding lips, gum recession, and lesions on the soft tissues of your mouth (leukoplakia).  What are the benefits of not using smokeless tobacco? The benefits of not using smokeless tobacco include:  A healthy mind because: ? You avoid addiction.  A healthy body because: ? You avoid dental problems. ? You promote healthy pregnancy. ? You avoid long-term health problems.  A healthy wallet because: ? You avoid costs of buying  tobacco. ? You avoid health care costs in the future.  A healthy family because: ? You avoid accidental poisoning of children in your household.  What can happen if I continue to use smokeless tobacco? If you continue to use smokeless tobacco, you will increase your risk for developing certain cancers. These include:  Tongue.  Lips, mouth, and gums.  Throat (esophagus) and voice box (larynx).  Stomach.  Pancreas.  Bladder.  Colon.  Long-term use of smokeless tobacco can also lead to:  High blood pressure, heart disease, and stroke.  Gum disease, gum recession, and bone loss around the teeth.  Tooth decay.  How do I quit using smokeless tobacco? Quitting the use of smokeless tobacco can be hard, but it can be done. Follow these steps:  Pick a date to quit. Set a date within the next two weeks. This gives you time to prepare.  Write down the reasons why you are quitting. Keep this list in places where you will see it often, such as on your bathroom mirror or in your car or wallet.  Identify the people, places, things, and activities that make you want to use tobacco (triggers) and avoid them.  Get rid of any tobacco you have and remove any tobacco smells. To do this: ? Throw away all containers of tobacco at home, at work, and in your car. ? Throw away any other items that you use regularly when you chew tobacco. ? Clean your car and make sure to remove all tobacco-related items. ? Clean your home, including curtains and carpets.  Tell your family, friends, and coworkers that you are quitting. This can make quitting  easier.  Ask your health care provider for help quitting smokeless tobacco. This may involve treatment. Find out what treatment options are covered by your health insurance.  Keep track of how many days have passed since you quit. Remembering how long and hard you have worked to quit can help you avoid using tobacco again.  Where can I get support? Ask  your health care provider if there is a local support group for quitting smokeless tobacco. Where can I get more information? You can learn more about the risks of using smokeless tobacco and the benefits of quitting from these sources:  [place]: www.cancer.gov  American Cancer Society: www.cancer.org  When should I seek medical care? Seek medical care if you have:  White or other discolored patches in your mouth.  Difficulty swallowing.  A change in your voice.  Unexplained weight loss.  Stomach pain, nausea, or vomiting.  Summary  Smokeless tobacco contains at least 28 different chemicals that are known to cause cancer (carcinogen).  Nicotine is an addictive chemical in smokeless tobacco.  When you quit using smokeless tobacco, you lower your risk of developing cancer. This information is not intended to replace advice given to you by your health care provider. Make sure you discuss any questions you have with your health care provider. Document Released: 06/26/2010 Document Revised: 09/17/2015 Document Reviewed: 09/03/2014 Elsevier Interactive Patient Education  [employer].

## 2017-02-19 LAB — CMP14+EGFR
ALT: 13 IU/L (ref 0–44)
AST: 18 IU/L (ref 0–40)
Albumin/Globulin Ratio: 1.4 (ref 1.2–2.2)
Albumin: 4.1 g/dL (ref 3.5–4.7)
Alkaline Phosphatase: 58 IU/L (ref 39–117)
BUN/Creatinine Ratio: 13 (ref 10–24)
BUN: 20 mg/dL (ref 8–27)
Bilirubin Total: 0.2 mg/dL (ref 0.0–1.2)
CO2: 25 mmol/L (ref 20–29)
Calcium: 9.6 mg/dL (ref 8.6–10.2)
Chloride: 101 mmol/L (ref 96–106)
Creatinine, Ser: 1.54 mg/dL — ABNORMAL HIGH (ref 0.76–1.27)
GFR calc Af Amer: 47 mL/min/{1.73_m2} — ABNORMAL LOW (ref >59)
GFR calc non Af Amer: 40 mL/min/{1.73_m2} — ABNORMAL LOW (ref >59)
Globulin, Total: 2.9 g/dL (ref 1.5–4.5)
Glucose: 109 mg/dL — ABNORMAL HIGH (ref 65–99)
Potassium: 5.6 mmol/L — ABNORMAL HIGH (ref 3.5–5.2)
Sodium: 137 mmol/L (ref 134–144)
Total Protein: 7 g/dL (ref 6.0–8.5)

## 2017-02-19 LAB — LIPID PANEL
Chol/HDL Ratio: 2.2 ratio (ref 0.0–5.0)
Cholesterol, Total: 121 mg/dL (ref 100–199)
HDL: 56 mg/dL (ref >39)
LDL Calculated: 37 mg/dL (ref 0–99)
Triglycerides: 140 mg/dL (ref 0–149)
VLDL Cholesterol Cal: 28 mg/dL (ref 5–40)

## 2017-02-21 ENCOUNTER — Other Ambulatory Visit: Payer: Self-pay | Admitting: *Deleted

## 2017-02-21 DIAGNOSIS — E875 Hyperkalemia: Secondary | ICD-10-CM

## 2017-04-19 ENCOUNTER — Other Ambulatory Visit: Payer: Self-pay | Admitting: *Deleted

## 2017-04-19 DIAGNOSIS — M25561 Pain in right knee: Secondary | ICD-10-CM

## 2017-04-19 DIAGNOSIS — M25562 Pain in left knee: Principal | ICD-10-CM

## 2017-04-19 MED ORDER — MELOXICAM 15 MG PO TABS: 15.0000 mg | ORAL_TABLET | Freq: Every day | ORAL | 0 refills | Status: DC

## 2017-05-01 ENCOUNTER — Encounter: Payer: Self-pay | Admitting: Nurse Practitioner

## 2017-05-01 ENCOUNTER — Ambulatory Visit (INDEPENDENT_AMBULATORY_CARE_PROVIDER_SITE_OTHER): Payer: Medicare HMO

## 2017-05-01 ENCOUNTER — Ambulatory Visit (INDEPENDENT_AMBULATORY_CARE_PROVIDER_SITE_OTHER): Payer: Medicare HMO | Admitting: Nurse Practitioner

## 2017-05-01 VITALS — BP 184/78 | HR 69 | Temp 96.9°F | Ht 67.0 in | Wt 138.0 lb

## 2017-05-01 DIAGNOSIS — N183 Chronic kidney disease, stage 3 unspecified: Secondary | ICD-10-CM

## 2017-05-01 DIAGNOSIS — I10 Essential (primary) hypertension: Secondary | ICD-10-CM | POA: Diagnosis not present

## 2017-05-01 DIAGNOSIS — C3490 Malignant neoplasm of unspecified part of unspecified bronchus or lung: Secondary | ICD-10-CM

## 2017-05-01 DIAGNOSIS — J439 Emphysema, unspecified: Secondary | ICD-10-CM

## 2017-05-01 DIAGNOSIS — I2510 Atherosclerotic heart disease of native coronary artery without angina pectoris: Secondary | ICD-10-CM

## 2017-05-01 DIAGNOSIS — J449 Chronic obstructive pulmonary disease, unspecified: Secondary | ICD-10-CM | POA: Diagnosis not present

## 2017-05-01 DIAGNOSIS — E785 Hyperlipidemia, unspecified: Secondary | ICD-10-CM | POA: Diagnosis not present

## 2017-05-01 DIAGNOSIS — F411 Generalized anxiety disorder: Secondary | ICD-10-CM | POA: Diagnosis not present

## 2017-05-01 DIAGNOSIS — N400 Enlarged prostate without lower urinary tract symptoms: Secondary | ICD-10-CM | POA: Diagnosis not present

## 2017-05-01 MED ORDER — LISINOPRIL 20 MG PO TABS: 20.0000 mg | ORAL_TABLET | Freq: Every day | ORAL | 1 refills | Status: DC

## 2017-05-01 MED ORDER — ATORVASTATIN CALCIUM 10 MG PO TABS: 10.0000 mg | ORAL_TABLET | Freq: Every day | ORAL | 1 refills | Status: DC

## 2017-05-01 MED ORDER — DOXAZOSIN MESYLATE 2 MG PO TABS: 2.0000 mg | ORAL_TABLET | Freq: Every day | ORAL | 1 refills | Status: DC

## 2017-05-01 MED ORDER — CLONAZEPAM 1 MG PO TABS: 1.0000 mg | ORAL_TABLET | Freq: Three times a day (TID) | ORAL | 2 refills | Status: DC | PRN

## 2017-05-01 NOTE — Addendum Note (Signed)
Addended by: [name] on: 05/01/2017 10:17 AM   Modules accepted: Orders

## 2017-05-01 NOTE — Patient Instructions (Signed)
DASH Eating Plan DASH stands for "Dietary Approaches to Stop Hypertension." The DASH eating plan is a healthy eating plan that has been shown to reduce high blood pressure (hypertension). It may also reduce your risk for type 2 diabetes, heart disease, and stroke. The DASH eating plan may also help with weight loss. What are tips for following this plan? General guidelines  Avoid eating more than 2,300 mg (milligrams) of salt (sodium) a day. If you have hypertension, you may need to reduce your sodium intake to 1,500 mg a day.  Limit alcohol intake to no more than 1 drink a day for nonpregnant women and 2 drinks a day for men. One drink equals 12 oz of beer, 5 oz of wine, or 1 oz of hard liquor.  Work with your health care provider to maintain a healthy body weight or to lose weight. Ask what an ideal weight is for you.  Get at least 30 minutes of exercise that causes your heart to beat faster (aerobic exercise) most days of the week. Activities may include walking, swimming, or biking.  Work with your health care provider or diet and nutrition specialist (dietitian) to adjust your eating plan to your individual calorie needs. Reading food labels  Check food labels for the amount of sodium per serving. Choose foods with less than 5 percent of the Daily Value of sodium. Generally, foods with less than 300 mg of sodium per serving fit into this eating plan.  To find whole grains, look for the word "whole" as the first word in the ingredient list. Shopping  Buy products labeled as "low-sodium" or "no salt added."  Buy fresh foods. Avoid canned foods and premade or frozen meals. Cooking  Avoid adding salt when cooking. Use salt-free seasonings or herbs instead of table salt or sea salt. Check with your health care provider or pharmacist before using salt substitutes.  Do not fry foods. Cook foods using healthy methods such as baking, boiling, grilling, and broiling instead.  Cook with  heart-healthy oils, such as olive, canola, soybean, or sunflower oil. Meal planning   Eat a balanced diet that includes: ? 5 or more servings of fruits and vegetables each day. At each meal, try to fill half of your plate with fruits and vegetables. ? Up to 6-8 servings of whole grains each day. ? Less than 6 oz of lean meat, poultry, or fish each day. A 3-oz serving of meat is about the same size as a deck of cards. One egg equals 1 oz. ? 2 servings of low-fat dairy each day. ? A serving of nuts, seeds, or beans 5 times each week. ? Heart-healthy fats. Healthy fats called Omega-3 fatty acids are found in foods such as flaxseeds and coldwater fish, like sardines, salmon, and mackerel.  Limit how much you eat of the following: ? Canned or prepackaged foods. ? Food that is high in trans fat, such as fried foods. ? Food that is high in saturated fat, such as fatty meat. ? Sweets, desserts, sugary drinks, and other foods with added sugar. ? Full-fat dairy products.  Do not salt foods before eating.  Try to eat at least 2 vegetarian meals each week.  Eat more home-cooked food and less restaurant, buffet, and fast food.  When eating at a restaurant, ask that your food be prepared with less salt or no salt, if possible. What foods are recommended? The items listed may not be a complete list. Talk with your dietitian about what   dietary choices are best for you. Grains Whole-grain or whole-wheat bread. Whole-grain or whole-wheat pasta. Brown rice. Oatmeal. Quinoa. Bulgur. Whole-grain and low-sodium cereals. Pita bread. Low-fat, low-sodium crackers. Whole-wheat flour tortillas. Vegetables Fresh or frozen vegetables (raw, steamed, roasted, or grilled). Low-sodium or reduced-sodium tomato and vegetable juice. Low-sodium or reduced-sodium tomato sauce and tomato paste. Low-sodium or reduced-sodium canned vegetables. Fruits All fresh, dried, or frozen fruit. Canned fruit in natural juice (without  added sugar). Meat and other protein foods Skinless chicken or turkey. Ground chicken or turkey. Pork with fat trimmed off. Fish and seafood. Egg whites. Dried beans, peas, or lentils. Unsalted nuts, nut butters, and seeds. Unsalted canned beans. Lean cuts of beef with fat trimmed off. Low-sodium, lean deli meat. Dairy Low-fat (1%) or fat-free (skim) milk. Fat-free, low-fat, or reduced-fat cheeses. Nonfat, low-sodium ricotta or cottage cheese. Low-fat or nonfat yogurt. Low-fat, low-sodium cheese. Fats and oils Soft margarine without trans fats. Vegetable oil. Low-fat, reduced-fat, or light mayonnaise and salad dressings (reduced-sodium). Canola, safflower, olive, soybean, and sunflower oils. Avocado. Seasoning and other foods Herbs. Spices. Seasoning mixes without salt. Unsalted popcorn and pretzels. Fat-free sweets. What foods are not recommended? The items listed may not be a complete list. Talk with your dietitian about what dietary choices are best for you. Grains Baked goods made with fat, such as croissants, muffins, or some breads. Dry pasta or rice meal packs. Vegetables Creamed or fried vegetables. Vegetables in a cheese sauce. Regular canned vegetables (not low-sodium or reduced-sodium). Regular canned tomato sauce and paste (not low-sodium or reduced-sodium). Regular tomato and vegetable juice (not low-sodium or reduced-sodium). Pickles. Olives. Fruits Canned fruit in a light or heavy syrup. Fried fruit. Fruit in cream or butter sauce. Meat and other protein foods Fatty cuts of meat. Ribs. Fried meat. Bacon. Sausage. Bologna and other processed lunch meats. Salami. Fatback. Hotdogs. Bratwurst. Salted nuts and seeds. Canned beans with added salt. Canned or smoked fish. Whole eggs or egg yolks. Chicken or turkey with skin. Dairy Whole or 2% milk, cream, and half-and-half. Whole or full-fat cream cheese. Whole-fat or sweetened yogurt. Full-fat cheese. Nondairy creamers. Whipped toppings.  Processed cheese and cheese spreads. Fats and oils Butter. Stick margarine. Lard. Shortening. Ghee. Bacon fat. Tropical oils, such as coconut, palm kernel, or palm oil. Seasoning and other foods Salted popcorn and pretzels. Onion salt, garlic salt, seasoned salt, table salt, and sea salt. Worcestershire sauce. Tartar sauce. Barbecue sauce. Teriyaki sauce. Soy sauce, including reduced-sodium. Steak sauce. Canned and packaged gravies. Fish sauce. Oyster sauce. Cocktail sauce. Horseradish that you find on the shelf. Ketchup. Mustard. Meat flavorings and tenderizers. Bouillon cubes. Hot sauce and Tabasco sauce. Premade or packaged marinades. Premade or packaged taco seasonings. Relishes. Regular salad dressings. Where to find more information:  National Heart, Lung, and Blood Institute: www.nhlbi.nih.gov  American Heart Association: www.heart.org Summary  The DASH eating plan is a healthy eating plan that has been shown to reduce high blood pressure (hypertension). It may also reduce your risk for type 2 diabetes, heart disease, and stroke.  With the DASH eating plan, you should limit salt (sodium) intake to 2,300 mg a day. If you have hypertension, you may need to reduce your sodium intake to 1,500 mg a day.  When on the DASH eating plan, aim to eat more fresh fruits and vegetables, whole grains, lean proteins, low-fat dairy, and heart-healthy fats.  Work with your health care provider or diet and nutrition specialist (dietitian) to adjust your eating plan to your individual   calorie needs. This information is not intended to replace advice given to you by your health care provider. Make sure you discuss any questions you have with your health care provider. Document Released: 01/11/2011 Document Revised: 01/16/2016 Document Reviewed: 01/16/2016 Elsevier Interactive Patient Education  2018 Elsevier Inc.  

## 2017-05-01 NOTE — Progress Notes (Signed)
Subjective:    Patient ID: Steven [name], male    DOB: 07/27/1928, 84 y.o.   MRN: [id number]  HPI  Steven [name] is here today for follow up of chronic medical problem.  Outpatient Encounter Medications as of 05/01/2017  Medication Sig  . albuterol (PROVENTIL HFA;VENTOLIN HFA) 108 (90 Base) MCG/ACT inhaler Inhale 1-2 puffs into the lungs every 4 (four) hours as needed for wheezing or shortness of breath.  [name] albuterol (PROVENTIL) (2.5 MG/3ML) 0.083% nebulizer solution Take 3 mLs (2.5 mg total) by nebulization every 2 (two) hours as needed for wheezing or shortness of breath.  [name] aspirin 81 MG tablet Take 81 mg by mouth daily.   [name] atorvastatin (LIPITOR) 10 MG tablet Take 1 tablet (10 mg total) by mouth daily.  . clonazePAM (KLONOPIN) 1 MG tablet Take 1 tablet (1 mg total) by mouth 3 (three) times daily as needed.  . doxazosin (CARDURA) 2 MG tablet Take 1 tablet (2 mg total) by mouth daily.  [name] lisinopril (PRINIVIL,ZESTRIL) 10 MG tablet Take 1 tablet (10 mg total) by mouth daily.  . meloxicam (MOBIC) 15 MG tablet Take 1 tablet (15 mg total) by mouth daily.  . Multiple Vitamins-Iron (MULTIVITAMINS WITH IRON) TABS tablet Take 1 tablet by mouth daily.  [name] Sodium (STOOL SOFTENER & LAXATIVE PO)      1. Atherosclerosis of native coronary artery of native heart without angina pectoris  does not see cardiology. He says he is doing fine and has no need to see one.  2. Essential hypertension  No c/o chest ppain, sob or headache. He does not check his blood pressure at home. BP Readings from Last 3 Encounters:  05/01/17 (!) 191/77  02/18/17 (!) 153/64  01/24/17 (!) 105/54     3. Pulmonary emphysema, unspecified emphysema type ([place])  No chenge. Use to smoke but quit 12 years ago  4. Squamous cell carcinoma of lung, unspecified laterality ([place])  Has not seen oncology on years. Last chest xray in 03/2016 was clear other then severe COPD.  5. Benign prostatic hyperplasia without  lower urinary tract symptoms  denies any problems voiding or starting stream. Stream a litte weak but otherwise good. Does not want to see urology.  6. CKD (chronic kidney disease), stage III ([place])  Currently just watching labs  7. Anxiety state  Klonopin tid- gets very anxious if does not take  8. Hyperlipidemia with target LDL less than 100  Does not watch diet    New complaints: None today  Social history: Lives alone- does not have alert system. His family calls and checks on him daily   Review of Systems  Constitutional: Negative for activity change and appetite change.  HENT: Negative.   Eyes: Negative for pain.  Respiratory: Negative for shortness of breath.   Cardiovascular: Negative for chest pain, palpitations and leg swelling.  Gastrointestinal: Negative for abdominal pain.  Endocrine: Negative for polydipsia.  Genitourinary: Negative.   Skin: Negative for rash.  Neurological: Negative for dizziness, weakness and headaches.  Hematological: Does not bruise/bleed easily.  Psychiatric/Behavioral: Negative.   All other systems reviewed and are negative.      Objective:   Physical Exam  Constitutional: He is oriented to person, place, and time. He appears well-developed and well-nourished.  HENT:  Head: Normocephalic.  Right Ear: External ear normal.  Left Ear: External ear normal.  Nose: Nose normal.  Mouth/Throat: Oropharynx is clear and moist.  Eyes: Pupils are equal, round, and reactive to  light. EOM are normal.  Neck: Normal range of motion. Neck supple. No JVD present. No thyromegaly present.  Cardiovascular: Normal rate, regular rhythm, normal heart sounds and intact distal pulses. Exam reveals no gallop and no friction rub.  No murmur heard. Pulmonary/Chest: Effort normal and breath sounds normal. No respiratory distress. He has no wheezes. He has no rales. He exhibits no tenderness.  Abdominal: Soft. Bowel sounds are normal. He exhibits no mass. There is  no tenderness.  Musculoskeletal: Normal range of motion. He exhibits no edema.  Lymphadenopathy:    He has no cervical adenopathy.  Neurological: He is alert and oriented to person, place, and time. No cranial nerve deficit.  Skin: Skin is warm and dry.  Psychiatric: He has a normal mood and affect. His behavior is normal. Judgment and thought content normal.   BP (!) 184/78 (BP Location: Left Arm, Cuff Size: Normal)   Pulse 69   Temp (!) 96.9 F (36.1 C) (Oral)   Ht 5' 7"  (1.702 m)   Wt 138 lb (62.6 kg)   BMI 21.61 kg/m        Assessment & Plan:  1. Atherosclerosis of native coronary artery of native heart without angina pectoris To er or office if develops chest pain  2. Essential hypertension Low sodium diet Back on kisinopril with increase from 55m to 245m- CMP14+EGFR - lisinopril (PRINIVIL,ZESTRIL) 20 MG tablet; Take 1 tablet (20 mg total) by mouth daily.  Dispense: 90 tablet; Refill: 1  3. Pulmonary emphysema, unspecified emphysema type (H[place]ahChest rxay repeated  4. Squamous cell carcinoma of lung, unspecified laterality ([facility]) Repeated chest xray  5. Benign prostatic hyperplasia without lower urinary tract symptoms - PSA, total and free - doxazosin (CARDURA) 2 MG tablet; Take 1 tablet (2 mg total) by mouth daily.  Dispense: 90 tablet; Refill: 1  6. CKD (chronic kidney disease), stage III (HCWickliffeLabs pening  7. Anxiety state Stress management - clonazePAM (KLONOPIN) 1 MG tablet; Take 1 tablet (1 mg total) by mouth 3 (three) times daily as needed.  Dispense: 90 tablet; Refill: 2  8. Hyperlipidemia with target LDL less than 100 Low fat diet - Lipid panel - atorvastatin (LIPITOR) 10 MG tablet; Take 1 tablet (10 mg total) by mouth daily.  Dispense: 90 tablet; Refill: 1    Labs pending Health maintenance reviewed Diet and exercise encouraged Continue all meds Follow up  In 3 months   MaShawneeFNP

## 2017-05-02 LAB — CMP14+EGFR
ALT: 10 IU/L (ref 0–44)
AST: 18 IU/L (ref 0–40)
Albumin/Globulin Ratio: 1.4 (ref 1.2–2.2)
Albumin: 4.1 g/dL (ref 3.5–4.7)
Alkaline Phosphatase: 69 IU/L (ref 39–117)
BUN/Creatinine Ratio: 9 — ABNORMAL LOW (ref 10–24)
BUN: 16 mg/dL (ref 8–27)
Bilirubin Total: 0.3 mg/dL (ref 0.0–1.2)
CO2: 25 mmol/L (ref 20–29)
Calcium: 9.6 mg/dL (ref 8.6–10.2)
Chloride: 102 mmol/L (ref 96–106)
Creatinine, Ser: 1.74 mg/dL — ABNORMAL HIGH (ref 0.76–1.27)
GFR calc Af Amer: 40 mL/min/{1.73_m2} — ABNORMAL LOW (ref >59)
GFR calc non Af Amer: 34 mL/min/{1.73_m2} — ABNORMAL LOW (ref >59)
Globulin, Total: 2.9 g/dL (ref 1.5–4.5)
Glucose: 87 mg/dL (ref 65–99)
Potassium: 5.6 mmol/L — ABNORMAL HIGH (ref 3.5–5.2)
Sodium: 140 mmol/L (ref 134–144)
Total Protein: 7 g/dL (ref 6.0–8.5)

## 2017-05-02 LAB — LIPID PANEL
Chol/HDL Ratio: 2.3 ratio (ref 0.0–5.0)
Cholesterol, Total: 129 mg/dL (ref 100–199)
HDL: 56 mg/dL (ref >39)
LDL Calculated: 56 mg/dL (ref 0–99)
Triglycerides: 87 mg/dL (ref 0–149)
VLDL Cholesterol Cal: 17 mg/dL (ref 5–40)

## 2017-05-02 LAB — PSA, TOTAL AND FREE
PSA, Free Pct: 24.1 %
PSA, Free: 1.06 ng/mL
Prostate Specific Ag, Serum: 4.4 ng/mL — ABNORMAL HIGH (ref 0.0–4.0)

## 2017-05-03 ENCOUNTER — Other Ambulatory Visit: Payer: Medicare HMO

## 2017-05-03 DIAGNOSIS — E875 Hyperkalemia: Secondary | ICD-10-CM

## 2017-05-03 DIAGNOSIS — R739 Hyperglycemia, unspecified: Secondary | ICD-10-CM

## 2017-05-04 LAB — BMP8+EGFR
BUN/Creatinine Ratio: 10 (ref 10–24)
BUN: 17 mg/dL (ref 8–27)
CO2: 24 mmol/L (ref 20–29)
Calcium: 9.6 mg/dL (ref 8.6–10.2)
Chloride: 102 mmol/L (ref 96–106)
Creatinine, Ser: 1.71 mg/dL — ABNORMAL HIGH (ref 0.76–1.27)
GFR calc Af Amer: 41 mL/min/{1.73_m2} — ABNORMAL LOW (ref >59)
GFR calc non Af Amer: 35 mL/min/{1.73_m2} — ABNORMAL LOW (ref >59)
Glucose: 121 mg/dL — ABNORMAL HIGH (ref 65–99)
Potassium: 4.7 mmol/L (ref 3.5–5.2)
Sodium: 141 mmol/L (ref 134–144)

## 2017-05-07 DIAGNOSIS — R739 Hyperglycemia, unspecified: Secondary | ICD-10-CM | POA: Diagnosis not present

## 2017-05-07 LAB — BAYER DCA HB A1C WAIVED: HB A1C (BAYER DCA - WAIVED): 5.7 % (ref <7.0)

## 2017-06-07 ENCOUNTER — Emergency Department (HOSPITAL_COMMUNITY)
Admission: EM | Admit: 2017-06-07 | Discharge: 2017-06-07 | Disposition: A | Discharge status: Home / Self Care | Payer: Medicare HMO | Attending: Emergency Medicine | Admitting: Emergency Medicine

## 2017-06-07 ENCOUNTER — Other Ambulatory Visit: Payer: Self-pay

## 2017-06-07 ENCOUNTER — Encounter (HOSPITAL_COMMUNITY): Payer: Self-pay | Admitting: Emergency Medicine

## 2017-06-07 ENCOUNTER — Emergency Department (HOSPITAL_COMMUNITY): Payer: Medicare HMO

## 2017-06-07 DIAGNOSIS — I129 Hypertensive chronic kidney disease with stage 1 through stage 4 chronic kidney disease, or unspecified chronic kidney disease: Secondary | ICD-10-CM | POA: Insufficient documentation

## 2017-06-07 DIAGNOSIS — Z951 Presence of aortocoronary bypass graft: Secondary | ICD-10-CM | POA: Insufficient documentation

## 2017-06-07 DIAGNOSIS — J449 Chronic obstructive pulmonary disease, unspecified: Secondary | ICD-10-CM | POA: Diagnosis not present

## 2017-06-07 DIAGNOSIS — R531 Weakness: Secondary | ICD-10-CM | POA: Insufficient documentation

## 2017-06-07 DIAGNOSIS — Z85118 Personal history of other malignant neoplasm of bronchus and lung: Secondary | ICD-10-CM | POA: Diagnosis not present

## 2017-06-07 DIAGNOSIS — N179 Acute kidney failure, unspecified: Secondary | ICD-10-CM | POA: Insufficient documentation

## 2017-06-07 DIAGNOSIS — Z87891 Personal history of nicotine dependence: Secondary | ICD-10-CM | POA: Insufficient documentation

## 2017-06-07 DIAGNOSIS — R05 Cough: Secondary | ICD-10-CM | POA: Diagnosis not present

## 2017-06-07 DIAGNOSIS — Z79899 Other long term (current) drug therapy: Secondary | ICD-10-CM | POA: Diagnosis not present

## 2017-06-07 DIAGNOSIS — N183 Chronic kidney disease, stage 3 (moderate): Secondary | ICD-10-CM | POA: Diagnosis not present

## 2017-06-07 DIAGNOSIS — D649 Anemia, unspecified: Secondary | ICD-10-CM | POA: Diagnosis not present

## 2017-06-07 DIAGNOSIS — I252 Old myocardial infarction: Secondary | ICD-10-CM | POA: Insufficient documentation

## 2017-06-07 DIAGNOSIS — R110 Nausea: Secondary | ICD-10-CM | POA: Diagnosis not present

## 2017-06-07 DIAGNOSIS — B349 Viral infection, unspecified: Secondary | ICD-10-CM | POA: Diagnosis not present

## 2017-06-07 DIAGNOSIS — Z7982 Long term (current) use of aspirin: Secondary | ICD-10-CM | POA: Insufficient documentation

## 2017-06-07 DIAGNOSIS — R404 Transient alteration of awareness: Secondary | ICD-10-CM | POA: Diagnosis not present

## 2017-06-07 LAB — BASIC METABOLIC PANEL
Anion gap: 10 (ref 5–15)
BUN: 24 mg/dL — ABNORMAL HIGH (ref 6–20)
CO2: 25 mmol/L (ref 22–32)
Calcium: 9.3 mg/dL (ref 8.9–10.3)
Chloride: 102 mmol/L (ref 101–111)
Creatinine, Ser: 1.92 mg/dL — ABNORMAL HIGH (ref 0.61–1.24)
GFR calc Af Amer: 34 mL/min — ABNORMAL LOW (ref >60)
GFR calc non Af Amer: 30 mL/min — ABNORMAL LOW (ref >60)
Glucose, Bld: 91 mg/dL (ref 65–99)
Potassium: 5.2 mmol/L — ABNORMAL HIGH (ref 3.5–5.1)
Sodium: 137 mmol/L (ref 135–145)

## 2017-06-07 LAB — URINALYSIS, ROUTINE W REFLEX MICROSCOPIC
Bacteria, UA: NONE SEEN
Bilirubin Urine: NEGATIVE
Glucose, UA: NEGATIVE mg/dL
Hgb urine dipstick: NEGATIVE
Ketones, ur: NEGATIVE mg/dL
Nitrite: NEGATIVE
Protein, ur: NEGATIVE mg/dL
Specific Gravity, Urine: 1.013 (ref 1.005–1.030)
pH: 5 (ref 5.0–8.0)

## 2017-06-07 LAB — CBC WITH DIFFERENTIAL/PLATELET
Basophils Absolute: 0.1 10*3/uL (ref 0.0–0.1)
Basophils Relative: 1 %
Eosinophils Absolute: 0.3 10*3/uL (ref 0.0–0.7)
Eosinophils Relative: 5 %
HCT: 30.2 % — ABNORMAL LOW (ref 39.0–52.0)
Hemoglobin: 9.6 g/dL — ABNORMAL LOW (ref 13.0–17.0)
Lymphocytes Relative: 23 %
Lymphs Abs: 1.2 10*3/uL (ref 0.7–4.0)
MCH: 30 pg (ref 26.0–34.0)
MCHC: 31.8 g/dL (ref 30.0–36.0)
MCV: 94.4 fL (ref 78.0–100.0)
Monocytes Absolute: 0.4 10*3/uL (ref 0.1–1.0)
Monocytes Relative: 8 %
Neutro Abs: 3.2 10*3/uL (ref 1.7–7.7)
Neutrophils Relative %: 63 %
Platelets: 161 10*3/uL (ref 150–400)
RBC: 3.2 MIL/uL — ABNORMAL LOW (ref 4.22–5.81)
RDW: 14.3 % (ref 11.5–15.5)
WBC: 5.1 10*3/uL (ref 4.0–10.5)

## 2017-06-07 LAB — TROPONIN I: Troponin I: <0.03 ng/mL (ref <0.03)

## 2017-06-07 MED ORDER — SODIUM CHLORIDE 0.9 % IV BOLUS: 500.0000 mL | Freq: Once | INTRAVENOUS | Status: DC

## 2017-06-07 MED ORDER — ALBUTEROL SULFATE (2.5 MG/3ML) 0.083% IN NEBU
5.0000 mg | INHALATION_SOLUTION | Freq: Once | RESPIRATORY_TRACT | Status: AC
  Administered 2017-06-07: 5 mg via RESPIRATORY_TRACT
  Filled 2017-06-07: qty 6

## 2017-06-07 MED ORDER — SODIUM CHLORIDE 0.9 % IV BOLUS
500.0000 mL | Freq: Once | INTRAVENOUS | Status: AC
  Administered 2017-06-07: 500 mL via INTRAVENOUS

## 2017-06-07 NOTE — ED Triage Notes (Signed)
Nausea and weakness for over one week. Pt lives alone. CBG 84.

## 2017-06-07 NOTE — ED Triage Notes (Signed)
Pt also complaining of non-productive cough.

## 2017-06-07 NOTE — ED Notes (Signed)
Pt receiving breathing tx

## 2017-06-07 NOTE — ED Provider Notes (Signed)
[facility] EMERGENCY DEPARTMENT Provider Note   CSN: [id number] Arrival date & time: 06/07/17  0755     History   Chief Complaint Chief Complaint  Patient presents with  . Cough    HPI Steven [name] is a 84 y.o. male.  Patient complains of nausea, weakness, cough for 1 week.  Patient lives alone and does his normal ADLs.  No fever, sweats, chills, dysuria, chest pain, dyspnea.  Severity of symptoms is mild to moderate.  No mental status changes.     Past Medical History:  Diagnosis Date  . Anxiety   . Cancer [facility]) 2013   Lung - cancer free x3 years.   . Hyperlipidemia   . Hypertension   . Myocardial infarction [facility]) 1999    Patient Active Problem List   Diagnosis Date Noted  . Abdominal bruit 10/15/2014  . Nicotine addiction 09/28/2014  . CKD (chronic kidney disease), stage III ([place]) 09/28/2014  . BPH (benign prostatic hyperplasia) 02/25/2013  . Squamous cell carcinoma of lung ([place]) 11/13/2011  . COPD with emphysema ([place]) 07/29/2007  . Hyperlipidemia with target LDL less than 100 07/28/2007  . Anxiety state 07/28/2007  . ERECTILE DYSFUNCTION 07/28/2007  . Essential hypertension 07/28/2007  . Coronary atherosclerosis 07/28/2007    Past Surgical History:  Procedure Laterality Date  . CARPAL TUNNEL RELEASE  1980's   right and left  . CHOLECYSTECTOMY    . COLONOSCOPY N/A 09/29/2014   Procedure: COLONOSCOPY;  Surgeon: Rogene Houston, MD;  Location[facility];  Service: Endoscopy;  Laterality: N/A;  . CORONARY ARTERY BYPASS GRAFT  1999   5 grafts  . FLEXIBLE BRONCHOSCOPY  11/07/2011   Procedure: FLEXIBLE BRONCHOSCOPY;  Surgeon: Gaye Pollack, MD;  Location: [place];  Service: Thoracic;  Laterality: N/A;  . HERNIA REPAIR  5277   umbilical hernia repair  . JOINT REPLACEMENT  2000   right shoulder rotator cuff repair        Home Medications    Prior to Admission medications   Medication Sig Start Date End Date Taking? Authorizing Provider  albuterol  (PROVENTIL HFA;VENTOLIN HFA) 108 (90 Base) MCG/ACT inhaler Inhale 1-2 puffs into the lungs every 4 (four) hours as needed for wheezing or shortness of breath. 03/18/16   Eber Jones, MD  albuterol (PROVENTIL) (2.5 MG/3ML) 0.083% nebulizer solution Take 3 mLs (2.5 mg total) by nebulization every 2 (two) hours as needed for wheezing or shortness of breath. 03/18/16   Eber Jones, MD  aspirin 81 MG tablet Take 81 mg by mouth daily.  07/19/15   [provider]  atorvastatin (LIPITOR) 10 MG tablet Take 1 tablet (10 mg total) by mouth daily. 05/01/17   Hassell Done, Mary-Margaret, [name]  clonazePAM (KLONOPIN) 1 MG tablet Take 1 tablet (1 mg total) by mouth 3 (three) times daily as needed. 05/01/17   Hassell Done Mary-Margaret, [name]  doxazosin (CARDURA) 2 MG tablet Take 1 tablet (2 mg total) by mouth daily. 05/01/17   Hassell Done, Mary-Margaret, [name]  lisinopril (PRINIVIL,ZESTRIL) 20 MG tablet Take 1 tablet (20 mg total) by mouth daily. 05/01/17   Hassell Done, Mary-Margaret, [name]  meloxicam (MOBIC) 15 MG tablet Take 1 tablet (15 mg total) by mouth daily. 04/19/17   Hassell Done Mary-Margaret, [name]  Multiple Vitamins-Iron (MULTIVITAMINS WITH IRON) TABS tablet Take 1 tablet by mouth daily.    [provider]  Sennosides-Docusate Sodium (STOOL SOFTENER & LAXATIVE PO)  07/19/15   [provider]    Family History Family History  Problem Relation Age of Onset  . Cancer Mother   . Pulmonary embolism Father     Social History Social History   Tobacco Use  . Smoking status: Former Smoker    Packs/day: 2.00    Years: 73.00    Pack years: 146.00    Types: Cigarettes    Start date: 02/05/2006    Last attempt to quit: 11/05/2006    Years since quitting: 10.5  . Smokeless tobacco: Never Used  Substance Use Topics  . Alcohol use: No  . Drug use: No     Allergies   Patient has no known allergies.   Review of Systems Review of Systems  All other systems reviewed and are  negative.    Physical Exam Updated Vital Signs BP (!) 132/50   Pulse 74   Temp 98.5 F (36.9 C) (Oral)   Resp 17   SpO2 97%   Physical Exam  Constitutional: He is oriented to person, place, and time.  Frail, dehydrated  HENT:  Head: Normocephalic and atraumatic.  Eyes: Conjunctivae are normal.  Neck: Neck supple.  Cardiovascular: Normal rate and regular rhythm.  Pulmonary/Chest: Effort normal and breath sounds normal.  Abdominal: Soft. Bowel sounds are normal.  Musculoskeletal: Normal range of motion.  Neurological: He is alert and oriented to person, place, and time.  Skin: Skin is warm and dry.  Psychiatric: He has a normal mood and affect. His behavior is normal.  Nursing note and vitals reviewed.    ED Treatments / Results  Labs (all labs ordered are listed, but only abnormal results are displayed) Labs Reviewed  CBC WITH DIFFERENTIAL/PLATELET - Abnormal; Notable for the following components:      Result Value   RBC 3.20 (*)    Hemoglobin 9.6 (*)    HCT 30.2 (*)    All other components within normal limits  BASIC METABOLIC PANEL - Abnormal; Notable for the following components:   Potassium 5.2 (*)    BUN 24 (*)    Creatinine, Ser 1.92 (*)    GFR calc non Af Amer 30 (*)    GFR calc Af Amer 34 (*)    All other components within normal limits  URINALYSIS, ROUTINE W REFLEX MICROSCOPIC - Abnormal; Notable for the following components:   Leukocytes, UA TRACE (*)    All other components within normal limits  TROPONIN I    EKG EKG Interpretation  Date/Time:  Friday Jun 07 2017 08:05:37 EDT Ventricular Rate:  92 PR Interval:    QRS Duration: 96 QT Interval:  306 QTC Calculation: 379 R Axis:   80 Text Interpretation:  Sinus rhythm Borderline repolarization abnormality Confirmed by Nat Christen [phone]) on 06/07/2017 10:15:59 AM   Radiology Dg Chest 2 View  Result Date: 06/07/2017 CLINICAL DATA:  Cough, weakness, nausea, history of lung carcinoma EXAM: CHEST  - 2 VIEW COMPARISON:  Chest x-ray of 05/01/2017 FINDINGS: The lungs are clear but hyperaerated consistent with emphysema. Mediastinal and hilar contours are unremarkable and heart size is stable. Median sternotomy sutures are noted from CABG. No acute bony abnormality is seen. IMPRESSION: Hyperaeration consistent with emphysema.  No active lung disease. Electronically Signed   By: Ivar Drape M.D.   On: 06/07/2017 09:34    Procedures Procedures (including critical care time)  Medications Ordered in ED Medications  sodium chloride 0.9 % bolus 500 mL (0 mLs Intravenous Stopped 06/07/17 1117)  albuterol (PROVENTIL) (2.5 MG/3ML) 0.083% nebulizer solution 5 mg (5 mg Nebulization Given 06/07/17 0831)  sodium chloride 0.9 % bolus 500 mL (0 mLs Intravenous Stopped 06/07/17 1034)     Initial Impression / Assessment and Plan / ED Course  I have reviewed the triage vital signs and the nursing notes.  Pertinent labs & imaging results that were available during my care of the patient were reviewed by me and considered in my medical decision making (see chart for details).     Patient presents with viral-like syndrome.  He is anemic which is stable.  Additionally his creatinine is slightly above his baseline.  Potassium 5.2.  He feels much better after 1 L of IV fluids.  He feels like he can go home and take care of himself.  Will follow up with his primary care doctor.  Final Clinical Impressions(s) / ED Diagnoses   Final diagnoses:  Viral syndrome  Anemia, unspecified type  AKI (acute kidney injury) [facility])    ED Discharge Orders    None       Nat Christen, MD 06/07/17 1513

## 2017-06-07 NOTE — Discharge Instructions (Addendum)
Tests showed no life-threatening condition.  Your kidney function and potassium are slightly elevated.  You are also anemic.  Increase fluids.  Recommend follow-up with your primary care doctor next week for recheck of your blood work.

## 2017-06-07 NOTE — ED Notes (Signed)
Pt taken to xray 

## 2017-06-13 ENCOUNTER — Other Ambulatory Visit: Payer: Self-pay

## 2017-06-13 ENCOUNTER — Emergency Department (HOSPITAL_COMMUNITY): Payer: Medicare HMO

## 2017-06-13 ENCOUNTER — Encounter: Payer: Self-pay | Admitting: Family

## 2017-06-13 ENCOUNTER — Emergency Department (HOSPITAL_COMMUNITY)
Admission: EM | Admit: 2017-06-13 | Discharge: 2017-06-13 | Disposition: A | Discharge status: Home / Self Care | Payer: Medicare HMO | Attending: Emergency Medicine | Admitting: Emergency Medicine

## 2017-06-13 ENCOUNTER — Encounter (HOSPITAL_COMMUNITY): Payer: Self-pay | Admitting: *Deleted

## 2017-06-13 ENCOUNTER — Ambulatory Visit (INDEPENDENT_AMBULATORY_CARE_PROVIDER_SITE_OTHER): Payer: Medicare HMO | Admitting: Family

## 2017-06-13 VITALS — BP 86/46 | HR 86 | Temp 98.4°F | Ht 67.0 in | Wt 136.0 lb

## 2017-06-13 DIAGNOSIS — I959 Hypotension, unspecified: Secondary | ICD-10-CM

## 2017-06-13 DIAGNOSIS — J449 Chronic obstructive pulmonary disease, unspecified: Secondary | ICD-10-CM | POA: Insufficient documentation

## 2017-06-13 DIAGNOSIS — R531 Weakness: Secondary | ICD-10-CM

## 2017-06-13 DIAGNOSIS — N183 Chronic kidney disease, stage 3 (moderate): Secondary | ICD-10-CM | POA: Diagnosis not present

## 2017-06-13 DIAGNOSIS — I252 Old myocardial infarction: Secondary | ICD-10-CM | POA: Diagnosis not present

## 2017-06-13 DIAGNOSIS — Z951 Presence of aortocoronary bypass graft: Secondary | ICD-10-CM | POA: Insufficient documentation

## 2017-06-13 DIAGNOSIS — Z96611 Presence of right artificial shoulder joint: Secondary | ICD-10-CM | POA: Diagnosis not present

## 2017-06-13 DIAGNOSIS — Z85118 Personal history of other malignant neoplasm of bronchus and lung: Secondary | ICD-10-CM | POA: Insufficient documentation

## 2017-06-13 DIAGNOSIS — J439 Emphysema, unspecified: Secondary | ICD-10-CM | POA: Diagnosis not present

## 2017-06-13 DIAGNOSIS — I2510 Atherosclerotic heart disease of native coronary artery without angina pectoris: Secondary | ICD-10-CM | POA: Insufficient documentation

## 2017-06-13 DIAGNOSIS — D649 Anemia, unspecified: Secondary | ICD-10-CM

## 2017-06-13 DIAGNOSIS — R404 Transient alteration of awareness: Secondary | ICD-10-CM | POA: Diagnosis not present

## 2017-06-13 DIAGNOSIS — I129 Hypertensive chronic kidney disease with stage 1 through stage 4 chronic kidney disease, or unspecified chronic kidney disease: Secondary | ICD-10-CM | POA: Insufficient documentation

## 2017-06-13 DIAGNOSIS — R05 Cough: Secondary | ICD-10-CM | POA: Diagnosis not present

## 2017-06-13 DIAGNOSIS — Z87891 Personal history of nicotine dependence: Secondary | ICD-10-CM | POA: Insufficient documentation

## 2017-06-13 LAB — URINALYSIS, ROUTINE W REFLEX MICROSCOPIC
Bilirubin Urine: NEGATIVE
Glucose, UA: NEGATIVE mg/dL
Hgb urine dipstick: NEGATIVE
Ketones, ur: NEGATIVE mg/dL
Nitrite: NEGATIVE
Protein, ur: NEGATIVE mg/dL
Specific Gravity, Urine: 1.012 (ref 1.005–1.030)
pH: 6 (ref 5.0–8.0)

## 2017-06-13 LAB — COMPREHENSIVE METABOLIC PANEL
ALT: 13 U/L — ABNORMAL LOW (ref 17–63)
AST: 15 U/L (ref 15–41)
Albumin: 3.3 g/dL — ABNORMAL LOW (ref 3.5–5.0)
Alkaline Phosphatase: 58 U/L (ref 38–126)
Anion gap: 8 (ref 5–15)
BUN: 25 mg/dL — ABNORMAL HIGH (ref 6–20)
CO2: 26 mmol/L (ref 22–32)
Calcium: 8.9 mg/dL (ref 8.9–10.3)
Chloride: 104 mmol/L (ref 101–111)
Creatinine, Ser: 2.35 mg/dL — ABNORMAL HIGH (ref 0.61–1.24)
GFR calc Af Amer: 27 mL/min — ABNORMAL LOW (ref >60)
GFR calc non Af Amer: 23 mL/min — ABNORMAL LOW (ref >60)
Glucose, Bld: 108 mg/dL — ABNORMAL HIGH (ref 65–99)
Potassium: 4.6 mmol/L (ref 3.5–5.1)
Sodium: 138 mmol/L (ref 135–145)
Total Bilirubin: 0.6 mg/dL (ref 0.3–1.2)
Total Protein: 6.7 g/dL (ref 6.5–8.1)

## 2017-06-13 LAB — I-STAT CG4 LACTIC ACID, ED
Lactic Acid, Venous: 1.02 mmol/L (ref 0.5–1.9)
Lactic Acid, Venous: 0.74 mmol/L (ref 0.5–1.9)

## 2017-06-13 LAB — CBC WITH DIFFERENTIAL/PLATELET
Basophils Absolute: 0 10*3/uL (ref 0.0–0.1)
Basophils Relative: 1 %
Eosinophils Absolute: 0.3 10*3/uL (ref 0.0–0.7)
Eosinophils Relative: 3 %
HCT: 28.3 % — ABNORMAL LOW (ref 39.0–52.0)
Hemoglobin: 9 g/dL — ABNORMAL LOW (ref 13.0–17.0)
Lymphocytes Relative: 18 %
Lymphs Abs: 1.6 10*3/uL (ref 0.7–4.0)
MCH: 29.9 pg (ref 26.0–34.0)
MCHC: 31.8 g/dL (ref 30.0–36.0)
MCV: 94 fL (ref 78.0–100.0)
Monocytes Absolute: 0.9 10*3/uL (ref 0.1–1.0)
Monocytes Relative: 10 %
Neutro Abs: 6.1 10*3/uL (ref 1.7–7.7)
Neutrophils Relative %: 68 %
Platelets: 160 10*3/uL (ref 150–400)
RBC: 3.01 MIL/uL — ABNORMAL LOW (ref 4.22–5.81)
RDW: 14.4 % (ref 11.5–15.5)
WBC: 8.8 10*3/uL (ref 4.0–10.5)

## 2017-06-13 LAB — POC OCCULT BLOOD, ED: Fecal Occult Bld: NEGATIVE

## 2017-06-13 LAB — PROTIME-INR
INR: 1.16
Prothrombin Time: 14.7 seconds (ref 11.4–15.2)

## 2017-06-13 LAB — TROPONIN I
Troponin I: <0.03 ng/mL (ref <0.03)
Troponin I: <0.03 ng/mL (ref <0.03)

## 2017-06-13 LAB — APTT: aPTT: 31 seconds (ref 24–36)

## 2017-06-13 MED ORDER — SODIUM CHLORIDE 0.9 % IV BOLUS
500.0000 mL | Freq: Once | INTRAVENOUS | Status: AC
  Administered 2017-06-13: 500 mL via INTRAVENOUS

## 2017-06-13 NOTE — ED Provider Notes (Signed)
Emergency Department Provider Note   I have reviewed the triage vital signs and the nursing notes.   HISTORY  Chief Complaint Weakness and Hypotension   HPI Steven [name] is a 84 y.o. male with PMH of lung cancer in remission, CKD, COPD, HLD, HTN, and CAD presents to the emergency department for evaluation of generalized weakness has been present for 1 to 2 weeks but worsening significantly over the past couple of days.  The patient went to his primary care provider who found him to be hypotensive in the office with initial systolic blood pressure of 82.  He improved to 90/46 after 500 ml of IVF with EMS.  Patient states that he has not had any pain in his chest or abdomen.  He has an ongoing cough which she was evaluated for an early May but this has not worsened significantly since then.  He denies any fevers or chills.  He does have a remote history of GI bleeding but denies any gross blood or black in his bowel movements.  He has been compliant with his home medications and has not started any new medicines.  No vomiting or diarrhea.  Patient states that typically his blood pressure is high, not low.  Past Medical History:  Diagnosis Date  . Anxiety   . Cancer [facility]) 2013   Lung - cancer free x3 years.   . Hyperlipidemia   . Hypertension   . Myocardial infarction [facility]) 1999    Patient Active Problem List   Diagnosis Date Noted  . Abdominal bruit 10/15/2014  . CKD (chronic kidney disease), stage III ([place]) 09/28/2014  . BPH (benign prostatic hyperplasia) 02/25/2013  . Squamous cell carcinoma of lung ([place]) 11/13/2011  . COPD with emphysema ([place]) 07/29/2007  . Hyperlipidemia with target LDL less than 100 07/28/2007  . Anxiety state 07/28/2007  . ERECTILE DYSFUNCTION 07/28/2007  . Essential hypertension 07/28/2007  . Coronary atherosclerosis 07/28/2007    Past Surgical History:  Procedure Laterality Date  . CARPAL TUNNEL RELEASE  1980's   right and left  . CHOLECYSTECTOMY     . COLONOSCOPY N/A 09/29/2014   Procedure: COLONOSCOPY;  Surgeon: Rogene Houston, MD;  [facility];  Service: Endoscopy;  Laterality: N/A;  . CORONARY ARTERY BYPASS GRAFT  1999   5 grafts  . FLEXIBLE BRONCHOSCOPY  11/07/2011   Procedure: FLEXIBLE BRONCHOSCOPY;  Surgeon: Gaye Pollack, MD;  Location: [place];  Service: Thoracic;  Laterality: N/A;  . HERNIA REPAIR  1884   umbilical hernia repair  . JOINT REPLACEMENT  2000   right shoulder rotator cuff repair    Current Outpatient Rx  . Order #: [id number] Class: Normal  . Order #: [id number] Class: Normal  . Order #: [id number] Class: Historical Med  . Order #: [id number] Class: Normal  . Order #: [id number] Class: Historical Med  . Order #: [id number] Class: Normal  . Order #: [id number] Class: Normal  . Order #: [id number] Class: Normal  . Order #: [id number] Class: Normal  . Order #: [id number] Class: Historical Med    Allergies Patient has no known allergies.  Family History  Problem Relation Age of Onset  . Cancer Mother   . Pulmonary embolism Father     Social History Social History   Tobacco Use  . Smoking status: Former Smoker    Packs/day: 2.00    Years: 73.00    Pack years: 146.00    Types: Cigarettes    Start date: 02/05/2006    Last attempt to quit: 11/05/2006  Years since quitting: 10.6  . Smokeless tobacco: Never Used  Substance Use Topics  . Alcohol use: No  . Drug use: No    Review of Systems  Constitutional: No fever/chills. Positive generalized weakness.  Eyes: No visual changes. ENT: No sore throat. Cardiovascular: Denies chest pain. Respiratory: Denies shortness of breath. Gastrointestinal: No abdominal pain.  No nausea, no vomiting.  No diarrhea.  No constipation. Genitourinary: Negative for dysuria. Musculoskeletal: Negative for back pain. Skin: Negative for rash. Neurological: Negative for headaches, focal weakness or numbness.  10-point ROS otherwise  negative.  ____________________________________________   PHYSICAL EXAM:  VITAL SIGNS: ED Triage Vitals [06/13/17 1345]  Enc Vitals Group     BP (!) 113/44     Pulse Rate 75     Resp 18     Temp 98 F (36.7 C)     Temp Source Oral     SpO2 96 %     Weight 136 lb (61.7 kg)     Height 5\' 7"  (1.702 m)     Pain Score 0   Constitutional: Alert and oriented. Well appearing and in no acute distress. Eyes: Conjunctivae are pale.  Head: Atraumatic. Nose: No congestion/rhinnorhea. Mouth/Throat: Mucous membranes are slightly dry.  Neck: No stridor.   Cardiovascular: Normal rate, regular rhythm. Good peripheral circulation. Grossly normal heart sounds.   Respiratory: Normal respiratory effort.  No retractions. Lungs CTAB. Gastrointestinal: Soft and nontender. No distention. Rectal exam with no gross blood or melena.  Musculoskeletal: No lower extremity tenderness nor edema. No gross deformities of extremities. Neurologic:  Normal speech and language. No gross focal neurologic deficits are appreciated.  Skin:  Skin is warm, dry and intact. No rash noted.   ____________________________________________   LABS (all labs ordered are listed, but only abnormal results are displayed)  Labs Reviewed  COMPREHENSIVE METABOLIC PANEL - Abnormal; Notable for the following components:      Result Value   Glucose, Bld 108 (*)    BUN 25 (*)    Creatinine, Ser 2.35 (*)    Albumin 3.3 (*)    ALT 13 (*)    GFR calc non Af Amer 23 (*)    GFR calc Af Amer 27 (*)    All other components within normal limits  CBC WITH DIFFERENTIAL/PLATELET - Abnormal; Notable for the following components:   RBC 3.01 (*)    Hemoglobin 9.0 (*)    HCT 28.3 (*)    All other components within normal limits  URINALYSIS, ROUTINE W REFLEX MICROSCOPIC - Abnormal; Notable for the following components:   Leukocytes, UA SMALL (*)    Bacteria, UA RARE (*)    All other components within normal limits  CULTURE, BLOOD  (ROUTINE X 2)  CULTURE, BLOOD (ROUTINE X 2)  URINE CULTURE  TROPONIN I  APTT  PROTIME-INR  TROPONIN I  I-STAT CG4 LACTIC ACID, ED  POC OCCULT BLOOD, ED  I-STAT CG4 LACTIC ACID, ED   ____________________________________________  EKG   EKG Interpretation  Date/Time:  Thursday Jun 13 2017 13:43:37 EDT Ventricular Rate:  78 PR Interval:    QRS Duration: 99 QT Interval:  378 QTC Calculation: 431 R Axis:   83 Text Interpretation:  Sinus rhythm Borderline right axis deviation Borderline T abnormalities, lateral leads No STEMI.  Confirmed by Nanda Quinton [phone]) on 06/13/2017 1:55:55 PM       ____________________________________________  RADIOLOGY  Dg Chest 2 View  Result Date: 06/13/2017 CLINICAL DATA:  Cough for 1-2 weeks, weakness, hypertension.  History of lung cancer in 2013. Former smoker. EXAM: CHEST - 2 VIEW COMPARISON:  Chest x-ray dated 06/07/2017. Chest x-ray dated 03/15/2016. FINDINGS: Heart size and mediastinal contours are stable. Median sternotomy wires and surgical changes of presumed CABG. Lungs are clear. Perihilar regions appears stable. No pleural effusion or pneumothorax seen. Osseous structures about the chest are unremarkable. IMPRESSION: No active cardiopulmonary disease. No evidence of pneumonia or pulmonary edema. Electronically Signed   By: Franki Cabot M.D.   On: 06/13/2017 15:33    ____________________________________________   PROCEDURES  Procedure(s) performed:   Procedures  None ____________________________________________   INITIAL IMPRESSION / ASSESSMENT AND PLAN / ED COURSE  Pertinent labs & imaging results that were available during my care of the patient were reviewed by me and considered in my medical decision making (see chart for details).  Patient presents to the emergency department for evaluation of acute on chronic generalized weakness with hypotension at the PCP office.  The patient apparently has a history of gastrointestinal  bleeding but is not noticed any black or bloody stools.  On my exam the patient has no gross blood or melena.  Patient's hypotension has improved after initial 500 mL bolus given by EMS.  Will provide additional IV fluids.  Patient not giving a story convincing for sepsis.  Very low suspicion for PE, ACS, or vascular etiology for hypotension.  Plan for labs including urinalysis.  I have ordered a chest x-ray as well and will continue to follow closely.  No anti-biotics at this time, doubt sepsis.   03:50 PM Patient's lab work and chest x-ray reviewed with no acute findings.  The patient's hypotension has resolved with IV fluids.  He has chronic kidney disease which is slightly worse from labs drawn last week.  I discussed the results with the patient along with my desire to keep him in the hospital for additional IV fluids and monitoring given his hypotension earlier today.  The patient is adamant about not staying in the hospital.  He states his grandson is on the way to pick him up.   06:10 PM Repeat troponin is normal.  He has not been hypotensive here in the emergency department but did have some hypotension at the PCPs office.  He is feeling better after IV fluids.  Patient's grandson is at bedside.  I again advised that given his age we observe him overnight for any episodes of additional hypotension.  He again refuses this recommendation.  We discussed the risks and benefits of returning home.  He states that he will call his PCP first thing tomorrow morning and return with any new or worsening symptoms.  ____________________________________________  FINAL CLINICAL IMPRESSION(S) / ED DIAGNOSES  Final diagnoses:  Generalized weakness    MEDICATIONS GIVEN DURING THIS VISIT:  Medications  sodium chloride 0.9 % bolus 500 mL (0 mLs Intravenous Stopped 06/13/17 1533)    Note:  This document was prepared using Dragon voice recognition software and may include unintentional dictation  errors.  Nanda Quinton, MD Emergency Medicine    Long, [facility], MD 06/13/17 2115

## 2017-06-13 NOTE — Patient Instructions (Signed)
Hypotension Hypotension, commonly called low blood pressure, is when the force of blood pumping through your arteries is too weak. Arteries are blood vessels that carry blood from the heart throughout the body. When blood pressure is too low, you may not get enough blood to your brain or to the rest of your organs. This can cause weakness, light-headedness, rapid heartbeat, and fainting. Depending on the cause and severity, hypotension may be harmless (benign) or cause serious problems (critical). What are the causes? Possible causes of hypotension include:  Blood loss.  Loss of body fluids (dehydration).  Heart problems.  Hormone (endocrine) problems.  Pregnancy.  Severe infection.  Lack of certain nutrients.  Severe allergic reactions (anaphylaxis).  Certain medicines, such as blood pressure medicine or medicines that make the body lose excess fluids (diuretics). Sometimes, hypotension can be caused by not taking medicine as directed, such as taking too much of a certain medicine.  What increases the risk? Certain factors can make you more likely to develop hypotension, including:  Age. Risk increases as you get older.  Conditions that affect the heart or the central nervous system.  Taking certain medicines, such as blood pressure medicine or diuretics.  Being pregnant.  What are the signs or symptoms? Symptoms of this condition may include:  Weakness.  Light-headedness.  Dizziness.  Blurred vision.  Fatigue.  Rapid heartbeat.  Fainting, in severe cases.  How is this diagnosed? This condition is diagnosed based on:  Your medical history.  Your symptoms.  Your blood pressure measurement. Your health care provider will check your blood pressure when you are: ? Lying down. ? Sitting. ? Standing.  A blood pressure reading is recorded as two numbers, such as "120 over 80" (or 120/80). The first ("top") number is called the systolic pressure. It is a  measure of the pressure in your arteries as your heart beats. The second ("bottom") number is called the diastolic pressure. It is a measure of the pressure in your arteries when your heart relaxes between beats. Blood pressure is measured in a unit called mm Hg. Healthy blood pressure for adults is 120/80. If your blood pressure is below 90/60, you may be diagnosed with hypotension. Other information or tests that may be used to diagnose hypotension include:  Your other vital signs, such as your heart rate and temperature.  Blood tests.  Tilt table test. For this test, you will be safely secured to a table that moves you from a lying position to an upright position. Your heart rhythm and blood pressure will be monitored during the test.  How is this treated? Treatment for this condition may include:  Changing your diet. This may involve eating more salt (sodium) or drinking more water.  Taking medicines to raise your blood pressure.  Changing the dosage of certain medicines you are taking that might be lowering your blood pressure.  Wearing compression stockings. These stockings help to prevent blood clots and reduce swelling in your legs.  In some cases, you may need to go to the hospital for:  Fluid replacement. This means you will receive fluids through an IV tube.  Blood replacement. This means you will receive donated blood through an IV tube (transfusion).  Treating an infection or heart problems, if this applies.  Monitoring. You may need to be monitored while medicines that you are taking wear off.  Follow these instructions at home: Eating and drinking   Drink enough fluid to keep your urine clear or pale yellow.    Eat a healthy diet and follow instructions from your health care provider about eating or drinking restrictions. A healthy diet includes: ? Fresh fruits and vegetables. ? Whole grains. ? Lean meats. ? Low-fat dairy products.  Eat extra salt only as  directed. Do not add extra salt to your diet unless your health care provider told you to do that.  Eat frequent, small meals.  Avoid standing up suddenly after eating. Medicines  Take over-the-counter and prescription medicines only as told by your health care provider. ? Follow instructions from your health care provider about changing the dosage of your current medicines, if this applies. ? Do not stop or adjust any of your medicines on your own. General instructions  Wear compression stockings as told by your health care provider.  Get up slowly from lying down or sitting positions. This gives your blood pressure a chance to adjust.  Avoid hot showers and excessive heat as directed by your health care provider.  Return to your normal activities as told by your health care provider. Ask your health care provider what activities are safe for you.  Do not use any products that contain nicotine or tobacco, such as cigarettes and e-cigarettes. If you need help quitting, ask your health care provider.  Keep all follow-up visits as told by your health care provider. This is important. Contact a health care provider if:  You vomit.  You have diarrhea.  You have a fever for more than 2-3 days.  You feel more thirsty than usual.  You feel weak and tired. Get help right away if:  You have chest pain.  You have a fast or irregular heartbeat.  You develop numbness in any part of your body.  You cannot move your arms or your legs.  You have trouble speaking.  You become sweaty or feel light-headed.  You faint.  You feel short of breath.  You have trouble staying awake.  You feel confused. This information is not intended to replace advice given to you by your health care provider. Make sure you discuss any questions you have with your health care provider. Document Released: 01/22/2005 Document Revised: 08/12/2015 Document Reviewed: 07/14/2015 Elsevier Interactive  Patient Education  2018 Elsevier Inc.  

## 2017-06-13 NOTE — Progress Notes (Signed)
   Subjective:    Patient ID: Steven [name], male    DOB: 09-Dec-1928, 84 y.o.   MRN: [id number]  Chief Complaint  Patient presents with  . Fatigue  . Generalized Body Aches  . Nausea    Extremity Weakness   Pertinent negatives include no fever.  Cough  This is a recurrent problem. The current episode started 1 to 4 weeks ago. The problem has been unchanged. The problem occurs every few minutes. The cough is non-productive. Associated symptoms include shortness of breath and wheezing. Pertinent negatives include no chills, ear congestion, ear pain, fever, headaches or sore throat. He has tried rest for the symptoms. The treatment provided no relief. His past medical history is significant for COPD.      Review of Systems  Constitutional: Positive for fatigue. Negative for chills and fever.  HENT: Negative for ear pain, sinus pain and sore throat.   Respiratory: Positive for cough, shortness of breath and wheezing.   Musculoskeletal: Positive for extremity weakness.  Skin: Positive for pallor.  Neurological: Negative for headaches.  All other systems reviewed and are negative.      Objective:   Physical Exam  Constitutional: He is oriented to person, place, and time. He appears well-developed and well-nourished. No distress.  HENT:  Head: Normocephalic.  Right Ear: External ear normal.  Left Ear: External ear normal.  Mouth/Throat: Oropharynx is clear and moist.  Eyes: Pupils are equal, round, and reactive to light. Right eye exhibits no discharge. Left eye exhibits no discharge.  Neck: Normal range of motion. Neck supple. No thyromegaly present.  Cardiovascular: Normal rate, regular rhythm, normal heart sounds and intact distal pulses.  No murmur heard. Pulmonary/Chest: Effort normal. No respiratory distress. He has decreased breath sounds. He has no wheezes. He has rales.  Abdominal: Soft. Bowel sounds are normal. He exhibits no distension. There is no tenderness.    Musculoskeletal: He exhibits no edema or tenderness.  Generalized weakness  Neurological: He is alert and oriented to person, place, and time. He has normal reflexes. No cranial nerve deficit.  Skin: Skin is warm and dry. No rash noted. No erythema. There is pallor.  Psychiatric: He has a normal mood and affect. His behavior is normal. Judgment and thought content normal.  Vitals reviewed.     BP (!) 86/46   Pulse 86   Temp 98.4 F (36.9 C) (Oral)   Ht 5\' 7"  (1.702 m)   Wt 136 lb (61.7 kg)   BMI 21.30 kg/m      Assessment & Plan:  [name] was seen today for fatigue, generalized body aches and nausea.  Diagnoses and all orders for this visit:  Hypotension, unspecified hypotension type  Weakness  Pulmonary emphysema, unspecified emphysema type ([place])  Anemia, unspecified type    Given BP will call EMS and send to ED I have called pt's son and daughter Pt was discharged from [facility] on 06/07/17,    Evelina Dun, [name]

## 2017-06-13 NOTE — Discharge Instructions (Signed)
You were seen in the ED today with weakness. Your blood pressure was low at your Primary Care doctor's office. You need to call your PCP doctor tomorrow to arrange a close follow up appointment and call 911 immediately with any return of lightheadedness, chest pain, abdominal pain, or any other concerning symptoms for you.

## 2017-06-13 NOTE — ED Triage Notes (Signed)
Pt brought in by [facility] from [place] office. Pt c/o generalized weakness x couple of days. Pt was found to be hypotensive at the doctor's office and EMS was called. Pt has received 579ml of NS given by EMS. PCP was concerned for possible GI bleed. Pt denies dark and bloody stools, vomiting. SBP initially 82, BP now up to 90/46 after NS bolus. Pt alert and oriented upon arrival.

## 2017-06-14 ENCOUNTER — Ambulatory Visit (INDEPENDENT_AMBULATORY_CARE_PROVIDER_SITE_OTHER): Payer: Medicare HMO | Admitting: Nurse Practitioner

## 2017-06-14 ENCOUNTER — Encounter: Payer: Self-pay | Admitting: Nurse Practitioner

## 2017-06-14 VITALS — BP 126/63 | HR 82 | Temp 97.8°F | Ht 67.0 in | Wt 138.2 lb

## 2017-06-14 DIAGNOSIS — E860 Dehydration: Secondary | ICD-10-CM

## 2017-06-14 DIAGNOSIS — I9589 Other hypotension: Secondary | ICD-10-CM | POA: Diagnosis not present

## 2017-06-14 DIAGNOSIS — Z09 Encounter for follow-up examination after completed treatment for conditions other than malignant neoplasm: Secondary | ICD-10-CM

## 2017-06-14 DIAGNOSIS — E861 Hypovolemia: Secondary | ICD-10-CM

## 2017-06-14 NOTE — Progress Notes (Signed)
   Subjective:    Patient ID: Steven [name], male    DOB: 12/07/1928, 84 y.o.   MRN: [id number]   Chief Complaint: hospital follow up  HPI Patient was seen in office yesterday with by C. Hawks [name] c/o  Fatigue and body aches. When vital signs were taking his blood pressure was low at 86/46. AN iv was started and he was transported to [place]. He had labs drawn and chest xray which were all normal. They wanted to keep him over night for IV fluids but patient refused to stay in hospital. After getting IV fluids his blood pressure was normal and he was discharged home. Today he feels much better.   Review of Systems  Constitutional: Positive for fatigue (slight but not like yesterday). Negative for activity change and appetite change.  HENT: Negative.   Eyes: Negative for pain.  Respiratory: Negative for shortness of breath.   Cardiovascular: Negative for chest pain, palpitations and leg swelling.  Gastrointestinal: Negative for abdominal pain.  Endocrine: Negative for polydipsia.  Genitourinary: Negative.   Musculoskeletal: Negative.   Skin: Negative for rash.  Neurological: Negative for dizziness, weakness and headaches.  Hematological: Does not bruise/bleed easily.  Psychiatric/Behavioral: Negative.   All other systems reviewed and are negative.      Objective:   Physical Exam  Constitutional: He is oriented to person, place, and time. He appears well-nourished. No distress.  Cardiovascular: Normal rate and regular rhythm.  Pulmonary/Chest: Effort normal.  Abdominal: Soft.  Neurological: He is alert and oriented to person, place, and time.  Skin: Skin is warm and dry.  Psychiatric: He has a normal mood and affect. His behavior is normal. Judgment and thought content normal.   BP 126/63   Pulse 82   Temp 97.8 F (36.6 C) (Oral)   Ht 5\' 7"  (1.702 m)   Wt 138 lb 3.2 oz (62.7 kg)   BMI 21.65 kg/m         Assessment & Plan:  Steven [name] in today with chief  complaint of Follow-up   1. Hypotension due to hypovolemia 2. Dehydration 3. Hospital discharge follow-up Hospital records reviewed Probable dehydrataion- patient told to increase fluid by 2 glasses a day Watch for swelling RTO prn  Mary-Margaret Hassell Done, [name]

## 2017-06-15 LAB — URINE CULTURE
Culture: NO GROWTH
Special Requests: NORMAL

## 2017-06-18 LAB — CULTURE, BLOOD (ROUTINE X 2)
Culture: NO GROWTH
Culture: NO GROWTH
Special Requests: ADEQUATE

## 2017-07-20 ENCOUNTER — Other Ambulatory Visit: Payer: Self-pay | Admitting: Nurse Practitioner

## 2017-07-20 DIAGNOSIS — M25562 Pain in left knee: Principal | ICD-10-CM

## 2017-07-20 DIAGNOSIS — M25561 Pain in right knee: Secondary | ICD-10-CM

## 2017-08-01 ENCOUNTER — Ambulatory Visit (INDEPENDENT_AMBULATORY_CARE_PROVIDER_SITE_OTHER): Payer: Medicare HMO | Admitting: Nurse Practitioner

## 2017-08-01 ENCOUNTER — Encounter: Payer: Self-pay | Admitting: Nurse Practitioner

## 2017-08-01 VITALS — BP 120/51 | HR 105 | Temp 97.9°F | Ht 67.0 in | Wt 133.0 lb

## 2017-08-01 DIAGNOSIS — I2510 Atherosclerotic heart disease of native coronary artery without angina pectoris: Secondary | ICD-10-CM | POA: Diagnosis not present

## 2017-08-01 DIAGNOSIS — N183 Chronic kidney disease, stage 3 unspecified: Secondary | ICD-10-CM

## 2017-08-01 DIAGNOSIS — N400 Enlarged prostate without lower urinary tract symptoms: Secondary | ICD-10-CM

## 2017-08-01 DIAGNOSIS — E785 Hyperlipidemia, unspecified: Secondary | ICD-10-CM

## 2017-08-01 DIAGNOSIS — C3490 Malignant neoplasm of unspecified part of unspecified bronchus or lung: Secondary | ICD-10-CM

## 2017-08-01 DIAGNOSIS — I10 Essential (primary) hypertension: Secondary | ICD-10-CM

## 2017-08-01 DIAGNOSIS — J439 Emphysema, unspecified: Secondary | ICD-10-CM

## 2017-08-01 DIAGNOSIS — F411 Generalized anxiety disorder: Secondary | ICD-10-CM | POA: Diagnosis not present

## 2017-08-01 MED ORDER — ATORVASTATIN CALCIUM 10 MG PO TABS: 10.0000 mg | ORAL_TABLET | Freq: Every day | ORAL | 1 refills | Status: DC

## 2017-08-01 MED ORDER — CLONAZEPAM 1 MG PO TABS: 1.0000 mg | ORAL_TABLET | Freq: Three times a day (TID) | ORAL | 2 refills | Status: DC | PRN

## 2017-08-01 MED ORDER — LISINOPRIL 20 MG PO TABS: 20.0000 mg | ORAL_TABLET | Freq: Every day | ORAL | 1 refills | Status: DC

## 2017-08-01 MED ORDER — DOXAZOSIN MESYLATE 2 MG PO TABS: 2.0000 mg | ORAL_TABLET | Freq: Every day | ORAL | 1 refills | Status: DC

## 2017-08-01 NOTE — Patient Instructions (Signed)

## 2017-08-01 NOTE — Progress Notes (Signed)
Subjective:    Patient ID: Steven [name], male    DOB: May 27, 1928, 84 y.o.   MRN: [id number]    Chief Complaint: Medical Management of Chronic Issues   HPI:  1. Essential hypertension  No c/o chest pain, or headache. Does not check blood pressure at home. BP Readings from Last 3 Encounters:  08/01/17 (!) 120/51  06/14/17 126/63  06/13/17 (!) 166/65     2. Pulmonary emphysema, unspecified emphysema type ([place])  Has chronic cough. Has not smoked in over 11 years.  3. CKD (chronic kidney disease), stage III ([place])  last creatine was 2.35. When he was in hospital.  4. Benign prostatic hyperplasia without lower urinary tract symptoms  Denies any problems passing his water.  5. Hyperlipidemia with target LDL less than 100  Has very poor appetite.  6. Anxiety state  Says he is very nervous some days and not others  7. Atherosclerosis of native coronary artery of native heart without angina pectoris  has not seen cardiology on years. He went to er 06/13/17 with generalized weakness. Was dx with hypotension. h had normal troponin levels and ekg was nsr at rate of 70  8. Squamous cell carcinoma of lung, unspecified laterality ([place])  He had lung cancer years ago. He had chest x ray on 06/13/17 while in hospital that was normal according to report.    Outpatient Encounter Medications as of 08/01/2017  Medication Sig  . albuterol (PROVENTIL HFA;VENTOLIN HFA) 108 (90 Base) MCG/ACT inhaler Inhale 1-2 puffs into the lungs every 4 (four) hours as needed for wheezing or shortness of breath.  [name] albuterol (PROVENTIL) (2.5 MG/3ML) 0.083% nebulizer solution Take 3 mLs (2.5 mg total) by nebulization every 2 (two) hours as needed for wheezing or shortness of breath.  [name] aspirin 81 MG tablet Take 81 mg by mouth daily.   . calcium carbonate (TUMS - DOSED IN MG ELEMENTAL CALCIUM) 500 MG chewable tablet Chew 1 tablet by mouth daily.  . clonazePAM (KLONOPIN) 1 MG tablet Take 1 tablet (1 mg total) by mouth 3  (three) times daily as needed.  . doxazosin (CARDURA) 2 MG tablet Take 1 tablet (2 mg total) by mouth daily.  [name] lisinopril (PRINIVIL,ZESTRIL) 20 MG tablet Take 1 tablet (20 mg total) by mouth daily.  . meloxicam (MOBIC) 15 MG tablet TAKE 1 TABLET BY MOUTH EVERY DAY  . Multiple Vitamins-Iron (MULTIVITAMINS WITH IRON) TABS tablet Take 1 tablet by mouth daily.  [name] atorvastatin (LIPITOR) 10 MG tablet Take 1 tablet (10 mg total) by mouth daily.      New complaints: He thinks he has really gone down hill over the last several months. Says that he has become very weak and wonders how much longer he can take care of himself. His family only checks on him maybe 1x a month. He wants to go to rest home. Social history: He lives by hisself.    Review of Systems  Constitutional: Positive for activity change (greatlt decreased). Negative for appetite change.  HENT: Negative.   Eyes: Negative for pain.  Respiratory: Positive for cough and shortness of breath.   Cardiovascular: Negative for chest pain, palpitations and leg swelling.  Gastrointestinal: Negative for abdominal pain.  Endocrine: Negative for polydipsia.  Genitourinary: Negative.   Skin: Negative for rash.  Neurological: Positive for weakness. Negative for dizziness and headaches.  Hematological: Does not bruise/bleed easily.  Psychiatric/Behavioral: Negative.   All other systems reviewed and are negative.      Objective:  Physical Exam  Constitutional: He is oriented to person, place, and time. He appears well-developed and well-nourished.  HENT:  Head: Normocephalic.  Nose: Nose normal.  Mouth/Throat: Oropharynx is clear and moist.  Eyes: Pupils are equal, round, and reactive to light. EOM are normal.  Neck: Normal range of motion and phonation normal. Neck supple. No JVD present. Carotid bruit is not present. No thyroid mass and no thyromegaly present.  Cardiovascular: Normal rate and regular rhythm.  Pulmonary/Chest:  Effort normal. No respiratory distress. He has rales (right lower lobe).  Deep wet cough   Abdominal: Soft. Normal appearance, normal aorta and bowel sounds are normal. There is no tenderness.  Musculoskeletal: Normal range of motion.  Gait is unsteady today  Lymphadenopathy:    He has no cervical adenopathy.  Neurological: He is alert and oriented to person, place, and time.  Skin: Skin is warm and dry.  Psychiatric: He has a normal mood and affect. His behavior is normal. Judgment and thought content normal.  Nursing note and vitals reviewed.  BP (!) 120/51   Pulse (!) 105   Temp 97.9 F (36.6 C) (Oral)   Ht 5' 7"  (1.702 m)   Wt 133 lb (60.3 kg)   BMI 20.83 kg/m       Assessment & Plan:  Steven [name] comes in today with chief complaint of Medical Management of Chronic Issues   Diagnosis and orders addressed:  1. Essential hypertension Low sodium diet - lisinopril (PRINIVIL,ZESTRIL) 20 MG tablet; Take 1 tablet (20 mg total) by mouth daily.  Dispense: 90 tablet; Refill: 1 - CMP14+EGFR  2. Pulmonary emphysema, unspecified emphysema type ([place])  3. CKD (chronic kidney disease), stage III ([place])  4. Benign prostatic hyperplasia without lower urinary tract symptoms - doxazosin (CARDURA) 2 MG tablet; Take 1 tablet (2 mg total) by mouth daily.  Dispense: 90 tablet; Refill: 1  5. Hyperlipidemia with target LDL less than 100 Low fat diet - atorvastatin (LIPITOR) 10 MG tablet; Take 1 tablet (10 mg total) by mouth daily.  Dispense: 90 tablet; Refill: 1 - Lipid panel  6. Anxiety state Stress management - clonazePAM (KLONOPIN) 1 MG tablet; Take 1 tablet (1 mg total) by mouth 3 (three) times daily as needed.  Dispense: 90 tablet; Refill: 2  7. Atherosclerosis of native coronary artery of native heart without angina pectoris  8. Squamous cell carcinoma of lung, unspecified laterality ([place])   * patient has really gone down hill the last several months. I am gong to call his  son today and talk to him about his fathers weakness and I woryy about him being by hisself. Patient wants me to discuss him going into a "rest Home".  Labs pending Health Maintenance reviewed Diet and exercise encouraged  Follow up plan: 3 months and prn   Mary-Margaret Hassell Done, [name]

## 2017-08-06 ENCOUNTER — Ambulatory Visit (INDEPENDENT_AMBULATORY_CARE_PROVIDER_SITE_OTHER): Payer: Medicare HMO | Admitting: Nurse Practitioner

## 2017-08-06 ENCOUNTER — Ambulatory Visit (INDEPENDENT_AMBULATORY_CARE_PROVIDER_SITE_OTHER): Payer: Medicare HMO

## 2017-08-06 ENCOUNTER — Encounter: Payer: Self-pay | Admitting: Nurse Practitioner

## 2017-08-06 VITALS — BP 110/50 | HR 92 | Temp 97.6°F | Ht 67.0 in | Wt 133.0 lb

## 2017-08-06 DIAGNOSIS — M79604 Pain in right leg: Secondary | ICD-10-CM

## 2017-08-06 DIAGNOSIS — I499 Cardiac arrhythmia, unspecified: Secondary | ICD-10-CM

## 2017-08-06 DIAGNOSIS — R059 Cough, unspecified: Secondary | ICD-10-CM

## 2017-08-06 DIAGNOSIS — R05 Cough: Secondary | ICD-10-CM | POA: Diagnosis not present

## 2017-08-06 DIAGNOSIS — J181 Lobar pneumonia, unspecified organism: Secondary | ICD-10-CM

## 2017-08-06 DIAGNOSIS — M79605 Pain in left leg: Secondary | ICD-10-CM

## 2017-08-06 DIAGNOSIS — J189 Pneumonia, unspecified organism: Secondary | ICD-10-CM

## 2017-08-06 MED ORDER — GABAPENTIN 100 MG PO CAPS: 100.0000 mg | ORAL_CAPSULE | Freq: Two times a day (BID) | ORAL | 3 refills | Status: DC

## 2017-08-06 MED ORDER — AZITHROMYCIN 250 MG PO TABS: ORAL_TABLET | ORAL | 0 refills | Status: DC

## 2017-08-06 NOTE — Patient Instructions (Signed)

## 2017-08-06 NOTE — Addendum Note (Signed)
Addended by: Rolena Infante on: 08/06/2017 12:07 PM   Modules accepted: Orders

## 2017-08-06 NOTE — Progress Notes (Signed)
Subjective:    Patient ID: Steven [name], male    DOB: Sep 22, 1928, 84 y.o.   MRN: [id number]   Chief Complaint: Nasal Congestion and Can't walk per daughter   HPI: Patient is brought in by his daughter c/o trouble walking and some altered mental status. He say sthat his legs just hurt so bad that he has trouble walking. His daughter says that his memory has gotten worse and he mumbles a lot and talks about things in past but having trouble with short term memory loss. He is c/o of congestion and cough.    Outpatient Encounter Medications as of 08/06/2017  Medication Sig  . albuterol (PROVENTIL HFA;VENTOLIN HFA) 108 (90 Base) MCG/ACT inhaler Inhale 1-2 puffs into the lungs every 4 (four) hours as needed for wheezing or shortness of breath.  [name] albuterol (PROVENTIL) (2.5 MG/3ML) 0.083% nebulizer solution Take 3 mLs (2.5 mg total) by nebulization every 2 (two) hours as needed for wheezing or shortness of breath.  [name] aspirin 81 MG tablet Take 81 mg by mouth daily.   [name] atorvastatin (LIPITOR) 10 MG tablet Take 1 tablet (10 mg total) by mouth daily.  . calcium carbonate (TUMS - DOSED IN MG ELEMENTAL CALCIUM) 500 MG chewable tablet Chew 1 tablet by mouth daily.  . clonazePAM (KLONOPIN) 1 MG tablet Take 1 tablet (1 mg total) by mouth 3 (three) times daily as needed.  . doxazosin (CARDURA) 2 MG tablet Take 1 tablet (2 mg total) by mouth daily.  [name] lisinopril (PRINIVIL,ZESTRIL) 20 MG tablet Take 1 tablet (20 mg total) by mouth daily.  . Multiple Vitamins-Iron (MULTIVITAMINS WITH IRON) TABS tablet Take 1 tablet by mouth daily.    New complaints: None other then stated above  Social history: Lives by hisself. Daughter says she checks on him daily.  Review of Systems  Constitutional: Negative for activity change and appetite change.  HENT: Negative.   Eyes: Negative for pain.  Respiratory: Negative for shortness of breath.   Cardiovascular: Negative for chest pain, palpitations and leg swelling.    Gastrointestinal: Negative for abdominal pain.  Endocrine: Negative for polydipsia.  Genitourinary: Negative.   Skin: Negative for rash.  Neurological: Negative for dizziness, weakness and headaches.  Hematological: Does not bruise/bleed easily.  Psychiatric/Behavioral: Negative.   All other systems reviewed and are negative.      Objective:   Physical Exam  Constitutional: He appears well-developed and well-nourished. No distress.  HENT:  Right Ear: External ear normal.  Left Ear: External ear normal.  Mouth/Throat: Oropharynx is clear and moist.  Eyes: Pupils are equal, round, and reactive to light.  Neck: Normal range of motion. Neck supple.  Cardiovascular: Normal rate.  Pulmonary/Chest: Effort normal.  Neurological: He is alert. No cranial nerve deficit. Coordination normal.  Nursing note and vitals reviewed.  BP (!) 110/50   Pulse 92   Temp 97.6 F (36.4 C) (Oral)   Ht 5\' 7"  (1.702 m)   Wt 133 lb (60.3 kg)   BMI 20.83 kg/m   EKG- unchanged from previous- nsr with Rhys Martini, [name]  Chest x ray- right lower lobe infiltrate      Assessment & Plan:  Steven [name] comes in today with chief complaint of Nasal Congestion and Can't walk per daughter   Diagnosis and orders addressed:  1. Cough - DG Chest 2 View; Future  2. Irregular heart beat - EKG 12-Lead  3. Community acquired pneumonia of right lower lobe of lung ([place]) 1. Take meds  as prescribed 2. Use a cool mist humidifier especially during the winter months and when heat has been humid. 3. Use saline nose sprays frequently 4. Saline irrigations of the nose can be very helpful if done frequently.  * 4X daily for 1 week*  * Use of a nettie pot can be helpful with this. Follow directions with this* 5. Drink plenty of fluids 6. Keep thermostat turn down low 7.For any cough or congestion  Use plain Mucinex- regular strength or max strength is fine   * Children- consult with Pharmacist for  dosing 8. For fever or aces or pains- take tylenol or ibuprofen appropriate for age and weight.  * for fevers greater than 101 orally you may alternate ibuprofen and tylenol every  3 hours.    - azithromycin (ZITHROMAX Z-PAK) 250 MG tablet; As directed  Dispense: 6 tablet; Refill: 0  4. Bilateral leg pain Fall precautions Encouraged to get medical alert system in house - gabapentin (NEURONTIN) 100 MG capsule; Take 1 capsule (100 mg total) by mouth 2 (two) times daily.  Dispense: 60 capsule; Refill: 3   Labs pending Health Maintenance reviewed Diet and exercise encouraged  Follow up plan: 1 month   Ray[place], [name]

## 2017-08-07 ENCOUNTER — Encounter (HOSPITAL_COMMUNITY): Payer: Self-pay | Admitting: Emergency Medicine

## 2017-08-07 ENCOUNTER — Emergency Department (HOSPITAL_COMMUNITY): Payer: Medicare HMO

## 2017-08-07 ENCOUNTER — Other Ambulatory Visit: Payer: Self-pay | Admitting: Nurse Practitioner

## 2017-08-07 ENCOUNTER — Inpatient Hospital Stay (HOSPITAL_COMMUNITY)
Admission: EM | Admit: 2017-08-07 | Discharge: 2017-08-15 | DRG: 190 | Disposition: A | Discharge status: Skilled Nursing Facility | Payer: Medicare HMO | Attending: Internal Medicine | Admitting: Internal Medicine

## 2017-08-07 ENCOUNTER — Other Ambulatory Visit: Payer: Self-pay

## 2017-08-07 DIAGNOSIS — J440 Chronic obstructive pulmonary disease with acute lower respiratory infection: Principal | ICD-10-CM | POA: Diagnosis present

## 2017-08-07 DIAGNOSIS — Z7982 Long term (current) use of aspirin: Secondary | ICD-10-CM | POA: Diagnosis not present

## 2017-08-07 DIAGNOSIS — J181 Lobar pneumonia, unspecified organism: Secondary | ICD-10-CM | POA: Diagnosis not present

## 2017-08-07 DIAGNOSIS — D631 Anemia in chronic kidney disease: Secondary | ICD-10-CM | POA: Diagnosis present

## 2017-08-07 DIAGNOSIS — R279 Unspecified lack of coordination: Secondary | ICD-10-CM | POA: Diagnosis not present

## 2017-08-07 DIAGNOSIS — E872 Acidosis: Secondary | ICD-10-CM | POA: Diagnosis present

## 2017-08-07 DIAGNOSIS — J9621 Acute and chronic respiratory failure with hypoxia: Secondary | ICD-10-CM | POA: Diagnosis present

## 2017-08-07 DIAGNOSIS — D649 Anemia, unspecified: Secondary | ICD-10-CM

## 2017-08-07 DIAGNOSIS — Z87891 Personal history of nicotine dependence: Secondary | ICD-10-CM

## 2017-08-07 DIAGNOSIS — J9602 Acute respiratory failure with hypercapnia: Secondary | ICD-10-CM

## 2017-08-07 DIAGNOSIS — E785 Hyperlipidemia, unspecified: Secondary | ICD-10-CM | POA: Diagnosis present

## 2017-08-07 DIAGNOSIS — J439 Emphysema, unspecified: Secondary | ICD-10-CM | POA: Diagnosis not present

## 2017-08-07 DIAGNOSIS — I10 Essential (primary) hypertension: Secondary | ICD-10-CM | POA: Diagnosis not present

## 2017-08-07 DIAGNOSIS — Z79899 Other long term (current) drug therapy: Secondary | ICD-10-CM

## 2017-08-07 DIAGNOSIS — Z7189 Other specified counseling: Secondary | ICD-10-CM

## 2017-08-07 DIAGNOSIS — N183 Chronic kidney disease, stage 3 unspecified: Secondary | ICD-10-CM | POA: Diagnosis present

## 2017-08-07 DIAGNOSIS — Y95 Nosocomial condition: Secondary | ICD-10-CM | POA: Diagnosis present

## 2017-08-07 DIAGNOSIS — I129 Hypertensive chronic kidney disease with stage 1 through stage 4 chronic kidney disease, or unspecified chronic kidney disease: Secondary | ICD-10-CM | POA: Diagnosis not present

## 2017-08-07 DIAGNOSIS — J189 Pneumonia, unspecified organism: Secondary | ICD-10-CM | POA: Diagnosis present

## 2017-08-07 DIAGNOSIS — R0602 Shortness of breath: Secondary | ICD-10-CM

## 2017-08-07 DIAGNOSIS — Z7401 Bed confinement status: Secondary | ICD-10-CM | POA: Diagnosis not present

## 2017-08-07 DIAGNOSIS — F05 Delirium due to known physiological condition: Secondary | ICD-10-CM | POA: Diagnosis present

## 2017-08-07 DIAGNOSIS — Z85118 Personal history of other malignant neoplasm of bronchus and lung: Secondary | ICD-10-CM

## 2017-08-07 DIAGNOSIS — Z515 Encounter for palliative care: Secondary | ICD-10-CM

## 2017-08-07 DIAGNOSIS — I252 Old myocardial infarction: Secondary | ICD-10-CM

## 2017-08-07 DIAGNOSIS — R1312 Dysphagia, oropharyngeal phase: Secondary | ICD-10-CM | POA: Diagnosis not present

## 2017-08-07 DIAGNOSIS — N179 Acute kidney failure, unspecified: Secondary | ICD-10-CM | POA: Diagnosis present

## 2017-08-07 DIAGNOSIS — F0391 Unspecified dementia with behavioral disturbance: Secondary | ICD-10-CM | POA: Diagnosis present

## 2017-08-07 DIAGNOSIS — E611 Iron deficiency: Secondary | ICD-10-CM | POA: Diagnosis present

## 2017-08-07 DIAGNOSIS — Z951 Presence of aortocoronary bypass graft: Secondary | ICD-10-CM | POA: Diagnosis not present

## 2017-08-07 DIAGNOSIS — J9622 Acute and chronic respiratory failure with hypercapnia: Secondary | ICD-10-CM | POA: Diagnosis present

## 2017-08-07 DIAGNOSIS — R531 Weakness: Secondary | ICD-10-CM | POA: Diagnosis not present

## 2017-08-07 DIAGNOSIS — N400 Enlarged prostate without lower urinary tract symptoms: Secondary | ICD-10-CM | POA: Diagnosis present

## 2017-08-07 DIAGNOSIS — F411 Generalized anxiety disorder: Secondary | ICD-10-CM | POA: Diagnosis present

## 2017-08-07 DIAGNOSIS — R41841 Cognitive communication deficit: Secondary | ICD-10-CM | POA: Diagnosis not present

## 2017-08-07 DIAGNOSIS — E870 Hyperosmolality and hypernatremia: Secondary | ICD-10-CM | POA: Diagnosis present

## 2017-08-07 DIAGNOSIS — J441 Chronic obstructive pulmonary disease with (acute) exacerbation: Secondary | ICD-10-CM | POA: Diagnosis not present

## 2017-08-07 DIAGNOSIS — E875 Hyperkalemia: Secondary | ICD-10-CM | POA: Diagnosis present

## 2017-08-07 DIAGNOSIS — R404 Transient alteration of awareness: Secondary | ICD-10-CM | POA: Diagnosis not present

## 2017-08-07 DIAGNOSIS — M6281 Muscle weakness (generalized): Secondary | ICD-10-CM | POA: Diagnosis not present

## 2017-08-07 DIAGNOSIS — J449 Chronic obstructive pulmonary disease, unspecified: Secondary | ICD-10-CM | POA: Diagnosis not present

## 2017-08-07 DIAGNOSIS — R1310 Dysphagia, unspecified: Secondary | ICD-10-CM | POA: Diagnosis present

## 2017-08-07 DIAGNOSIS — J9601 Acute respiratory failure with hypoxia: Secondary | ICD-10-CM | POA: Diagnosis present

## 2017-08-07 LAB — CMP14+EGFR
ALT: 27 IU/L (ref 0–44)
AST: 24 IU/L (ref 0–40)
Albumin/Globulin Ratio: 0.9 — ABNORMAL LOW (ref 1.2–2.2)
Albumin: 3.1 g/dL — ABNORMAL LOW (ref 3.5–4.7)
Alkaline Phosphatase: 73 IU/L (ref 39–117)
BUN/Creatinine Ratio: 20 (ref 10–24)
BUN: 34 mg/dL — ABNORMAL HIGH (ref 8–27)
Bilirubin Total: <0.2 mg/dL (ref 0.0–1.2)
CO2: 27 mmol/L (ref 20–29)
Calcium: 9.2 mg/dL (ref 8.6–10.2)
Chloride: 103 mmol/L (ref 96–106)
Creatinine, Ser: 1.69 mg/dL — ABNORMAL HIGH (ref 0.76–1.27)
GFR calc Af Amer: 41 mL/min/{1.73_m2} — ABNORMAL LOW (ref >59)
GFR calc non Af Amer: 36 mL/min/{1.73_m2} — ABNORMAL LOW (ref >59)
Globulin, Total: 3.4 g/dL (ref 1.5–4.5)
Glucose: 132 mg/dL — ABNORMAL HIGH (ref 65–99)
Potassium: 5 mmol/L (ref 3.5–5.2)
Sodium: 143 mmol/L (ref 134–144)
Total Protein: 6.5 g/dL (ref 6.0–8.5)

## 2017-08-07 LAB — CBC WITH DIFFERENTIAL/PLATELET
Basophils Absolute: 0 10*3/uL (ref 0.0–0.2)
Basophils Absolute: 0 10*3/uL (ref 0.0–0.1)
Basophils Relative: 1 %
Basos: 0 %
EOS (ABSOLUTE): 0.2 10*3/uL (ref 0.0–0.4)
Eos: 3 %
Eosinophils Absolute: 0.3 10*3/uL (ref 0.0–0.7)
Eosinophils Relative: 5 %
HCT: 27.9 % — ABNORMAL LOW (ref 39.0–52.0)
Hematocrit: 26 % — ABNORMAL LOW (ref 37.5–51.0)
Hemoglobin: 8.1 g/dL — CL (ref 13.0–17.7)
Hemoglobin: 8.3 g/dL — ABNORMAL LOW (ref 13.0–17.0)
Immature Grans (Abs): 0 10*3/uL (ref 0.0–0.1)
Immature Granulocytes: 0 %
Lymphocytes Absolute: 1 10*3/uL (ref 0.7–3.1)
Lymphocytes Relative: 15 %
Lymphs Abs: 1 10*3/uL (ref 0.7–4.0)
Lymphs: 14 %
MCH: 28.4 pg (ref 26.6–33.0)
MCH: 28.9 pg (ref 26.0–34.0)
MCHC: 31.2 g/dL — ABNORMAL LOW (ref 31.5–35.7)
MCHC: 29.7 g/dL — ABNORMAL LOW (ref 30.0–36.0)
MCV: 91 fL (ref 79–97)
MCV: 97.2 fL (ref 78.0–100.0)
Monocytes Absolute: 0.7 10*3/uL (ref 0.1–0.9)
Monocytes Absolute: 0.6 10*3/uL (ref 0.1–1.0)
Monocytes Relative: 9 %
Monocytes: 10 %
Neutro Abs: 4.5 10*3/uL (ref 1.7–7.7)
Neutrophils Absolute: 5.4 10*3/uL (ref 1.4–7.0)
Neutrophils Relative %: 70 %
Neutrophils: 73 %
Platelets: 346 10*3/uL (ref 150–450)
Platelets: 359 10*3/uL (ref 150–400)
RBC: 2.85 x10E6/uL — ABNORMAL LOW (ref 4.14–5.80)
RBC: 2.87 MIL/uL — ABNORMAL LOW (ref 4.22–5.81)
RDW: 14.4 % (ref 12.3–15.4)
RDW: 14.5 % (ref 11.5–15.5)
WBC: 7.3 10*3/uL (ref 3.4–10.8)
WBC: 6.5 10*3/uL (ref 4.0–10.5)

## 2017-08-07 LAB — URINALYSIS, ROUTINE W REFLEX MICROSCOPIC
Bacteria, UA: NONE SEEN
Bilirubin Urine: NEGATIVE
Glucose, UA: NEGATIVE mg/dL
Hgb urine dipstick: NEGATIVE
Ketones, ur: NEGATIVE mg/dL
Nitrite: NEGATIVE
Protein, ur: NEGATIVE mg/dL
Specific Gravity, Urine: 1.02 (ref 1.005–1.030)
pH: 5 (ref 5.0–8.0)

## 2017-08-07 LAB — COMPREHENSIVE METABOLIC PANEL
ALT: 29 U/L (ref 0–44)
AST: 22 U/L (ref 15–41)
Albumin: 2.7 g/dL — ABNORMAL LOW (ref 3.5–5.0)
Alkaline Phosphatase: 70 U/L (ref 38–126)
Anion gap: 6 (ref 5–15)
BUN: 39 mg/dL — ABNORMAL HIGH (ref 8–23)
CO2: 30 mmol/L (ref 22–32)
Calcium: 8.9 mg/dL (ref 8.9–10.3)
Chloride: 106 mmol/L (ref 98–111)
Creatinine, Ser: 1.62 mg/dL — ABNORMAL HIGH (ref 0.61–1.24)
GFR calc Af Amer: 42 mL/min — ABNORMAL LOW (ref >60)
GFR calc non Af Amer: 37 mL/min — ABNORMAL LOW (ref >60)
Glucose, Bld: 114 mg/dL — ABNORMAL HIGH (ref 70–99)
Potassium: 5 mmol/L (ref 3.5–5.1)
Sodium: 142 mmol/L (ref 135–145)
Total Bilirubin: 0.3 mg/dL (ref 0.3–1.2)
Total Protein: 7.3 g/dL (ref 6.5–8.1)

## 2017-08-07 LAB — BLOOD GAS, ARTERIAL
Acid-Base Excess: 2.7 mmol/L — ABNORMAL HIGH (ref 0.0–2.0)
Bicarbonate: 26.1 mmol/L (ref 20.0–28.0)
Drawn by: 277331
O2 Content: 3 L/min
O2 Saturation: 98.4 %
Patient temperature: 37
pCO2 arterial: 69.2 mmHg (ref 32.0–48.0)
pH, Arterial: 7.25 — ABNORMAL LOW (ref 7.350–7.450)
pO2, Arterial: 137 mmHg — ABNORMAL HIGH (ref 83.0–108.0)

## 2017-08-07 LAB — LACTIC ACID, PLASMA
Lactic Acid, Venous: 0.7 mmol/L (ref 0.5–1.9)
Lactic Acid, Venous: 0.5 mmol/L (ref 0.5–1.9)

## 2017-08-07 LAB — TROPONIN I: Troponin I: <0.03 ng/mL (ref <0.03)

## 2017-08-07 LAB — I-STAT CG4 LACTIC ACID, ED: Lactic Acid, Venous: 0.43 mmol/L — ABNORMAL LOW (ref 0.5–1.9)

## 2017-08-07 MED ORDER — SODIUM CHLORIDE 0.9 % IV SOLN
500.0000 mg | INTRAVENOUS | Status: DC
  Administered 2017-08-07 – 2017-08-13 (×7): 500 mg via INTRAVENOUS
  Filled 2017-08-07 (×9): qty 500

## 2017-08-07 MED ORDER — IPRATROPIUM-ALBUTEROL 0.5-2.5 (3) MG/3ML IN SOLN
3.0000 mL | Freq: Four times a day (QID) | RESPIRATORY_TRACT | Status: DC
  Administered 2017-08-07 – 2017-08-09 (×6): 3 mL via RESPIRATORY_TRACT
  Filled 2017-08-07 (×6): qty 3

## 2017-08-07 MED ORDER — ACETAMINOPHEN 325 MG PO TABS: 650.0000 mg | ORAL_TABLET | Freq: Four times a day (QID) | ORAL | Status: DC | PRN

## 2017-08-07 MED ORDER — IPRATROPIUM BROMIDE 0.02 % IN SOLN: 0.5000 mg | Freq: Four times a day (QID) | RESPIRATORY_TRACT | Status: DC

## 2017-08-07 MED ORDER — ACETAMINOPHEN 650 MG RE SUPP: 650.0000 mg | Freq: Four times a day (QID) | RECTAL | Status: DC | PRN

## 2017-08-07 MED ORDER — ONDANSETRON HCL 4 MG/2ML IJ SOLN: 4.0000 mg | Freq: Four times a day (QID) | INTRAMUSCULAR | Status: DC | PRN

## 2017-08-07 MED ORDER — GABAPENTIN 100 MG PO CAPS
100.0000 mg | ORAL_CAPSULE | Freq: Two times a day (BID) | ORAL | Status: DC
  Administered 2017-08-08 – 2017-08-15 (×15): 100 mg via ORAL
  Filled 2017-08-07 (×15): qty 1

## 2017-08-07 MED ORDER — ALBUTEROL SULFATE (2.5 MG/3ML) 0.083% IN NEBU: 2.5000 mg | INHALATION_SOLUTION | Freq: Four times a day (QID) | RESPIRATORY_TRACT | Status: DC

## 2017-08-07 MED ORDER — ALBUTEROL SULFATE (2.5 MG/3ML) 0.083% IN NEBU
2.5000 mg | INHALATION_SOLUTION | RESPIRATORY_TRACT | Status: DC | PRN
  Administered 2017-08-11: 2.5 mg via RESPIRATORY_TRACT
  Filled 2017-08-07: qty 3

## 2017-08-07 MED ORDER — SODIUM CHLORIDE 0.9 % IV BOLUS (SEPSIS)
1000.0000 mL | Freq: Once | INTRAVENOUS | Status: AC
  Administered 2017-08-07: 1000 mL via INTRAVENOUS

## 2017-08-07 MED ORDER — SODIUM CHLORIDE 0.9 % IV SOLN
INTRAVENOUS | Status: DC
  Administered 2017-08-07: 21:00:00 via INTRAVENOUS

## 2017-08-07 MED ORDER — ORAL CARE MOUTH RINSE
15.0000 mL | Freq: Two times a day (BID) | OROMUCOSAL | Status: DC
  Administered 2017-08-08 – 2017-08-15 (×8): 15 mL via OROMUCOSAL

## 2017-08-07 MED ORDER — ONDANSETRON HCL 4 MG PO TABS: 4.0000 mg | ORAL_TABLET | Freq: Four times a day (QID) | ORAL | Status: DC | PRN

## 2017-08-07 MED ORDER — ASPIRIN EC 81 MG PO TBEC
81.0000 mg | DELAYED_RELEASE_TABLET | Freq: Every day | ORAL | Status: DC
  Administered 2017-08-08 – 2017-08-15 (×8): 81 mg via ORAL
  Filled 2017-08-07 (×11): qty 1

## 2017-08-07 MED ORDER — CHLORHEXIDINE GLUCONATE 0.12 % MT SOLN
15.0000 mL | Freq: Two times a day (BID) | OROMUCOSAL | Status: DC
  Administered 2017-08-08 – 2017-08-15 (×15): 15 mL via OROMUCOSAL
  Filled 2017-08-07 (×16): qty 15

## 2017-08-07 MED ORDER — CLONAZEPAM 0.5 MG PO TABS
1.0000 mg | ORAL_TABLET | Freq: Three times a day (TID) | ORAL | Status: DC | PRN
  Administered 2017-08-08 – 2017-08-14 (×10): 1 mg via ORAL
  Filled 2017-08-07 (×11): qty 2

## 2017-08-07 MED ORDER — HEMOCYTE PLUS 106-1 MG PO CAPS: 1.0000 | ORAL_CAPSULE | Freq: Every day | ORAL | 3 refills | Status: DC

## 2017-08-07 MED ORDER — SODIUM CHLORIDE 0.9 % IV SOLN: 1.0000 g | Freq: Once | INTRAVENOUS | Status: DC

## 2017-08-07 MED ORDER — ENOXAPARIN SODIUM 40 MG/0.4ML ~~LOC~~ SOLN
40.0000 mg | SUBCUTANEOUS | Status: DC
  Administered 2017-08-07 – 2017-08-14 (×8): 40 mg via SUBCUTANEOUS
  Filled 2017-08-07 (×7): qty 0.4

## 2017-08-07 MED ORDER — DOXAZOSIN MESYLATE 2 MG PO TABS
2.0000 mg | ORAL_TABLET | Freq: Every day | ORAL | Status: DC
  Administered 2017-08-08 – 2017-08-15 (×8): 2 mg via ORAL
  Filled 2017-08-07 (×8): qty 1

## 2017-08-07 MED ORDER — SODIUM CHLORIDE 0.9 % IV SOLN
2.0000 g | INTRAVENOUS | Status: DC
  Administered 2017-08-07 – 2017-08-13 (×7): 2 g via INTRAVENOUS
  Filled 2017-08-07 (×2): qty 2
  Filled 2017-08-07 (×3): qty 20
  Filled 2017-08-07 (×4): qty 2

## 2017-08-07 NOTE — H&P (Signed)
History and Physical    Steven [name] IRS:[id number] DOB: Sep 10, 1928 DOA: 08/07/2017  PCP: [name], [name]  Patient coming from: home  I have personally briefly reviewed patient's old medical records in [place]  Chief Complaint: shortness of breath  HPI: Steven [name] is a 84 y.o. male with medical history significant of COPD, hypertension, chronic kidney disease stage III.  History is somewhat limited since patient is currently on BiPAP.  No family present.  Information is obtained from the medical record.  Patient has been feeling short of breath and coughing for the past 3 days.  He is seen his primary care physician yesterday and was diagnosed with pneumonia.  He was started on azithromycin.  Overnight, his shortness of breath has substantially gotten worse.  EMS was called and is brought to the hospital for evaluation.  He denies any chest pain.  No vomiting or diarrhea.  ED Course: In the emergency room, he was noted to be hypoxic on room air.  Initially oxygen was applied with improvement of oxygen saturations.  Blood gas indicated respiratory acidosis with elevated PCO2.  He was started on BiPAP.  He was initially noted to be is sleepy, but after initiation of BiPAP, mental status is improving.  Chest x-ray indicated underlying pneumonia.  Review of Systems: As per HPI otherwise 10 point review of systems negative.    Past Medical History:  Diagnosis Date  . Anxiety   . Cancer [facility]) 2013   Lung - cancer free x3 years.   . Hyperlipidemia   . Hypertension   . Myocardial infarction ([place]) 1999    Past Surgical History:  Procedure Laterality Date  . CARPAL TUNNEL RELEASE  1980's   right and left  . CHOLECYSTECTOMY    . COLONOSCOPY N/A 09/29/2014   Procedure: COLONOSCOPY;  Surgeon: Rogene Houston, MD;  [facility];  Service: Endoscopy;  Laterality: N/A;  . CORONARY ARTERY BYPASS GRAFT  1999   5 grafts  . FLEXIBLE BRONCHOSCOPY  11/07/2011   Procedure: FLEXIBLE BRONCHOSCOPY;  Surgeon: Gaye Pollack, MD;  Location: [place];  Service: Thoracic;  Laterality: N/A;  . HERNIA REPAIR  0093   umbilical hernia repair  . JOINT REPLACEMENT  2000   right shoulder rotator cuff repair     reports that he quit smoking about 10 years ago. His smoking use included cigarettes. He started smoking about 11 years ago. He has a 146.00 pack-year smoking history. He has never used smokeless tobacco. He reports that he does not drink alcohol or use drugs.  No Known Allergies  Family History  Problem Relation Age of Onset  . Cancer Mother   . Pulmonary embolism Father     Prior to Admission medications   Medication Sig Start Date End Date Taking? Authorizing Provider  albuterol (PROVENTIL HFA;VENTOLIN HFA) 108 (90 Base) MCG/ACT inhaler Inhale 1-2 puffs into the lungs every 4 (four) hours as needed for wheezing or shortness of breath. 03/18/16  Yes Eber Jones, MD  albuterol (PROVENTIL) (2.5 MG/3ML) 0.083% nebulizer solution Take 3 mLs (2.5 mg total) by nebulization every 2 (two) hours as needed for wheezing or shortness of breath. 03/18/16  Yes Eber Jones, MD  aspirin 81 MG tablet Take 81 mg by mouth daily.  07/19/15  Yes [provider]  calcium carbonate (TUMS - DOSED IN MG ELEMENTAL CALCIUM) 500 MG chewable tablet Chew 1 tablet by mouth daily.   Yes [provider]  clonazePAM (  KLONOPIN) 1 MG tablet Take 1 tablet (1 mg total) by mouth 3 (three) times daily as needed. 08/01/17  Yes Martin, Mary-Margaret, [name]  doxazosin (CARDURA) 2 MG tablet Take 1 tablet (2 mg total) by mouth daily. 08/01/17  Yes Martin, Mary-Margaret, [name]  gabapentin (NEURONTIN) 100 MG capsule Take 1 capsule (100 mg total) by mouth 2 (two) times daily. 08/06/17  Yes Martin, Mary-Margaret, [name]  lisinopril (PRINIVIL,ZESTRIL) 20 MG tablet Take 1 tablet (20 mg total) by mouth daily. 08/01/17  Yes Martin, Mary-Margaret, [name]  Multiple Vitamins-Iron  (MULTIVITAMINS WITH IRON) TABS tablet Take 1 tablet by mouth daily.   Yes [provider]  atorvastatin (LIPITOR) 10 MG tablet Take 1 tablet (10 mg total) by mouth daily. Patient not taking: Reported on 08/07/2017 08/01/17   [name], [name]    Physical Exam: Vitals:   08/07/17 1600 08/07/17 1700 08/07/17 1715 08/07/17 1730  BP: (!) 114/48 (!) 111/52  112/78  Pulse:   85 83  Resp: (!) 22   19  Temp:      TempSrc:      SpO2:   100% 100%  Weight:        Constitutional: NAD, calm, comfortable Vitals:   08/07/17 1600 08/07/17 1700 08/07/17 1715 08/07/17 1730  BP: (!) 114/48 (!) 111/52  112/78  Pulse:   85 83  Resp: (!) 22   19  Temp:      TempSrc:      SpO2:   100% 100%  Weight:       Eyes: PERRL, lids and conjunctivae normal ENMT: Cannot assess due to BiPAP mask Neck: normal, supple, no masses, no thyromegaly Respiratory: Right-sided rhonchi. Normal respiratory effort. No accessory muscle use.  Cardiovascular: Regular rate and rhythm, no murmurs / rubs / gallops. No extremity edema. 2+ pedal pulses. No carotid bruits.  Abdomen: no tenderness, no masses palpated. No hepatosplenomegaly. Bowel sounds positive.  Musculoskeletal: no clubbing / cyanosis. No joint deformity upper and lower extremities. Good ROM, no contractures. Normal muscle tone.  Skin: no rashes, lesions, ulcers. No induration Neurologic: limited exam due to respiratory status. No gross deficits Psychiatric: limited conversation due to respiratory status   Labs on Admission: I have personally reviewed following labs and imaging studies  CBC: Recent Labs  Lab 08/06/17 1210 08/07/17 1316  WBC 7.3 6.5  NEUTROABS 5.4 4.5  HGB 8.1* 8.3*  HCT 26.0* 27.9*  MCV 91 97.2  PLT 346 267   Basic Metabolic Panel: Recent Labs  Lab 08/06/17 1210 08/07/17 1316  NA 143 142  K 5.0 5.0  CL 103 106  CO2 27 30  GLUCOSE 132* 114*  BUN 34* 39*  CREATININE 1.69* 1.62*  CALCIUM 9.2 8.9    GFR: Estimated Creatinine Clearance: 27.4 mL/min (A) (by C-G formula based on SCr of 1.62 mg/dL (H)). Liver Function Tests: Recent Labs  Lab 08/06/17 1210 08/07/17 1316  AST 24 22  ALT 27 29  ALKPHOS 73 70  BILITOT <0.2 0.3  PROT 6.5 7.3  ALBUMIN 3.1* 2.7*   No results for input(s): LIPASE, AMYLASE in the last 168 hours. No results for input(s): AMMONIA in the last 168 hours. Coagulation Profile: No results for input(s): INR, PROTIME in the last 168 hours. Cardiac Enzymes: Recent Labs  Lab 08/07/17 1316  TROPONINI <0.03   BNP (last 3 results) No results for input(s): PROBNP in the last 8760 hours. HbA1C: No results for input(s): HGBA1C in the last 72 hours. CBG: No results for input(s): GLUCAP  in the last 168 hours. Lipid Profile: No results for input(s): CHOL, HDL, LDLCALC, TRIG, CHOLHDL, LDLDIRECT in the last 72 hours. Thyroid Function Tests: No results for input(s): TSH, T4TOTAL, FREET4, T3FREE, THYROIDAB in the last 72 hours. Anemia Panel: No results for input(s): VITAMINB12, FOLATE, FERRITIN, TIBC, IRON, RETICCTPCT in the last 72 hours. Urine analysis:    Component Value Date/Time   COLORURINE YELLOW 08/07/2017 1252   APPEARANCEUR HAZY (A) 08/07/2017 1252   LABSPEC 1.020 08/07/2017 1252   PHURINE 5.0 08/07/2017 1252   GLUCOSEU NEGATIVE 08/07/2017 1252   HGBUR NEGATIVE 08/07/2017 1252   [place] 08/07/2017 1252   [place] 08/07/2017 1252   PROTEINUR NEGATIVE 08/07/2017 1252   UROBILINOGEN 0.2 11/05/2011 1456   NITRITE NEGATIVE 08/07/2017 1252   LEUKOCYTESUR TRACE (A) 08/07/2017 1252    Radiological Exams on Admission: Dg Chest 1 View  Result Date: 08/07/2017 CLINICAL DATA:  Shortness of breath EXAM: CHEST  1 VIEW COMPARISON:  August 06, 2017 FINDINGS: There is somewhat less patchy airspace opacity in the right base compared to 1 day prior. There is atelectatic change in each lung base currently. Heart is upper normal in size with  pulmonary vascularity normal. Patient is status post coronary artery bypass grafting. No adenopathy. No evident lesions. IMPRESSION: Partial clearing right base. Mild bibasilar atelectasis. Stable cardiac silhouette. Electronically Signed   By: Lowella Grip III M.D.   On: 08/07/2017 13:40   Dg Chest 2 View  Result Date: 08/06/2017 CLINICAL DATA:  Cough and congestion EXAM: CHEST - 2 VIEW COMPARISON:  Jun 13, 2017 FINDINGS: There is patchy airspace consolidation in the right base. Lungs are otherwise clear although hyperexpanded. Heart size and pulmonary vascularity are normal. Patient is status post coronary artery bypass grafting. There is aortic atherosclerosis. No adenopathy. No evident bone lesions. IMPRESSION: Airspace consolidation consistent with pneumonia right base. Lungs elsewhere clear although hyperexpanded. Stable cardiac silhouette. Status post coronary artery bypass grafting. There is aortic atherosclerosis. Aortic Atherosclerosis (ICD10-I70.0). Electronically Signed   By: Lowella Grip III M.D.   On: 08/06/2017 15:41    EKG: Independently reviewed. Sinus rhythm without acute changes  Assessment/Plan Active Problems:   Anxiety state   Essential hypertension   COPD with emphysema (HCC)   BPH (benign prostatic hyperplasia)   CKD (chronic kidney disease), stage III (HCC)   Acute respiratory failure with hypoxia and hypercapnia (HCC)   PNA (pneumonia)   84 year old male admitted to the hospital with acute respiratory failure, respiratory acidosis and underlying pneumonia with COPD.  He is critically ill and currently on BiPAP.  He will need frequent monitoring in stepdown unit.  1. Acute respiratory failure with hypoxia and hypercapnia.  Currently on BiPAP.  Likely precipitated by pneumonia in the setting of COPD.  Continue BiPAP for now.  Once mental status has significantly improved, can give a trial off of BiPAP. 2. Healthcare associated pneumonia.  Currently on vancomycin  and cefepime.  Continue current treatments. 3. COPD.  No evidence of wheezing at this time.  Will continue on bronchodilators. 4. Chronic kidney disease stage III.  Creatinine is currently at baseline.  Continue to monitor. 5. BPH.  Stable.  Continue outpatient regimen of Cardura.  6. Hypertension.  Will hold lisinopril for now due to renal dysfunction.  DVT prophylaxis: lovenox  Code Status: full code  Family Communication: no family present   Disposition Plan: pending hospital course  Consults called:   Admission status: inpatient, stepdown   Critical care time: 50 minutes  Kathie Dike MD [facility] Pager [phone]  If 7PM-7AM, please contact night-coverage [email or web address] Password [facility]  08/07/2017, 6:29 PM

## 2017-08-07 NOTE — ED Notes (Signed)
Pt's daughter, Cristina Gong   [phone]

## 2017-08-07 NOTE — ED Notes (Signed)
Respiratory called for transport.

## 2017-08-07 NOTE — ED Triage Notes (Signed)
Pt diagnosed with pneumonia yesterday. Pt called EMS for increased generalized weakness and SOB. Per EMS, oxygen saturation 82% on RA (pt does not wear oxygen). Sats increased to 94% on 2L. Productive cough noted.

## 2017-08-07 NOTE — ED Notes (Signed)
RT called for bipap

## 2017-08-07 NOTE — ED Provider Notes (Signed)
[facility] Provider Note   CSN: [id number] Arrival date & time: 08/07/17  1229     History   Chief Complaint Chief Complaint  Patient presents with  . Weakness    HPI Steven [name] is a 84 y.o. male.  Pt presents to the ED today with sob.  The pt saw his pcp yesterday and was diagnosed with pna.  He was given rx for zithromax.  Pt was called to pt's residence today because of increased sob.  The pt's O2 sat on RA was 82% on RA (he does not usually wear oxygen).  He was placed on 2L oxygen via [place] and Ow sats are up to 94%.  Pt has had a cough for the past few days.  He is a poor historian.     Past Medical History:  Diagnosis Date  . Anxiety   . Cancer [facility]) 2013   Lung - cancer free x3 years.   . Hyperlipidemia   . Hypertension   . Myocardial infarction [facility]) 1999    Patient Active Problem List   Diagnosis Date Noted  . Acute respiratory failure with hypoxia and hypercapnia ([place]) 08/07/2017  . Abdominal bruit 10/15/2014  . CKD (chronic kidney disease), stage III ([place]) 09/28/2014  . BPH (benign prostatic hyperplasia) 02/25/2013  . Squamous cell carcinoma of lung ([place]) 11/13/2011  . COPD with emphysema ([place]) 07/29/2007  . Hyperlipidemia with target LDL less than 100 07/28/2007  . Anxiety state 07/28/2007  . ERECTILE DYSFUNCTION 07/28/2007  . Essential hypertension 07/28/2007  . Coronary atherosclerosis 07/28/2007    Past Surgical History:  Procedure Laterality Date  . CARPAL TUNNEL RELEASE  1980's   right and left  . CHOLECYSTECTOMY    . COLONOSCOPY N/A 09/29/2014   Procedure: COLONOSCOPY;  Surgeon: Rogene Houston, MD;  [facility];  Service: Endoscopy;  Laterality: N/A;  . CORONARY ARTERY BYPASS GRAFT  1999   5 grafts  . FLEXIBLE BRONCHOSCOPY  11/07/2011   Procedure: FLEXIBLE BRONCHOSCOPY;  Surgeon: Gaye Pollack, MD;  Location: [place];  Service: Thoracic;  Laterality: N/A;  . HERNIA REPAIR  6010   umbilical hernia repair  .  JOINT REPLACEMENT  2000   right shoulder rotator cuff repair        Home Medications    Prior to Admission medications   Medication Sig Start Date End Date Taking? Authorizing Provider  albuterol (PROVENTIL HFA;VENTOLIN HFA) 108 (90 Base) MCG/ACT inhaler Inhale 1-2 puffs into the lungs every 4 (four) hours as needed for wheezing or shortness of breath. 03/18/16   Eber Jones, MD  albuterol (PROVENTIL) (2.5 MG/3ML) 0.083% nebulizer solution Take 3 mLs (2.5 mg total) by nebulization every 2 (two) hours as needed for wheezing or shortness of breath. 03/18/16   Eber Jones, MD  aspirin 81 MG tablet Take 81 mg by mouth daily.  07/19/15   [provider]  atorvastatin (LIPITOR) 10 MG tablet Take 1 tablet (10 mg total) by mouth daily. 08/01/17   [name], [name]  azithromycin (ZITHROMAX Z-PAK) 250 MG tablet As directed 08/06/17   [name], [name]  calcium carbonate (TUMS - DOSED IN MG ELEMENTAL CALCIUM) 500 MG chewable tablet Chew 1 tablet by mouth daily.    [provider]  clonazePAM (KLONOPIN) 1 MG tablet Take 1 tablet (1 mg total) by mouth 3 (three) times daily as needed. 08/01/17   Hassell Done Mary-Margaret, [name]  doxazosin (CARDURA) 2 MG tablet Take 1 tablet (2  mg total) by mouth daily. 08/01/17   Hassell Done, Mary-Margaret, [name]  Fe Fum-FA-B Cmp-C-Zn-Mg-Mn-Cu (HEMOCYTE PLUS) 106-1 MG CAPS Take 1 tablet by mouth daily. 08/07/17   Hassell Done, Mary-Margaret, [name]  gabapentin (NEURONTIN) 100 MG capsule Take 1 capsule (100 mg total) by mouth 2 (two) times daily. 08/06/17   Hassell Done, Mary-Margaret, [name]  lisinopril (PRINIVIL,ZESTRIL) 20 MG tablet Take 1 tablet (20 mg total) by mouth daily. 08/01/17   Hassell Done Mary-Margaret, [name]  Multiple Vitamins-Iron (MULTIVITAMINS WITH IRON) TABS tablet Take 1 tablet by mouth daily.    [provider]    Family History Family History  Problem Relation Age of Onset  . Cancer Mother   . Pulmonary embolism Father     Social  History Social History   Tobacco Use  . Smoking status: Former Smoker    Packs/day: 2.00    Years: 73.00    Pack years: 146.00    Types: Cigarettes    Start date: 02/05/2006    Last attempt to quit: 11/05/2006    Years since quitting: 10.7  . Smokeless tobacco: Never Used  Substance Use Topics  . Alcohol use: No  . Drug use: No     Allergies   Patient has no known allergies.   Review of Systems Review of Systems  Respiratory: Positive for cough and shortness of breath.   All other systems reviewed and are negative.    Physical Exam Updated Vital Signs BP (!) 104/49   Pulse 91   Temp (!) 97.5 F (36.4 C) (Rectal)   Resp 12   Wt 60.3 kg (133 lb)   SpO2 95%   BMI 20.83 kg/m   Physical Exam  Constitutional: He is oriented to person, place, and time. He appears well-developed and well-nourished. He appears distressed.  HENT:  Head: Normocephalic and atraumatic.  Right Ear: External ear normal.  Left Ear: External ear normal.  Nose: Nose normal.  Mouth/Throat: Mucous membranes are dry.  Eyes: Pupils are equal, round, and reactive to light. Conjunctivae and EOM are normal.  Neck: Normal range of motion. Neck supple.  Cardiovascular: Normal rate, regular rhythm, normal heart sounds and intact distal pulses.  Pulmonary/Chest: Accessory muscle usage present. Tachypnea noted. He has rhonchi.  Abdominal: Soft. Bowel sounds are normal.  Musculoskeletal: Normal range of motion.  Neurological: He is alert and oriented to person, place, and time.  Skin: Skin is warm. Capillary refill takes less than 2 seconds.  Psychiatric: He has a normal mood and affect.  Nursing note and vitals reviewed.    ED Treatments / Results  Labs (all labs ordered are listed, but only abnormal results are displayed) Labs Reviewed  COMPREHENSIVE METABOLIC PANEL - Abnormal; Notable for the following components:      Result Value   Glucose, Bld 114 (*)    BUN 39 (*)    Creatinine, Ser 1.62  (*)    Albumin 2.7 (*)    GFR calc non Af Amer 37 (*)    GFR calc Af Amer 42 (*)    All other components within normal limits  CBC WITH DIFFERENTIAL/PLATELET - Abnormal; Notable for the following components:   RBC 2.87 (*)    Hemoglobin 8.3 (*)    HCT 27.9 (*)    MCHC 29.7 (*)    All other components within normal limits  BLOOD GAS, ARTERIAL - Abnormal; Notable for the following components:   pH, Arterial 7.250 (*)    pCO2 arterial 69.2 (*)    pO2, Arterial 137 (*)  Acid-Base Excess 2.7 (*)    All other components within normal limits  I-STAT CG4 LACTIC ACID, ED - Abnormal; Notable for the following components:   Lactic Acid, Venous 0.43 (*)    All other components within normal limits  CULTURE, BLOOD (ROUTINE X 2)  CULTURE, BLOOD (ROUTINE X 2)  TROPONIN I  URINALYSIS, ROUTINE W REFLEX MICROSCOPIC  LACTIC ACID, PLASMA  LACTIC ACID, PLASMA    EKG EKG Interpretation  Date/Time:  Wednesday August 07 2017 12:41:58 EDT Ventricular Rate:  79 PR Interval:    QRS Duration: 95 QT Interval:  391 QTC Calculation: 449 R Axis:   76 Text Interpretation:  Sinus arrhythmia Confirmed by Isla Pence [phone]) on 08/07/2017 12:43:33 PM   Radiology Dg Chest 1 View  Result Date: 08/07/2017 CLINICAL DATA:  Shortness of breath EXAM: CHEST  1 VIEW COMPARISON:  August 06, 2017 FINDINGS: There is somewhat less patchy airspace opacity in the right base compared to 1 day prior. There is atelectatic change in each lung base currently. Heart is upper normal in size with pulmonary vascularity normal. Patient is status post coronary artery bypass grafting. No adenopathy. No evident lesions. IMPRESSION: Partial clearing right base. Mild bibasilar atelectasis. Stable cardiac silhouette. Electronically Signed   By: Lowella Grip III M.D.   On: 08/07/2017 13:40   Dg Chest 2 View  Result Date: 08/06/2017 CLINICAL DATA:  Cough and congestion EXAM: CHEST - 2 VIEW COMPARISON:  Jun 13, 2017 FINDINGS: There is  patchy airspace consolidation in the right base. Lungs are otherwise clear although hyperexpanded. Heart size and pulmonary vascularity are normal. Patient is status post coronary artery bypass grafting. There is aortic atherosclerosis. No adenopathy. No evident bone lesions. IMPRESSION: Airspace consolidation consistent with pneumonia right base. Lungs elsewhere clear although hyperexpanded. Stable cardiac silhouette. Status post coronary artery bypass grafting. There is aortic atherosclerosis. Aortic Atherosclerosis (ICD10-I70.0). Electronically Signed   By: Lowella Grip III M.D.   On: 08/06/2017 15:41    Procedures Procedures (including critical care time)  Medications Ordered in ED Medications  cefTRIAXone (ROCEPHIN) 2 g in sodium chloride 0.9 % 100 mL IVPB (0 g Intravenous Stopped 08/07/17 1401)  azithromycin (ZITHROMAX) 500 mg in sodium chloride 0.9 % 250 mL IVPB (0 mg Intravenous Stopped 08/07/17 1509)  sodium chloride 0.9 % bolus 1,000 mL (0 mLs Intravenous Stopped 08/07/17 1401)    And  sodium chloride 0.9 % bolus 1,000 mL (0 mLs Intravenous Stopped 08/07/17 1509)     Initial Impression / Assessment and Plan / ED Course  I have reviewed the triage vital signs and the nursing notes.  Pertinent labs & imaging results that were available during my care of the patient were reviewed by me and considered in my medical decision making (see chart for details).  CRITICAL CARE Performed by: Isla Pence   Total critical care time: 45 minutes  Critical care time was exclusive of separately billable procedures and treating other patients.  Critical care was necessary to treat or prevent imminent or life-threatening deterioration.  Critical care was time spent personally by me on the following activities: development of treatment plan with patient and/or surrogate as well as nursing, discussions with consultants, evaluation of patient's response to treatment, examination of patient,  obtaining history from patient or surrogate, ordering and performing treatments and interventions, ordering and review of laboratory studies, ordering and review of radiographic studies, pulse oximetry and re-evaluation of patient's condition.    Pt's ABG showing CO2 retention.  He does  have a hx of COPD.  The pt placed on bipap and he is a little more alert.  The pt was given IV rocephin and zithromax for pna.  Hgb is low, but that looks chronic.  CKD is chronic.  He was d/w Dr. Roderic Palau (triad) for admission.   Final Clinical Impressions(s) / ED Diagnoses   Final diagnoses:  Acute respiratory failure with hypoxia and hypercapnia ([place])  Community acquired pneumonia of right lower lobe of lung (HCC)  CKD (chronic kidney disease) stage 3, GFR 30-59 ml/min (HCC)  Chronic anemia    ED Discharge Orders    None       Isla Pence, MD 08/07/17 1513

## 2017-08-08 DIAGNOSIS — J9601 Acute respiratory failure with hypoxia: Secondary | ICD-10-CM

## 2017-08-08 DIAGNOSIS — Z7189 Other specified counseling: Secondary | ICD-10-CM

## 2017-08-08 DIAGNOSIS — J181 Lobar pneumonia, unspecified organism: Secondary | ICD-10-CM

## 2017-08-08 DIAGNOSIS — J9602 Acute respiratory failure with hypercapnia: Secondary | ICD-10-CM

## 2017-08-08 DIAGNOSIS — J189 Pneumonia, unspecified organism: Secondary | ICD-10-CM | POA: Diagnosis present

## 2017-08-08 DIAGNOSIS — Z515 Encounter for palliative care: Secondary | ICD-10-CM

## 2017-08-08 LAB — BASIC METABOLIC PANEL
Anion gap: 6 (ref 5–15)
BUN: 32 mg/dL — ABNORMAL HIGH (ref 8–23)
CO2: 28 mmol/L (ref 22–32)
Calcium: 8.6 mg/dL — ABNORMAL LOW (ref 8.9–10.3)
Chloride: 113 mmol/L — ABNORMAL HIGH (ref 98–111)
Creatinine, Ser: 1.46 mg/dL — ABNORMAL HIGH (ref 0.61–1.24)
GFR calc Af Amer: 48 mL/min — ABNORMAL LOW (ref >60)
GFR calc non Af Amer: 41 mL/min — ABNORMAL LOW (ref >60)
Glucose, Bld: 94 mg/dL (ref 70–99)
Potassium: 5.3 mmol/L — ABNORMAL HIGH (ref 3.5–5.1)
Sodium: 147 mmol/L — ABNORMAL HIGH (ref 135–145)

## 2017-08-08 LAB — CBC
HCT: 28.5 % — ABNORMAL LOW (ref 39.0–52.0)
Hemoglobin: 8.1 g/dL — ABNORMAL LOW (ref 13.0–17.0)
MCH: 28.1 pg (ref 26.0–34.0)
MCHC: 28.4 g/dL — ABNORMAL LOW (ref 30.0–36.0)
MCV: 99 fL (ref 78.0–100.0)
Platelets: 311 10*3/uL (ref 150–400)
RBC: 2.88 MIL/uL — ABNORMAL LOW (ref 4.22–5.81)
RDW: 15 % (ref 11.5–15.5)
WBC: 6.1 10*3/uL (ref 4.0–10.5)

## 2017-08-08 LAB — MRSA PCR SCREENING: MRSA by PCR: NEGATIVE

## 2017-08-08 MED ORDER — LORAZEPAM 2 MG/ML IJ SOLN
0.5000 mg | Freq: Once | INTRAMUSCULAR | Status: AC
  Administered 2017-08-09: 0.5 mg via INTRAVENOUS
  Filled 2017-08-08: qty 1

## 2017-08-08 MED ORDER — SODIUM CHLORIDE 0.45 % IV SOLN
INTRAVENOUS | Status: DC
  Administered 2017-08-08 – 2017-08-09 (×2): via INTRAVENOUS

## 2017-08-08 MED ORDER — SODIUM POLYSTYRENE SULFONATE 15 GM/60ML PO SUSP
30.0000 g | Freq: Once | ORAL | Status: AC
  Administered 2017-08-08: 30 g via ORAL
  Filled 2017-08-08: qty 120

## 2017-08-08 NOTE — Progress Notes (Signed)
CRITICAL VALUE ALERT  Critical Value:  ABG: pH-7.218; CO2-68.7; PO2-146  Date & Time Notied:  08/08/2017 0540  MD Order: Increase pt rate to 16

## 2017-08-08 NOTE — Progress Notes (Signed)
Patient is alert and responds to voice. Speech is slurred/incomprehensible at times. VS stable. Pt is incontinent of bowels and urine. Family not present. Unable to ask pt admission questions or assess pt's baseline mental status.   Pt bladder scanned. Revealed 520cc urine in bladder. Received order for foley. Pt then urinated shortly after foley order placed.Condom Cath in place.  Will continue to monitor patient. Foley not inserted at this time.   Margaret Pyle, RN

## 2017-08-08 NOTE — Consult Note (Signed)
Consultation Note Date: 08/08/2017   Patient Name: Steven [name]  DOB: [birth year]  MRN: [id number]  Age / Sex: 84 y.o., male  PCP: Chevis [name], [place] Referring Physician: Murlean Iba, MD  Reason for Consultation: Establishing goals of care  HPI/Patient Profile: 84 y.o. male  with past medical history of high blood pressure and cholesterol, history of heart attack in 1999, lung cancer in 2013, 146-pack-year smoking history quit approximately 10 years ago admitted on 08/07/2017 with acute respiratory failure with hypoxia and hypercapnia, healthcare associated pneumonia.   Clinical Assessment and Goals of Care: Steven [name] is resting quietly in bed.  He makes and keeps eye contact, greeting me when I enter the room.  He is calm and cooperative.  There is no family at bedside at this time.  Alert and oriented to person and place, he thinks it is June.  He also continues to ask the same questions over and over in relation to his treatment plan, stating that he did not know he was sick until this morning. As Steven [name] is improving, no call to family at this time.  Will reach out to family for further goals of care discussion tomorrow.  Conference with nursing staff related to plan of care. Conference with hospitalist related to plan of care.  Healthcare power of attorney NEXT OF KIN -Steven [name] names his adult children, Steven [name] and son Steven [name] as surrogate decision makers.   SUMMARY OF RECOMMENDATIONS   At this point, full scope treatment. Continue CODE STATUS discussions.  Code Status/Advance Care Planning: Full code -I asked Steven [name] how he tolerated the BiPAP, he does not seem to remember.  I ask briefly if his breathing were to worsen would he want "life support".  He states that he would, "I don't want to die".  Full discussion needs to be held with surrogate decision  makers.  Symptom Management:   Per list, no additional needs at this time.  Palliative Prophylaxis:   Aspiration and Turn Reposition  Additional Recommendations (Limitations, Scope, Preferences):  Full Scope Treatment  Psycho-social/Spiritual:   Desire for further Chaplaincy support:no  Additional Recommendations: Caregiving  Support/Resources and Education on Hospice  Prognosis:   Unable to determine, based on outcomes.  6 to 12 months or more would not be surprising.  Discharge Planning: To be determined, based on functional status as his condition improves.      Primary Diagnoses: Present on Admission: . Acute respiratory failure with hypoxia and hypercapnia (HCC) . COPD with emphysema ([place]) . Essential hypertension . Anxiety state . BPH (benign prostatic hyperplasia) . CKD (chronic kidney disease), stage III ([place]) . PNA (pneumonia)   I have reviewed the medical record, interviewed the patient and family, and examined the patient. The following aspects are pertinent.  Past Medical History:  Diagnosis Date  . Anxiety   . Cancer [facility]) 2013   Lung - cancer free x3 years.   . Hyperlipidemia   . Hypertension   . Myocardial infarction [facility]) 1999  Social History   Socioeconomic History  . Marital status: Divorced    Spouse name: Not on file  . Number of children: Not on file  . Years of education: Not on file  . Highest education level: Not on file  Occupational History  . Not on file  Social Needs  . Financial resource strain: Not on file  . Food insecurity:    Worry: Not on file    Inability: Not on file  . Transportation needs:    Medical: Not on file    Non-medical: Not on file  Tobacco Use  . Smoking status: Former Smoker    Packs/day: 2.00    Years: 73.00    Pack years: 146.00    Types: Cigarettes    Start date: 02/05/2006    Last attempt to quit: 11/05/2006    Years since quitting: 10.7  . Smokeless tobacco: Never Used  Substance and  Sexual Activity  . Alcohol use: No  . Drug use: No  . Sexual activity: Not on file  Lifestyle  . Physical activity:    Days per week: Not on file    Minutes per session: Not on file  . Stress: Not on file  Relationships  . Social connections:    Talks on phone: Not on file    Gets together: Not on file    Attends religious service: Not on file    Active member of club or organization: Not on file    Attends meetings of clubs or organizations: Not on file    Relationship status: Not on file  Other Topics Concern  . Not on file  Social History Narrative  . Not on file   Family History  Problem Relation Age of Onset  . Cancer Mother   . Pulmonary embolism Father    Scheduled Meds: . aspirin EC  81 mg Oral Daily  . chlorhexidine  15 mL Mouth Rinse BID  . doxazosin  2 mg Oral Daily  . enoxaparin (LOVENOX) injection  40 mg Subcutaneous Q24H  . gabapentin  100 mg Oral BID  . ipratropium-albuterol  3 mL Nebulization Q6H  . mouth rinse  15 mL Mouth Rinse q12n4p   Continuous Infusions: . sodium chloride 65 mL/hr at 08/08/17 0758  . azithromycin 500 mg (08/08/17 1356)  . cefTRIAXone (ROCEPHIN)  IV Stopped (08/08/17 1345)   PRN Meds:.acetaminophen **OR** acetaminophen, albuterol, clonazePAM, ondansetron **OR** ondansetron (ZOFRAN) IV Medications Prior to Admission:  Prior to Admission medications   Medication Sig Start Date End Date Taking? Authorizing Provider  albuterol (PROVENTIL HFA;VENTOLIN HFA) 108 (90 Base) MCG/ACT inhaler Inhale 1-2 puffs into the lungs every 4 (four) hours as needed for wheezing or shortness of breath. 03/18/16  Yes Eber Jones, MD  albuterol (PROVENTIL) (2.5 MG/3ML) 0.083% nebulizer solution Take 3 mLs (2.5 mg total) by nebulization every 2 (two) hours as needed for wheezing or shortness of breath. 03/18/16  Yes Eber Jones, MD  aspirin 81 MG tablet Take 81 mg by mouth daily.  07/19/15  Yes [provider]  calcium carbonate  (TUMS - DOSED IN MG ELEMENTAL CALCIUM) 500 MG chewable tablet Chew 1 tablet by mouth daily.   Yes [provider]  clonazePAM (KLONOPIN) 1 MG tablet Take 1 tablet (1 mg total) by mouth 3 (three) times daily as needed. 08/01/17  Yes Martin, Mary-Margaret, [name]  doxazosin (CARDURA) 2 MG tablet Take 1 tablet (2 mg total) by mouth daily. 08/01/17  Yes [name], [name]  gabapentin (NEURONTIN) 100 MG capsule Take 1 capsule (100 mg total) by mouth 2 (two) times daily. 08/06/17  Yes Martin, Mary-Margaret, [name]  lisinopril (PRINIVIL,ZESTRIL) 20 MG tablet Take 1 tablet (20 mg total) by mouth daily. 08/01/17  Yes Martin, Mary-Margaret, [name]  Multiple Vitamins-Iron (MULTIVITAMINS WITH IRON) TABS tablet Take 1 tablet by mouth daily.   Yes [provider]  atorvastatin (LIPITOR) 10 MG tablet Take 1 tablet (10 mg total) by mouth daily. Patient not taking: Reported on 08/07/2017 08/01/17   [name], [name]   No Known Allergies Review of Systems  Unable to perform ROS: Age    Physical Exam  Constitutional: No distress.  Appears frail, chronically ill.  Makes and keeps eye contact  HENT:  Head: Atraumatic.  Cardiovascular: Normal rate.  Pulmonary/Chest: Effort normal. No respiratory distress.  Abdominal: Soft. He exhibits no distension.  Musculoskeletal: He exhibits no edema.  Neurological: He is alert.  Oriented to person and place, thinks its June.  Asks the same question several times  Skin: Skin is warm and dry.  Nursing note and vitals reviewed.   Vital Signs: BP (!) 118/44   Pulse 80   Temp 98.6 F (37 C) (Oral)   Resp 19   Ht 5\' 7"  (1.702 m)   Wt 61.3 kg (135 lb 2.3 oz)   SpO2 98%   BMI 21.17 kg/m  Pain Scale: 0-10   Pain Score: 0-No pain   SpO2: SpO2: 98 % O2 Device:SpO2: 98 % O2 Flow Rate: .O2 Flow Rate (L/min): 3 L/min  IO: Intake/output summary:   Intake/Output Summary (Last 24 hours) at 08/08/2017 1359 Last data filed at 08/08/2017 1311 Gross per  24 hour  Intake 3125 ml  Output 400 ml  Net 2725 ml    LBM: Last BM Date: 08/07/17 Baseline Weight: Weight: 60.3 kg (133 lb) Most recent weight: Weight: 61.3 kg (135 lb 2.3 oz)     Palliative Assessment/Data:   Flowsheet Rows     Most Recent Value  Intake Tab  Referral Department  Hospitalist  Unit at Time of Referral  Intermediate Care Unit  Palliative Care Primary Diagnosis  Pulmonary  Date Notified  08/08/17  Palliative Care Type  New Palliative care  Reason for referral  Clarify Goals of Care  Date of Admission  08/07/17  Date first seen by Palliative Care  08/08/17  # of days Palliative referral response time  0 Day(s)  # of days IP prior to Palliative referral  1  Clinical Assessment  Palliative Performance Scale Score  20%  Pain Max last 24 hours  Not able to report  Pain Min Last 24 hours  Not able to report  Dyspnea Max Last 24 Hours  Not able to report  Dyspnea Min Last 24 hours  Not able to report  Psychosocial & Spiritual Assessment  Palliative Care Outcomes      Time In: 1520 Time Out: 1610 Time Total: 50 minutes Greater than 50%  of this time was spent counseling and coordinating care related to the above assessment and plan.  Signed by: Drue Novel, NP   Please contact Palliative Medicine Team phone at [phone] for questions and concerns.  For individual provider: See Shea Evans

## 2017-08-08 NOTE — Progress Notes (Signed)
PROGRESS NOTE  Steven [name]  QBV:[id number]  DOB: 09/13/1928  DOA: 08/07/2017 PCP: [name], [name]   Brief Admission Hx: 84 y.o. male with medical history significant of COPD, hypertension, chronic kidney disease stage III admitted with pneumonia, and acute on chronic respiratory failure.    MDM/Assessment & Plan:    1. Acute respiratory failure with hypoxia and hypercapnia.  Currently on BiPAP.  Rate was increased early this morning.  Will continue to follow.  I discussed with RRT.  Likely precipitated by pneumonia in the setting of COPD.  Continue BiPAP for now.  Once mental status has significantly improved, can give a trial off of BiPAP but he remains HIGH RISK for intubation.  Will ask for pulm consult tomorrow if not able to get off bipap. 2. Healthcare associated pneumonia.  Currently on vancomycin and cefepime.  Can DC vanc as MRSA screen negative.  3. Hypernatremia - change IVF to 0.45 NS.   4. Hyperkalemia - Kayexalate ordered.   5. CKD stage 3 - unchanged to slightly improved. Will continue to follow.  6. COPD.  Continue bronchodilators. 7. Chronic kidney disease stage III.  Creatinine is currently at baseline.  Continue to monitor. 8. BPH.  Stable.  Continue outpatient regimen of Cardura.  9. Hypertension.  Will hold lisinopril for now due to renal dysfunction.  DVT prophylaxis: lovenox  Code Status: full code  Family Communication: no family present, will attempt to call later to clarify goals of care    Disposition Plan: pending hospital course  Consults called:   Admission status: inpatient, stepdown   Subjective: Pt confused.  He is on bipap but does not like the mask.   Objective: Vitals:   08/08/17 0405 08/08/17 0500 08/08/17 0554 08/08/17 0600  BP: (!) 155/53 (!) 138/50  (!) 119/42  Pulse: 94 88 84 79  Resp: 15 13 19 18   Temp:      TempSrc:      SpO2: 100% 96% 100% 100%  Weight:  61.3 kg (135 lb 2.3 oz)    Height:        Intake/Output  Summary (Last 24 hours) at 08/08/2017 0656 Last data filed at 08/08/2017 0500 Gross per 24 hour  Intake 2885 ml  Output 400 ml  Net 2485 ml   Filed Weights   08/07/17 1236 08/07/17 2026 08/08/17 0500  Weight: 60.3 kg (133 lb) 61.3 kg (135 lb 2.3 oz) 61.3 kg (135 lb 2.3 oz)     REVIEW OF SYSTEMS  As per history otherwise all reviewed and reported negative  Exam:  General exam: Pt on bipap.  Pt appears chronically ill.  NAD.  Respiratory system: bipap. Diffuse rales.   Cardiovascular system: normal s1,s2 sounds. Gastrointestinal system: Abdomen is nondistended, soft and nontender. Normal bowel sounds heard. Central nervous system: Alert and confused. No focal neurological deficits. Extremities: no cyanosis.  Data Reviewed: Basic Metabolic Panel: Recent Labs  Lab 08/06/17 1210 08/07/17 1316 08/08/17 0402  NA 143 142 147*  K 5.0 5.0 5.3*  CL 103 106 113*  CO2 27 30 28   GLUCOSE 132* 114* 94  BUN 34* 39* 32*  CREATININE 1.69* 1.62* 1.46*  CALCIUM 9.2 8.9 8.6*   Liver Function Tests: Recent Labs  Lab 08/06/17 1210 08/07/17 1316  AST 24 22  ALT 27 29  ALKPHOS 73 70  BILITOT <0.2 0.3  PROT 6.5 7.3  ALBUMIN 3.1* 2.7*   No results for input(s): LIPASE, AMYLASE in the last 168 hours. No results for  input(s): AMMONIA in the last 168 hours. CBC: Recent Labs  Lab 08/06/17 1210 08/07/17 1316 08/08/17 0402  WBC 7.3 6.5 6.1  NEUTROABS 5.4 4.5  --   HGB 8.1* 8.3* 8.1*  HCT 26.0* 27.9* 28.5*  MCV 91 97.2 99.0  PLT 346 359 311   Cardiac Enzymes: Recent Labs  Lab 08/07/17 1316  TROPONINI <0.03   CBG (last 3)  No results for input(s): GLUCAP in the last 72 hours. Recent Results (from the past 240 hour(s))  Blood Culture (routine x 2)     Status: None (Preliminary result)   Collection Time: 08/07/17  1:16 PM  Result Value Ref Range Status   Specimen Description BLOOD RIGHT ARM DRAWN BY RN  Final   Special Requests   Final    BOTTLES DRAWN AEROBIC AND ANAEROBIC  Blood Culture adequate volume   Culture   Final    NO GROWTH < 24 HOURS Performed at [facility], [address] [place], [place] [address]    Report Status PENDING  Incomplete  Blood Culture (routine x 2)     Status: None (Preliminary result)   Collection Time: 08/07/17  1:16 PM  Result Value Ref Range Status   Specimen Description BLOOD RIGHT HAND DRAWN BY RN  Final   Special Requests   Final    BOTTLES DRAWN AEROBIC AND ANAEROBIC Blood Culture adequate volume   Culture   Final    NO GROWTH < 24 HOURS Performed at [facility], [address] [place] [address], [place], [place] [address]    Report Status PENDING  Incomplete  MRSA PCR Screening     Status: None   Collection Time: 08/07/17  8:25 PM  Result Value Ref Range Status   MRSA by PCR NEGATIVE NEGATIVE Final    Comment:        The GeneXpert MRSA Assay (FDA approved for NASAL specimens only), is one component of a comprehensive MRSA colonization surveillance program. It is not intended to diagnose MRSA infection nor to guide or monitor treatment for MRSA infections. Performed at [facility], [address], [place], [place] [address]      Studies: Dg Chest 1 View  Result Date: 08/07/2017 CLINICAL DATA:  Shortness of breath EXAM: CHEST  1 VIEW COMPARISON:  August 06, 2017 FINDINGS: There is somewhat less patchy airspace opacity in the right base compared to 1 day prior. There is atelectatic change in each lung base currently. Heart is upper normal in size with pulmonary vascularity normal. Patient is status post coronary artery bypass grafting. No adenopathy. No evident lesions. IMPRESSION: Partial clearing right base. Mild bibasilar atelectasis. Stable cardiac silhouette. Electronically Signed   By: Lowella Grip III M.D.   On: 08/07/2017 13:40   Dg Chest 2 View  Result Date: 08/06/2017 CLINICAL DATA:  Cough and congestion EXAM: CHEST - 2 VIEW COMPARISON:  Jun 13, 2017 FINDINGS: There is patchy airspace consolidation in the right base.  Lungs are otherwise clear although hyperexpanded. Heart size and pulmonary vascularity are normal. Patient is status post coronary artery bypass grafting. There is aortic atherosclerosis. No adenopathy. No evident bone lesions. IMPRESSION: Airspace consolidation consistent with pneumonia right base. Lungs elsewhere clear although hyperexpanded. Stable cardiac silhouette. Status post coronary artery bypass grafting. There is aortic atherosclerosis. Aortic Atherosclerosis (ICD10-I70.0). Electronically Signed   By: Lowella Grip III M.D.   On: 08/06/2017 15:41   Scheduled Meds: . aspirin EC  81 mg Oral Daily  . chlorhexidine  15 mL Mouth Rinse BID  .  doxazosin  2 mg Oral Daily  . enoxaparin (LOVENOX) injection  40 mg Subcutaneous Q24H  . gabapentin  100 mg Oral BID  . ipratropium-albuterol  3 mL Nebulization Q6H  . mouth rinse  15 mL Mouth Rinse q12n4p  . sodium polystyrene  30 g Oral Once   Continuous Infusions: . sodium chloride    . azithromycin Stopped (08/07/17 1509)  . cefTRIAXone (ROCEPHIN)  IV Stopped (08/07/17 1401)    Active Problems:   Anxiety state   Essential hypertension   COPD with emphysema (HCC)   BPH (benign prostatic hyperplasia)   CKD (chronic kidney disease), stage III (HCC)   Acute respiratory failure with hypoxia and hypercapnia (HCC)   PNA (pneumonia)   Critical Care Time spent: 68 minutes  Irwin Brakeman, MD, FAAFP [facility] Pager [phone]  If 7PM-7AM, please contact night-coverage [email or web address] Password [email or web address] 08/08/2017, 6:56 AM    LOS: 1 day

## 2017-08-08 NOTE — Progress Notes (Signed)
Pt removed BIPAP mask after being placed on it for 20 minutes. He stated he didn't want the mask. Pt also began to removed EKG wires and O2 probe from finger. MD paged. Received order for one time does of Lorazepam. Dose held for now due to pt resting quietly in the room.  Will continue to monitor.   Margaret Pyle, RN

## 2017-08-09 DIAGNOSIS — F05 Delirium due to known physiological condition: Secondary | ICD-10-CM | POA: Clinically undetermined

## 2017-08-09 LAB — BASIC METABOLIC PANEL
Anion gap: 8 (ref 5–15)
BUN: 19 mg/dL (ref 8–23)
CO2: 29 mmol/L (ref 22–32)
Calcium: 8.7 mg/dL — ABNORMAL LOW (ref 8.9–10.3)
Chloride: 105 mmol/L (ref 98–111)
Creatinine, Ser: 1.09 mg/dL (ref 0.61–1.24)
GFR calc Af Amer: >60 mL/min (ref >60)
GFR calc non Af Amer: 59 mL/min — ABNORMAL LOW (ref >60)
Glucose, Bld: 122 mg/dL — ABNORMAL HIGH (ref 70–99)
Potassium: 4.3 mmol/L (ref 3.5–5.1)
Sodium: 142 mmol/L (ref 135–145)

## 2017-08-09 LAB — BLOOD GAS, ARTERIAL
Acid-Base Excess: 0 mmol/L (ref 0.0–2.0)
Acid-Base Excess: 2 mmol/L (ref 0.0–2.0)
Bicarbonate: 22.6 mmol/L (ref 20.0–28.0)
Bicarbonate: 25.4 mmol/L (ref 20.0–28.0)
Delivery systems: POSITIVE
Drawn by: 213101
Drawn by: 234301
Expiratory PAP: 6
FIO2: 40
Inspiratory PAP: 14
O2 Content: 3 L/min
O2 Saturation: 98.2 %
O2 Saturation: 93.2 %
Patient temperature: 37
Patient temperature: 37
pCO2 arterial: 68.7 mmHg (ref 32.0–48.0)
pCO2 arterial: 54.8 mmHg — ABNORMAL HIGH (ref 32.0–48.0)
pH, Arterial: 7.218 — ABNORMAL LOW (ref 7.350–7.450)
pH, Arterial: 7.32 — ABNORMAL LOW (ref 7.350–7.450)
pO2, Arterial: 146 mmHg — ABNORMAL HIGH (ref 83.0–108.0)
pO2, Arterial: 74.4 mmHg — ABNORMAL LOW (ref 83.0–108.0)

## 2017-08-09 LAB — CBC WITH DIFFERENTIAL/PLATELET
Basophils Absolute: 0 10*3/uL (ref 0.0–0.1)
Basophils Relative: 1 %
Eosinophils Absolute: 0.2 10*3/uL (ref 0.0–0.7)
Eosinophils Relative: 3 %
HCT: 27.6 % — ABNORMAL LOW (ref 39.0–52.0)
Hemoglobin: 8.2 g/dL — ABNORMAL LOW (ref 13.0–17.0)
Lymphocytes Relative: 12 %
Lymphs Abs: 0.8 10*3/uL (ref 0.7–4.0)
MCH: 28.9 pg (ref 26.0–34.0)
MCHC: 29.7 g/dL — ABNORMAL LOW (ref 30.0–36.0)
MCV: 97.2 fL (ref 78.0–100.0)
Monocytes Absolute: 0.6 10*3/uL (ref 0.1–1.0)
Monocytes Relative: 8 %
Neutro Abs: 5.4 10*3/uL (ref 1.7–7.7)
Neutrophils Relative %: 76 %
Platelets: 266 10*3/uL (ref 150–400)
RBC: 2.84 MIL/uL — ABNORMAL LOW (ref 4.22–5.81)
RDW: 14.7 % (ref 11.5–15.5)
WBC: 7 10*3/uL (ref 4.0–10.5)

## 2017-08-09 MED ORDER — LORAZEPAM 2 MG/ML IJ SOLN
0.5000 mg | Freq: Once | INTRAMUSCULAR | Status: AC
  Administered 2017-08-09: 0.5 mg via INTRAVENOUS
  Filled 2017-08-09: qty 1

## 2017-08-09 MED ORDER — HALOPERIDOL LACTATE 5 MG/ML IJ SOLN
3.5000 mg | Freq: Once | INTRAMUSCULAR | Status: AC
  Administered 2017-08-09: 3.5 mg via INTRAVENOUS
  Filled 2017-08-09: qty 1

## 2017-08-09 MED ORDER — METHYLPREDNISOLONE SODIUM SUCC 40 MG IJ SOLR
40.0000 mg | Freq: Four times a day (QID) | INTRAMUSCULAR | Status: DC
  Administered 2017-08-09 – 2017-08-12 (×12): 40 mg via INTRAVENOUS
  Filled 2017-08-09 (×12): qty 1

## 2017-08-09 MED ORDER — DILTIAZEM HCL ER COATED BEADS 120 MG PO CP24
120.0000 mg | ORAL_CAPSULE | Freq: Every day | ORAL | Status: DC
  Administered 2017-08-09 – 2017-08-15 (×7): 120 mg via ORAL
  Filled 2017-08-09 (×7): qty 1

## 2017-08-09 MED ORDER — HALOPERIDOL LACTATE 5 MG/ML IJ SOLN
5.0000 mg | Freq: Four times a day (QID) | INTRAMUSCULAR | Status: DC | PRN
  Administered 2017-08-09 – 2017-08-10 (×2): 5 mg via INTRAVENOUS
  Filled 2017-08-09 (×2): qty 1

## 2017-08-09 MED ORDER — HYDRALAZINE HCL 20 MG/ML IJ SOLN
10.0000 mg | INTRAMUSCULAR | Status: DC | PRN
  Administered 2017-08-09: 10 mg via INTRAVENOUS
  Filled 2017-08-09: qty 1

## 2017-08-09 MED ORDER — IPRATROPIUM-ALBUTEROL 0.5-2.5 (3) MG/3ML IN SOLN
3.0000 mL | RESPIRATORY_TRACT | Status: DC
  Administered 2017-08-09 (×5): 3 mL via RESPIRATORY_TRACT
  Filled 2017-08-09 (×4): qty 3

## 2017-08-09 NOTE — Consult Note (Signed)
Consult requested by: [facility] hospitalist Dr. Wynetta Emery Consult requested for: Respiratory failure hypoxic and hypercapnic  HPI: This is an 84 year old who has history of COPD hypertension chronic kidney disease BPH and dementia.  He came to the hospital because of shortness of breath and he was noted to be hypoxic and hypercapnic.  He was started on BiPAP and he says he is better this morning.  Denies chest pain hemoptysis nausea or vomiting headache fever or chills but is not clear if his review of systems is accurate because of his known history of dementia  Past Medical History:  Diagnosis Date  . Anxiety   . Cancer [facility]) 2013   Lung - cancer free x3 years.   . Hyperlipidemia   . Hypertension   . Myocardial infarction ([place]) 1999     Family History  Problem Relation Age of Onset  . Cancer Mother   . Pulmonary embolism Father      Social History   Socioeconomic History  . Marital status: Divorced    Spouse name: Not on file  . Number of children: Not on file  . Years of education: Not on file  . Highest education level: Not on file  Occupational History  . Not on file  Social Needs  . Financial resource strain: Not on file  . Food insecurity:    Worry: Not on file    Inability: Not on file  . Transportation needs:    Medical: Not on file    Non-medical: Not on file  Tobacco Use  . Smoking status: Former Smoker    Packs/day: 2.00    Years: 73.00    Pack years: 146.00    Types: Cigarettes    Start date: 02/05/2006    Last attempt to quit: 11/05/2006    Years since quitting: 10.7  . Smokeless tobacco: Never Used  Substance and Sexual Activity  . Alcohol use: No  . Drug use: No  . Sexual activity: Not on file  Lifestyle  . Physical activity:    Days per week: Not on file    Minutes per session: Not on file  . Stress: Not on file  Relationships  . Social connections:    Talks on phone: Not on file    Gets together: Not on file    Attends religious service: Not on  file    Active member of club or organization: Not on file    Attends meetings of clubs or organizations: Not on file    Relationship status: Not on file  Other Topics Concern  . Not on file  Social History Narrative  . Not on file     ROS: Likely not accurate due to dementia    Objective: Vital signs in last 24 hours: Temp:  [98.1 F (36.7 C)-98.8 F (37.1 C)] 98.8 F (37.1 C) (07/05 0745) Pulse Rate:  [75-100] 100 (07/05 0615) Resp:  [12-27] 23 (07/05 0615) BP: (99-188)/(38-99) 188/75 (07/05 0615) SpO2:  [79 %-100 %] 100 % (07/05 0758) FiO2 (%):  [30 %] 30 % (07/05 0758) Weight:  [62.7 kg (138 lb 3.7 oz)] 62.7 kg (138 lb 3.7 oz) (07/05 0300) Weight change: 2.371 kg (5 lb 3.7 oz) Last BM Date: 08/08/17  Intake/Output from previous day: 07/04 0701 - 07/05 0700 In: 2278.8 [P.O.:720; I.V.:1237.2; IV Piggyback:321.7] Out: 300 [Urine:300]  PHYSICAL EXAM Constitutional: He is very thin.  Eyes: Pupils react EOMI.  Ears nose mouth and throat: His mucous membranes are moist.  Tympanic membranes are  intact.  Hearing is grossly normal.  Cardiovascular: His heart is regular with normal heart sounds.  Respiratory: His respiratory effort is increased and he has bilateral rhonchi.  Gastrointestinal: His abdomen is soft with no masses.  Musculoskeletal: Grossly normal strength in the upper and lower extremities.  Neurological: He is mildly confused psychiatric: Normal mood and affect  Lab Results: Basic Metabolic Panel: Recent Labs    08/07/17 1316 08/08/17 0402  NA 142 147*  K 5.0 5.3*  CL 106 113*  CO2 30 28  GLUCOSE 114* 94  BUN 39* 32*  CREATININE 1.62* 1.46*  CALCIUM 8.9 8.6*   Liver Function Tests: Recent Labs    08/06/17 1210 08/07/17 1316  AST 24 22  ALT 27 29  ALKPHOS 73 70  BILITOT <0.2 0.3  PROT 6.5 7.3  ALBUMIN 3.1* 2.7*   No results for input(s): LIPASE, AMYLASE in the last 72 hours. No results for input(s): AMMONIA in the last 72 hours. CBC: Recent  Labs    08/07/17 1316 08/08/17 0402 08/09/17 0753  WBC 6.5 6.1 7.0  NEUTROABS 4.5  --  5.4  HGB 8.3* 8.1* 8.2*  HCT 27.9* 28.5* 27.6*  MCV 97.2 99.0 97.2  PLT 359 311 266   Cardiac Enzymes: Recent Labs    08/07/17 1316  TROPONINI <0.03   BNP: No results for input(s): PROBNP in the last 72 hours. D-Dimer: No results for input(s): DDIMER in the last 72 hours. CBG: No results for input(s): GLUCAP in the last 72 hours. Hemoglobin A1C: No results for input(s): HGBA1C in the last 72 hours. Fasting Lipid Panel: No results for input(s): CHOL, HDL, LDLCALC, TRIG, CHOLHDL, LDLDIRECT in the last 72 hours. Thyroid Function Tests: No results for input(s): TSH, T4TOTAL, FREET4, T3FREE, THYROIDAB in the last 72 hours. Anemia Panel: No results for input(s): VITAMINB12, FOLATE, FERRITIN, TIBC, IRON, RETICCTPCT in the last 72 hours. Coagulation: No results for input(s): LABPROT, INR in the last 72 hours. Urine Drug Screen: Drugs of Abuse  No results found for: LABOPIA, COCAINSCRNUR, LABBENZ, AMPHETMU, THCU, LABBARB  Alcohol Level: No results for input(s): ETH in the last 72 hours. Urinalysis: Recent Labs    08/07/17 1252  COLORURINE YELLOW  LABSPEC 1.020  PHURINE 5.0  GLUCOSEU NEGATIVE  HGBUR NEGATIVE  BILIRUBINUR NEGATIVE  KETONESUR NEGATIVE  PROTEINUR NEGATIVE  NITRITE NEGATIVE  LEUKOCYTESUR TRACE*   Misc. Labs:   ABGS: Recent Labs    08/09/17 0830  PHART 7.320*  PO2ART 74.4*  HCO3 25.4     MICROBIOLOGY: Recent Results (from the past 240 hour(s))  Blood Culture (routine x 2)     Status: None (Preliminary result)   Collection Time: 08/07/17  1:16 PM  Result Value Ref Range Status   Specimen Description BLOOD RIGHT ARM DRAWN BY RN  Final   Special Requests   Final    BOTTLES DRAWN AEROBIC AND ANAEROBIC Blood Culture adequate volume   Culture   Final    NO GROWTH 2 DAYS Performed at [facility], [address] [place], [place] [address]    Report Status  PENDING  Incomplete  Blood Culture (routine x 2)     Status: None (Preliminary result)   Collection Time: 08/07/17  1:16 PM  Result Value Ref Range Status   Specimen Description BLOOD RIGHT HAND DRAWN BY RN  Final   Special Requests   Final    BOTTLES DRAWN AEROBIC AND ANAEROBIC Blood Culture adequate volume   Culture   Final  NO GROWTH 2 DAYS Performed at [facility], [address] [place], [place] [address]    Report Status PENDING  Incomplete  MRSA PCR Screening     Status: None   Collection Time: 08/07/17  8:25 PM  Result Value Ref Range Status   MRSA by PCR NEGATIVE NEGATIVE Final    Comment:        The GeneXpert MRSA Assay (FDA approved for NASAL specimens only), is one component of a comprehensive MRSA colonization surveillance program. It is not intended to diagnose MRSA infection nor to guide or monitor treatment for MRSA infections. Performed at [facility], [address], [place], [place] [address]     Studies/Results: Dg Chest 1 View  Result Date: 08/07/2017 CLINICAL DATA:  Shortness of breath EXAM: CHEST  1 VIEW COMPARISON:  August 06, 2017 FINDINGS: There is somewhat less patchy airspace opacity in the right base compared to 1 day prior. There is atelectatic change in each lung base currently. Heart is upper normal in size with pulmonary vascularity normal. Patient is status post coronary artery bypass grafting. No adenopathy. No evident lesions. IMPRESSION: Partial clearing right base. Mild bibasilar atelectasis. Stable cardiac silhouette. Electronically Signed   By: Lowella Grip III M.D.   On: 08/07/2017 13:40    Medications:  Prior to Admission:  Medications Prior to Admission  Medication Sig Dispense Refill Last Dose  . albuterol (PROVENTIL HFA;VENTOLIN HFA) 108 (90 Base) MCG/ACT inhaler Inhale 1-2 puffs into the lungs every 4 (four) hours as needed for wheezing or shortness of breath. 1 Inhaler 0 Taking  . albuterol (PROVENTIL) (2.5 MG/3ML) 0.083%  nebulizer solution Take 3 mLs (2.5 mg total) by nebulization every 2 (two) hours as needed for wheezing or shortness of breath. 75 mL 12 Taking  . aspirin 81 MG tablet Take 81 mg by mouth daily.    08/06/2017 at Unknown time  . calcium carbonate (TUMS - DOSED IN MG ELEMENTAL CALCIUM) 500 MG chewable tablet Chew 1 tablet by mouth daily.   08/06/2017 at Unknown time  . clonazePAM (KLONOPIN) 1 MG tablet Take 1 tablet (1 mg total) by mouth 3 (three) times daily as needed. 90 tablet 2 08/06/2017 at Unknown time  . doxazosin (CARDURA) 2 MG tablet Take 1 tablet (2 mg total) by mouth daily. 90 tablet 1 08/06/2017 at Unknown time  . gabapentin (NEURONTIN) 100 MG capsule Take 1 capsule (100 mg total) by mouth 2 (two) times daily. 60 capsule 3 08/07/2017 at Unknown time  . lisinopril (PRINIVIL,ZESTRIL) 20 MG tablet Take 1 tablet (20 mg total) by mouth daily. 90 tablet 1 08/06/2017 at Unknown time  . Multiple Vitamins-Iron (MULTIVITAMINS WITH IRON) TABS tablet Take 1 tablet by mouth daily.   08/06/2017 at Unknown time  . atorvastatin (LIPITOR) 10 MG tablet Take 1 tablet (10 mg total) by mouth daily. (Patient not taking: Reported on 08/07/2017) 90 tablet 1 Not Taking at Unknown time   Scheduled: . aspirin EC  81 mg Oral Daily  . chlorhexidine  15 mL Mouth Rinse BID  . doxazosin  2 mg Oral Daily  . enoxaparin (LOVENOX) injection  40 mg Subcutaneous Q24H  . gabapentin  100 mg Oral BID  . ipratropium-albuterol  3 mL Nebulization Q4H  . mouth rinse  15 mL Mouth Rinse q12n4p  . methylPREDNISolone (SOLU-MEDROL) injection  40 mg Intravenous Q6H   Continuous: . sodium chloride 10 mL/hr at 08/09/17 0735  . azithromycin Stopped (08/08/17 1446)  . cefTRIAXone (ROCEPHIN)  IV Stopped (  08/08/17 1345)   LSL:[id number] **OR** acetaminophen, albuterol, clonazePAM, haloperidol lactate, ondansetron **OR** ondansetron (ZOFRAN) IV  Assesment: He was admitted with COPD exacerbation community-acquired pneumonia and acute hypoxic and  hypercapnic respiratory failure.  He is been on BiPAP but seems a little better now.  He is on high flow nasal cannula now.  He has some element of dementia and had sundowning earlier and is still a little confused this morning. Active Problems:   Anxiety state   Essential hypertension   COPD with emphysema (HCC)   BPH (benign prostatic hyperplasia)   CKD (chronic kidney disease), stage III (HCC)   Acute respiratory failure with hypoxia and hypercapnia (HCC)   PNA (pneumonia)   Community acquired pneumonia of right lower lobe of lung ([place])   Goals of care, counseling/discussion   Palliative care by specialist   DNR (do not resuscitate) discussion    Plan: He is on appropriate treatment.  Continue BiPAP as needed.  Continue high flow nasal cannula when he is not on the BiPAP.  Continue antibiotics inhaled bronchodilators and steroids  Thanks for allowing me to see him with you    LOS: 2 days   Brigett Estell L 08/09/2017, 9:21 AM

## 2017-08-09 NOTE — Progress Notes (Signed)
Palliative: Steven [name] is lying quietly in bed.  He is sleeping very soundly, and I do not try to wake him.  He looks acutely/chronically ill.  Nursing staff states that he had a very difficult night, consistently attempting to remove BiPAP, pulling off leads, uncomfortable.  PRN Ativan and as needed Haldol given to no effect.  Nursing staff states that Klonopin has helped him rest.  There is no family at bedside at this time. Call to daughter, Cristina Gong at [phone].  Ms. answers the phone stating that [name] is not there, she is [name] former wife.  She did not know that [name] was in the hospital.  She is unable to give me Janet's current phone number. Conversation with nursing staff related to symptom management, plan of care. Conversation with hospitalist related to plan of care, symptom management. 74 minutes Quinn Axe, NP Palliative Medicine Team Team Phone # [phone]

## 2017-08-09 NOTE — Care Management (Addendum)
Noted CM consult for home health. Reviewed chart.  Palliative consulted for [place]. Patient requiring Bipap last night and very sleepy today.  CM will follow. Daughter's number found in ER notes, updated in contact list.

## 2017-08-09 NOTE — Progress Notes (Signed)
Ativan has ZERO effect on patient. Makes pt more confused. Continuously trying to get out of bed and removing condom cath and all wires. Mitts placed on patient.

## 2017-08-09 NOTE — Progress Notes (Signed)
PROGRESS NOTE  Steven [name]  FXT:[id number]  DOB: [birth year]  DOA: 08/07/2017 PCP: [name], [name]   Brief Admission Hx: 84 y.o. male with medical history significant of COPD, hypertension, chronic kidney disease stage III admitted with pneumonia, and acute on chronic respiratory failure.   MDM/Assessment & Plan:   1. Acute respiratory failure with hypoxia and hypercapnia.  Currently on BiPAP.  Rate was increased early this morning. Will continue to follow.  I discussed with RRT.  Likely precipitated by pneumonia in the setting of COPD.  Continue BiPAP.  Ask for pulmonary consultation.   2. Healthcare associated pneumonia. Continue antibiotics.  3. Hypernatremia - change IVF to 0.45 NS.   4. Hyperkalemia - Kayexalate ordered.   5. CKD stage 3 - unchanged to slightly improved. Will continue to follow.  6. COPD with acute exacerbation.  Continue bronchodilators.  Added antibiotics and IV steroids. 7. Chronic kidney disease stage III.  Creatinine is currently at baseline.  Continue to monitor. 8. BPH.  Stable.  Continue outpatient regimen of Cardura.  9. Hypertension.  Will hold lisinopril for now due to renal dysfunction. 10. Dementia with behavioral disturbance - Pt having sundowning.  Ordered as needed lorazepam/haloperidol IV.  DVT prophylaxis: lovenox  Code Status: full code  Family Communication: no family present Disposition Plan: pending hospital course  Consults called:   Admission status: inpatient, stepdown   Subjective: Pt remains confused and agitated at times, wanting to remove bipap.  Objective: Vitals:   08/09/17 0300 08/09/17 0303 08/09/17 0400 08/09/17 0615  BP:  (!) 181/63 (!) 136/54 (!) 188/75  Pulse:  99 82 100  Resp:  (!) 27 18 (!) 23  Temp: 98.1 F (36.7 C)     TempSrc: Oral     SpO2:  92% 94% 100%  Weight: 62.7 kg (138 lb 3.7 oz)     Height:        Intake/Output Summary (Last 24 hours) at 08/09/2017 0718 Last data filed at 08/09/2017  0300 Gross per 24 hour  Intake 2278.83 ml  Output 300 ml  Net 1978.83 ml   Filed Weights   08/07/17 2026 08/08/17 0500 08/09/17 0300  Weight: 61.3 kg (135 lb 2.3 oz) 61.3 kg (135 lb 2.3 oz) 62.7 kg (138 lb 3.7 oz)     REVIEW OF SYSTEMS  As per history otherwise all reviewed and reported negative  Exam:  General exam: Pt on bipap.  Pt appears chronically ill.  NAD.  Respiratory system: bipap. Diffuse rales. Poor air movement.    Cardiovascular system: normal s1,s2 sounds. Gastrointestinal system: Abdomen is nondistended, soft and nontender. Normal bowel sounds heard. Central nervous system: Alert and confused. No focal neurological deficits. Extremities: no cyanosis.  Data Reviewed: Basic Metabolic Panel: Recent Labs  Lab 08/06/17 1210 08/07/17 1316 08/08/17 0402  NA 143 142 147*  K 5.0 5.0 5.3*  CL 103 106 113*  CO2 27 30 28   GLUCOSE 132* 114* 94  BUN 34* 39* 32*  CREATININE 1.69* 1.62* 1.46*  CALCIUM 9.2 8.9 8.6*   Liver Function Tests: Recent Labs  Lab 08/06/17 1210 08/07/17 1316  AST 24 22  ALT 27 29  ALKPHOS 73 70  BILITOT <0.2 0.3  PROT 6.5 7.3  ALBUMIN 3.1* 2.7*   No results for input(s): LIPASE, AMYLASE in the last 168 hours. No results for input(s): AMMONIA in the last 168 hours. CBC: Recent Labs  Lab 08/06/17 1210 08/07/17 1316 08/08/17 0402  WBC 7.3 6.5 6.1  NEUTROABS 5.4  4.5  --   HGB 8.1* 8.3* 8.1*  HCT 26.0* 27.9* 28.5*  MCV 91 97.2 99.0  PLT 346 359 311   Cardiac Enzymes: Recent Labs  Lab 08/07/17 1316  TROPONINI <0.03   CBG (last 3)  No results for input(s): GLUCAP in the last 72 hours. Recent Results (from the past 240 hour(s))  Blood Culture (routine x 2)     Status: None (Preliminary result)   Collection Time: 08/07/17  1:16 PM  Result Value Ref Range Status   Specimen Description BLOOD RIGHT ARM DRAWN BY RN  Final   Special Requests   Final    BOTTLES DRAWN AEROBIC AND ANAEROBIC Blood Culture adequate volume    Culture   Final    NO GROWTH < 24 HOURS Performed at [facility], [address], [place], [place] [address]    Report Status PENDING  Incomplete  Blood Culture (routine x 2)     Status: None (Preliminary result)   Collection Time: 08/07/17  1:16 PM  Result Value Ref Range Status   Specimen Description BLOOD RIGHT HAND DRAWN BY RN  Final   Special Requests   Final    BOTTLES DRAWN AEROBIC AND ANAEROBIC Blood Culture adequate volume   Culture   Final    NO GROWTH < 24 HOURS Performed at [facility], [address] [place], [place] [address]    Report Status PENDING  Incomplete  MRSA PCR Screening     Status: None   Collection Time: 08/07/17  8:25 PM  Result Value Ref Range Status   MRSA by PCR NEGATIVE NEGATIVE Final    Comment:        The GeneXpert MRSA Assay (FDA approved for NASAL specimens only), is one component of a comprehensive MRSA colonization surveillance program. It is not intended to diagnose MRSA infection nor to guide or monitor treatment for MRSA infections. Performed at [facility], [address] [place],  [address]      Studies: Dg Chest 1 View  Result Date: 08/07/2017 CLINICAL DATA:  Shortness of breath EXAM: CHEST  1 VIEW COMPARISON:  August 06, 2017 FINDINGS: There is somewhat less patchy airspace opacity in the right base compared to 1 day prior. There is atelectatic change in each lung base currently. Heart is upper normal in size with pulmonary vascularity normal. Patient is status post coronary artery bypass grafting. No adenopathy. No evident lesions. IMPRESSION: Partial clearing right base. Mild bibasilar atelectasis. Stable cardiac silhouette. Electronically Signed   By: Lowella Grip III M.D.   On: 08/07/2017 13:40   Scheduled Meds: . aspirin EC  81 mg Oral Daily  . chlorhexidine  15 mL Mouth Rinse BID  . doxazosin  2 mg Oral Daily  . enoxaparin (LOVENOX) injection  40 mg Subcutaneous Q24H  . gabapentin  100 mg Oral BID  .  ipratropium-albuterol  3 mL Nebulization Q4H  . mouth rinse  15 mL Mouth Rinse q12n4p  . methylPREDNISolone (SOLU-MEDROL) injection  40 mg Intravenous Q6H   Continuous Infusions: . sodium chloride 65 mL/hr at 08/09/17 0209  . azithromycin Stopped (08/08/17 1446)  . cefTRIAXone (ROCEPHIN)  IV Stopped (08/08/17 1345)    Active Problems:   Anxiety state   Essential hypertension   COPD with emphysema (HCC)   BPH (benign prostatic hyperplasia)   CKD (chronic kidney disease), stage III (HCC)   Acute respiratory failure with hypoxia and hypercapnia (HCC)   PNA (pneumonia)   Community acquired pneumonia of right lower  lobe of lung ([place])   Goals of care, counseling/discussion   Palliative care by specialist   DNR (do not resuscitate) discussion   Critical Care Time spent: 31 minutes  Irwin Brakeman, MD, FAAFP [facility] Pager [phone]  If 7PM-7AM, please contact night-coverage [email or web address] Password [email or web address] 08/09/2017, 7:18 AM    LOS: 2 days

## 2017-08-09 NOTE — Progress Notes (Signed)
Pt continuously trying to get out of bed. Still refusing BIPAP. Lorazepam dose given. No relief.

## 2017-08-10 LAB — CBC WITH DIFFERENTIAL/PLATELET
Basophils Absolute: 0 10*3/uL (ref 0.0–0.1)
Basophils Relative: 0 %
Eosinophils Absolute: 0 10*3/uL (ref 0.0–0.7)
Eosinophils Relative: 0 %
HCT: 26.3 % — ABNORMAL LOW (ref 39.0–52.0)
Hemoglobin: 7.9 g/dL — ABNORMAL LOW (ref 13.0–17.0)
Lymphocytes Relative: 7 %
Lymphs Abs: 0.4 10*3/uL — ABNORMAL LOW (ref 0.7–4.0)
MCH: 28.9 pg (ref 26.0–34.0)
MCHC: 30 g/dL (ref 30.0–36.0)
MCV: 96.3 fL (ref 78.0–100.0)
Monocytes Absolute: 0.1 10*3/uL (ref 0.1–1.0)
Monocytes Relative: 2 %
Neutro Abs: 4.5 10*3/uL (ref 1.7–7.7)
Neutrophils Relative %: 91 %
Platelets: 301 10*3/uL (ref 150–400)
RBC: 2.73 MIL/uL — ABNORMAL LOW (ref 4.22–5.81)
RDW: 14.7 % (ref 11.5–15.5)
WBC: 4.9 10*3/uL (ref 4.0–10.5)

## 2017-08-10 LAB — BASIC METABOLIC PANEL
Anion gap: 7 (ref 5–15)
BUN: 14 mg/dL (ref 8–23)
CO2: 30 mmol/L (ref 22–32)
Calcium: 8.7 mg/dL — ABNORMAL LOW (ref 8.9–10.3)
Chloride: 104 mmol/L (ref 98–111)
Creatinine, Ser: 1.19 mg/dL (ref 0.61–1.24)
GFR calc Af Amer: >60 mL/min (ref >60)
GFR calc non Af Amer: 53 mL/min — ABNORMAL LOW (ref >60)
Glucose, Bld: 165 mg/dL — ABNORMAL HIGH (ref 70–99)
Potassium: 4.1 mmol/L (ref 3.5–5.1)
Sodium: 141 mmol/L (ref 135–145)

## 2017-08-10 LAB — MAGNESIUM: Magnesium: 1.8 mg/dL (ref 1.7–2.4)

## 2017-08-10 MED ORDER — IPRATROPIUM-ALBUTEROL 0.5-2.5 (3) MG/3ML IN SOLN
3.0000 mL | RESPIRATORY_TRACT | Status: DC
  Administered 2017-08-10 – 2017-08-12 (×13): 3 mL via RESPIRATORY_TRACT
  Filled 2017-08-10 (×14): qty 3

## 2017-08-10 NOTE — Progress Notes (Signed)
Subjective: He was admitted with acute hypoxic and hypercapnic respiratory failure.  He has COPD and pneumonia.  He is on antibiotics steroids bronchodilators.  He says he feels a little more short of breath this morning but he has his oxygen off.  Objective: Vital signs in last 24 hours: Temp:  [97.2 F (36.2 C)-98.3 F (36.8 C)] 98 F (36.7 C) (07/06 0926) Pulse Rate:  [69-117] 95 (07/06 0926) Resp:  [17-27] 21 (07/06 0926) BP: (107-204)/(31-99) 133/58 (07/06 0926) SpO2:  [94 %-100 %] 96 % (07/06 0926) Weight:  [61.3 kg (135 lb 2.3 oz)] 61.3 kg (135 lb 2.3 oz) (07/06 0455) Weight change: -1.4 kg (-3 lb 1.4 oz) Last BM Date: 08/08/17  Intake/Output from previous day: 07/05 0701 - 07/06 0700 In: 1342.1 [P.O.:840; I.V.:502.1] Out: 1075 [Urine:1075]  PHYSICAL EXAM General appearance: alert, cooperative and no distress Resp: rhonchi bilaterally Cardio: regular rate and rhythm, S1, S2 normal, no murmur, click, rub or gallop GI: soft, non-tender; bowel sounds normal; no masses,  no organomegaly Extremities: extremities normal, atraumatic, no cyanosis or edema  Lab Results:  Results for orders placed or performed during the hospital encounter of 08/07/17 (from the past 48 hour(s))  Basic metabolic panel     Status: Abnormal   Collection Time: 08/09/17  7:53 AM  Result Value Ref Range   Sodium 142 135 - 145 mmol/L   Potassium 4.3 3.5 - 5.1 mmol/L    Comment: DELTA CHECK NOTED   Chloride 105 98 - 111 mmol/L    Comment: Please note change in reference range.   CO2 29 22 - 32 mmol/L   Glucose, Bld 122 (H) 70 - 99 mg/dL    Comment: Please note change in reference range.   BUN 19 8 - 23 mg/dL    Comment: Please note change in reference range.   Creatinine, Ser 1.09 0.61 - 1.24 mg/dL   Calcium 8.7 (L) 8.9 - 10.3 mg/dL   GFR calc non Af Amer 59 (L) >60 mL/min   GFR calc Af Amer >60 >60 mL/min    Comment: (NOTE) The eGFR has been calculated using the CKD EPI equation. This  calculation has not been validated in all clinical situations. eGFR's persistently <60 mL/min signify possible Chronic Kidney Disease.    Anion gap 8 5 - 15    Comment: Performed at [facility], [address] [place],  [address]  CBC with Differential/Platelet     Status: Abnormal   Collection Time: 08/09/17  7:53 AM  Result Value Ref Range   WBC 7.0 4.0 - 10.5 K/uL   RBC 2.84 (L) 4.22 - 5.81 MIL/uL   Hemoglobin 8.2 (L) 13.0 - 17.0 g/dL   HCT 27.6 (L) 39.0 - 52.0 %   MCV 97.2 78.0 - 100.0 fL   MCH 28.9 26.0 - 34.0 pg   MCHC 29.7 (L) 30.0 - 36.0 g/dL   RDW 14.7 11.5 - 15.5 %   Platelets 266 150 - 400 K/uL   Neutrophils Relative % 76 %   Neutro Abs 5.4 1.7 - 7.7 K/uL   Lymphocytes Relative 12 %   Lymphs Abs 0.8 0.7 - 4.0 K/uL   Monocytes Relative 8 %   Monocytes Absolute 0.6 0.1 - 1.0 K/uL   Eosinophils Relative 3 %   Eosinophils Absolute 0.2 0.0 - 0.7 K/uL   Basophils Relative 1 %   Basophils Absolute 0.0 0.0 - 0.1 K/uL    Comment: Performed at [facility], [address], [place],  [place] [address]  Blood gas, arterial     Status: Abnormal   Collection Time: 08/09/17  8:30 AM  Result Value Ref Range   O2 Content 3.0 L/min   Delivery systems NASAL CANNULA    pH, Arterial 7.320 (L) 7.350 - 7.450   pCO2 arterial 54.8 (H) 32.0 - 48.0 mmHg   pO2, Arterial 74.4 (L) 83.0 - 108.0 mmHg   Bicarbonate 25.4 20.0 - 28.0 mmol/L   Acid-Base Excess 2.0 0.0 - 2.0 mmol/L   O2 Saturation 93.2 %   Patient temperature 37.0    Collection site LEFT RADIAL    Drawn by [id number]    Sample type ARTERIAL DRAW    Allens test (pass/fail) PASS PASS    Comment: Performed at [facility], [address], [place], [place] [address]  Basic metabolic panel     Status: Abnormal   Collection Time: 08/10/17  5:00 AM  Result Value Ref Range   Sodium 141 135 - 145 mmol/L   Potassium 4.1 3.5 - 5.1 mmol/L   Chloride 104 98 - 111 mmol/L    Comment: Please note change in reference range.   CO2 30 22 -  32 mmol/L   Glucose, Bld 165 (H) 70 - 99 mg/dL    Comment: Please note change in reference range.   BUN 14 8 - 23 mg/dL    Comment: Please note change in reference range.   Creatinine, Ser 1.19 0.61 - 1.24 mg/dL   Calcium 8.7 (L) 8.9 - 10.3 mg/dL   GFR calc non Af Amer 53 (L) >60 mL/min   GFR calc Af Amer >60 >60 mL/min    Comment: (NOTE) The eGFR has been calculated using the CKD EPI equation. This calculation has not been validated in all clinical situations. eGFR's persistently <60 mL/min signify possible Chronic Kidney Disease.    Anion gap 7 5 - 15    Comment: Performed at [facility], [address] [place], [place] [address]  CBC with Differential/Platelet     Status: Abnormal   Collection Time: 08/10/17  5:00 AM  Result Value Ref Range   WBC 4.9 4.0 - 10.5 K/uL   RBC 2.73 (L) 4.22 - 5.81 MIL/uL   Hemoglobin 7.9 (L) 13.0 - 17.0 g/dL   HCT 26.3 (L) 39.0 - 52.0 %   MCV 96.3 78.0 - 100.0 fL   MCH 28.9 26.0 - 34.0 pg   MCHC 30.0 30.0 - 36.0 g/dL   RDW 14.7 11.5 - 15.5 %   Platelets 301 150 - 400 K/uL   Neutrophils Relative % 91 %   Neutro Abs 4.5 1.7 - 7.7 K/uL   Lymphocytes Relative 7 %   Lymphs Abs 0.4 (L) 0.7 - 4.0 K/uL   Monocytes Relative 2 %   Monocytes Absolute 0.1 0.1 - 1.0 K/uL   Eosinophils Relative 0 %   Eosinophils Absolute 0.0 0.0 - 0.7 K/uL   Basophils Relative 0 %   Basophils Absolute 0.0 0.0 - 0.1 K/uL    Comment: Performed at [facility], [address], [place], [place] [address]  Magnesium     Status: None   Collection Time: 08/10/17  5:00 AM  Result Value Ref Range   Magnesium 1.8 1.7 - 2.4 mg/dL    Comment: Performed at [facility], [address], [place],  [address]    ABGS Recent Labs    08/09/17 0830  PHART 7.320*  PO2ART 74.4*  HCO3 25.4   CULTURES Recent Results (from the past  240 hour(s))  Blood Culture (routine x 2)     Status: None (Preliminary result)   Collection Time: 08/07/17  1:16 PM  Result Value Ref Range  Status   Specimen Description BLOOD RIGHT ARM DRAWN BY RN  Final   Special Requests   Final    BOTTLES DRAWN AEROBIC AND ANAEROBIC Blood Culture adequate volume   Culture   Final    NO GROWTH 3 DAYS Performed at [facility], [address], [place], [place] [address]    Report Status PENDING  Incomplete  Blood Culture (routine x 2)     Status: None (Preliminary result)   Collection Time: 08/07/17  1:16 PM  Result Value Ref Range Status   Specimen Description BLOOD RIGHT HAND DRAWN BY RN  Final   Special Requests   Final    BOTTLES DRAWN AEROBIC AND ANAEROBIC Blood Culture adequate volume   Culture   Final    NO GROWTH 3 DAYS Performed at [facility], [address], [place], [place] [address]    Report Status PENDING  Incomplete  MRSA PCR Screening     Status: None   Collection Time: 08/07/17  8:25 PM  Result Value Ref Range Status   MRSA by PCR NEGATIVE NEGATIVE Final    Comment:        The GeneXpert MRSA Assay (FDA approved for NASAL specimens only), is one component of a comprehensive MRSA colonization surveillance program. It is not intended to diagnose MRSA infection nor to guide or monitor treatment for MRSA infections. Performed at [facility], [address], [place], [place] [address]    Studies/Results: No results found.  Medications:  Prior to Admission:  Medications Prior to Admission  Medication Sig Dispense Refill Last Dose  . albuterol (PROVENTIL HFA;VENTOLIN HFA) 108 (90 Base) MCG/ACT inhaler Inhale 1-2 puffs into the lungs every 4 (four) hours as needed for wheezing or shortness of breath. 1 Inhaler 0 Taking  . albuterol (PROVENTIL) (2.5 MG/3ML) 0.083% nebulizer solution Take 3 mLs (2.5 mg total) by nebulization every 2 (two) hours as needed for wheezing or shortness of breath. 75 mL 12 Taking  . aspirin 81 MG tablet Take 81 mg by mouth daily.    08/06/2017 at Unknown time  . calcium carbonate (TUMS - DOSED IN MG ELEMENTAL CALCIUM) 500 MG chewable tablet  Chew 1 tablet by mouth daily.   08/06/2017 at Unknown time  . clonazePAM (KLONOPIN) 1 MG tablet Take 1 tablet (1 mg total) by mouth 3 (three) times daily as needed. 90 tablet 2 08/06/2017 at Unknown time  . doxazosin (CARDURA) 2 MG tablet Take 1 tablet (2 mg total) by mouth daily. 90 tablet 1 08/06/2017 at Unknown time  . gabapentin (NEURONTIN) 100 MG capsule Take 1 capsule (100 mg total) by mouth 2 (two) times daily. 60 capsule 3 08/07/2017 at Unknown time  . lisinopril (PRINIVIL,ZESTRIL) 20 MG tablet Take 1 tablet (20 mg total) by mouth daily. 90 tablet 1 08/06/2017 at Unknown time  . Multiple Vitamins-Iron (MULTIVITAMINS WITH IRON) TABS tablet Take 1 tablet by mouth daily.   08/06/2017 at Unknown time  . atorvastatin (LIPITOR) 10 MG tablet Take 1 tablet (10 mg total) by mouth daily. (Patient not taking: Reported on 08/07/2017) 90 tablet 1 Not Taking at Unknown time   Scheduled: . aspirin EC  81 mg Oral Daily  . chlorhexidine  15 mL Mouth Rinse BID  . diltiazem  120 mg Oral Daily  . doxazosin  2 mg Oral  Daily  . enoxaparin (LOVENOX) injection  40 mg Subcutaneous Q24H  . gabapentin  100 mg Oral BID  . ipratropium-albuterol  3 mL Nebulization Q4H WA  . mouth rinse  15 mL Mouth Rinse q12n4p  . methylPREDNISolone (SOLU-MEDROL) injection  40 mg Intravenous Q6H   Continuous: . azithromycin Stopped (08/09/17 1433)  . cefTRIAXone (ROCEPHIN)  IV Stopped (08/09/17 1334)   YBF:XOVANVBTYOMAY **OR** acetaminophen, albuterol, clonazePAM, haloperidol lactate, hydrALAZINE, ondansetron **OR** ondansetron (ZOFRAN) IV  Assesment: He has acute on chronic hypoxic and hypercapnic respiratory failure that initially required BiPAP.  He is better now on nasal cannula.  He is short of breath but his oxygen was off.  He has COPD exacerbation which is being appropriately treated  He has healthcare associated pneumonia on appropriate antibiotics Active Problems:   Anxiety state   Essential hypertension   COPD with emphysema  (HCC)   BPH (benign prostatic hyperplasia)   CKD (chronic kidney disease), stage III ([place])   Acute respiratory failure with hypoxia and hypercapnia (HCC)   PNA (pneumonia)   Community acquired pneumonia of right lower lobe of lung ([place])   Goals of care, counseling/discussion   Palliative care by specialist   DNR (do not resuscitate) discussion   Sundowning    Plan: Continue current treatments overall much better    LOS: 3 days   Mabry Santarelli L 08/10/2017, 10:56 AM

## 2017-08-10 NOTE — Progress Notes (Addendum)
PROGRESS NOTE  Steven [name]  TGP:[id number]  DOB: 1928/11/08  DOA: 08/07/2017 PCP: [name], [name]   Brief Admission Hx: 84 y.o. male with medical history significant of COPD, hypertension, chronic kidney disease stage III admitted with pneumonia, and acute on chronic respiratory failure.   MDM/Assessment & Plan:   1. Acute respiratory failure with hypoxia and hypercapnia. Now is off BiPAP.  He is doing better on nasal cannula.  Will continue to follow.  Likely precipitated by pneumonia in the setting of COPD.    2. Healthcare associated pneumonia. Continue antibiotics.  3. Hypernatremia - Resolved now.   4. Hyperkalemia - Resolved.    5. COPD with acute exacerbation.  Continue bronchodilators.  Added antibiotics and IV steroids. 6. AKI - resolved. 7. BPH.  Stable.  Continue outpatient regimen of Cardura.  8. Hypertension.  Will hold lisinopril for now due to renal dysfunction. 9. Dementia with behavioral disturbance - Pt having sundowning.  Ordered as needed lorazepam/haloperidol IV.  DVT prophylaxis: lovenox  Code Status: full code, spoke with daughter she is unsure of patient's wishes about life support, will speak with him about it and let us know Family Communication: I spoke with daughter on telephone, son came to visit today Disposition Plan: pending hospital course  Consults called:    Subjective: Pt has been much less confused overnight.  Did not require bipap overnight.   Objective: Vitals:   08/10/17 0200 08/10/17 0300 08/10/17 0400 08/10/17 0455  BP: (!) 130/41 (!) 113/39 (!) 107/31   Pulse: 97 73 69   Resp: (!) 26 19 20    Temp:    97.6 F (36.4 C)  TempSrc:    Oral  SpO2: 95% 100% 98%   Weight:    61.3 kg (135 lb 2.3 oz)  Height:        Intake/Output Summary (Last 24 hours) at 08/10/2017 3094 Last data filed at 08/10/2017 0456 Gross per 24 hour  Intake 1342.08 ml  Output 1075 ml  Net 267.08 ml   Filed Weights   08/08/17 0500 08/09/17 0300  08/10/17 0455  Weight: 61.3 kg (135 lb 2.3 oz) 62.7 kg (138 lb 3.7 oz) 61.3 kg (135 lb 2.3 oz)     REVIEW OF SYSTEMS  As per history otherwise all reviewed and reported negative  Exam:  General exam: Pt appears chronically ill.  NAD.  Respiratory system:  Markedly better air movement.    Cardiovascular system: normal s1,s2 sounds. Gastrointestinal system: Abdomen is nondistended, soft and nontender. Normal bowel sounds heard. Central nervous system: Alert and confused. No focal neurological deficits. Extremities: no cyanosis.  Data Reviewed: Basic Metabolic Panel: Recent Labs  Lab 08/06/17 1210 08/07/17 1316 08/08/17 0402 08/09/17 0753 08/10/17 0500  NA 143 142 147* 142 141  K 5.0 5.0 5.3* 4.3 4.1  CL 103 106 113* 105 104  CO2 27 30 28 29 30   GLUCOSE 132* 114* 94 122* 165*  BUN 34* 39* 32* 19 14  CREATININE 1.69* 1.62* 1.46* 1.09 1.19  CALCIUM 9.2 8.9 8.6* 8.7* 8.7*  MG  --   --   --   --  1.8   Liver Function Tests: Recent Labs  Lab 08/06/17 1210 08/07/17 1316  AST 24 22  ALT 27 29  ALKPHOS 73 70  BILITOT <0.2 0.3  PROT 6.5 7.3  ALBUMIN 3.1* 2.7*   No results for input(s): LIPASE, AMYLASE in the last 168 hours. No results for input(s): AMMONIA in the last 168 hours. CBC: Recent  Labs  Lab 08/06/17 1210 08/07/17 1316 08/08/17 0402 08/09/17 0753 08/10/17 0500  WBC 7.3 6.5 6.1 7.0 4.9  NEUTROABS 5.4 4.5  --  5.4 4.5  HGB 8.1* 8.3* 8.1* 8.2* 7.9*  HCT 26.0* 27.9* 28.5* 27.6* 26.3*  MCV 91 97.2 99.0 97.2 96.3  PLT 346 359 311 266 301   Cardiac Enzymes: Recent Labs  Lab 08/07/17 1316  TROPONINI <0.03   CBG (last 3)  No results for input(s): GLUCAP in the last 72 hours. Recent Results (from the past 240 hour(s))  Blood Culture (routine x 2)     Status: None (Preliminary result)   Collection Time: 08/07/17  1:16 PM  Result Value Ref Range Status   Specimen Description BLOOD RIGHT ARM DRAWN BY RN  Final   Special Requests   Final    BOTTLES DRAWN  AEROBIC AND ANAEROBIC Blood Culture adequate volume   Culture   Final    NO GROWTH 3 DAYS Performed at [facility], [address], [place], [place] [address]    Report Status PENDING  Incomplete  Blood Culture (routine x 2)     Status: None (Preliminary result)   Collection Time: 08/07/17  1:16 PM  Result Value Ref Range Status   Specimen Description BLOOD RIGHT HAND DRAWN BY RN  Final   Special Requests   Final    BOTTLES DRAWN AEROBIC AND ANAEROBIC Blood Culture adequate volume   Culture   Final    NO GROWTH 3 DAYS Performed at [facility], [address] [place], [place] [address]    Report Status PENDING  Incomplete  MRSA PCR Screening     Status: None   Collection Time: 08/07/17  8:25 PM  Result Value Ref Range Status   MRSA by PCR NEGATIVE NEGATIVE Final    Comment:        The GeneXpert MRSA Assay (FDA approved for NASAL specimens only), is one component of a comprehensive MRSA colonization surveillance program. It is not intended to diagnose MRSA infection nor to guide or monitor treatment for MRSA infections. Performed at [facility], [address] [place], [place] [address]      Studies: No results found. Scheduled Meds: . aspirin EC  81 mg Oral Daily  . chlorhexidine  15 mL Mouth Rinse BID  . diltiazem  120 mg Oral Daily  . doxazosin  2 mg Oral Daily  . enoxaparin (LOVENOX) injection  40 mg Subcutaneous Q24H  . gabapentin  100 mg Oral BID  . ipratropium-albuterol  3 mL Nebulization Q4H WA  . mouth rinse  15 mL Mouth Rinse q12n4p  . methylPREDNISolone (SOLU-MEDROL) injection  40 mg Intravenous Q6H   Continuous Infusions: . azithromycin Stopped (08/09/17 1433)  . cefTRIAXone (ROCEPHIN)  IV Stopped (08/09/17 1334)    Active Problems:   Anxiety state   Essential hypertension   COPD with emphysema (HCC)   BPH (benign prostatic hyperplasia)   CKD (chronic kidney disease), stage III (HCC)   Acute respiratory failure with hypoxia and hypercapnia  (HCC)   PNA (pneumonia)   Community acquired pneumonia of right lower lobe of lung ([place])   Goals of care, counseling/discussion   Palliative care by specialist   DNR (do not resuscitate) discussion   Sundowning    Irwin Brakeman, MD, FAAFP [facility] Pager [phone]  If 7PM-7AM, please contact night-coverage [email or web address] Password [email or web address] 08/10/2017, 7:02 AM    LOS: 3 days

## 2017-08-11 ENCOUNTER — Encounter (HOSPITAL_COMMUNITY): Payer: Self-pay | Admitting: Family Medicine

## 2017-08-11 LAB — CBC
HCT: 24.8 % — ABNORMAL LOW (ref 39.0–52.0)
Hemoglobin: 7.4 g/dL — ABNORMAL LOW (ref 13.0–17.0)
MCH: 28.5 pg (ref 26.0–34.0)
MCHC: 29.8 g/dL — ABNORMAL LOW (ref 30.0–36.0)
MCV: 95.4 fL (ref 78.0–100.0)
Platelets: 310 10*3/uL (ref 150–400)
RBC: 2.6 MIL/uL — ABNORMAL LOW (ref 4.22–5.81)
RDW: 14.8 % (ref 11.5–15.5)
WBC: 8 10*3/uL (ref 4.0–10.5)

## 2017-08-11 NOTE — Progress Notes (Signed)
PROGRESS NOTE  Steven [name]  ZJI:[id number]  DOB: 1928/09/23  DOA: 08/07/2017 PCP: [name], [name]   Brief Admission Hx: 84 y.o. male with medical history significant of COPD, hypertension, chronic kidney disease stage III admitted with pneumonia, and acute on chronic respiratory failure.   MDM/Assessment & Plan:   1. Acute respiratory failure with hypoxia and hypercapnia. Now is off BiPAP.  He is doing better on nasal cannula.  Will continue to follow.  Likely precipitated by pneumonia in the setting of COPD.    2. Healthcare associated pneumonia. Continue antibiotics.  3. Hypernatremia - Resolved now.   4. Hyperkalemia - Resolved.    5. COPD with acute exacerbation.  Continue bronchodilators.  Added antibiotics and IV steroids. Appreciate pulmonary team following along.  6. AKI - resolved. 7. BPH.  Stable.  Continue outpatient regimen of Cardura.  8. Hypertension.  Will hold lisinopril for now due to renal dysfunction. 9. Dementia with behavioral disturbance - Pt having sundowning.  Ordered as needed lorazepam/haloperidol IV. 10. Normocytic Anemia - Hg is trending down, will follow, hemoccult stools.   DVT prophylaxis: lovenox  Code Status: full code, spoke with daughter she is unsure of patient's wishes about life support, will speak with him about it and let us know Family Communication: I spoke with daughter on telephone, son came to visit today Disposition Plan: pending hospital course  Consults called:  pulmonary  Subjective: Pt complains of coughing and SOB and wheezing more this morning.    Objective: Vitals:   08/10/17 2115 08/11/17 0339 08/11/17 0604 08/11/17 0725  BP: (!) 139/111  (!) 156/74   Pulse: 74  90   Resp: 20  20   Temp: 98.3 F (36.8 C)  97.7 F (36.5 C)   TempSrc: Oral  Oral   SpO2: 99% 95% 96% 97%  Weight:      Height:        Intake/Output Summary (Last 24 hours) at 08/11/2017 1101 Last data filed at 08/11/2017 0900 Gross per 24 hour    Intake 2020 ml  Output 1000 ml  Net 1020 ml   Filed Weights   08/08/17 0500 08/09/17 0300 08/10/17 0455  Weight: 61.3 kg (135 lb 2.3 oz) 62.7 kg (138 lb 3.7 oz) 61.3 kg (135 lb 2.3 oz)     REVIEW OF SYSTEMS  As per history otherwise all reviewed and reported negative  Exam:  General exam: Pt appears chronically ill.  NAD. More talkative today.  Respiratory system:  Diffuse wheezing.     Cardiovascular system: normal s1,s2 sounds. Gastrointestinal system: Abdomen is nondistended, soft and nontender. Normal bowel sounds heard. Central nervous system: Alert and confused. No focal neurological deficits. Extremities: no cyanosis.  Data Reviewed: Basic Metabolic Panel: Recent Labs  Lab 08/06/17 1210 08/07/17 1316 08/08/17 0402 08/09/17 0753 08/10/17 0500  NA 143 142 147* 142 141  K 5.0 5.0 5.3* 4.3 4.1  CL 103 106 113* 105 104  CO2 27 30 28 29 30   GLUCOSE 132* 114* 94 122* 165*  BUN 34* 39* 32* 19 14  CREATININE 1.69* 1.62* 1.46* 1.09 1.19  CALCIUM 9.2 8.9 8.6* 8.7* 8.7*  MG  --   --   --   --  1.8   Liver Function Tests: Recent Labs  Lab 08/06/17 1210 08/07/17 1316  AST 24 22  ALT 27 29  ALKPHOS 73 70  BILITOT <0.2 0.3  PROT 6.5 7.3  ALBUMIN 3.1* 2.7*   No results for input(s): LIPASE, AMYLASE  in the last 168 hours. No results for input(s): AMMONIA in the last 168 hours. CBC: Recent Labs  Lab 08/06/17 1210 08/07/17 1316 08/08/17 0402 08/09/17 0753 08/10/17 0500 08/11/17 0601  WBC 7.3 6.5 6.1 7.0 4.9 8.0  NEUTROABS 5.4 4.5  --  5.4 4.5  --   HGB 8.1* 8.3* 8.1* 8.2* 7.9* 7.4*  HCT 26.0* 27.9* 28.5* 27.6* 26.3* 24.8*  MCV 91 97.2 99.0 97.2 96.3 95.4  PLT 346 359 311 266 301 310   Cardiac Enzymes: Recent Labs  Lab 08/07/17 1316  TROPONINI <0.03   CBG (last 3)  No results for input(s): GLUCAP in the last 72 hours. Recent Results (from the past 240 hour(s))  Blood Culture (routine x 2)     Status: None (Preliminary result)   Collection Time:  08/07/17  1:16 PM  Result Value Ref Range Status   Specimen Description BLOOD RIGHT ARM DRAWN BY RN  Final   Special Requests   Final    BOTTLES DRAWN AEROBIC AND ANAEROBIC Blood Culture adequate volume   Culture   Final    NO GROWTH 4 DAYS Performed at [facility], [address], [place], [place] [address]    Report Status PENDING  Incomplete  Blood Culture (routine x 2)     Status: None (Preliminary result)   Collection Time: 08/07/17  1:16 PM  Result Value Ref Range Status   Specimen Description BLOOD RIGHT HAND DRAWN BY RN  Final   Special Requests   Final    BOTTLES DRAWN AEROBIC AND ANAEROBIC Blood Culture adequate volume   Culture   Final    NO GROWTH 4 DAYS Performed at [facility], [address] [place], [place] [address]    Report Status PENDING  Incomplete  MRSA PCR Screening     Status: None   Collection Time: 08/07/17  8:25 PM  Result Value Ref Range Status   MRSA by PCR NEGATIVE NEGATIVE Final    Comment:        The GeneXpert MRSA Assay (FDA approved for NASAL specimens only), is one component of a comprehensive MRSA colonization surveillance program. It is not intended to diagnose MRSA infection nor to guide or monitor treatment for MRSA infections. Performed at [facility], [address] [place], [place] [address]      Studies: No results found. Scheduled Meds: . aspirin EC  81 mg Oral Daily  . chlorhexidine  15 mL Mouth Rinse BID  . diltiazem  120 mg Oral Daily  . doxazosin  2 mg Oral Daily  . enoxaparin (LOVENOX) injection  40 mg Subcutaneous Q24H  . gabapentin  100 mg Oral BID  . ipratropium-albuterol  3 mL Nebulization Q4H WA  . mouth rinse  15 mL Mouth Rinse q12n4p  . methylPREDNISolone (SOLU-MEDROL) injection  40 mg Intravenous Q6H   Continuous Infusions: . azithromycin Stopped (08/10/17 1454)  . cefTRIAXone (ROCEPHIN)  IV Stopped (08/10/17 1355)    Active Problems:   Anxiety state   Essential hypertension   COPD with emphysema  (HCC)   BPH (benign prostatic hyperplasia)   CKD (chronic kidney disease), stage III (HCC)   Acute respiratory failure with hypoxia and hypercapnia (HCC)   PNA (pneumonia)   Community acquired pneumonia of right lower lobe of lung ([place])   Goals of care, counseling/discussion   Palliative care by specialist   DNR (do not resuscitate) discussion   Sundowning    Irwin Brakeman, MD, FAAFP [facility] Pager [phone]  If  7PM-7AM, please contact night-coverage [email or web address] Password [email or web address] 08/11/2017, 11:01 AM    LOS: 4 days

## 2017-08-11 NOTE — Progress Notes (Signed)
Subjective: He says he still feels short of breath.  He is coughing a little bit.  Objective: Vital signs in last 24 hours: Temp:  [97.7 F (36.5 C)-98.3 F (36.8 C)] 97.7 F (36.5 C) (07/07 0604) Pulse Rate:  [74-90] 90 (07/07 0604) Resp:  [18-20] 20 (07/07 0604) BP: (139-156)/(74-111) 156/74 (07/07 0604) SpO2:  [82 %-99 %] 97 % (07/07 0725) Weight change:  Last BM Date: 08/09/17  Intake/Output from previous day: 07/06 0701 - 07/07 0700 In: 1540 [P.O.:840; IV Piggyback:700] Out: 1000 [Urine:1000]  PHYSICAL EXAM General appearance: alert, cooperative and Very thin Resp: rhonchi bilaterally Cardio: regular rate and rhythm, S1, S2 normal, no murmur, click, rub or gallop GI: soft, non-tender; bowel sounds normal; no masses,  no organomegaly Extremities: extremities normal, atraumatic, no cyanosis or edema  Lab Results:  Results for orders placed or performed during the hospital encounter of 08/07/17 (from the past 48 hour(s))  Basic metabolic panel     Status: Abnormal   Collection Time: 08/10/17  5:00 AM  Result Value Ref Range   Sodium 141 135 - 145 mmol/L   Potassium 4.1 3.5 - 5.1 mmol/L   Chloride 104 98 - 111 mmol/L    Comment: Please note change in reference range.   CO2 30 22 - 32 mmol/L   Glucose, Bld 165 (H) 70 - 99 mg/dL    Comment: Please note change in reference range.   BUN 14 8 - 23 mg/dL    Comment: Please note change in reference range.   Creatinine, Ser 1.19 0.61 - 1.24 mg/dL   Calcium 8.7 (L) 8.9 - 10.3 mg/dL   GFR calc non Af Amer 53 (L) >60 mL/min   GFR calc Af Amer >60 >60 mL/min    Comment: (NOTE) The eGFR has been calculated using the CKD EPI equation. This calculation has not been validated in all clinical situations. eGFR's persistently <60 mL/min signify possible Chronic Kidney Disease.    Anion gap 7 5 - 15    Comment: Performed at [facility], [address] [place], [place] [address]  CBC with Differential/Platelet     Status: Abnormal    Collection Time: 08/10/17  5:00 AM  Result Value Ref Range   WBC 4.9 4.0 - 10.5 K/uL   RBC 2.73 (L) 4.22 - 5.81 MIL/uL   Hemoglobin 7.9 (L) 13.0 - 17.0 g/dL   HCT 26.3 (L) 39.0 - 52.0 %   MCV 96.3 78.0 - 100.0 fL   MCH 28.9 26.0 - 34.0 pg   MCHC 30.0 30.0 - 36.0 g/dL   RDW 14.7 11.5 - 15.5 %   Platelets 301 150 - 400 K/uL   Neutrophils Relative % 91 %   Neutro Abs 4.5 1.7 - 7.7 K/uL   Lymphocytes Relative 7 %   Lymphs Abs 0.4 (L) 0.7 - 4.0 K/uL   Monocytes Relative 2 %   Monocytes Absolute 0.1 0.1 - 1.0 K/uL   Eosinophils Relative 0 %   Eosinophils Absolute 0.0 0.0 - 0.7 K/uL   Basophils Relative 0 %   Basophils Absolute 0.0 0.0 - 0.1 K/uL    Comment: Performed at [facility], [address] [place], [place] [address]  Magnesium     Status: None   Collection Time: 08/10/17  5:00 AM  Result Value Ref Range   Magnesium 1.8 1.7 - 2.4 mg/dL    Comment: Performed at [facility], [address] [place], [place] [address]  CBC     Status: Abnormal  Collection Time: 08/11/17  6:01 AM  Result Value Ref Range   WBC 8.0 4.0 - 10.5 K/uL   RBC 2.60 (L) 4.22 - 5.81 MIL/uL   Hemoglobin 7.4 (L) 13.0 - 17.0 g/dL   HCT 24.8 (L) 39.0 - 52.0 %   MCV 95.4 78.0 - 100.0 fL   MCH 28.5 26.0 - 34.0 pg   MCHC 29.8 (L) 30.0 - 36.0 g/dL   RDW 14.8 11.5 - 15.5 %   Platelets 310 150 - 400 K/uL    Comment: Performed at [facility], [address] [place], [place] [address]    ABGS Recent Labs    08/09/17 0830  PHART 7.320*  PO2ART 74.4*  HCO3 25.4   CULTURES Recent Results (from the past 240 hour(s))  Blood Culture (routine x 2)     Status: None (Preliminary result)   Collection Time: 08/07/17  1:16 PM  Result Value Ref Range Status   Specimen Description BLOOD RIGHT ARM DRAWN BY RN  Final   Special Requests   Final    BOTTLES DRAWN AEROBIC AND ANAEROBIC Blood Culture adequate volume   Culture   Final    NO GROWTH 4 DAYS Performed at [facility], [address], [place],  [place] [address]    Report Status PENDING  Incomplete  Blood Culture (routine x 2)     Status: None (Preliminary result)   Collection Time: 08/07/17  1:16 PM  Result Value Ref Range Status   Specimen Description BLOOD RIGHT HAND DRAWN BY RN  Final   Special Requests   Final    BOTTLES DRAWN AEROBIC AND ANAEROBIC Blood Culture adequate volume   Culture   Final    NO GROWTH 4 DAYS Performed at [facility], [address] [place], [place] [address]    Report Status PENDING  Incomplete  MRSA PCR Screening     Status: None   Collection Time: 08/07/17  8:25 PM  Result Value Ref Range Status   MRSA by PCR NEGATIVE NEGATIVE Final    Comment:        The GeneXpert MRSA Assay (FDA approved for NASAL specimens only), is one component of a comprehensive MRSA colonization surveillance program. It is not intended to diagnose MRSA infection nor to guide or monitor treatment for MRSA infections. Performed at [facility], [address] [place], [place] [address]    Studies/Results: No results found.  Medications:  Prior to Admission:  Medications Prior to Admission  Medication Sig Dispense Refill Last Dose  . albuterol (PROVENTIL HFA;VENTOLIN HFA) 108 (90 Base) MCG/ACT inhaler Inhale 1-2 puffs into the lungs every 4 (four) hours as needed for wheezing or shortness of breath. 1 Inhaler 0 Taking  . albuterol (PROVENTIL) (2.5 MG/3ML) 0.083% nebulizer solution Take 3 mLs (2.5 mg total) by nebulization every 2 (two) hours as needed for wheezing or shortness of breath. 75 mL 12 Taking  . aspirin 81 MG tablet Take 81 mg by mouth daily.    08/06/2017 at Unknown time  . calcium carbonate (TUMS - DOSED IN MG ELEMENTAL CALCIUM) 500 MG chewable tablet Chew 1 tablet by mouth daily.   08/06/2017 at Unknown time  . clonazePAM (KLONOPIN) 1 MG tablet Take 1 tablet (1 mg total) by mouth 3 (three) times daily as needed. 90 tablet 2 08/06/2017 at Unknown time  . doxazosin (CARDURA) 2 MG tablet Take 1 tablet (2 mg total)  by mouth daily. 90 tablet 1 08/06/2017 at Unknown time  . gabapentin (NEURONTIN) 100  MG capsule Take 1 capsule (100 mg total) by mouth 2 (two) times daily. 60 capsule 3 08/07/2017 at Unknown time  . lisinopril (PRINIVIL,ZESTRIL) 20 MG tablet Take 1 tablet (20 mg total) by mouth daily. 90 tablet 1 08/06/2017 at Unknown time  . Multiple Vitamins-Iron (MULTIVITAMINS WITH IRON) TABS tablet Take 1 tablet by mouth daily.   08/06/2017 at Unknown time  . atorvastatin (LIPITOR) 10 MG tablet Take 1 tablet (10 mg total) by mouth daily. (Patient not taking: Reported on 08/07/2017) 90 tablet 1 Not Taking at Unknown time   Scheduled: . aspirin EC  81 mg Oral Daily  . chlorhexidine  15 mL Mouth Rinse BID  . diltiazem  120 mg Oral Daily  . doxazosin  2 mg Oral Daily  . enoxaparin (LOVENOX) injection  40 mg Subcutaneous Q24H  . gabapentin  100 mg Oral BID  . ipratropium-albuterol  3 mL Nebulization Q4H WA  . mouth rinse  15 mL Mouth Rinse q12n4p  . methylPREDNISolone (SOLU-MEDROL) injection  40 mg Intravenous Q6H   Continuous: . azithromycin Stopped (08/10/17 1454)  . cefTRIAXone (ROCEPHIN)  IV Stopped (08/10/17 1355)   YOV:[id number] **OR** acetaminophen, albuterol, clonazePAM, haloperidol lactate, hydrALAZINE, ondansetron **OR** ondansetron (ZOFRAN) IV  Assesment: He was admitted with community-acquired pneumonia and acute hypoxic and hypercapnic respiratory failure.  He is improving.  He required BiPAP initially but now is on nasal cannula.  He has COPD at baseline and has COPD exacerbation.  He is still short of breath. Active Problems:   Anxiety state   Essential hypertension   COPD with emphysema (HCC)   BPH (benign prostatic hyperplasia)   CKD (chronic kidney disease), stage III (HCC)   Acute respiratory failure with hypoxia and hypercapnia (HCC)   PNA (pneumonia)   Community acquired pneumonia of right lower lobe of lung ([place])   Goals of care, counseling/discussion   Palliative care by specialist    DNR (do not resuscitate) discussion   Sundowning    Plan: Continue treatments.  He is on steroids bronchodilators and antibiotics which is appropriate    LOS: 4 days   Dustina Scoggin L 08/11/2017, 10:32 AM

## 2017-08-12 LAB — CULTURE, BLOOD (ROUTINE X 2)
Culture: NO GROWTH
Culture: NO GROWTH
Special Requests: ADEQUATE
Special Requests: ADEQUATE

## 2017-08-12 MED ORDER — POLYSACCHARIDE IRON COMPLEX 150 MG PO CAPS
150.0000 mg | ORAL_CAPSULE | Freq: Every day | ORAL | Status: DC
  Administered 2017-08-12 – 2017-08-15 (×4): 150 mg via ORAL
  Filled 2017-08-12 (×4): qty 1

## 2017-08-12 MED ORDER — METHYLPREDNISOLONE SODIUM SUCC 40 MG IJ SOLR
40.0000 mg | Freq: Two times a day (BID) | INTRAMUSCULAR | Status: DC
  Administered 2017-08-12 – 2017-08-14 (×5): 40 mg via INTRAVENOUS
  Filled 2017-08-12 (×5): qty 1

## 2017-08-12 NOTE — Progress Notes (Signed)
Pt sat in chair approx. 120 minutes. Tolerated fair. Will continue to monitor.

## 2017-08-12 NOTE — Progress Notes (Signed)
Subjective: He says he feels okay.  He is still somewhat short of breath.  He is coughing mostly nonproductively.  Objective: Vital signs in last 24 hours: Temp:  [97.5 F (36.4 C)-98.1 F (36.7 C)] 97.8 F (36.6 C) (07/08 0528) Pulse Rate:  [79-97] 97 (07/08 0528) Resp:  [17-22] 20 (07/08 0528) BP: (109-165)/(45-70) 165/65 (07/08 0528) SpO2:  [92 %-98 %] 93 % (07/08 0750) Weight change:  Last BM Date: 08/09/17  Intake/Output from previous day: 07/07 0701 - 07/08 0700 In: 1810 [P.O.:1810] Out: 1700 [Urine:1700]  PHYSICAL EXAM General appearance: alert, cooperative and Very thin Resp: rhonchi bilaterally Cardio: regular rate and rhythm, S1, S2 normal, no murmur, click, rub or gallop GI: soft, non-tender; bowel sounds normal; no masses,  no organomegaly Extremities: extremities normal, atraumatic, no cyanosis or edema  Lab Results:  Results for orders placed or performed during the hospital encounter of 08/07/17 (from the past 48 hour(s))  CBC     Status: Abnormal   Collection Time: 08/11/17  6:01 AM  Result Value Ref Range   WBC 8.0 4.0 - 10.5 K/uL   RBC 2.60 (L) 4.22 - 5.81 MIL/uL   Hemoglobin 7.4 (L) 13.0 - 17.0 g/dL   HCT 24.8 (L) 39.0 - 52.0 %   MCV 95.4 78.0 - 100.0 fL   MCH 28.5 26.0 - 34.0 pg   MCHC 29.8 (L) 30.0 - 36.0 g/dL   RDW 14.8 11.5 - 15.5 %   Platelets 310 150 - 400 K/uL    Comment: Performed at [facility], [address], [place], [place] [address]    ABGS No results for input(s): PHART, PO2ART, TCO2, HCO3 in the last 72 hours.  Invalid input(s): PCO2 CULTURES Recent Results (from the past 240 hour(s))  Blood Culture (routine x 2)     Status: None   Collection Time: 08/07/17  1:16 PM  Result Value Ref Range Status   Specimen Description BLOOD RIGHT ARM DRAWN BY RN  Final   Special Requests   Final    BOTTLES DRAWN AEROBIC AND ANAEROBIC Blood Culture adequate volume   Culture   Final    NO GROWTH 5 DAYS Performed at [facility], [address] [place], [place] [address]    Report Status 08/12/2017 FINAL  Final  Blood Culture (routine x 2)     Status: None   Collection Time: 08/07/17  1:16 PM  Result Value Ref Range Status   Specimen Description BLOOD RIGHT HAND DRAWN BY RN  Final   Special Requests   Final    BOTTLES DRAWN AEROBIC AND ANAEROBIC Blood Culture adequate volume   Culture   Final    NO GROWTH 5 DAYS Performed at [facility], [address] [place], [place] [address]    Report Status 08/12/2017 FINAL  Final  MRSA PCR Screening     Status: None   Collection Time: 08/07/17  8:25 PM  Result Value Ref Range Status   MRSA by PCR NEGATIVE NEGATIVE Final    Comment:        The GeneXpert MRSA Assay (FDA approved for NASAL specimens only), is one component of a comprehensive MRSA colonization surveillance program. It is not intended to diagnose MRSA infection nor to guide or monitor treatment for MRSA infections. Performed at [facility], [address], [place],  [address]    Studies/Results: No results found.  Medications:  Prior to Admission:  Medications Prior to Admission  Medication Sig Dispense Refill Last Dose  .  albuterol (PROVENTIL HFA;VENTOLIN HFA) 108 (90 Base) MCG/ACT inhaler Inhale 1-2 puffs into the lungs every 4 (four) hours as needed for wheezing or shortness of breath. 1 Inhaler 0 Taking  . albuterol (PROVENTIL) (2.5 MG/3ML) 0.083% nebulizer solution Take 3 mLs (2.5 mg total) by nebulization every 2 (two) hours as needed for wheezing or shortness of breath. 75 mL 12 Taking  . aspirin 81 MG tablet Take 81 mg by mouth daily.    08/06/2017 at Unknown time  . calcium carbonate (TUMS - DOSED IN MG ELEMENTAL CALCIUM) 500 MG chewable tablet Chew 1 tablet by mouth daily.   08/06/2017 at Unknown time  . clonazePAM (KLONOPIN) 1 MG tablet Take 1 tablet (1 mg total) by mouth 3 (three) times daily as needed. 90 tablet 2 08/06/2017 at Unknown time  . doxazosin (CARDURA) 2 MG tablet Take 1 tablet (2 mg  total) by mouth daily. 90 tablet 1 08/06/2017 at Unknown time  . gabapentin (NEURONTIN) 100 MG capsule Take 1 capsule (100 mg total) by mouth 2 (two) times daily. 60 capsule 3 08/07/2017 at Unknown time  . lisinopril (PRINIVIL,ZESTRIL) 20 MG tablet Take 1 tablet (20 mg total) by mouth daily. 90 tablet 1 08/06/2017 at Unknown time  . Multiple Vitamins-Iron (MULTIVITAMINS WITH IRON) TABS tablet Take 1 tablet by mouth daily.   08/06/2017 at Unknown time  . atorvastatin (LIPITOR) 10 MG tablet Take 1 tablet (10 mg total) by mouth daily. (Patient not taking: Reported on 08/07/2017) 90 tablet 1 Not Taking at Unknown time   Scheduled: . aspirin EC  81 mg Oral Daily  . chlorhexidine  15 mL Mouth Rinse BID  . diltiazem  120 mg Oral Daily  . doxazosin  2 mg Oral Daily  . enoxaparin (LOVENOX) injection  40 mg Subcutaneous Q24H  . gabapentin  100 mg Oral BID  . ipratropium-albuterol  3 mL Nebulization Q4H WA  . mouth rinse  15 mL Mouth Rinse q12n4p  . methylPREDNISolone (SOLU-MEDROL) injection  40 mg Intravenous Q12H   Continuous: . azithromycin Stopped (08/11/17 1312)  . cefTRIAXone (ROCEPHIN)  IV Stopped (08/11/17 1626)   WGN:[id number] **OR** acetaminophen, albuterol, clonazePAM, haloperidol lactate, hydrALAZINE, ondansetron **OR** ondansetron (ZOFRAN) IV  Assesment: He was admitted with community-acquired pneumonia and acute hypoxic and hypercapnic respiratory failure from that.  He required BiPAP initially but is now on nasal cannula.  He remains on IV antibiotics.  He had multiple electrolyte abnormalities that have resolved  He has COPD exacerbation on steroids and bronchodilators  He has dementia but seems to be doing okay right now  He is anemic presumably related to his chronic kidney disease but his hemoglobin level has been trending down Active Problems:   Anxiety state   Essential hypertension   COPD with emphysema (HCC)   BPH (benign prostatic hyperplasia)   CKD (chronic kidney  disease), stage III ([place])   Acute respiratory failure with hypoxia and hypercapnia (HCC)   PNA (pneumonia)   Community acquired pneumonia of right lower lobe of lung ([place])   Goals of care, counseling/discussion   Palliative care by specialist   DNR (do not resuscitate) discussion   Sundowning    Plan: Continue treatments.  He seems to be slowly improving.     LOS: 5 days   Prisca Gearing L 08/12/2017, 8:33 AM

## 2017-08-12 NOTE — Evaluation (Signed)
Physical Therapy Evaluation Patient Details Name: Steven [name] MRN: [id number] DOB: October 29, 1928 Today's Date: 08/12/2017   History of Present Illness  : 84 y.o. male  with past medical history of high blood pressure and cholesterol, history of heart attack in 1999, lung cancer in 2013, 146-pack-year smoking history quit approximately 10 years ago admitted on 08/07/2017 with acute respiratory failure with hypoxia and hypercapnia, healthcare associated pneumonia  Clinical Impression  Steven [name] is not oriented to time or place.  He is SOB and becomes more so upon exertion.  At this time his functional mobility is limited to a point where he is not safe to go home.  I recommend a short stay of skilled nursing home facility in order for the patient to improve his endurance and functional mobility to prevent falling at home.     Follow Up Recommendations SNF    Equipment Recommendations  None recommended by PT    Recommendations for Other Services       Precautions / Restrictions Precautions Precautions: Fall Restrictions Weight Bearing Restrictions: No      Mobility  Bed Mobility Overal bed mobility: Needs Assistance Bed Mobility: Sidelying to Sit;Sit to Supine   Sidelying to sit: Min assist   Sit to supine: Supervision      Transfers                 General transfer comment: Pt able to move self to head of bed but significantly SOB.   Pt refused to transfer or ambulate due to not feeling well            Pertinent Vitals/Pain  no pain verbalized       Prior Function   Pt states that he was ambulating without oxygen or assistive device.  There is no Family available to confirm this.                  Extremity/Trunk Assessment        Lower Extremity Assessment Lower Extremity Assessment: Overall WFL for tasks assessed       Communication      Cognition Arousal/Alertness: Awake/alert Behavior During Therapy: WFL for tasks  assessed/performed Overall Cognitive Status: No family/caregiver present to determine baseline cognitive functioning(Impaired)                                           Exercises General Exercises - Lower Extremity Ankle Circles/Pumps: Both;10 reps Quad Sets: Both;10 reps Gluteal Sets: Both;10 reps Long Arc Quad: Both;5 reps   Assessment/Plan    PT Assessment Patient needs continued PT services  PT Problem List Decreased balance;Decreased mobility;Decreased activity tolerance       PT Treatment Interventions Therapeutic exercise;Therapeutic activities;Functional mobility training;Gait training    PT Goals (Current goals can be found in the Care Plan section)  Acute Rehab PT Goals Patient Stated Goal: To be able to walk again PT Goal Formulation: With patient Time For Goal Achievement: 08/16/17 Potential to Achieve Goals: Good    Frequency Min 5X/week   Barriers to discharge           AM-PAC PT "6 Clicks" Daily Activity  Outcome Measure                  End of Session   Activity Tolerance: Patient limited by fatigue;Treatment limited secondary to medical complications (Comment)(SOB upon exertion ) Patient left:  in bed;with call bell/phone within reach;with bed alarm set   PT Visit Diagnosis: Unsteadiness on feet (R26.81);Difficulty in walking, not elsewhere classified (R26.2)    Time: 1225-8346 PT Time Calculation (min) (ACUTE ONLY): 22 min   Charges:   PT Evaluation $PT Eval Moderate Complexity: 1 Mod     PT G CodesRayetta [name], PT CLT [phone] 08/12/2017, 4:22 PM

## 2017-08-12 NOTE — Progress Notes (Signed)
PROGRESS NOTE  Steven [name]  SVX:[id number]  DOB: 03/31/1928  DOA: 08/07/2017 PCP: [name], [name]   Brief Admission Hx: 84 y.o. male with medical history significant of COPD, hypertension, chronic kidney disease stage III admitted with pneumonia, and acute on chronic respiratory failure.   MDM/Assessment & Plan:   1. Acute respiratory failure with hypoxia and hypercapnia. He is off BiPAP.  He is on nasal cannula.  Will continue to follow.  Likely precipitated by pneumonia in the setting of COPD.    2. Healthcare associated pneumonia. Continue antibiotics.  3. Hypernatremia - Resolved now.   4. Hyperkalemia - Resolved.    5. COPD with acute exacerbation.  Continue bronchodilators.  Continue antibiotics and IV steroids. Appreciate pulmonary team following along. He is slowly improving.  6. AKI - resolved. 7. BPH.  Stable.  Continue outpatient regimen of Cardura.  8. Hypertension.  Will hold lisinopril for now due to renal dysfunction. 9. Dementia with behavioral disturbance - Pt having sundowning.  Ordered as needed lorazepam/haloperidol IV. 10. Normocytic Anemia - Hg is trending down and I am not certain of the cause, will hemoccult stools, so far they have been negative, follow.   Start iron supplement.   DVT prophylaxis: lovenox  Code Status: full code, spoke with daughter she is unsure of patient's wishes about life support, will speak with him about it and let us know Family Communication: I spoke with daughter on telephone, son came to visit today Disposition Plan: pending hospital course  Consults called:  pulmonary  Subjective: Pt SOB this morning and wheezing.  He is hungry and wants to eat.    Objective: Vitals:   08/11/17 2013 08/11/17 2144 08/12/17 0528 08/12/17 0750  BP:  (!) 142/70 (!) 165/65   Pulse:  92 97   Resp:  17 20   Temp:  (!) 97.5 F (36.4 C) 97.8 F (36.6 C)   TempSrc:  Oral Oral   SpO2: 98% 92% 94% 93%  Weight:      Height:         Intake/Output Summary (Last 24 hours) at 08/12/2017 0905 Last data filed at 08/12/2017 2330 Gross per 24 hour  Intake 1570 ml  Output 1700 ml  Net -130 ml   Filed Weights   08/08/17 0500 08/09/17 0300 08/10/17 0455  Weight: 61.3 kg (135 lb 2.3 oz) 62.7 kg (138 lb 3.7 oz) 61.3 kg (135 lb 2.3 oz)   REVIEW OF SYSTEMS  As per history otherwise all reviewed and reported negative  Exam:  General exam: Pt appears chronically ill. NAD.  Sitting up in bed.   Respiratory system:  Diffuse wheezing.     Cardiovascular system: normal s1,s2 sounds. Gastrointestinal system: Abdomen is nondistended, soft and nontender. Normal bowel sounds heard. Central nervous system: Alert and confused. No focal neurological deficits. Extremities: no cyanosis.  Data Reviewed: Basic Metabolic Panel: Recent Labs  Lab 08/06/17 1210 08/07/17 1316 08/08/17 0402 08/09/17 0753 08/10/17 0500  NA 143 142 147* 142 141  K 5.0 5.0 5.3* 4.3 4.1  CL 103 106 113* 105 104  CO2 27 30 28 29 30   GLUCOSE 132* 114* 94 122* 165*  BUN 34* 39* 32* 19 14  CREATININE 1.69* 1.62* 1.46* 1.09 1.19  CALCIUM 9.2 8.9 8.6* 8.7* 8.7*  MG  --   --   --   --  1.8   Liver Function Tests: Recent Labs  Lab 08/06/17 1210 08/07/17 1316  AST 24 22  ALT 27 29  ALKPHOS 73 70  BILITOT <0.2 0.3  PROT 6.5 7.3  ALBUMIN 3.1* 2.7*   No results for input(s): LIPASE, AMYLASE in the last 168 hours. No results for input(s): AMMONIA in the last 168 hours. CBC: Recent Labs  Lab 08/06/17 1210 08/07/17 1316 08/08/17 0402 08/09/17 0753 08/10/17 0500 08/11/17 0601  WBC 7.3 6.5 6.1 7.0 4.9 8.0  NEUTROABS 5.4 4.5  --  5.4 4.5  --   HGB 8.1* 8.3* 8.1* 8.2* 7.9* 7.4*  HCT 26.0* 27.9* 28.5* 27.6* 26.3* 24.8*  MCV 91 97.2 99.0 97.2 96.3 95.4  PLT 346 359 311 266 301 310   Cardiac Enzymes: Recent Labs  Lab 08/07/17 1316  TROPONINI <0.03   CBG (last 3)  No results for input(s): GLUCAP in the last 72 hours. Recent Results (from the  past 240 hour(s))  Blood Culture (routine x 2)     Status: None   Collection Time: 08/07/17  1:16 PM  Result Value Ref Range Status   Specimen Description BLOOD RIGHT ARM DRAWN BY RN  Final   Special Requests   Final    BOTTLES DRAWN AEROBIC AND ANAEROBIC Blood Culture adequate volume   Culture   Final    NO GROWTH 5 DAYS Performed at [facility], [address] [place], [place] [address]    Report Status 08/12/2017 FINAL  Final  Blood Culture (routine x 2)     Status: None   Collection Time: 08/07/17  1:16 PM  Result Value Ref Range Status   Specimen Description BLOOD RIGHT HAND DRAWN BY RN  Final   Special Requests   Final    BOTTLES DRAWN AEROBIC AND ANAEROBIC Blood Culture adequate volume   Culture   Final    NO GROWTH 5 DAYS Performed at [facility], [address], [place], [place] [address]    Report Status 08/12/2017 FINAL  Final  MRSA PCR Screening     Status: None   Collection Time: 08/07/17  8:25 PM  Result Value Ref Range Status   MRSA by PCR NEGATIVE NEGATIVE Final    Comment:        The GeneXpert MRSA Assay (FDA approved for NASAL specimens only), is one component of a comprehensive MRSA colonization surveillance program. It is not intended to diagnose MRSA infection nor to guide or monitor treatment for MRSA infections. Performed at [facility], [address], [place], [place] [address]      Studies: No results found. Scheduled Meds: . aspirin EC  81 mg Oral Daily  . chlorhexidine  15 mL Mouth Rinse BID  . diltiazem  120 mg Oral Daily  . doxazosin  2 mg Oral Daily  . enoxaparin (LOVENOX) injection  40 mg Subcutaneous Q24H  . gabapentin  100 mg Oral BID  . ipratropium-albuterol  3 mL Nebulization Q4H WA  . mouth rinse  15 mL Mouth Rinse q12n4p  . methylPREDNISolone (SOLU-MEDROL) injection  40 mg Intravenous Q12H   Continuous Infusions: . azithromycin Stopped (08/11/17 1312)  . cefTRIAXone (ROCEPHIN)  IV Stopped (08/11/17 1626)    Active  Problems:   Anxiety state   Essential hypertension   COPD with emphysema (HCC)   BPH (benign prostatic hyperplasia)   CKD (chronic kidney disease), stage III (HCC)   Acute respiratory failure with hypoxia and hypercapnia (HCC)   PNA (pneumonia)   Community acquired pneumonia of right lower lobe of lung ([place])   Goals of care, counseling/discussion   Palliative care by specialist   DNR (  do not resuscitate) discussion   Sundowning  Irwin Brakeman, MD, FAAFP [facility] Pager [phone]  If 7PM-7AM, please contact night-coverage [email or web address] Password [email or web address] 08/12/2017, 9:05 AM    LOS: 5 days

## 2017-08-12 NOTE — Progress Notes (Addendum)
Palliative: Steven [name] is resting quietly in bed.  He greets me making and keeping eye contact as I enter the room.  He is alert, and oriented to person and situation but not time.  There is no family at bedside at this time.  I asked him how he is doing and he replies, "not good, not good at all".  We talked about where he will go when he leaves the hospital.  Steven [name] is open to rehab.  He shares that he believes his daughter and son do not want him to go to rehab, and I share that it is his choice.  He states that he prefers to go to [facility] which is near his home.  I answered patient questions related to rehab to the best of my abilities, advised that SW will continue conversation, and have referred to SW.   We talked about Steven [name]'s breathing, he is having some pursed lip breathing.  We again today briefly talked about CODE STATUS.  I share my worry that if he were intubated, he would have a hard time being taken off of the breathing machine.  Steven [name] agrees.    Call to daughter, Cristina Gong.  [name] states that she thinks rehab is a good choice.  She is agreeable to [facility], as it is close to their home.  [name] states that she quit her job to care for her mother who has dementia.  Thayer Headings shares that the family does not want Steven [name] to become a resident of a skilled nursing facility, but she sees that he is declining.  She also shares that Steven [name] can not walk, that he is having trouble walking out to his truck.  Share my surprise that Steven [name] is driving.  [name] states that he likes to go to [employer] to meet up with his friends at breakfast.  We both think he should not be driving.  [name] also endorses that Steven [name] is having trouble remembering, I advised that he may need 24 hour care soon.  [name] agrees, states brother does not help with care giving. We talk about code status, and my worry about intubation.  [name] states she would not want intubation, but they have not come  to any firm decisions. I encourage her to continue discussions at rehab, the social workers there will help to figure out what's next.  Conference with social worker related to plan of care/rehab. Conference with hospitalist related to plan of care/rehab. 25 minutes  Quinn Axe, NP Palliative Medicine Team Team Phone # [phone]

## 2017-08-12 NOTE — Evaluation (Signed)
Clinical/Bedside Swallow Evaluation Patient Details  Name: Steven [name] MRN: [id number] Date of Birth: August 11, 1928  Today's Date: 08/12/2017 Time: SLP Start Time (ACUTE ONLY): 36 SLP Stop Time (ACUTE ONLY): 1300 SLP Time Calculation (min) (ACUTE ONLY): 30 min  Past Medical History:  Past Medical History:  Diagnosis Date  . Anxiety   . Cancer [facility]) 2013   Lung - cancer free x3 years.   . Hyperlipidemia   . Hypertension   . Myocardial infarction ([place]) 1999   Past Surgical History:  Past Surgical History:  Procedure Laterality Date  . CARPAL TUNNEL RELEASE  1980's   right and left  . CHOLECYSTECTOMY    . COLONOSCOPY N/A 09/29/2014   Procedure: COLONOSCOPY;  Surgeon: Rogene Houston, MD;  [facility];  Service: Endoscopy;  Laterality: N/A;  . CORONARY ARTERY BYPASS GRAFT  1999   5 grafts  . FLEXIBLE BRONCHOSCOPY  11/07/2011   Procedure: FLEXIBLE BRONCHOSCOPY;  Surgeon: Gaye Pollack, MD;  Location: [place];  Service: Thoracic;  Laterality: N/A;  . HERNIA REPAIR  2725   umbilical hernia repair  . JOINT REPLACEMENT  2000   right shoulder rotator cuff repair   HPI:  84 y.o. male with medical history significant of COPD, hypertension, chronic kidney disease stage III admitted with pneumonia, and acute on chronic respiratory failure. BSE requested.    Assessment / Plan / Recommendation Clinical Impression  Clinical swallow evaluation completed in room with Pt sitting upright in chair. Lunch tray was mostly untouched except for green beans and some mashed potato. Oral motor examination reveals edentulous status and otherwise WNL. Vocal quality is marked with reduced vocal intensity and Pt with congested cough. Pt denies previous difficulties swallowing and states that he lives at home alone, but is unsure if he can continue to do so. No reports of previous PNA other than this current episode. Pt assessed with ice chips, thin via cup/straw, NTL, HTL, puree, and mechanical soft  textures. Pt with delayed congested cough post intake of thin liquids inconsistently. Pt with impaired mastication for solids meats, however able to clear with increased time. Recommend downgrading textures to D3/mech soft with fine chop meats and nectar-thick liquids, as Pt with no signs of aspiration with NTL. SLP will follow tomorrow for MBSS if indicated. Above to RN and Pt in agreement with plan of care. No family present at this time.  SLP Visit Diagnosis: Dysphagia, unspecified (R13.10)    Aspiration Risk  Mild aspiration risk    Diet Recommendation Dysphagia 3 (Mech soft);Nectar-thick liquid(fine chop meats)   Liquid Administration via: Cup Medication Administration: Whole meds with liquid Supervision: Patient able to self feed;Intermittent supervision to cue for compensatory strategies Compensations: Slow rate;Small sips/bites Postural Changes: Seated upright at 90 degrees;Remain upright for at least 30 minutes after po intake    Other  Recommendations Oral Care Recommendations: Oral care BID;Staff/trained caregiver to provide oral care Other Recommendations: Clarify dietary restrictions;Order thickener from pharmacy;Prohibited food (jello, ice cream, thin soups)   Follow up Recommendations (pending clinical course)      Frequency and Duration min 2x/week  1 week       Prognosis Prognosis for Safe Diet Advancement: Fair Barriers to Reach Goals: Cognitive deficits      Swallow Study   General Date of Onset: 08/07/17 HPI: 84 y.o. male with medical history significant of COPD, hypertension, chronic kidney disease stage III admitted with pneumonia, and acute on chronic respiratory failure. BSE requested.  Type of Study:  Bedside Swallow Evaluation Previous Swallow Assessment: None on record Diet Prior to this Study: Dysphagia 3 (soft);Thin liquids Temperature Spikes Noted: No Respiratory Status: Nasal cannula History of Recent Intubation: No Behavior/Cognition:  Alert;Cooperative;Pleasant mood Oral Cavity Assessment: Within Functional Limits Oral Care Completed by SLP: Yes Oral Cavity - Dentition: Edentulous Vision: Functional for self-feeding Self-Feeding Abilities: Able to feed self;Needs set up Patient Positioning: Upright in chair Baseline Vocal Quality: Low vocal intensity Volitional Cough: Strong;Congested Volitional Swallow: Able to elicit    Oral/Motor/Sensory Function Overall Oral Motor/Sensory Function: Within functional limits   Ice Chips Ice chips: Within functional limits Presentation: Spoon   Thin Liquid Thin Liquid: Impaired Presentation: Cup;Self Fed;Straw Pharyngeal  Phase Impairments: Multiple swallows;Cough - Delayed    Nectar Thick Nectar Thick Liquid: Within functional limits Presentation: Cup;Self Fed;Straw   Honey Thick Honey Thick Liquid: Within functional limits Presentation: Cup;Self fed   Puree Puree: Within functional limits Presentation: Self Fed;Spoon   Solid   GO  Thank you,  Genene Churn, CCC-SLP [phone]  Solid: Impaired Presentation: Spoon Oral Phase Impairments: Impaired mastication Oral Phase Functional Implications: Prolonged oral transit        PORTER,DABNEY 08/12/2017,1:09 PM

## 2017-08-13 ENCOUNTER — Inpatient Hospital Stay (HOSPITAL_COMMUNITY): Payer: Medicare HMO

## 2017-08-13 LAB — BASIC METABOLIC PANEL
Anion gap: 6 (ref 5–15)
BUN: 31 mg/dL — ABNORMAL HIGH (ref 8–23)
CO2: 32 mmol/L (ref 22–32)
Calcium: 9 mg/dL (ref 8.9–10.3)
Chloride: 99 mmol/L (ref 98–111)
Creatinine, Ser: 1.18 mg/dL (ref 0.61–1.24)
GFR calc Af Amer: >60 mL/min (ref >60)
GFR calc non Af Amer: 54 mL/min — ABNORMAL LOW (ref >60)
Glucose, Bld: 156 mg/dL — ABNORMAL HIGH (ref 70–99)
Potassium: 4.5 mmol/L (ref 3.5–5.1)
Sodium: 137 mmol/L (ref 135–145)

## 2017-08-13 LAB — CBC
HCT: 28.9 % — ABNORMAL LOW (ref 39.0–52.0)
Hemoglobin: 8.7 g/dL — ABNORMAL LOW (ref 13.0–17.0)
MCH: 28.5 pg (ref 26.0–34.0)
MCHC: 30.1 g/dL (ref 30.0–36.0)
MCV: 94.8 fL (ref 78.0–100.0)
Platelets: 368 10*3/uL (ref 150–400)
RBC: 3.05 MIL/uL — ABNORMAL LOW (ref 4.22–5.81)
RDW: 14.9 % (ref 11.5–15.5)
WBC: 9.4 10*3/uL (ref 4.0–10.5)

## 2017-08-13 MED ORDER — IPRATROPIUM-ALBUTEROL 0.5-2.5 (3) MG/3ML IN SOLN
3.0000 mL | Freq: Three times a day (TID) | RESPIRATORY_TRACT | Status: DC
  Administered 2017-08-13 – 2017-08-15 (×8): 3 mL via RESPIRATORY_TRACT
  Filled 2017-08-13 (×8): qty 3

## 2017-08-13 NOTE — Progress Notes (Signed)
Physical Therapy Treatment Patient Details Name: Steven [name] MRN: [id number] DOB: 19-Apr-1928 Today's Date: 08/13/2017    History of Present Illness Steven [name] is a 84 y.o. male with medical history significant of COPD, hypertension, chronic kidney disease stage III.  History is somewhat limited since patient is currently on BiPAP.  No family present.  Information is obtained from the medical record.  Patient has been feeling short of breath and coughing for the past 3 days.  He is seen his primary care physician yesterday and was diagnosed with pneumonia.  He was started on azithromycin.  Overnight, his shortness of breath has substantially gotten worse.  EMS was called and is brought to the hospital for evaluation.  He denies any chest pain.  No vomiting or diarrhea.    PT Comments    Patient demonstrates increased endurance/distance for gait training with slow labored movement, occasional scissoring of legs, VC's to step closer to RW and mostly limited due to c/o fatigue.  Patient tolerated sitting up in chair after therapy - RN notified.  Patient will benefit from continued physical therapy in hospital and recommended venue below to increase strength, balance, endurance for safe ADLs and gait.    Follow Up Recommendations  SNF;Supervision/Assistance - 24 hour     Equipment Recommendations  None recommended by PT    Recommendations for Other Services       Precautions / Restrictions Precautions Precautions: Fall Restrictions Weight Bearing Restrictions: No    Mobility  Bed Mobility Overal bed mobility: Needs Assistance Bed Mobility: Supine to Sit     Supine to sit: Supervision     General bed mobility comments: with head of bed raised  Transfers Overall transfer level: Needs assistance Equipment used: Rolling walker (2 wheeled) Transfers: Sit to/from [facility] Sit to Stand: Min assist Stand pivot transfers: Min assist;Mod assist       General  transfer comment: slow labored movement   Ambulation/Gait Ambulation/Gait assistance: Min assist;Mod assist Gait Distance (Feet): 55 Feet Assistive device: Rolling walker (2 wheeled) Gait Pattern/deviations: Decreased step length - right;Decreased step length - left;Decreased stride length;Scissoring Gait velocity: slow   General Gait Details: demonstrates slow labored cadence with occasional scissoring of legs, VC's to step closer to RW, limited secondary to c/o fatigue   Stairs             Wheelchair Mobility    Modified Rankin (Stroke Patients Only)       Balance Overall balance assessment: Needs assistance Sitting-balance support: Feet supported;Bilateral upper extremity supported Sitting balance-Leahy Scale: Fair Sitting balance - Comments: has to support self with BUE   Standing balance support: Bilateral upper extremity supported;During functional activity Standing balance-Leahy Scale: Fair                              Cognition Arousal/Alertness: Awake/alert Behavior During Therapy: WFL for tasks assessed/performed Overall Cognitive Status: No family/caregiver present to determine baseline cognitive functioning                                        Exercises General Exercises - Lower Extremity Long Arc Quad: Seated;AROM;Strengthening;Both;10 reps Hip Flexion/Marching: Seated;AROM;Strengthening;Both;10 reps Toe Raises: Seated;AROM;Strengthening;Both;10 reps Heel Raises: Seated;AROM;Strengthening;Both;10 reps    General Comments        Pertinent Vitals/Pain Pain Assessment: No/denies pain    Home  Living                      Prior Function            PT Goals (current goals can now be found in the care plan section) Acute Rehab PT Goals Patient Stated Goal: To be able to walk again PT Goal Formulation: With patient Time For Goal Achievement: 08/27/17 Potential to Achieve Goals: Good Progress towards PT  goals: Progressing toward goals    Frequency    Min 3X/week      PT Plan Current plan remains appropriate    Co-evaluation              AM-PAC PT "6 Clicks" Daily Activity  Outcome Measure  Difficulty turning over in bed (including adjusting bedclothes, sheets and blankets)?: None Difficulty moving from lying on back to sitting on the side of the bed? : A Little Difficulty sitting down on and standing up from a chair with arms (e.g., wheelchair, bedside commode, etc,.)?: A Little Help needed moving to and from a bed to chair (including a wheelchair)?: A Lot Help needed walking in hospital room?: A Lot Help needed climbing 3-5 steps with a railing? : A Lot 6 Click Score: 16    End of Session Equipment Utilized During Treatment: Gait belt Activity Tolerance: Patient tolerated treatment well Patient left: in chair;with call bell/phone within reach;with chair alarm set Nurse Communication: Mobility status;Other (comment)(RN aware that patient left up in chair) PT Visit Diagnosis: Unsteadiness on feet (R26.81);Other abnormalities of gait and mobility (R26.89);Muscle weakness (generalized) (M62.81)     Time: 4103-0131 PT Time Calculation (min) (ACUTE ONLY): 28 min  Charges:  $Therapeutic Exercise: 8-22 mins $Therapeutic Activity: 8-22 mins                    G Codes:       3:16 PM, 09-06-17 Lonell Grandchild, MPT Physical Therapist with [facility] 336 [phone] office [phone] mobile phone

## 2017-08-13 NOTE — Progress Notes (Signed)
Pt transported down for swallow study via transport staff.

## 2017-08-13 NOTE — Progress Notes (Signed)
PROGRESS NOTE  Steven [name]  HER:[id number]  DOB: 04/04/1928  DOA: 08/07/2017 PCP: [name], [name]   Brief Admission Hx: 84 y.o. male with medical history significant of COPD, hypertension, chronic kidney disease stage III admitted with pneumonia, and acute on chronic respiratory failure.   MDM/Assessment & Plan:   1. Acute respiratory failure with hypoxia and hypercapnia. He is off BiPAP.  He is on nasal cannula.  Will continue to follow.  Likely precipitated by pneumonia in the setting of COPD.    2. Healthcare associated pneumonia.  Continue ceftriaxone and azithromycin 3. Hypernatremia - Resolved now.   4. Hyperkalemia - Resolved.    5. COPD with acute exacerbation.  Continue bronchodilators.  Continue antibiotics and IV steroids. Appreciate pulmonary team following along. He is slowly improving.  6. AKI - resolved. 7. BPH.  Stable.  Continue outpatient regimen of Cardura.  8. Hypertension.  Will hold lisinopril for now due to renal dysfunction. 9. Dementia with behavioral disturbance - Pt having sundowning.  Ordered as needed lorazepam/haloperidol IV. 10. Normocytic Anemia -hemoglobin appears to have stabilized.  Hemoccult stools have been ordered.  DVT prophylaxis: lovenox  Code Status: full code, spoke with daughter she is unsure of patient's wishes about life support, will speak with him about it and let us know Family Communication:  Discussed with son over the phone Disposition Plan:  Skilled nursing facility placement once bed is available Consults called:  pulmonary, palliative care  Subjective: Feels her breathing is improving.  Continues to have productive cough.  Objective: Vitals:   08/13/17 1416 08/13/17 1423 08/13/17 1515 08/13/17 1933  BP: 125/77     Pulse: 87     Resp: 20     Temp:   (!) 97.5 F (36.4 C)   TempSrc:   Axillary   SpO2: 90% 92%  94%  Weight:      Height:        Intake/Output Summary (Last 24 hours) at 08/13/2017 2012 Last data  filed at 08/13/2017 1700 Gross per 24 hour  Intake 600 ml  Output 1000 ml  Net -400 ml   Filed Weights   08/08/17 0500 08/09/17 0300 08/10/17 0455  Weight: 61.3 kg (135 lb 2.3 oz) 62.7 kg (138 lb 3.7 oz) 61.3 kg (135 lb 2.3 oz)   REVIEW OF SYSTEMS  As per history otherwise all reviewed and reported negative  Exam:  General exam: Alert, awake, no distress Respiratory system: Occasional rhonchi, no wheezing. Respiratory effort normal. Cardiovascular system:RRR. No murmurs, rubs, gallops. Gastrointestinal system: Abdomen is nondistended, soft and nontender. No organomegaly or masses felt. Normal bowel sounds heard. Central nervous system:  No focal neurological deficits. Extremities: No C/C/E, +pedal pulses Skin: No rashes, lesions or ulcers Psychiatry: Confused, pleasant   Data Reviewed: Basic Metabolic Panel: Recent Labs  Lab 08/07/17 1316 08/08/17 0402 08/09/17 0753 08/10/17 0500 08/13/17 0611  NA 142 147* 142 141 137  K 5.0 5.3* 4.3 4.1 4.5  CL 106 113* 105 104 99  CO2 30 28 29 30  32  GLUCOSE 114* 94 122* 165* 156*  BUN 39* 32* 19 14 31*  CREATININE 1.62* 1.46* 1.09 1.19 1.18  CALCIUM 8.9 8.6* 8.7* 8.7* 9.0  MG  --   --   --  1.8  --    Liver Function Tests: Recent Labs  Lab 08/07/17 1316  AST 22  ALT 29  ALKPHOS 70  BILITOT 0.3  PROT 7.3  ALBUMIN 2.7*   No results for input(s): LIPASE,  AMYLASE in the last 168 hours. No results for input(s): AMMONIA in the last 168 hours. CBC: Recent Labs  Lab 08/07/17 1316 08/08/17 0402 08/09/17 0753 08/10/17 0500 08/11/17 0601 08/13/17 0611  WBC 6.5 6.1 7.0 4.9 8.0 9.4  NEUTROABS 4.5  --  5.4 4.5  --   --   HGB 8.3* 8.1* 8.2* 7.9* 7.4* 8.7*  HCT 27.9* 28.5* 27.6* 26.3* 24.8* 28.9*  MCV 97.2 99.0 97.2 96.3 95.4 94.8  PLT 359 311 266 301 310 368   Cardiac Enzymes: Recent Labs  Lab 08/07/17 1316  TROPONINI <0.03   CBG (last 3)  No results for input(s): GLUCAP in the last 72 hours. Recent Results (from  the past 240 hour(s))  Blood Culture (routine x 2)     Status: None   Collection Time: 08/07/17  1:16 PM  Result Value Ref Range Status   Specimen Description BLOOD RIGHT ARM DRAWN BY RN  Final   Special Requests   Final    BOTTLES DRAWN AEROBIC AND ANAEROBIC Blood Culture adequate volume   Culture   Final    NO GROWTH 5 DAYS Performed at [facility], [address] [name], [place] [address]    Report Status 08/12/2017 FINAL  Final  Blood Culture (routine x 2)     Status: None   Collection Time: 08/07/17  1:16 PM  Result Value Ref Range Status   Specimen Description BLOOD RIGHT HAND DRAWN BY RN  Final   Special Requests   Final    BOTTLES DRAWN AEROBIC AND ANAEROBIC Blood Culture adequate volume   Culture   Final    NO GROWTH 5 DAYS Performed at [facility], [address], [place], [place] [address]    Report Status 08/12/2017 FINAL  Final  MRSA PCR Screening     Status: None   Collection Time: 08/07/17  8:25 PM  Result Value Ref Range Status   MRSA by PCR NEGATIVE NEGATIVE Final    Comment:        The GeneXpert MRSA Assay (FDA approved for NASAL specimens only), is one component of a comprehensive MRSA colonization surveillance program. It is not intended to diagnose MRSA infection nor to guide or monitor treatment for MRSA infections. Performed at [facility], [address] [place], [place] [address]      Studies: Dg Swallowing Func-speech Pathology  Result Date: 08/13/2017 Objective Swallowing Evaluation: Type of Study: MBS-Modified Barium Swallow Study  Patient Details Name: TEION BALLIN MRN: [id number] Date of Birth: 1928/08/20 Today's Date: 08/13/2017 Time: SLP Start Time (ACUTE ONLY): 1345 -SLP Stop Time (ACUTE ONLY): 1420 SLP Time Calculation (min) (ACUTE ONLY): 35 min Past Medical History: Past Medical History: Diagnosis Date . Anxiety  . Cancer [facility]) 2013  Lung - cancer free x3 years.  . Hyperlipidemia  . Hypertension  . Myocardial infarction ([place]) 1999 Past  Surgical History: Past Surgical History: Procedure Laterality Date . CARPAL TUNNEL RELEASE  1980's  right and left . CHOLECYSTECTOMY   . COLONOSCOPY N/A 09/29/2014  Procedure: COLONOSCOPY;  Surgeon: Rogene Houston, MD;  [facility];  Service: Endoscopy;  Laterality: N/A; . CORONARY ARTERY BYPASS GRAFT  1999  5 grafts . FLEXIBLE BRONCHOSCOPY  11/07/2011  Procedure: FLEXIBLE BRONCHOSCOPY;  Surgeon: Gaye Pollack, MD;  Location: [place];  Service: Thoracic;  Laterality: N/A; . HERNIA REPAIR  5631  umbilical hernia repair . JOINT REPLACEMENT  2000  right shoulder rotator cuff repair HPI: 84 y.o. male with medical history  significant of COPD, hypertension, chronic kidney disease stage III admitted with pneumonia, and acute on chronic respiratory failure. BSE requested.  Subjective: "I am not coughing as much today." Assessment / Plan / Recommendation CHL IP CLINICAL IMPRESSIONS 08/13/2017 Clinical Impression Pt presents with mild/mod oral phase and moderate pharyngeal phase dysphagia with silent aspiration of thins and NTL. Oral phase is marked by reduced bolus cohesiveness, premature spillage, and piecemeal deglutition. Pharyngeal phase is marked by premature spillage with liquids to the level of the valleculae, reduced epiglottic deflection and tongue base retraction, reduced vocal fold closure, and prominent CP segment resulting in penetration/aspiration during and after the swallow with thins and NTL silent in nature (without benefit of protective cough). Pt with moderate vallecular residue post swallows with thins and min with NTL and HTL. Pt cued to cough strongly throughout the study and was able to expel aspirated material when just beneath vocal folds, however not fully removed once down anterior tracheal wall. Pt with best performance with purees as bolus remained cohesive and resulted in no residuals or penetration/aspiration. Pt did penetrate HTL after the swallow in small amounts and was not removed  spontaneously (required verbal cues) and is therefore at risk for aspiration with all liquids at this time. Recommend D2/chopped with HTL and ok for po meds whole in puree or with HTL. Pt will need to cough/clear throat periodically and repeat/dry swallow to facilitate pharyngeal clearance. Pt reports no recent history of PNA except for at present, so SLP is hopeful that Pt will be advanced once clinically stronger after rehab. Recommend f/u dysphagia intervention at SNF and SLP will continue to follow during acute stay. Above to Pt and RN.  SLP Visit Diagnosis Dysphagia, oropharyngeal phase (R13.12) Attention and concentration deficit following -- Frontal lobe and executive function deficit following -- Impact on safety and function Severe aspiration risk;Risk for inadequate nutrition/hydration   CHL IP TREATMENT RECOMMENDATION 08/13/2017 Treatment Recommendations Therapy as outlined in treatment plan below   Prognosis 08/13/2017 Prognosis for Safe Diet Advancement Fair Barriers to Reach Goals Severity of deficits Barriers/Prognosis Comment -- CHL IP DIET RECOMMENDATION 08/13/2017 SLP Diet Recommendations Dysphagia 2 (Fine chop) solids;Honey thick liquids Liquid Administration via Cup;No straw Medication Administration Whole meds with liquid Compensations Slow rate;Small sips/bites;Hard cough after swallow Postural Changes Remain semi-upright after after feeds/meals (Comment);Seated upright at 90 degrees   CHL IP OTHER RECOMMENDATIONS 08/13/2017 Recommended Consults -- Oral Care Recommendations Oral care BID;Staff/trained caregiver to provide oral care Other Recommendations Order thickener from pharmacy;Prohibited food (jello, ice cream, thin soups);Clarify dietary restrictions   CHL IP FOLLOW UP RECOMMENDATIONS 08/13/2017 Follow up Recommendations Skilled Nursing facility   [facility] IP FREQUENCY AND DURATION 08/13/2017 Speech Therapy Frequency (ACUTE ONLY) min 2x/week Treatment Duration 1 week      CHL IP ORAL PHASE 08/13/2017 Oral  Phase Impaired Oral - Pudding Teaspoon -- Oral - Pudding Cup -- Oral - Honey Teaspoon -- Oral - Honey Cup -- Oral - Nectar Teaspoon -- Oral - Nectar Cup Lingual pumping;Lingual/palatal residue;Piecemeal swallowing;Premature spillage Oral - Nectar Straw -- Oral - Thin Teaspoon Premature spillage Oral - Thin Cup -- Oral - Thin Straw -- Oral - Puree -- Oral - Mech Soft -- Oral - Regular Piecemeal swallowing;Delayed oral transit;Decreased bolus cohesion Oral - Multi-Consistency -- Oral - Pill -- Oral Phase - Comment --  CHL IP PHARYNGEAL PHASE 08/13/2017 Pharyngeal Phase Impaired Pharyngeal- Pudding Teaspoon -- Pharyngeal -- Pharyngeal- Pudding Cup -- Pharyngeal -- Pharyngeal- Honey Teaspoon -- Pharyngeal -- Pharyngeal- Honey  Cup Delayed swallow initiation-vallecula;Reduced epiglottic inversion;Penetration/Apiration after swallow;Pharyngeal residue - valleculae;Pharyngeal residue - cp segment Pharyngeal Material does not enter airway;Material enters airway, remains ABOVE vocal cords then ejected out;Material enters airway, remains ABOVE vocal cords and not ejected out Pharyngeal- Nectar Teaspoon -- Pharyngeal -- Pharyngeal- Nectar Cup Delayed swallow initiation-vallecula;Reduced epiglottic inversion;Reduced airway/laryngeal closure;Penetration/Aspiration during swallow;Penetration/Apiration after swallow;Trace aspiration;Pharyngeal residue - valleculae;Pharyngeal residue - cp segment Pharyngeal Material enters airway, remains ABOVE vocal cords and not ejected out;Material enters airway, passes BELOW cords without attempt by patient to eject out (silent aspiration) Pharyngeal- Nectar Straw -- Pharyngeal -- Pharyngeal- Thin Teaspoon Delayed swallow initiation-vallecula;Reduced epiglottic inversion;Reduced airway/laryngeal closure;Penetration/Apiration after swallow;Pharyngeal residue - valleculae Pharyngeal Material enters airway, passes BELOW cords without attempt by patient to eject out (silent aspiration) Pharyngeal-  Thin Cup Delayed swallow initiation-vallecula;Reduced airway/laryngeal closure;Penetration/Aspiration during swallow;Penetration/Apiration after swallow;Reduced epiglottic inversion;Moderate aspiration;Pharyngeal residue - valleculae;Pharyngeal residue - cp segment Pharyngeal Material enters airway, passes BELOW cords without attempt by patient to eject out (silent aspiration);Material enters airway, passes BELOW cords and not ejected out despite cough attempt by patient Pharyngeal- Thin Straw -- Pharyngeal -- Pharyngeal- Puree Delayed swallow initiation-vallecula;WFL Pharyngeal -- Pharyngeal- Mechanical Soft -- Pharyngeal -- Pharyngeal- Regular Delayed swallow initiation-vallecula;Pharyngeal residue - cp segment Pharyngeal -- Pharyngeal- Multi-consistency -- Pharyngeal -- Pharyngeal- Pill WFL Pharyngeal -- Pharyngeal Comment --  CHL IP CERVICAL ESOPHAGEAL PHASE 08/13/2017 Cervical Esophageal Phase Impaired Pudding Teaspoon -- Pudding Cup -- Honey Teaspoon -- Honey Cup -- Nectar Teaspoon -- Nectar Cup Prominent cricopharyngeal segment Nectar Straw -- Thin Teaspoon -- Thin Cup -- Thin Straw -- Puree -- Mechanical Soft -- Regular -- Multi-consistency -- Pill -- Cervical Esophageal Comment -- Thank you, Genene Churn, [place] No flowsheet data found. PORTER,DABNEY 08/13/2017, 6:20 PM              Scheduled Meds: . aspirin EC  81 mg Oral Daily  . chlorhexidine  15 mL Mouth Rinse BID  . diltiazem  120 mg Oral Daily  . doxazosin  2 mg Oral Daily  . enoxaparin (LOVENOX) injection  40 mg Subcutaneous Q24H  . gabapentin  100 mg Oral BID  . ipratropium-albuterol  3 mL Nebulization TID  . iron polysaccharides  150 mg Oral Daily  . mouth rinse  15 mL Mouth Rinse q12n4p  . methylPREDNISolone (SOLU-MEDROL) injection  40 mg Intravenous Q12H   Continuous Infusions: . azithromycin Stopped (08/13/17 1418)  . cefTRIAXone (ROCEPHIN)  IV Stopped (08/13/17 1315)    Active Problems:   Anxiety state    Essential hypertension   COPD with emphysema (HCC)   BPH (benign prostatic hyperplasia)   CKD (chronic kidney disease), stage III (HCC)   Acute respiratory failure with hypoxia and hypercapnia (HCC)   PNA (pneumonia)   Community acquired pneumonia of right lower lobe of lung ([place])   Goals of care, counseling/discussion   Palliative care by specialist   DNR (do not resuscitate) discussion   Sundowning  Kathie Dike, MD [facility] Pager [phone]  If 7PM-7AM, please contact night-coverage [email or web address] Password [email or web address] 08/13/2017, 8:12 PM    LOS: 6 days

## 2017-08-13 NOTE — NC FL2 (Signed)
[place] MEDICAID FL2 LEVEL OF CARE SCREENING TOOL     IDENTIFICATION  Patient Name: Steven [name] Birthdate: 1928-04-02 Sex: male Admission Date (Current Location): 08/07/2017  [facility] and [place] Number:  [employer] and Address:  [place] [address], [place]      Provider Number: [id number]  Attending Physician Name and Address:  Kathie Dike, MD  Relative Name and Phone Number:       Current Level of Care: Hospital Recommended Level of Care: [place] Prior Approval Number:    Date Approved/Denied:   PASRR Number: [id number] )  Discharge Plan: SNF    Current Diagnoses: Patient Active Problem List   Diagnosis Date Noted  . Sundowning 08/09/2017  . Community acquired pneumonia of right lower lobe of lung ([place])   . Goals of care, counseling/discussion   . Palliative care by specialist   . DNR (do not resuscitate) discussion   . Acute respiratory failure with hypoxia and hypercapnia ([place]) 08/07/2017  . PNA (pneumonia) 08/07/2017  . Abdominal bruit 10/15/2014  . CKD (chronic kidney disease), stage III ([place]) 09/28/2014  . BPH (benign prostatic hyperplasia) 02/25/2013  . Squamous cell carcinoma of lung ([place]) 11/13/2011  . COPD with emphysema ([place]) 07/29/2007  . Hyperlipidemia with target LDL less than 100 07/28/2007  . Anxiety state 07/28/2007  . ERECTILE DYSFUNCTION 07/28/2007  . Essential hypertension 07/28/2007  . Coronary atherosclerosis 07/28/2007    Orientation RESPIRATION BLADDER Height & Weight        O2(2L) Incontinent Weight: 135 lb 2.3 oz (61.3 kg) Height:  5\' 7"  (170.2 cm)  BEHAVIORAL SYMPTOMS/MOOD NEUROLOGICAL BOWEL NUTRITION STATUS      Incontinent Diet(see discharge summary )  AMBULATORY STATUS COMMUNICATION OF NEEDS Skin   Limited Assist   Normal                       Personal Care Assistance Level of Assistance  Bathing, Feeding, Dressing Bathing Assistance:  Maximum assistance Feeding assistance: Limited assistance Dressing Assistance: Maximum assistance     Functional Limitations Info  Sight, Hearing, Speech Sight Info: Adequate Hearing Info: Adequate Speech Info: Adequate    SPECIAL CARE FACTORS FREQUENCY  PT (By licensed PT)     PT Frequency: 5x/week              Contractures Contractures Info: Not present    Additional Factors Info  Code Status, Allergies, Psychotropic Code Status Info: Full code Allergies Info: NKA Psychotropic Info: Klonopin         Current Medications (08/13/2017):  This is the current hospital active medication list Current Facility-Administered Medications  Medication Dose Route Frequency Provider Last Rate Last Dose  . acetaminophen (TYLENOL) tablet 650 mg  650 mg Oral Q6H PRN Kathie Dike, MD       Or  . acetaminophen (TYLENOL) suppository 650 mg  650 mg Rectal Q6H PRN Kathie Dike, MD      . albuterol (PROVENTIL) (2.5 MG/3ML) 0.083% nebulizer solution 2.5 mg  2.5 mg Nebulization Q2H PRN Kathie Dike, MD   2.5 mg at 08/11/17 0915  . aspirin EC tablet 81 mg  81 mg Oral Daily Kathie Dike, MD   81 mg at 08/13/17 0944  . azithromycin (ZITHROMAX) 500 mg in sodium chloride 0.9 % 250 mL IVPB  500 mg Intravenous Q24H Kathie Dike, MD   Stopped at 08/13/17 1418  . cefTRIAXone (ROCEPHIN) 2 g in sodium chloride 0.9 % 100  mL IVPB  2 g Intravenous Q24H Kathie Dike, MD   Stopped at 08/13/17 1315  . chlorhexidine (PERIDEX) 0.12 % solution 15 mL  15 mL Mouth Rinse BID Kathie Dike, MD   15 mL at 08/13/17 0944  . clonazePAM (KLONOPIN) tablet 1 mg  1 mg Oral TID PRN Kathie Dike, MD   1 mg at 08/12/17 2128  . diltiazem (CARDIZEM CD) 24 hr capsule 120 mg  120 mg Oral Daily Johnson, Clanford L, MD   120 mg at 08/13/17 0945  . doxazosin (CARDURA) tablet 2 mg  2 mg Oral Daily Kathie Dike, MD   2 mg at 08/13/17 0944  . enoxaparin (LOVENOX) injection 40 mg  40 mg Subcutaneous Q24H Kathie Dike, MD   40 mg at 08/12/17 2128  . gabapentin (NEURONTIN) capsule 100 mg  100 mg Oral BID Kathie Dike, MD   100 mg at 08/13/17 0944  . haloperidol lactate (HALDOL) injection 5 mg  5 mg Intravenous Q6H PRN Johnson, Clanford L, MD   5 mg at 08/10/17 1453  . hydrALAZINE (APRESOLINE) injection 10 mg  10 mg Intravenous Q4H PRN Johnson, Clanford L, MD   10 mg at 08/09/17 1331  . ipratropium-albuterol (DUONEB) 0.5-2.5 (3) MG/3ML nebulizer solution 3 mL  3 mL Nebulization TID Johnson, Clanford L, MD   3 mL at 08/13/17 1423  . iron polysaccharides (NIFEREX) capsule 150 mg  150 mg Oral Daily Johnson, Clanford L, MD   150 mg at 08/13/17 0944  . MEDLINE mouth rinse  15 mL Mouth Rinse q12n4p Kathie Dike, MD   15 mL at 08/12/17 1036  . methylPREDNISolone sodium succinate (SOLU-MEDROL) 40 mg/mL injection 40 mg  40 mg Intravenous Q12H Johnson, Clanford L, MD   40 mg at 08/13/17 1407  . ondansetron (ZOFRAN) tablet 4 mg  4 mg Oral Q6H PRN Kathie Dike, MD       Or  . ondansetron (ZOFRAN) injection 4 mg  4 mg Intravenous Q6H PRN Kathie Dike, MD         Discharge Medications: Please see discharge summary for a list of discharge medications.  Relevant Imaging Results:  Relevant Lab Results:   Additional Information SSN [id number] Clydene Pugh, LCSW

## 2017-08-13 NOTE — Progress Notes (Signed)
Modified Barium Swallow Progress Note  Patient Details  Name: Steven [name] MRN: [id number] Date of Birth: [birth year]  Today's Date: 08/13/2017  Modified Barium Swallow completed.  Full report located under Chart Review in the Imaging Section.  Brief recommendations include the following:  Clinical Impression  Pt presents with mild/mod oral phase and moderate pharyngeal phase dysphagia with silent aspiration of thins and NTL. Oral phase is marked by reduced bolus cohesiveness, premature spillage, and piecemeal deglutition. Pharyngeal phase is marked by premature spillage with liquids to the level of the valleculae, reduced epiglottic deflection and tongue base retraction, reduced vocal fold closure, and prominent CP segment resulting in penetration/aspiration during and after the swallow with thins and NTL silent in nature (without benefit of protective cough). Pt with moderate vallecular residue post swallows with thins and min with NTL and HTL. Pt cued to cough strongly throughout the study and was able to expel aspirated material when just beneath vocal folds, however not fully removed once down anterior tracheal wall. Pt with best performance with purees as bolus remained cohesive and resulted in no residuals or penetration/aspiration. Pt did penetrate HTL after the swallow in small amounts and was not removed spontaneously (required verbal cues) and is therefore at risk for aspiration with all liquids at this time. Recommend D2/chopped with HTL and ok for po meds whole in puree or with HTL. Pt will need to cough/clear throat periodically and repeat/dry swallow to facilitate pharyngeal clearance. Pt reports no recent history of PNA except for at present, so SLP is hopeful that Pt will be advanced once clinically stronger after rehab. Recommend f/u dysphagia intervention at SNF and SLP will continue to follow during acute stay. Above to Pt and RN.    Swallow Evaluation Recommendations       SLP  Diet Recommendations: Dysphagia 2 (Fine chop) solids;Honey thick liquids   Liquid Administration via: Cup;No straw   Medication Administration: Whole meds with liquid   Supervision: Patient able to self feed;Full supervision/cueing for compensatory strategies   Compensations: Slow rate;Small sips/bites;Hard cough after swallow   Postural Changes: Remain semi-upright after after feeds/meals (Comment);Seated upright at 90 degrees   Oral Care Recommendations: Oral care BID;Staff/trained caregiver to provide oral care   Other Recommendations: Order thickener from pharmacy;Prohibited food (jello, ice cream, thin soups);Clarify dietary restrictions   Thank you,  Genene Churn, [place]  PORTER,DABNEY 08/13/2017,5:54 PM

## 2017-08-13 NOTE — Care Management Note (Signed)
Case Management Note  Patient Details  Name: Steven [name] MRN: [id number] Date of Birth: [birth year]  If discussed at [place] Length of Stay Meetings, dates discussed:  08/13/2017  Additional Comments:  Alfio Loescher, [place], RN 08/13/2017, 2:02 PM

## 2017-08-13 NOTE — Progress Notes (Signed)
Subjective: He is overall about the same.  He is still short of breath.  It has been recommended that he go to skilled care facility for rehab.  Objective: Vital signs in last 24 hours: Temp:  [97.5 F (36.4 C)-97.6 F (36.4 C)] 97.5 F (36.4 C) (07/09 0514) Pulse Rate:  [93-105] 97 (07/09 0514) Resp:  [20-24] 20 (07/09 0514) BP: (125-179)/(61-71) 175/71 (07/09 0514) SpO2:  [92 %-96 %] 93 % (07/09 0727) Weight change:  Last BM Date: 08/09/17  Intake/Output from previous day: 07/08 0701 - 07/09 0700 In: 560 [P.O.:560] Out: 1000 [Urine:1000]  PHYSICAL EXAM General appearance: alert, cooperative and mild distress Resp: rhonchi bilaterally Cardio: regular rate and rhythm, S1, S2 normal, no murmur, click, rub or gallop GI: soft, non-tender; bowel sounds normal; no masses,  no organomegaly Extremities: extremities normal, atraumatic, no cyanosis or edema He is confused this morning  Lab Results:  Results for orders placed or performed during the hospital encounter of 08/07/17 (from the past 48 hour(s))  Basic metabolic panel     Status: Abnormal   Collection Time: 08/13/17  6:11 AM  Result Value Ref Range   Sodium 137 135 - 145 mmol/L   Potassium 4.5 3.5 - 5.1 mmol/L   Chloride 99 98 - 111 mmol/L    Comment: Please note change in reference range.   CO2 32 22 - 32 mmol/L   Glucose, Bld 156 (H) 70 - 99 mg/dL    Comment: Please note change in reference range.   BUN 31 (H) 8 - 23 mg/dL    Comment: Please note change in reference range.   Creatinine, Ser 1.18 0.61 - 1.24 mg/dL   Calcium 9.0 8.9 - 10.3 mg/dL   GFR calc non Af Amer 54 (L) >60 mL/min   GFR calc Af Amer >60 >60 mL/min    Comment: (NOTE) The eGFR has been calculated using the CKD EPI equation. This calculation has not been validated in all clinical situations. eGFR's persistently <60 mL/min signify possible Chronic Kidney Disease.    Anion gap 6 5 - 15    Comment: Performed at [facility], [address]  [place], [place] [address]  CBC     Status: Abnormal   Collection Time: 08/13/17  6:11 AM  Result Value Ref Range   WBC 9.4 4.0 - 10.5 K/uL   RBC 3.05 (L) 4.22 - 5.81 MIL/uL   Hemoglobin 8.7 (L) 13.0 - 17.0 g/dL   HCT 28.9 (L) 39.0 - 52.0 %   MCV 94.8 78.0 - 100.0 fL   MCH 28.5 26.0 - 34.0 pg   MCHC 30.1 30.0 - 36.0 g/dL   RDW 14.9 11.5 - 15.5 %   Platelets 368 150 - 400 K/uL    Comment: Performed at [facility], [address] [place], [place] [address]    ABGS No results for input(s): PHART, PO2ART, TCO2, HCO3 in the last 72 hours.  Invalid input(s): PCO2 CULTURES Recent Results (from the past 240 hour(s))  Blood Culture (routine x 2)     Status: None   Collection Time: 08/07/17  1:16 PM  Result Value Ref Range Status   Specimen Description BLOOD RIGHT ARM DRAWN BY RN  Final   Special Requests   Final    BOTTLES DRAWN AEROBIC AND ANAEROBIC Blood Culture adequate volume   Culture   Final    NO GROWTH 5 DAYS Performed at [facility], [address], [place], [place] [address]    Report Status 08/12/2017  FINAL  Final  Blood Culture (routine x 2)     Status: None   Collection Time: 08/07/17  1:16 PM  Result Value Ref Range Status   Specimen Description BLOOD RIGHT HAND DRAWN BY RN  Final   Special Requests   Final    BOTTLES DRAWN AEROBIC AND ANAEROBIC Blood Culture adequate volume   Culture   Final    NO GROWTH 5 DAYS Performed at [facility], [address], [place], [place] [address]    Report Status 08/12/2017 FINAL  Final  MRSA PCR Screening     Status: None   Collection Time: 08/07/17  8:25 PM  Result Value Ref Range Status   MRSA by PCR NEGATIVE NEGATIVE Final    Comment:        The GeneXpert MRSA Assay (FDA approved for NASAL specimens only), is one component of a comprehensive MRSA colonization surveillance program. It is not intended to diagnose MRSA infection nor to guide or monitor treatment for MRSA infections. Performed at [facility], [address], [place], [place] [address]    Studies/Results: No results found.  Medications:  Prior to Admission:  Medications Prior to Admission  Medication Sig Dispense Refill Last Dose  . albuterol (PROVENTIL HFA;VENTOLIN HFA) 108 (90 Base) MCG/ACT inhaler Inhale 1-2 puffs into the lungs every 4 (four) hours as needed for wheezing or shortness of breath. 1 Inhaler 0 Taking  . albuterol (PROVENTIL) (2.5 MG/3ML) 0.083% nebulizer solution Take 3 mLs (2.5 mg total) by nebulization every 2 (two) hours as needed for wheezing or shortness of breath. 75 mL 12 Taking  . aspirin 81 MG tablet Take 81 mg by mouth daily.    08/06/2017 at Unknown time  . calcium carbonate (TUMS - DOSED IN MG ELEMENTAL CALCIUM) 500 MG chewable tablet Chew 1 tablet by mouth daily.   08/06/2017 at Unknown time  . clonazePAM (KLONOPIN) 1 MG tablet Take 1 tablet (1 mg total) by mouth 3 (three) times daily as needed. 90 tablet 2 08/06/2017 at Unknown time  . doxazosin (CARDURA) 2 MG tablet Take 1 tablet (2 mg total) by mouth daily. 90 tablet 1 08/06/2017 at Unknown time  . gabapentin (NEURONTIN) 100 MG capsule Take 1 capsule (100 mg total) by mouth 2 (two) times daily. 60 capsule 3 08/07/2017 at Unknown time  . lisinopril (PRINIVIL,ZESTRIL) 20 MG tablet Take 1 tablet (20 mg total) by mouth daily. 90 tablet 1 08/06/2017 at Unknown time  . Multiple Vitamins-Iron (MULTIVITAMINS WITH IRON) TABS tablet Take 1 tablet by mouth daily.   08/06/2017 at Unknown time  . atorvastatin (LIPITOR) 10 MG tablet Take 1 tablet (10 mg total) by mouth daily. (Patient not taking: Reported on 08/07/2017) 90 tablet 1 Not Taking at Unknown time   Scheduled: . aspirin EC  81 mg Oral Daily  . chlorhexidine  15 mL Mouth Rinse BID  . diltiazem  120 mg Oral Daily  . doxazosin  2 mg Oral Daily  . enoxaparin (LOVENOX) injection  40 mg Subcutaneous Q24H  . gabapentin  100 mg Oral BID  . ipratropium-albuterol  3 mL Nebulization TID  . iron polysaccharides  150 mg Oral Daily  . mouth  rinse  15 mL Mouth Rinse q12n4p  . methylPREDNISolone (SOLU-MEDROL) injection  40 mg Intravenous Q12H   Continuous: . azithromycin Stopped (08/12/17 1354)  . cefTRIAXone (ROCEPHIN)  IV Stopped (08/12/17 1300)   QZR:[id number] **OR** acetaminophen, albuterol, clonazePAM, haloperidol lactate, hydrALAZINE, ondansetron **OR** ondansetron (ZOFRAN) IV  Assesment: He  was admitted with acute  hypoxic and hypercapnic respiratory failure.  He initially required BiPAP but now is on nasal cannula.  He has severe COPD with emphysema seen on x-ray.  He has pneumonia and that is being treated appropriately.  He is very deconditioned and it has been recommended that he go to skilled care facility  He has some element of dementia and is confused some this morning. Active Problems:   Anxiety state   Essential hypertension   COPD with emphysema (HCC)   BPH (benign prostatic hyperplasia)   CKD (chronic kidney disease), stage III (HCC)   Acute respiratory failure with hypoxia and hypercapnia (HCC)   PNA (pneumonia)   Community acquired pneumonia of right lower lobe of lung ([place])   Goals of care, counseling/discussion   Palliative care by specialist   DNR (do not resuscitate) discussion   Sundowning    Plan: Continue current treatments.  Since he is going to be set up for skilled care facility I will plan to follow more peripherally.  Thanks for allowing me to see him with you    LOS: 6 days   Arrie Borrelli L 08/13/2017, 8:39 AM

## 2017-08-14 LAB — CREATININE, SERUM
Creatinine, Ser: 1.11 mg/dL (ref 0.61–1.24)
GFR calc Af Amer: >60 mL/min (ref >60)
GFR calc non Af Amer: 58 mL/min — ABNORMAL LOW (ref >60)

## 2017-08-14 MED ORDER — PREDNISONE 20 MG PO TABS
40.0000 mg | ORAL_TABLET | Freq: Every day | ORAL | Status: DC
  Administered 2017-08-15: 40 mg via ORAL
  Filled 2017-08-14: qty 2

## 2017-08-14 NOTE — Progress Notes (Signed)
Physical Therapy Treatment Patient Details Name: ROGERS DITTER MRN: [id number] DOB: 09-18-1928 Today's Date: 08/14/2017    History of Present Illness BRANDLEY ALDRETE is a 84 y.o. male with medical history significant of COPD, hypertension, chronic kidney disease stage III.  History is somewhat limited since patient is currently on BiPAP.  No family present.  Information is obtained from the medical record.  Patient has been feeling short of breath and coughing for the past 3 days.  He is seen his primary care physician yesterday and was diagnosed with pneumonia.  He was started on azithromycin.  Overnight, his shortness of breath has substantially gotten worse.  EMS was called and is brought to the hospital for evaluation.  He denies any chest pain.  No vomiting or diarrhea.    PT Comments    Patient requires occasional VC's for safety to place hands on handle part of RW, demonstrates slow labored movement, able to ambulate without loss of balance, but limited secondary to fatigue and continued sitting up in chair after therapy.  Patient will benefit from continued physical therapy in hospital and recommended venue below to increase strength, balance, endurance for safe ADLs and gait.   Follow Up Recommendations  SNF;Supervision/Assistance - 24 hour     Equipment Recommendations  None recommended by PT    Recommendations for Other Services       Precautions / Restrictions Precautions Precautions: Fall Restrictions Weight Bearing Restrictions: No    Mobility  Bed Mobility               General bed mobility comments: presents seated in chair (assisted by nursing staff)  Transfers Overall transfer level: Needs assistance Equipment used: Rolling walker (2 wheeled) Transfers: Sit to/from [facility] Sit to Stand: Min assist Stand pivot transfers: Min assist          Ambulation/Gait Ambulation/Gait assistance: Min assist Gait Distance (Feet): 50  Feet Assistive device: Rolling walker (2 wheeled) Gait Pattern/deviations: Decreased step length - right;Decreased step length - left;Decreased stride length Gait velocity: slow   General Gait Details: slow labored cadence, required VC's for proper hand placement on RW, has difficulty making turns and limited secondary to c/o fatigue   Stairs             Wheelchair Mobility    Modified Rankin (Stroke Patients Only)       Balance Overall balance assessment: Needs assistance Sitting-balance support: Feet supported Sitting balance-Leahy Scale: Fair     Standing balance support: Bilateral upper extremity supported;During functional activity Standing balance-Leahy Scale: Fair                              Cognition Arousal/Alertness: Awake/alert Behavior During Therapy: WFL for tasks assessed/performed Overall Cognitive Status: No family/caregiver present to determine baseline cognitive functioning                                        Exercises General Exercises - Lower Extremity Long Arc Quad: Seated;AROM;Strengthening;Both;10 reps Hip Flexion/Marching: Seated;AROM;Strengthening;Both;10 reps Toe Raises: Seated;AROM;Strengthening;Both;10 reps Heel Raises: Seated;AROM;Strengthening;Both;10 reps    General Comments        Pertinent Vitals/Pain Pain Assessment: No/denies pain    Home Living                      Prior Function  PT Goals (current goals can now be found in the care plan section) Acute Rehab PT Goals Patient Stated Goal: To be able to walk again PT Goal Formulation: With patient Time For Goal Achievement: 08/27/17 Potential to Achieve Goals: Good Progress towards PT goals: Progressing toward goals    Frequency    Min 3X/week      PT Plan Current plan remains appropriate    Co-evaluation              AM-PAC PT "6 Clicks" Daily Activity  Outcome Measure  Difficulty turning over  in bed (including adjusting bedclothes, sheets and blankets)?: None Difficulty moving from lying on back to sitting on the side of the bed? : A Little Difficulty sitting down on and standing up from a chair with arms (e.g., wheelchair, bedside commode, etc,.)?: A Little Help needed moving to and from a bed to chair (including a wheelchair)?: A Little Help needed walking in hospital room?: A Little Help needed climbing 3-5 steps with a railing? : A Lot 6 Click Score: 18    End of Session Equipment Utilized During Treatment: Gait belt Activity Tolerance: Patient tolerated treatment well;Patient limited by fatigue Patient left: in chair;with call bell/phone within reach;with chair alarm set Nurse Communication: Mobility status PT Visit Diagnosis: Unsteadiness on feet (R26.81);Other abnormalities of gait and mobility (R26.89);Muscle weakness (generalized) (M62.81)     Time: 0932-1000 PT Time Calculation (min) (ACUTE ONLY): 28 min  Charges:  $Therapeutic Exercise: 8-22 mins $Therapeutic Activity: 8-22 mins                    G Codes:       11:06 AM, 08/31/2017 Lonell Grandchild, MPT Physical Therapist with [facility] [phone] office [phone] mobile phone

## 2017-08-14 NOTE — Clinical Social Work Note (Signed)
Steven [name] at [facility] started [employer] authorization.  She will notify LCSW when authorization is received.    Ambrose Pancoast D

## 2017-08-14 NOTE — Care Management Important Message (Signed)
Important Message  Patient Details  Name: Steven [name] MRN: [id number] Date of Birth: April 22, 1928   Medicare Important Message Given:  Yes    Shelda Altes 08/14/2017, 12:07 PM

## 2017-08-14 NOTE — Progress Notes (Signed)
PROGRESS NOTE  Steven [name]  NFA:[id number]  DOB: September 22, 1928  DOA: 08/07/2017 PCP: [name], [name]   Brief Admission Hx: 84 y.o. male with medical history significant of COPD, hypertension, chronic kidney disease stage III admitted with pneumonia, and acute on chronic respiratory failure.    MDM/Assessment & Plan:   1. Acute respiratory failure with hypoxia and hypercapnia. He is off BiPAP.  He is on nasal cannula.  Will continue to follow.  Likely precipitated by pneumonia in the setting of COPD.    2. Healthcare associated pneumonia.  Treated with intravenous antibiotics. 3. Hypernatremia - Resolved now.   4. Hyperkalemia - Resolved.    5. COPD with acute exacerbation.  Continue bronchodilators he is completed a course of antibiotics.  Will change steroids to prednisone. Appreciate pulmonary team following along. He is slowly improving.  6. AKI - resolved. 7. BPH.  Stable.  Continue outpatient regimen of Cardura.  8. Hypertension.  Will hold lisinopril for now due to renal dysfunction. 9. Dementia with behavioral disturbance - Pt having sundowning.  Ordered as needed lorazepam/haloperidol IV. 10. Normocytic Anemia -hemoglobin appears to have stabilized.  Hemoccult stools have been ordered.  DVT prophylaxis: lovenox  Code Status: full code,  Family Communication:  No family present Disposition Plan:  Skilled nursing facility placement once bed is available Consults called:  pulmonary, palliative care  Subjective: Still feels short of breath.  Has nonproductive cough.  Objective: Vitals:   08/14/17 0738 08/14/17 1104 08/14/17 1316 08/14/17 1450  BP:  (!) 160/62 (!) 134/39   Pulse:   (!) 107   Resp:   20   Temp:   97.9 F (36.6 C)   TempSrc:   Oral   SpO2: 98%  91% 95%  Weight:      Height:        Intake/Output Summary (Last 24 hours) at 08/14/2017 1811 Last data filed at 08/14/2017 4696 Gross per 24 hour  Intake 120 ml  Output -  Net 120 ml   Filed  Weights   08/08/17 0500 08/09/17 0300 08/10/17 0455  Weight: 61.3 kg (135 lb 2.3 oz) 62.7 kg (138 lb 3.7 oz) 61.3 kg (135 lb 2.3 oz)   REVIEW OF SYSTEMS  As per history otherwise all reviewed and reported negative  Exam:  General exam: Alert, awake, no distress Respiratory system: Scattered rhonchi. Respiratory effort normal. Cardiovascular system:RRR. No murmurs, rubs, gallops. Gastrointestinal system: Abdomen is nondistended, soft and nontender. No organomegaly or masses felt. Normal bowel sounds heard. Central nervous system: No focal neurological deficits. Extremities: 1+ edema bilaterally Skin: No rashes, lesions or ulcers Psychiatry: Confused, pleasant  Data Reviewed: Basic Metabolic Panel: Recent Labs  Lab 08/08/17 0402 08/09/17 0753 08/10/17 0500 08/13/17 0611 08/14/17 0631  NA 147* 142 141 137  --   K 5.3* 4.3 4.1 4.5  --   CL 113* 105 104 99  --   CO2 28 29 30  32  --   GLUCOSE 94 122* 165* 156*  --   BUN 32* 19 14 31*  --   CREATININE 1.46* 1.09 1.19 1.18 1.11  CALCIUM 8.6* 8.7* 8.7* 9.0  --   MG  --   --  1.8  --   --    Liver Function Tests: No results for input(s): AST, ALT, ALKPHOS, BILITOT, PROT, ALBUMIN in the last 168 hours. No results for input(s): LIPASE, AMYLASE in the last 168 hours. No results for input(s): AMMONIA in the last 168 hours. CBC: Recent Labs  Lab 08/08/17 0402 08/09/17 0753 08/10/17 0500 08/11/17 0601 08/13/17 0611  WBC 6.1 7.0 4.9 8.0 9.4  NEUTROABS  --  5.4 4.5  --   --   HGB 8.1* 8.2* 7.9* 7.4* 8.7*  HCT 28.5* 27.6* 26.3* 24.8* 28.9*  MCV 99.0 97.2 96.3 95.4 94.8  PLT 311 266 301 310 368   Cardiac Enzymes: No results for input(s): CKTOTAL, CKMB, CKMBINDEX, TROPONINI in the last 168 hours. CBG (last 3)  No results for input(s): GLUCAP in the last 72 hours. Recent Results (from the past 240 hour(s))  Blood Culture (routine x 2)     Status: None   Collection Time: 08/07/17  1:16 PM  Result Value Ref Range Status    Specimen Description BLOOD RIGHT ARM DRAWN BY RN  Final   Special Requests   Final    BOTTLES DRAWN AEROBIC AND ANAEROBIC Blood Culture adequate volume   Culture   Final    NO GROWTH 5 DAYS Performed at [facility], [address], [place], [place] [address]    Report Status 08/12/2017 FINAL  Final  Blood Culture (routine x 2)     Status: None   Collection Time: 08/07/17  1:16 PM  Result Value Ref Range Status   Specimen Description BLOOD RIGHT HAND DRAWN BY RN  Final   Special Requests   Final    BOTTLES DRAWN AEROBIC AND ANAEROBIC Blood Culture adequate volume   Culture   Final    NO GROWTH 5 DAYS Performed at [facility], [address], [place], [place] [address]    Report Status 08/12/2017 FINAL  Final  MRSA PCR Screening     Status: None   Collection Time: 08/07/17  8:25 PM  Result Value Ref Range Status   MRSA by PCR NEGATIVE NEGATIVE Final    Comment:        The GeneXpert MRSA Assay (FDA approved for NASAL specimens only), is one component of a comprehensive MRSA colonization surveillance program. It is not intended to diagnose MRSA infection nor to guide or monitor treatment for MRSA infections. Performed at [facility], [address] [place],  [address]      Studies: Dg Swallowing Func-speech Pathology  Result Date: 08/13/2017 Objective Swallowing Evaluation: Type of Study: MBS-Modified Barium Swallow Study  Patient Details Name: Steven [name] MRN: [id number] Date of Birth: December 07, 1928 Today's Date: 08/13/2017 Time: SLP Start Time (ACUTE ONLY): 1345 -SLP Stop Time (ACUTE ONLY): 1420 SLP Time Calculation (min) (ACUTE ONLY): 35 min Past Medical History: Past Medical History: Diagnosis Date . Anxiety  . Cancer [facility]) 2013  Lung - cancer free x3 years.  . Hyperlipidemia  . Hypertension  . Myocardial infarction ([place]) 1999 Past Surgical History: Past Surgical History: Procedure Laterality Date . CARPAL TUNNEL RELEASE  1980's  right and left . CHOLECYSTECTOMY   .  COLONOSCOPY N/A 09/29/2014  Procedure: COLONOSCOPY;  Surgeon: Rogene Houston, MD;  [facility];  Service: Endoscopy;  Laterality: N/A; . CORONARY ARTERY BYPASS GRAFT  1999  5 grafts . FLEXIBLE BRONCHOSCOPY  11/07/2011  Procedure: FLEXIBLE BRONCHOSCOPY;  Surgeon: Gaye Pollack, MD;  Location: [place];  Service: Thoracic;  Laterality: N/A; . HERNIA REPAIR  2130  umbilical hernia repair . JOINT REPLACEMENT  2000  right shoulder rotator cuff repair HPI: 84 y.o. male with medical history significant of COPD, hypertension, chronic kidney disease stage III admitted with pneumonia, and acute on chronic respiratory failure. BSE requested.  Subjective: "I am not coughing  as much today." Assessment / Plan / Recommendation CHL IP CLINICAL IMPRESSIONS 08/13/2017 Clinical Impression Pt presents with mild/mod oral phase and moderate pharyngeal phase dysphagia with silent aspiration of thins and NTL. Oral phase is marked by reduced bolus cohesiveness, premature spillage, and piecemeal deglutition. Pharyngeal phase is marked by premature spillage with liquids to the level of the valleculae, reduced epiglottic deflection and tongue base retraction, reduced vocal fold closure, and prominent CP segment resulting in penetration/aspiration during and after the swallow with thins and NTL silent in nature (without benefit of protective cough). Pt with moderate vallecular residue post swallows with thins and min with NTL and HTL. Pt cued to cough strongly throughout the study and was able to expel aspirated material when just beneath vocal folds, however not fully removed once down anterior tracheal wall. Pt with best performance with purees as bolus remained cohesive and resulted in no residuals or penetration/aspiration. Pt did penetrate HTL after the swallow in small amounts and was not removed spontaneously (required verbal cues) and is therefore at risk for aspiration with all liquids at this time. Recommend D2/chopped with HTL  and ok for po meds whole in puree or with HTL. Pt will need to cough/clear throat periodically and repeat/dry swallow to facilitate pharyngeal clearance. Pt reports no recent history of PNA except for at present, so SLP is hopeful that Pt will be advanced once clinically stronger after rehab. Recommend f/u dysphagia intervention at SNF and SLP will continue to follow during acute stay. Above to Pt and RN.  SLP Visit Diagnosis Dysphagia, oropharyngeal phase (R13.12) Attention and concentration deficit following -- Frontal lobe and executive function deficit following -- Impact on safety and function Severe aspiration risk;Risk for inadequate nutrition/hydration   CHL IP TREATMENT RECOMMENDATION 08/13/2017 Treatment Recommendations Therapy as outlined in treatment plan below   Prognosis 08/13/2017 Prognosis for Safe Diet Advancement Fair Barriers to Reach Goals Severity of deficits Barriers/Prognosis Comment -- CHL IP DIET RECOMMENDATION 08/13/2017 SLP Diet Recommendations Dysphagia 2 (Fine chop) solids;Honey thick liquids Liquid Administration via Cup;No straw Medication Administration Whole meds with liquid Compensations Slow rate;Small sips/bites;Hard cough after swallow Postural Changes Remain semi-upright after after feeds/meals (Comment);Seated upright at 90 degrees   CHL IP OTHER RECOMMENDATIONS 08/13/2017 Recommended Consults -- Oral Care Recommendations Oral care BID;Staff/trained caregiver to provide oral care Other Recommendations Order thickener from pharmacy;Prohibited food (jello, ice cream, thin soups);Clarify dietary restrictions   CHL IP FOLLOW UP RECOMMENDATIONS 08/13/2017 Follow up Recommendations Skilled Nursing facility   [facility] IP FREQUENCY AND DURATION 08/13/2017 Speech Therapy Frequency (ACUTE ONLY) min 2x/week Treatment Duration 1 week      CHL IP ORAL PHASE 08/13/2017 Oral Phase Impaired Oral - Pudding Teaspoon -- Oral - Pudding Cup -- Oral - Honey Teaspoon -- Oral - Honey Cup -- Oral - Nectar Teaspoon --  Oral - Nectar Cup Lingual pumping;Lingual/palatal residue;Piecemeal swallowing;Premature spillage Oral - Nectar Straw -- Oral - Thin Teaspoon Premature spillage Oral - Thin Cup -- Oral - Thin Straw -- Oral - Puree -- Oral - Mech Soft -- Oral - Regular Piecemeal swallowing;Delayed oral transit;Decreased bolus cohesion Oral - Multi-Consistency -- Oral - Pill -- Oral Phase - Comment --  CHL IP PHARYNGEAL PHASE 08/13/2017 Pharyngeal Phase Impaired Pharyngeal- Pudding Teaspoon -- Pharyngeal -- Pharyngeal- Pudding Cup -- Pharyngeal -- Pharyngeal- Honey Teaspoon -- Pharyngeal -- Pharyngeal- Honey Cup Delayed swallow initiation-vallecula;Reduced epiglottic inversion;Penetration/Apiration after swallow;Pharyngeal residue - valleculae;Pharyngeal residue - cp segment Pharyngeal Material does not enter airway;Material enters airway, remains ABOVE vocal  cords then ejected out;Material enters airway, remains ABOVE vocal cords and not ejected out Pharyngeal- Nectar Teaspoon -- Pharyngeal -- Pharyngeal- Nectar Cup Delayed swallow initiation-vallecula;Reduced epiglottic inversion;Reduced airway/laryngeal closure;Penetration/Aspiration during swallow;Penetration/Apiration after swallow;Trace aspiration;Pharyngeal residue - valleculae;Pharyngeal residue - cp segment Pharyngeal Material enters airway, remains ABOVE vocal cords and not ejected out;Material enters airway, passes BELOW cords without attempt by patient to eject out (silent aspiration) Pharyngeal- Nectar Straw -- Pharyngeal -- Pharyngeal- Thin Teaspoon Delayed swallow initiation-vallecula;Reduced epiglottic inversion;Reduced airway/laryngeal closure;Penetration/Apiration after swallow;Pharyngeal residue - valleculae Pharyngeal Material enters airway, passes BELOW cords without attempt by patient to eject out (silent aspiration) Pharyngeal- Thin Cup Delayed swallow initiation-vallecula;Reduced airway/laryngeal closure;Penetration/Aspiration during swallow;Penetration/Apiration  after swallow;Reduced epiglottic inversion;Moderate aspiration;Pharyngeal residue - valleculae;Pharyngeal residue - cp segment Pharyngeal Material enters airway, passes BELOW cords without attempt by patient to eject out (silent aspiration);Material enters airway, passes BELOW cords and not ejected out despite cough attempt by patient Pharyngeal- Thin Straw -- Pharyngeal -- Pharyngeal- Puree Delayed swallow initiation-vallecula;WFL Pharyngeal -- Pharyngeal- Mechanical Soft -- Pharyngeal -- Pharyngeal- Regular Delayed swallow initiation-vallecula;Pharyngeal residue - cp segment Pharyngeal -- Pharyngeal- Multi-consistency -- Pharyngeal -- Pharyngeal- Pill WFL Pharyngeal -- Pharyngeal Comment --  CHL IP CERVICAL ESOPHAGEAL PHASE 08/13/2017 Cervical Esophageal Phase Impaired Pudding Teaspoon -- Pudding Cup -- Honey Teaspoon -- Honey Cup -- Nectar Teaspoon -- Nectar Cup Prominent cricopharyngeal segment Nectar Straw -- Thin Teaspoon -- Thin Cup -- Thin Straw -- Puree -- Mechanical Soft -- Regular -- Multi-consistency -- Pill -- Cervical Esophageal Comment -- Thank you, Genene Churn, [place] No flowsheet data found. PORTER,DABNEY 08/13/2017, 6:20 PM              Scheduled Meds: . aspirin EC  81 mg Oral Daily  . chlorhexidine  15 mL Mouth Rinse BID  . diltiazem  120 mg Oral Daily  . doxazosin  2 mg Oral Daily  . enoxaparin (LOVENOX) injection  40 mg Subcutaneous Q24H  . gabapentin  100 mg Oral BID  . ipratropium-albuterol  3 mL Nebulization TID  . iron polysaccharides  150 mg Oral Daily  . mouth rinse  15 mL Mouth Rinse q12n4p  . methylPREDNISolone (SOLU-MEDROL) injection  40 mg Intravenous Q12H   Continuous Infusions:   Active Problems:   Anxiety state   Essential hypertension   COPD with emphysema (HCC)   BPH (benign prostatic hyperplasia)   CKD (chronic kidney disease), stage III (HCC)   Acute respiratory failure with hypoxia and hypercapnia (HCC)   PNA (pneumonia)   Community acquired  pneumonia of right lower lobe of lung ([place])   Goals of care, counseling/discussion   Palliative care by specialist   DNR (do not resuscitate) discussion   Sundowning  Kathie Dike, MD [facility] Pager [phone]  If 7PM-7AM, please contact night-coverage [email or web address] Password [email or web address] 08/14/2017, 6:11 PM    LOS: 7 days

## 2017-08-15 DIAGNOSIS — R279 Unspecified lack of coordination: Secondary | ICD-10-CM | POA: Diagnosis not present

## 2017-08-15 DIAGNOSIS — J9622 Acute and chronic respiratory failure with hypercapnia: Secondary | ICD-10-CM | POA: Diagnosis not present

## 2017-08-15 DIAGNOSIS — J189 Pneumonia, unspecified organism: Secondary | ICD-10-CM | POA: Diagnosis not present

## 2017-08-15 DIAGNOSIS — J449 Chronic obstructive pulmonary disease, unspecified: Secondary | ICD-10-CM | POA: Diagnosis not present

## 2017-08-15 DIAGNOSIS — R7989 Other specified abnormal findings of blood chemistry: Secondary | ICD-10-CM | POA: Diagnosis not present

## 2017-08-15 DIAGNOSIS — E559 Vitamin D deficiency, unspecified: Secondary | ICD-10-CM | POA: Diagnosis not present

## 2017-08-15 DIAGNOSIS — J439 Emphysema, unspecified: Secondary | ICD-10-CM | POA: Diagnosis not present

## 2017-08-15 DIAGNOSIS — Z8701 Personal history of pneumonia (recurrent): Secondary | ICD-10-CM | POA: Diagnosis not present

## 2017-08-15 DIAGNOSIS — N400 Enlarged prostate without lower urinary tract symptoms: Secondary | ICD-10-CM | POA: Diagnosis not present

## 2017-08-15 DIAGNOSIS — F0391 Unspecified dementia with behavioral disturbance: Secondary | ICD-10-CM | POA: Diagnosis not present

## 2017-08-15 DIAGNOSIS — R748 Abnormal levels of other serum enzymes: Secondary | ICD-10-CM | POA: Diagnosis not present

## 2017-08-15 DIAGNOSIS — J9602 Acute respiratory failure with hypercapnia: Secondary | ICD-10-CM | POA: Diagnosis not present

## 2017-08-15 DIAGNOSIS — E7800 Pure hypercholesterolemia, unspecified: Secondary | ICD-10-CM | POA: Diagnosis not present

## 2017-08-15 DIAGNOSIS — R05 Cough: Secondary | ICD-10-CM | POA: Diagnosis not present

## 2017-08-15 DIAGNOSIS — I130 Hypertensive heart and chronic kidney disease with heart failure and stage 1 through stage 4 chronic kidney disease, or unspecified chronic kidney disease: Secondary | ICD-10-CM | POA: Diagnosis not present

## 2017-08-15 DIAGNOSIS — Z87891 Personal history of nicotine dependence: Secondary | ICD-10-CM | POA: Diagnosis not present

## 2017-08-15 DIAGNOSIS — F419 Anxiety disorder, unspecified: Secondary | ICD-10-CM | POA: Diagnosis present

## 2017-08-15 DIAGNOSIS — Z7951 Long term (current) use of inhaled steroids: Secondary | ICD-10-CM | POA: Diagnosis not present

## 2017-08-15 DIAGNOSIS — J9601 Acute respiratory failure with hypoxia: Secondary | ICD-10-CM | POA: Diagnosis not present

## 2017-08-15 DIAGNOSIS — Z85118 Personal history of other malignant neoplasm of bronchus and lung: Secondary | ICD-10-CM | POA: Diagnosis not present

## 2017-08-15 DIAGNOSIS — E782 Mixed hyperlipidemia: Secondary | ICD-10-CM | POA: Diagnosis not present

## 2017-08-15 DIAGNOSIS — R1310 Dysphagia, unspecified: Secondary | ICD-10-CM | POA: Diagnosis present

## 2017-08-15 DIAGNOSIS — F411 Generalized anxiety disorder: Secondary | ICD-10-CM | POA: Diagnosis not present

## 2017-08-15 DIAGNOSIS — Z951 Presence of aortocoronary bypass graft: Secondary | ICD-10-CM | POA: Diagnosis not present

## 2017-08-15 DIAGNOSIS — D649 Anemia, unspecified: Secondary | ICD-10-CM | POA: Diagnosis not present

## 2017-08-15 DIAGNOSIS — R0902 Hypoxemia: Secondary | ICD-10-CM | POA: Diagnosis not present

## 2017-08-15 DIAGNOSIS — R41841 Cognitive communication deficit: Secondary | ICD-10-CM | POA: Diagnosis not present

## 2017-08-15 DIAGNOSIS — E119 Type 2 diabetes mellitus without complications: Secondary | ICD-10-CM | POA: Diagnosis not present

## 2017-08-15 DIAGNOSIS — R1312 Dysphagia, oropharyngeal phase: Secondary | ICD-10-CM | POA: Diagnosis not present

## 2017-08-15 DIAGNOSIS — Z7982 Long term (current) use of aspirin: Secondary | ICD-10-CM | POA: Diagnosis not present

## 2017-08-15 DIAGNOSIS — N183 Chronic kidney disease, stage 3 (moderate): Secondary | ICD-10-CM | POA: Diagnosis not present

## 2017-08-15 DIAGNOSIS — E039 Hypothyroidism, unspecified: Secondary | ICD-10-CM | POA: Diagnosis not present

## 2017-08-15 DIAGNOSIS — Z79899 Other long term (current) drug therapy: Secondary | ICD-10-CM | POA: Diagnosis not present

## 2017-08-15 DIAGNOSIS — I509 Heart failure, unspecified: Secondary | ICD-10-CM | POA: Diagnosis not present

## 2017-08-15 DIAGNOSIS — J181 Lobar pneumonia, unspecified organism: Secondary | ICD-10-CM | POA: Diagnosis not present

## 2017-08-15 DIAGNOSIS — J690 Pneumonitis due to inhalation of food and vomit: Secondary | ICD-10-CM | POA: Diagnosis not present

## 2017-08-15 DIAGNOSIS — D518 Other vitamin B12 deficiency anemias: Secondary | ICD-10-CM | POA: Diagnosis not present

## 2017-08-15 DIAGNOSIS — I5033 Acute on chronic diastolic (congestive) heart failure: Secondary | ICD-10-CM | POA: Diagnosis not present

## 2017-08-15 DIAGNOSIS — Z7401 Bed confinement status: Secondary | ICD-10-CM | POA: Diagnosis not present

## 2017-08-15 DIAGNOSIS — Z66 Do not resuscitate: Secondary | ICD-10-CM | POA: Diagnosis present

## 2017-08-15 DIAGNOSIS — I10 Essential (primary) hypertension: Secondary | ICD-10-CM | POA: Diagnosis not present

## 2017-08-15 DIAGNOSIS — J90 Pleural effusion, not elsewhere classified: Secondary | ICD-10-CM | POA: Diagnosis not present

## 2017-08-15 DIAGNOSIS — R918 Other nonspecific abnormal finding of lung field: Secondary | ICD-10-CM | POA: Diagnosis not present

## 2017-08-15 DIAGNOSIS — I959 Hypotension, unspecified: Secondary | ICD-10-CM | POA: Diagnosis not present

## 2017-08-15 DIAGNOSIS — R0602 Shortness of breath: Secondary | ICD-10-CM | POA: Diagnosis not present

## 2017-08-15 DIAGNOSIS — R404 Transient alteration of awareness: Secondary | ICD-10-CM | POA: Diagnosis not present

## 2017-08-15 DIAGNOSIS — I4891 Unspecified atrial fibrillation: Secondary | ICD-10-CM | POA: Diagnosis not present

## 2017-08-15 DIAGNOSIS — R0989 Other specified symptoms and signs involving the circulatory and respiratory systems: Secondary | ICD-10-CM | POA: Diagnosis not present

## 2017-08-15 DIAGNOSIS — E785 Hyperlipidemia, unspecified: Secondary | ICD-10-CM | POA: Diagnosis present

## 2017-08-15 DIAGNOSIS — I252 Old myocardial infarction: Secondary | ICD-10-CM | POA: Diagnosis not present

## 2017-08-15 DIAGNOSIS — F0390 Unspecified dementia without behavioral disturbance: Secondary | ICD-10-CM | POA: Diagnosis not present

## 2017-08-15 DIAGNOSIS — D638 Anemia in other chronic diseases classified elsewhere: Secondary | ICD-10-CM | POA: Diagnosis present

## 2017-08-15 DIAGNOSIS — M6281 Muscle weakness (generalized): Secondary | ICD-10-CM | POA: Diagnosis not present

## 2017-08-15 DIAGNOSIS — J9621 Acute and chronic respiratory failure with hypoxia: Secondary | ICD-10-CM | POA: Diagnosis not present

## 2017-08-15 LAB — BASIC METABOLIC PANEL
Anion gap: 4 — ABNORMAL LOW (ref 5–15)
BUN: 35 mg/dL — ABNORMAL HIGH (ref 8–23)
CO2: 38 mmol/L — ABNORMAL HIGH (ref 22–32)
Calcium: 8.8 mg/dL — ABNORMAL LOW (ref 8.9–10.3)
Chloride: 99 mmol/L (ref 98–111)
Creatinine, Ser: 1.1 mg/dL (ref 0.61–1.24)
GFR calc Af Amer: >60 mL/min (ref >60)
GFR calc non Af Amer: 58 mL/min — ABNORMAL LOW (ref >60)
Glucose, Bld: 147 mg/dL — ABNORMAL HIGH (ref 70–99)
Potassium: 4.7 mmol/L (ref 3.5–5.1)
Sodium: 141 mmol/L (ref 135–145)

## 2017-08-15 LAB — CBC
HCT: 25.8 % — ABNORMAL LOW (ref 39.0–52.0)
Hemoglobin: 7.8 g/dL — ABNORMAL LOW (ref 13.0–17.0)
MCH: 28.8 pg (ref 26.0–34.0)
MCHC: 30.2 g/dL (ref 30.0–36.0)
MCV: 95.2 fL (ref 78.0–100.0)
Platelets: 316 10*3/uL (ref 150–400)
RBC: 2.71 MIL/uL — ABNORMAL LOW (ref 4.22–5.81)
RDW: 14.9 % (ref 11.5–15.5)
WBC: 7 10*3/uL (ref 4.0–10.5)

## 2017-08-15 MED ORDER — POLYSACCHARIDE IRON COMPLEX 150 MG PO CAPS: 150.0000 mg | ORAL_CAPSULE | Freq: Every day | ORAL | Status: DC

## 2017-08-15 MED ORDER — CLONAZEPAM 1 MG PO TABS: 0.5000 mg | ORAL_TABLET | Freq: Three times a day (TID) | ORAL | 0 refills | Status: DC | PRN

## 2017-08-15 MED ORDER — IPRATROPIUM-ALBUTEROL 0.5-2.5 (3) MG/3ML IN SOLN: 3.0000 mL | Freq: Three times a day (TID) | RESPIRATORY_TRACT | Status: DC

## 2017-08-15 MED ORDER — DILTIAZEM HCL ER COATED BEADS 120 MG PO CP24: 120.0000 mg | ORAL_CAPSULE | Freq: Every day | ORAL | Status: DC

## 2017-08-15 MED ORDER — PREDNISONE 10 MG PO TABS: ORAL_TABLET | ORAL | Status: DC

## 2017-08-15 NOTE — Progress Notes (Signed)
Cristina Gong daughter of Mr Mohan Erven notified of patients discharge.

## 2017-08-15 NOTE — Clinical Social Work Note (Signed)
Facility notified of discharge and discharge clinicals sent. RN notified daughter. Transport arranged.  LCSW signing off.   Mahnoor Mathisen, Clydene Pugh, LCSW

## 2017-08-15 NOTE — Progress Notes (Signed)
Report called to [place] staff LPN [employer]

## 2017-08-15 NOTE — Care Management Note (Addendum)
Case Management Note  Patient Details  Name: Steven [name] MRN: [id number] Date of Birth: 1928-02-26   If discussed at [place] Length of Stay Meetings, dates discussed:   08/15/2017 Additional Comments: CSW updated on discussion of LLOS.  Demone Lyles, Chauncey Reading, RN 08/15/2017, 12:06 PM

## 2017-08-15 NOTE — Clinical Social Work Note (Signed)
Late Entry for 08/14/17 Clinical Social Work Assessment  Patient Details  Name: Steven [name] MRN: [id number] Date of Birth: 1928/11/26  Date of referral:  08/14/17               Reason for consult:  Facility Placement                Permission sought to share information with:    Permission granted to share information::     Name::        Agency::     Relationship::     Contact Information:  Daughter, Ms. Sharen Counter  Housing/Transportation Living arrangements for the past 2 months:  Single Family Home Source of Information:  Adult Children Patient Interpreter Needed:  None Criminal Activity/Legal Involvement Pertinent to Current Situation/Hospitalization:  No - Comment as needed Significant Relationships:  Adult Children Lives with:  Self Do you feel safe going back to the place where you live?  Yes Need for family participation in patient care:  Yes (Comment)  Care giving concerns:  None identified at baseline.    Social Worker assessment / plan:  Ms. Sharen Counter, daughter, indicated that patient lives alone. She stated that at baseline, patient ambulates independently and is independent in his ADLs.   She is agreeable to short term rehab.   Employment status:  Retired Nurse, adult PT Recommendations:  [place] / Referral to community resources:  [place]  Patient/Family's Response to care:  Family is agreeable to short term rehab at [facility].   Patient/Family's Understanding of and Emotional Response to Diagnosis, Current Treatment, and Prognosis:  Family understands patient's treatment, diagnosis and prognosis and feel that short term rehab is needed to get patient back to his baseline.   Emotional Assessment Appearance:  Appears stated age Attitude/Demeanor/Rapport:    Affect (typically observed):  Calm Orientation:  Oriented to Self Alcohol / Substance use:  Not Applicable Psych involvement (Current and /or in  the community):  No (Comment)  Discharge Needs  Concerns to be addressed:  Discharge Planning Concerns Readmission within the last 30 days:  No Current discharge risk:  None Barriers to Discharge:  Insurance Authorization   Ihor Gully, LCSW 08/15/2017, 10:57 AM

## 2017-08-15 NOTE — Discharge Summary (Signed)
Physician Discharge Summary  Steven [name] PPI:[id number] DOB: Aug 20, 1928 DOA: 08/07/2017  PCP: [name], [name]  Admit date: 08/07/2017 Discharge date: 08/15/2017  Admitted From: Home Disposition: Skilled nursing facility  Recommendations for Outpatient Follow-up:  1. Follow up with PCP in 1-2 weeks 2. Please obtain BMP/CBC in one week 3. Repeat chest x-ray in 3 to 4 weeks 4. Recommend continue discussion with family regarding CODE STATUS  Discharge Condition: Stable CODE STATUS: Full code Diet recommendation: Dysphagia 2 diet with honey thick liquids  Brief/Interim Summary: 84 y.o.malewith medical history significant ofCOPD, hypertension, chronic kidney disease stage III admitted with pneumonia, and acute on chronic respiratory failure.  Discharge Diagnoses:  Active Problems:   Anxiety state   Essential hypertension   COPD with emphysema (HCC)   BPH (benign prostatic hyperplasia)   CKD (chronic kidney disease), stage III (HCC)   Acute respiratory failure with hypoxia and hypercapnia (HCC)   PNA (pneumonia)   Community acquired pneumonia of right lower lobe of lung ([place])   Goals of care, counseling/discussion   Palliative care by specialist   DNR (do not resuscitate) discussion   Sundowning  1. Acute respiratory failure with hypoxia and hypercapnia.  Initially placed on BiPAP, has since been weaned to nasal cannula.  Respiratory status appears to be stable.  Likely precipitated by COPD exacerbation the setting of pneumonia.  Continue to wean down oxygen as tolerated 2. Healthcare associated pneumonia.  Appropriately treated with intravenous antibiotics.  He is completed his course. 3. COPD exacerbation.  Treated with bronchodilators and antibiotics.  He is currently on a prednisone taper. 4. Dysphagia with high risk for aspiration.  Seen by speech therapy and underwent modified barium swallow.  He is currently on dysphagia 2 diet with honey thick liquids.  The patient  is a high risk for aspiration which is likely contributing to his overall difficulty breathing 5. Dementia with behavioral disturbances.  Patient did have sundowning.  He was ordered as needed lorazepam and haloperidol.  This has since stabilized. 6. Acute kidney injury.  This has resolved.  Lisinopril has been discontinued for now.  Resume as an outpatient as felt appropriate 7. Normocytic anemia.  No evidence of bleeding.  Previous iron studies indicated mild iron deficiency.  He is on oral iron supplementation.  Would repeat CBC in 1 week. 8. Hypertension.  Blood pressures currently stable.  Lisinopril currently on hold.  Continue on diltiazem. 9. Goals of care.  Seen by palliative care for goals of care.  Discussed with patient and daughter.  They seem to understand about his advanced age and frailty, but were not able to come to any firm decisions regarding his goals.  Would encourage further discussions around [place] since DNR would be very appropriate.  Discharge Instructions  Discharge Instructions    Diet - low sodium heart healthy   Complete by:  As directed    Increase activity slowly   Complete by:  As directed      Allergies as of 08/15/2017   No Known Allergies     Medication List    STOP taking these medications   atorvastatin 10 MG tablet Commonly known as:  LIPITOR   lisinopril 20 MG tablet Commonly known as:  PRINIVIL,ZESTRIL     TAKE these medications   albuterol (2.5 MG/3ML) 0.083% nebulizer solution Commonly known as:  PROVENTIL Take 3 mLs (2.5 mg total) by nebulization every 2 (two) hours as needed for wheezing or shortness of breath.   albuterol 108 (  90 Base) MCG/ACT inhaler Commonly known as:  PROVENTIL HFA;VENTOLIN HFA Inhale 1-2 puffs into the lungs every 4 (four) hours as needed for wheezing or shortness of breath.   aspirin 81 MG tablet Take 81 mg by mouth daily.   calcium carbonate 500 MG chewable tablet Commonly known as:  TUMS - dosed in  mg elemental calcium Chew 1 tablet by mouth daily.   clonazePAM 1 MG tablet Commonly known as:  KLONOPIN Take 0.5 tablets (0.5 mg total) by mouth 3 (three) times daily as needed. What changed:  how much to take   diltiazem 120 MG 24 hr capsule Commonly known as:  CARDIZEM CD Take 1 capsule (120 mg total) by mouth daily. Start taking on:  08/16/2017   doxazosin 2 MG tablet Commonly known as:  CARDURA Take 1 tablet (2 mg total) by mouth daily.   gabapentin 100 MG capsule Commonly known as:  NEURONTIN Take 1 capsule (100 mg total) by mouth 2 (two) times daily.   ipratropium-albuterol 0.5-2.5 (3) MG/3ML Soln Commonly known as:  DUONEB Take 3 mLs by nebulization 3 (three) times daily.   iron polysaccharides 150 MG capsule Commonly known as:  NIFEREX Take 1 capsule (150 mg total) by mouth daily. Start taking on:  08/16/2017   multivitamins with iron Tabs tablet Take 1 tablet by mouth daily.   predniSONE 10 MG tablet Commonly known as:  DELTASONE Take 40mg  po daily for 2 days then 30mg  daily for 2 days then 20mg  daily for 2 days then 10mg  daily for 2 days then stop      Contact information for after-discharge care    Destination    [facility] .   Service:  Skilled Nursing Contact information: [place] [phone]             No Known Allergies  Consultations:  Palliative care   Procedures/Studies: Dg Chest 1 View  Result Date: 08/07/2017 CLINICAL DATA:  Shortness of breath EXAM: CHEST  1 VIEW COMPARISON:  August 06, 2017 FINDINGS: There is somewhat less patchy airspace opacity in the right base compared to 1 day prior. There is atelectatic change in each lung base currently. Heart is upper normal in size with pulmonary vascularity normal. Patient is status post coronary artery bypass grafting. No adenopathy. No evident lesions. IMPRESSION: Partial clearing right base. Mild bibasilar atelectasis. Stable cardiac  silhouette. Electronically Signed   By: Lowella Grip III M.D.   On: 08/07/2017 13:40   Dg Chest 2 View  Result Date: 08/06/2017 CLINICAL DATA:  Cough and congestion EXAM: CHEST - 2 VIEW COMPARISON:  Jun 13, 2017 FINDINGS: There is patchy airspace consolidation in the right base. Lungs are otherwise clear although hyperexpanded. Heart size and pulmonary vascularity are normal. Patient is status post coronary artery bypass grafting. There is aortic atherosclerosis. No adenopathy. No evident bone lesions. IMPRESSION: Airspace consolidation consistent with pneumonia right base. Lungs elsewhere clear although hyperexpanded. Stable cardiac silhouette. Status post coronary artery bypass grafting. There is aortic atherosclerosis. Aortic Atherosclerosis (ICD10-I70.0). Electronically Signed   By: Lowella Grip III M.D.   On: 08/06/2017 15:41   Dg Swallowing Func-speech Pathology  Result Date: 08/13/2017 Objective Swallowing Evaluation: Type of Study: MBS-Modified Barium Swallow Study  Patient Details Name: Steven [name] MRN: [id number] Date of Birth: 1928/08/24 Today's Date: 08/13/2017 Time: SLP Start Time (ACUTE ONLY): 1345 -SLP Stop Time (ACUTE ONLY): 1420 SLP Time Calculation (min) (ACUTE ONLY): 35 min Past Medical History:  Past Medical History: Diagnosis Date . Anxiety  . Cancer [facility]) 2013  Lung - cancer free x3 years.  . Hyperlipidemia  . Hypertension  . Myocardial infarction ([place]) 1999 Past Surgical History: Past Surgical History: Procedure Laterality Date . CARPAL TUNNEL RELEASE  1980's  right and left . CHOLECYSTECTOMY   . COLONOSCOPY N/A 09/29/2014  Procedure: COLONOSCOPY;  Surgeon: Rogene Houston, MD;  [facility];  Service: Endoscopy;  Laterality: N/A; . CORONARY ARTERY BYPASS GRAFT  1999  5 grafts . FLEXIBLE BRONCHOSCOPY  11/07/2011  Procedure: FLEXIBLE BRONCHOSCOPY;  Surgeon: Gaye Pollack, MD;  Location: [place];  Service: Thoracic;  Laterality: N/A; . HERNIA REPAIR  1478  umbilical hernia  repair . JOINT REPLACEMENT  2000  right shoulder rotator cuff repair HPI: 84 y.o. male with medical history significant of COPD, hypertension, chronic kidney disease stage III admitted with pneumonia, and acute on chronic respiratory failure. BSE requested.  Subjective: "I am not coughing as much today." Assessment / Plan / Recommendation CHL IP CLINICAL IMPRESSIONS 08/13/2017 Clinical Impression Pt presents with mild/mod oral phase and moderate pharyngeal phase dysphagia with silent aspiration of thins and NTL. Oral phase is marked by reduced bolus cohesiveness, premature spillage, and piecemeal deglutition. Pharyngeal phase is marked by premature spillage with liquids to the level of the valleculae, reduced epiglottic deflection and tongue base retraction, reduced vocal fold closure, and prominent CP segment resulting in penetration/aspiration during and after the swallow with thins and NTL silent in nature (without benefit of protective cough). Pt with moderate vallecular residue post swallows with thins and min with NTL and HTL. Pt cued to cough strongly throughout the study and was able to expel aspirated material when just beneath vocal folds, however not fully removed once down anterior tracheal wall. Pt with best performance with purees as bolus remained cohesive and resulted in no residuals or penetration/aspiration. Pt did penetrate HTL after the swallow in small amounts and was not removed spontaneously (required verbal cues) and is therefore at risk for aspiration with all liquids at this time. Recommend D2/chopped with HTL and ok for po meds whole in puree or with HTL. Pt will need to cough/clear throat periodically and repeat/dry swallow to facilitate pharyngeal clearance. Pt reports no recent history of PNA except for at present, so SLP is hopeful that Pt will be advanced once clinically stronger after rehab. Recommend f/u dysphagia intervention at SNF and SLP will continue to follow during acute stay.  Above to Pt and RN.  SLP Visit Diagnosis Dysphagia, oropharyngeal phase (R13.12) Attention and concentration deficit following -- Frontal lobe and executive function deficit following -- Impact on safety and function Severe aspiration risk;Risk for inadequate nutrition/hydration   CHL IP TREATMENT RECOMMENDATION 08/13/2017 Treatment Recommendations Therapy as outlined in treatment plan below   Prognosis 08/13/2017 Prognosis for Safe Diet Advancement Fair Barriers to Reach Goals Severity of deficits Barriers/Prognosis Comment -- CHL IP DIET RECOMMENDATION 08/13/2017 SLP Diet Recommendations Dysphagia 2 (Fine chop) solids;Honey thick liquids Liquid Administration via Cup;No straw Medication Administration Whole meds with liquid Compensations Slow rate;Small sips/bites;Hard cough after swallow Postural Changes Remain semi-upright after after feeds/meals (Comment);Seated upright at 90 degrees   CHL IP OTHER RECOMMENDATIONS 08/13/2017 Recommended Consults -- Oral Care Recommendations Oral care BID;Staff/trained caregiver to provide oral care Other Recommendations Order thickener from pharmacy;Prohibited food (jello, ice cream, thin soups);Clarify dietary restrictions   CHL IP FOLLOW UP RECOMMENDATIONS 08/13/2017 Follow up Recommendations Skilled Nursing facility   CHL IP FREQUENCY AND DURATION  08/13/2017 Speech Therapy Frequency (ACUTE ONLY) min 2x/week Treatment Duration 1 week      CHL IP ORAL PHASE 08/13/2017 Oral Phase Impaired Oral - Pudding Teaspoon -- Oral - Pudding Cup -- Oral - Honey Teaspoon -- Oral - Honey Cup -- Oral - Nectar Teaspoon -- Oral - Nectar Cup Lingual pumping;Lingual/palatal residue;Piecemeal swallowing;Premature spillage Oral - Nectar Straw -- Oral - Thin Teaspoon Premature spillage Oral - Thin Cup -- Oral - Thin Straw -- Oral - Puree -- Oral - Mech Soft -- Oral - Regular Piecemeal swallowing;Delayed oral transit;Decreased bolus cohesion Oral - Multi-Consistency -- Oral - Pill -- Oral Phase - Comment --  CHL  IP PHARYNGEAL PHASE 08/13/2017 Pharyngeal Phase Impaired Pharyngeal- Pudding Teaspoon -- Pharyngeal -- Pharyngeal- Pudding Cup -- Pharyngeal -- Pharyngeal- Honey Teaspoon -- Pharyngeal -- Pharyngeal- Honey Cup Delayed swallow initiation-vallecula;Reduced epiglottic inversion;Penetration/Apiration after swallow;Pharyngeal residue - valleculae;Pharyngeal residue - cp segment Pharyngeal Material does not enter airway;Material enters airway, remains ABOVE vocal cords then ejected out;Material enters airway, remains ABOVE vocal cords and not ejected out Pharyngeal- Nectar Teaspoon -- Pharyngeal -- Pharyngeal- Nectar Cup Delayed swallow initiation-vallecula;Reduced epiglottic inversion;Reduced airway/laryngeal closure;Penetration/Aspiration during swallow;Penetration/Apiration after swallow;Trace aspiration;Pharyngeal residue - valleculae;Pharyngeal residue - cp segment Pharyngeal Material enters airway, remains ABOVE vocal cords and not ejected out;Material enters airway, passes BELOW cords without attempt by patient to eject out (silent aspiration) Pharyngeal- Nectar Straw -- Pharyngeal -- Pharyngeal- Thin Teaspoon Delayed swallow initiation-vallecula;Reduced epiglottic inversion;Reduced airway/laryngeal closure;Penetration/Apiration after swallow;Pharyngeal residue - valleculae Pharyngeal Material enters airway, passes BELOW cords without attempt by patient to eject out (silent aspiration) Pharyngeal- Thin Cup Delayed swallow initiation-vallecula;Reduced airway/laryngeal closure;Penetration/Aspiration during swallow;Penetration/Apiration after swallow;Reduced epiglottic inversion;Moderate aspiration;Pharyngeal residue - valleculae;Pharyngeal residue - cp segment Pharyngeal Material enters airway, passes BELOW cords without attempt by patient to eject out (silent aspiration);Material enters airway, passes BELOW cords and not ejected out despite cough attempt by patient Pharyngeal- Thin Straw -- Pharyngeal -- Pharyngeal-  Puree Delayed swallow initiation-vallecula;WFL Pharyngeal -- Pharyngeal- Mechanical Soft -- Pharyngeal -- Pharyngeal- Regular Delayed swallow initiation-vallecula;Pharyngeal residue - cp segment Pharyngeal -- Pharyngeal- Multi-consistency -- Pharyngeal -- Pharyngeal- Pill WFL Pharyngeal -- Pharyngeal Comment --  CHL IP CERVICAL ESOPHAGEAL PHASE 08/13/2017 Cervical Esophageal Phase Impaired Pudding Teaspoon -- Pudding Cup -- Honey Teaspoon -- Honey Cup -- Nectar Teaspoon -- Nectar Cup Prominent cricopharyngeal segment Nectar Straw -- Thin Teaspoon -- Thin Cup -- Thin Straw -- Puree -- Mechanical Soft -- Regular -- Multi-consistency -- Pill -- Cervical Esophageal Comment -- Thank you, Genene Churn, [place] No flowsheet data found. PORTER,DABNEY 08/13/2017, 6:20 PM                  Subjective: Patient feels that his breathing is doing better today.  Continues to have productive cough.  Discharge Exam: Vitals:   08/15/17 1139 08/15/17 1431  BP: (!) 162/80   Pulse: 95   Resp: 20   Temp:    SpO2: 92% 94%   Vitals:   08/15/17 0500 08/15/17 0743 08/15/17 1139 08/15/17 1431  BP: 136/60  (!) 162/80   Pulse: 97  95   Resp: 17  20   Temp: 98.3 F (36.8 C)     TempSrc: Oral     SpO2: 98% 96% 92% 94%  Weight:      Height:        General: Pt is alert, awake, not in acute distress Cardiovascular: RRR, S1/S2 +, no rubs, no gallops Respiratory: bilateral rhonchi Abdominal: Soft, NT, ND, bowel sounds + Extremities:  no edema, no cyanosis    The results of significant diagnostics from this hospitalization (including imaging, microbiology, ancillary and laboratory) are listed below for reference.     Microbiology: Recent Results (from the past 240 hour(s))  Blood Culture (routine x 2)     Status: None   Collection Time: 08/07/17  1:16 PM  Result Value Ref Range Status   Specimen Description BLOOD RIGHT ARM DRAWN BY RN  Final   Special Requests   Final    BOTTLES DRAWN AEROBIC AND  ANAEROBIC Blood Culture adequate volume   Culture   Final    NO GROWTH 5 DAYS Performed at [facility], [address], [place], [place] [address]    Report Status 08/12/2017 FINAL  Final  Blood Culture (routine x 2)     Status: None   Collection Time: 08/07/17  1:16 PM  Result Value Ref Range Status   Specimen Description BLOOD RIGHT HAND DRAWN BY RN  Final   Special Requests   Final    BOTTLES DRAWN AEROBIC AND ANAEROBIC Blood Culture adequate volume   Culture   Final    NO GROWTH 5 DAYS Performed at [facility], [address] [place], [place] [address]    Report Status 08/12/2017 FINAL  Final  MRSA PCR Screening     Status: None   Collection Time: 08/07/17  8:25 PM  Result Value Ref Range Status   MRSA by PCR NEGATIVE NEGATIVE Final    Comment:        The GeneXpert MRSA Assay (FDA approved for NASAL specimens only), is one component of a comprehensive MRSA colonization surveillance program. It is not intended to diagnose MRSA infection nor to guide or monitor treatment for MRSA infections. Performed at [facility], [address], [place], [place] [address]      Labs: BNP (last 3 results) No results for input(s): BNP in the last 8760 hours. Basic Metabolic Panel: Recent Labs  Lab 08/09/17 0753 08/10/17 0500 08/13/17 0611 08/14/17 0631 08/15/17 0454  NA 142 141 137  --  141  K 4.3 4.1 4.5  --  4.7  CL 105 104 99  --  99  CO2 29 30 32  --  38*  GLUCOSE 122* 165* 156*  --  147*  BUN 19 14 31*  --  35*  CREATININE 1.09 1.19 1.18 1.11 1.10  CALCIUM 8.7* 8.7* 9.0  --  8.8*  MG  --  1.8  --   --   --    Liver Function Tests: No results for input(s): AST, ALT, ALKPHOS, BILITOT, PROT, ALBUMIN in the last 168 hours. No results for input(s): LIPASE, AMYLASE in the last 168 hours. No results for input(s): AMMONIA in the last 168 hours. CBC: Recent Labs  Lab 08/09/17 0753 08/10/17 0500 08/11/17 0601 08/13/17 0611 08/15/17 0454  WBC 7.0 4.9 8.0 9.4 7.0   NEUTROABS 5.4 4.5  --   --   --   HGB 8.2* 7.9* 7.4* 8.7* 7.8*  HCT 27.6* 26.3* 24.8* 28.9* 25.8*  MCV 97.2 96.3 95.4 94.8 95.2  PLT 266 301 310 368 316   Cardiac Enzymes: No results for input(s): CKTOTAL, CKMB, CKMBINDEX, TROPONINI in the last 168 hours. BNP: Invalid input(s): POCBNP CBG: No results for input(s): GLUCAP in the last 168 hours. D-Dimer No results for input(s): DDIMER in the last 72 hours. Hgb A1c No results for input(s): HGBA1C in the last 72 hours. Lipid Profile No results for input(s): CHOL, HDL,  LDLCALC, TRIG, CHOLHDL, LDLDIRECT in the last 72 hours. Thyroid function studies No results for input(s): TSH, T4TOTAL, T3FREE, THYROIDAB in the last 72 hours.  Invalid input(s): FREET3 Anemia work up No results for input(s): VITAMINB12, FOLATE, FERRITIN, TIBC, IRON, RETICCTPCT in the last 72 hours. Urinalysis    Component Value Date/Time   COLORURINE YELLOW 08/07/2017 1252   APPEARANCEUR HAZY (A) 08/07/2017 1252   LABSPEC 1.020 08/07/2017 1252   PHURINE 5.0 08/07/2017 1252   GLUCOSEU NEGATIVE 08/07/2017 1252   HGBUR NEGATIVE 08/07/2017 1252   BILIRUBINUR NEGATIVE 08/07/2017 1252   KETONESUR NEGATIVE 08/07/2017 1252   PROTEINUR NEGATIVE 08/07/2017 1252   UROBILINOGEN 0.2 11/05/2011 1456   NITRITE NEGATIVE 08/07/2017 1252   LEUKOCYTESUR TRACE (A) 08/07/2017 1252   Sepsis Labs Invalid input(s): PROCALCITONIN,  WBC,  LACTICIDVEN Microbiology Recent Results (from the past 240 hour(s))  Blood Culture (routine x 2)     Status: None   Collection Time: 08/07/17  1:16 PM  Result Value Ref Range Status   Specimen Description BLOOD RIGHT ARM DRAWN BY RN  Final   Special Requests   Final    BOTTLES DRAWN AEROBIC AND ANAEROBIC Blood Culture adequate volume   Culture   Final    NO GROWTH 5 DAYS Performed at [facility], [address] [place], [place] [address]    Report Status 08/12/2017 FINAL  Final  Blood Culture (routine x 2)     Status: None   Collection  Time: 08/07/17  1:16 PM  Result Value Ref Range Status   Specimen Description BLOOD RIGHT HAND DRAWN BY RN  Final   Special Requests   Final    BOTTLES DRAWN AEROBIC AND ANAEROBIC Blood Culture adequate volume   Culture   Final    NO GROWTH 5 DAYS Performed at [facility], [address], [place], [place] [address]    Report Status 08/12/2017 FINAL  Final  MRSA PCR Screening     Status: None   Collection Time: 08/07/17  8:25 PM  Result Value Ref Range Status   MRSA by PCR NEGATIVE NEGATIVE Final    Comment:        The GeneXpert MRSA Assay (FDA approved for NASAL specimens only), is one component of a comprehensive MRSA colonization surveillance program. It is not intended to diagnose MRSA infection nor to guide or monitor treatment for MRSA infections. Performed at [facility], [address], [place], [place] [address]      Time coordinating discharge: 109mins  SIGNED:   Kathie Dike, MD  [facility] 08/15/2017, 4:17 PM Pager   If 7PM-7AM, please contact night-coverage [email or web address] Password [email or web address]

## 2017-08-15 NOTE — Progress Notes (Signed)
Transferred via EMS to [facility]

## 2017-08-27 ENCOUNTER — Other Ambulatory Visit: Payer: Self-pay

## 2017-08-27 ENCOUNTER — Ambulatory Visit: Payer: Medicare HMO | Admitting: *Deleted

## 2017-08-27 ENCOUNTER — Inpatient Hospital Stay (HOSPITAL_COMMUNITY)
Admission: EM | Admit: 2017-08-27 | Discharge: 2017-08-30 | DRG: 291 | Disposition: A | Discharge status: Skilled Nursing Facility | Payer: Medicare HMO | Source: Skilled Nursing Facility | Attending: Family Medicine | Admitting: Family Medicine

## 2017-08-27 ENCOUNTER — Emergency Department (HOSPITAL_COMMUNITY): Payer: Medicare HMO

## 2017-08-27 ENCOUNTER — Inpatient Hospital Stay (HOSPITAL_COMMUNITY): Payer: Medicare HMO

## 2017-08-27 ENCOUNTER — Encounter (HOSPITAL_COMMUNITY): Payer: Self-pay

## 2017-08-27 DIAGNOSIS — D649 Anemia, unspecified: Secondary | ICD-10-CM

## 2017-08-27 DIAGNOSIS — I5033 Acute on chronic diastolic (congestive) heart failure: Secondary | ICD-10-CM | POA: Diagnosis not present

## 2017-08-27 DIAGNOSIS — J90 Pleural effusion, not elsewhere classified: Secondary | ICD-10-CM | POA: Diagnosis not present

## 2017-08-27 DIAGNOSIS — Z7982 Long term (current) use of aspirin: Secondary | ICD-10-CM

## 2017-08-27 DIAGNOSIS — M6281 Muscle weakness (generalized): Secondary | ICD-10-CM | POA: Diagnosis not present

## 2017-08-27 DIAGNOSIS — Z7401 Bed confinement status: Secondary | ICD-10-CM | POA: Diagnosis not present

## 2017-08-27 DIAGNOSIS — J9601 Acute respiratory failure with hypoxia: Secondary | ICD-10-CM | POA: Diagnosis not present

## 2017-08-27 DIAGNOSIS — J9622 Acute and chronic respiratory failure with hypercapnia: Secondary | ICD-10-CM | POA: Diagnosis present

## 2017-08-27 DIAGNOSIS — Z8701 Personal history of pneumonia (recurrent): Secondary | ICD-10-CM

## 2017-08-27 DIAGNOSIS — D509 Iron deficiency anemia, unspecified: Secondary | ICD-10-CM | POA: Diagnosis not present

## 2017-08-27 DIAGNOSIS — R279 Unspecified lack of coordination: Secondary | ICD-10-CM | POA: Diagnosis not present

## 2017-08-27 DIAGNOSIS — J439 Emphysema, unspecified: Secondary | ICD-10-CM | POA: Diagnosis not present

## 2017-08-27 DIAGNOSIS — N183 Chronic kidney disease, stage 3 unspecified: Secondary | ICD-10-CM | POA: Diagnosis present

## 2017-08-27 DIAGNOSIS — I10 Essential (primary) hypertension: Secondary | ICD-10-CM | POA: Diagnosis present

## 2017-08-27 DIAGNOSIS — J9602 Acute respiratory failure with hypercapnia: Secondary | ICD-10-CM

## 2017-08-27 DIAGNOSIS — J9621 Acute and chronic respiratory failure with hypoxia: Secondary | ICD-10-CM

## 2017-08-27 DIAGNOSIS — R7989 Other specified abnormal findings of blood chemistry: Secondary | ICD-10-CM | POA: Diagnosis not present

## 2017-08-27 DIAGNOSIS — R404 Transient alteration of awareness: Secondary | ICD-10-CM | POA: Diagnosis not present

## 2017-08-27 DIAGNOSIS — E785 Hyperlipidemia, unspecified: Secondary | ICD-10-CM | POA: Diagnosis present

## 2017-08-27 DIAGNOSIS — Z7951 Long term (current) use of inhaled steroids: Secondary | ICD-10-CM | POA: Diagnosis not present

## 2017-08-27 DIAGNOSIS — F0391 Unspecified dementia with behavioral disturbance: Secondary | ICD-10-CM | POA: Diagnosis not present

## 2017-08-27 DIAGNOSIS — Z87891 Personal history of nicotine dependence: Secondary | ICD-10-CM

## 2017-08-27 DIAGNOSIS — J690 Pneumonitis due to inhalation of food and vomit: Secondary | ICD-10-CM | POA: Diagnosis present

## 2017-08-27 DIAGNOSIS — F419 Anxiety disorder, unspecified: Secondary | ICD-10-CM | POA: Diagnosis present

## 2017-08-27 DIAGNOSIS — D638 Anemia in other chronic diseases classified elsewhere: Secondary | ICD-10-CM | POA: Diagnosis present

## 2017-08-27 DIAGNOSIS — F411 Generalized anxiety disorder: Secondary | ICD-10-CM

## 2017-08-27 DIAGNOSIS — F0390 Unspecified dementia without behavioral disturbance: Secondary | ICD-10-CM | POA: Diagnosis not present

## 2017-08-27 DIAGNOSIS — R1310 Dysphagia, unspecified: Secondary | ICD-10-CM | POA: Diagnosis present

## 2017-08-27 DIAGNOSIS — R0602 Shortness of breath: Secondary | ICD-10-CM | POA: Diagnosis not present

## 2017-08-27 DIAGNOSIS — Z85118 Personal history of other malignant neoplasm of bronchus and lung: Secondary | ICD-10-CM | POA: Diagnosis not present

## 2017-08-27 DIAGNOSIS — J449 Chronic obstructive pulmonary disease, unspecified: Secondary | ICD-10-CM | POA: Diagnosis present

## 2017-08-27 DIAGNOSIS — J189 Pneumonia, unspecified organism: Secondary | ICD-10-CM | POA: Diagnosis not present

## 2017-08-27 DIAGNOSIS — R918 Other nonspecific abnormal finding of lung field: Secondary | ICD-10-CM | POA: Diagnosis not present

## 2017-08-27 DIAGNOSIS — Z79899 Other long term (current) drug therapy: Secondary | ICD-10-CM

## 2017-08-27 DIAGNOSIS — I4891 Unspecified atrial fibrillation: Secondary | ICD-10-CM | POA: Diagnosis not present

## 2017-08-27 DIAGNOSIS — I509 Heart failure, unspecified: Secondary | ICD-10-CM | POA: Diagnosis not present

## 2017-08-27 DIAGNOSIS — R05 Cough: Secondary | ICD-10-CM | POA: Diagnosis not present

## 2017-08-27 DIAGNOSIS — R778 Other specified abnormalities of plasma proteins: Secondary | ICD-10-CM

## 2017-08-27 DIAGNOSIS — R748 Abnormal levels of other serum enzymes: Secondary | ICD-10-CM | POA: Diagnosis not present

## 2017-08-27 DIAGNOSIS — I959 Hypotension, unspecified: Secondary | ICD-10-CM | POA: Diagnosis not present

## 2017-08-27 DIAGNOSIS — I130 Hypertensive heart and chronic kidney disease with heart failure and stage 1 through stage 4 chronic kidney disease, or unspecified chronic kidney disease: Principal | ICD-10-CM | POA: Diagnosis present

## 2017-08-27 DIAGNOSIS — R0989 Other specified symptoms and signs involving the circulatory and respiratory systems: Secondary | ICD-10-CM | POA: Diagnosis not present

## 2017-08-27 DIAGNOSIS — I110 Hypertensive heart disease with heart failure: Secondary | ICD-10-CM | POA: Diagnosis not present

## 2017-08-27 DIAGNOSIS — Z66 Do not resuscitate: Secondary | ICD-10-CM | POA: Diagnosis present

## 2017-08-27 DIAGNOSIS — Z951 Presence of aortocoronary bypass graft: Secondary | ICD-10-CM

## 2017-08-27 DIAGNOSIS — I252 Old myocardial infarction: Secondary | ICD-10-CM | POA: Diagnosis not present

## 2017-08-27 DIAGNOSIS — R0902 Hypoxemia: Secondary | ICD-10-CM | POA: Diagnosis not present

## 2017-08-27 LAB — URINALYSIS, ROUTINE W REFLEX MICROSCOPIC
Bilirubin Urine: NEGATIVE
Glucose, UA: NEGATIVE mg/dL
Hgb urine dipstick: NEGATIVE
Ketones, ur: NEGATIVE mg/dL
Leukocytes, UA: NEGATIVE
Nitrite: NEGATIVE
Protein, ur: NEGATIVE mg/dL
Specific Gravity, Urine: 1.018 (ref 1.005–1.030)
pH: 6 (ref 5.0–8.0)

## 2017-08-27 LAB — CREATININE, SERUM
Creatinine, Ser: 1.12 mg/dL (ref 0.61–1.24)
GFR calc Af Amer: >60 mL/min (ref >60)
GFR calc non Af Amer: 57 mL/min — ABNORMAL LOW (ref >60)

## 2017-08-27 LAB — CBC
HCT: 28.5 % — ABNORMAL LOW (ref 39.0–52.0)
Hemoglobin: 8.6 g/dL — ABNORMAL LOW (ref 13.0–17.0)
MCH: 30 pg (ref 26.0–34.0)
MCHC: 30.2 g/dL (ref 30.0–36.0)
MCV: 99.3 fL (ref 78.0–100.0)
Platelets: 117 10*3/uL — ABNORMAL LOW (ref 150–400)
RBC: 2.87 MIL/uL — ABNORMAL LOW (ref 4.22–5.81)
RDW: 17.3 % — ABNORMAL HIGH (ref 11.5–15.5)
WBC: 7.1 10*3/uL (ref 4.0–10.5)

## 2017-08-27 LAB — BASIC METABOLIC PANEL
Anion gap: 5 (ref 5–15)
BUN: 33 mg/dL — ABNORMAL HIGH (ref 8–23)
CO2: 35 mmol/L — ABNORMAL HIGH (ref 22–32)
Calcium: 8 mg/dL — ABNORMAL LOW (ref 8.9–10.3)
Chloride: 101 mmol/L (ref 98–111)
Creatinine, Ser: 1.23 mg/dL (ref 0.61–1.24)
GFR calc Af Amer: 59 mL/min — ABNORMAL LOW (ref >60)
GFR calc non Af Amer: 51 mL/min — ABNORMAL LOW (ref >60)
Glucose, Bld: 110 mg/dL — ABNORMAL HIGH (ref 70–99)
Potassium: 4.8 mmol/L (ref 3.5–5.1)
Sodium: 141 mmol/L (ref 135–145)

## 2017-08-27 LAB — CBC WITH DIFFERENTIAL/PLATELET
Basophils Absolute: 0 10*3/uL (ref 0.0–0.1)
Basophils Relative: 0 %
Eosinophils Absolute: 0 10*3/uL (ref 0.0–0.7)
Eosinophils Relative: 0 %
HCT: 26.2 % — ABNORMAL LOW (ref 39.0–52.0)
Hemoglobin: 7.7 g/dL — ABNORMAL LOW (ref 13.0–17.0)
Lymphocytes Relative: 9 %
Lymphs Abs: 0.6 10*3/uL — ABNORMAL LOW (ref 0.7–4.0)
MCH: 29.4 pg (ref 26.0–34.0)
MCHC: 29.4 g/dL — ABNORMAL LOW (ref 30.0–36.0)
MCV: 100 fL (ref 78.0–100.0)
Monocytes Absolute: 0.4 10*3/uL (ref 0.1–1.0)
Monocytes Relative: 6 %
Neutro Abs: 5.3 10*3/uL (ref 1.7–7.7)
Neutrophils Relative %: 85 %
Platelets: 95 10*3/uL — ABNORMAL LOW (ref 150–400)
RBC: 2.62 MIL/uL — ABNORMAL LOW (ref 4.22–5.81)
RDW: 17.2 % — ABNORMAL HIGH (ref 11.5–15.5)
WBC: 6.3 10*3/uL (ref 4.0–10.5)

## 2017-08-27 LAB — TROPONIN I
Troponin I: 0.1 ng/mL (ref <0.03)
Troponin I: 0.1 ng/mL (ref <0.03)

## 2017-08-27 LAB — TSH: TSH: 5.177 u[IU]/mL — ABNORMAL HIGH (ref 0.350–4.500)

## 2017-08-27 LAB — D-DIMER, QUANTITATIVE (NOT AT ARMC): D-Dimer, Quant: 3.12 ug/mL-FEU — ABNORMAL HIGH (ref 0.00–0.50)

## 2017-08-27 LAB — BRAIN NATRIURETIC PEPTIDE: B Natriuretic Peptide: 661 pg/mL — ABNORMAL HIGH (ref 0.0–100.0)

## 2017-08-27 MED ORDER — IPRATROPIUM-ALBUTEROL 0.5-2.5 (3) MG/3ML IN SOLN
3.0000 mL | Freq: Three times a day (TID) | RESPIRATORY_TRACT | Status: DC
  Administered 2017-08-27 – 2017-08-30 (×9): 3 mL via RESPIRATORY_TRACT
  Filled 2017-08-27 (×9): qty 3

## 2017-08-27 MED ORDER — ENOXAPARIN SODIUM 40 MG/0.4ML ~~LOC~~ SOLN: 40.0000 mg | SUBCUTANEOUS | Status: DC

## 2017-08-27 MED ORDER — ASPIRIN 81 MG PO CHEW
81.0000 mg | CHEWABLE_TABLET | Freq: Every day | ORAL | Status: DC
  Administered 2017-08-28 – 2017-08-30 (×3): 81 mg via ORAL
  Filled 2017-08-27 (×3): qty 1

## 2017-08-27 MED ORDER — CALCIUM CARBONATE ANTACID 500 MG PO CHEW
1.0000 | CHEWABLE_TABLET | Freq: Every day | ORAL | Status: DC
Start: 2017-08-28 — End: 2017-08-30
  Administered 2017-08-28 – 2017-08-30 (×3): 200 mg via ORAL
  Filled 2017-08-27 (×3): qty 1

## 2017-08-27 MED ORDER — PREDNISONE 10 MG PO TABS
10.0000 mg | ORAL_TABLET | Freq: Every day | ORAL | Status: DC
  Administered 2017-08-28: 10 mg via ORAL
  Filled 2017-08-27: qty 1

## 2017-08-27 MED ORDER — ACETAMINOPHEN 325 MG PO TABS
650.0000 mg | ORAL_TABLET | ORAL | Status: DC | PRN
  Administered 2017-08-29: 650 mg via ORAL
  Filled 2017-08-27: qty 2

## 2017-08-27 MED ORDER — FUROSEMIDE 10 MG/ML IJ SOLN
20.0000 mg | Freq: Two times a day (BID) | INTRAMUSCULAR | Status: DC
  Administered 2017-08-28: 20 mg via INTRAVENOUS
  Filled 2017-08-27 (×2): qty 2

## 2017-08-27 MED ORDER — SODIUM CHLORIDE 0.9% FLUSH: 3.0000 mL | INTRAVENOUS | Status: DC | PRN

## 2017-08-27 MED ORDER — POLYSACCHARIDE IRON COMPLEX 150 MG PO CAPS
150.0000 mg | ORAL_CAPSULE | Freq: Every day | ORAL | Status: DC
  Administered 2017-08-28 – 2017-08-30 (×3): 150 mg via ORAL
  Filled 2017-08-27 (×3): qty 1

## 2017-08-27 MED ORDER — ALBUTEROL SULFATE (2.5 MG/3ML) 0.083% IN NEBU: 2.5000 mg | INHALATION_SOLUTION | RESPIRATORY_TRACT | Status: DC | PRN

## 2017-08-27 MED ORDER — SODIUM CHLORIDE 0.9 % IV SOLN: 250.0000 mL | INTRAVENOUS | Status: DC | PRN

## 2017-08-27 MED ORDER — FUROSEMIDE 10 MG/ML IJ SOLN
60.0000 mg | Freq: Once | INTRAMUSCULAR | Status: AC
  Administered 2017-08-27: 60 mg via INTRAVENOUS
  Filled 2017-08-27: qty 6

## 2017-08-27 MED ORDER — IOPAMIDOL (ISOVUE-370) INJECTION 76%
100.0000 mL | Freq: Once | INTRAVENOUS | Status: AC | PRN
  Administered 2017-08-27: 100 mL via INTRAVENOUS

## 2017-08-27 MED ORDER — DILTIAZEM HCL ER COATED BEADS 120 MG PO CP24
120.0000 mg | ORAL_CAPSULE | Freq: Every day | ORAL | Status: DC
  Administered 2017-08-28 – 2017-08-30 (×3): 120 mg via ORAL
  Filled 2017-08-27 (×3): qty 1

## 2017-08-27 MED ORDER — ONDANSETRON HCL 4 MG/2ML IJ SOLN: 4.0000 mg | Freq: Four times a day (QID) | INTRAMUSCULAR | Status: DC | PRN

## 2017-08-27 MED ORDER — ASPIRIN 81 MG PO CHEW
324.0000 mg | CHEWABLE_TABLET | Freq: Once | ORAL | Status: DC
  Filled 2017-08-27: qty 4

## 2017-08-27 MED ORDER — DOXAZOSIN MESYLATE 2 MG PO TABS
2.0000 mg | ORAL_TABLET | Freq: Every day | ORAL | Status: DC
  Administered 2017-08-28 – 2017-08-30 (×3): 2 mg via ORAL
  Filled 2017-08-27 (×3): qty 1

## 2017-08-27 MED ORDER — GABAPENTIN 100 MG PO CAPS
100.0000 mg | ORAL_CAPSULE | Freq: Two times a day (BID) | ORAL | Status: DC
  Administered 2017-08-28: 100 mg via ORAL
  Filled 2017-08-27: qty 1

## 2017-08-27 MED ORDER — SODIUM CHLORIDE 0.9% FLUSH
3.0000 mL | Freq: Two times a day (BID) | INTRAVENOUS | Status: DC
  Administered 2017-08-27 – 2017-08-30 (×6): 3 mL via INTRAVENOUS

## 2017-08-27 MED ORDER — CLONAZEPAM 0.5 MG PO TABS: 0.5000 mg | ORAL_TABLET | Freq: Three times a day (TID) | ORAL | Status: DC | PRN

## 2017-08-27 NOTE — ED Notes (Signed)
CRITICAL VALUE ALERT  Critical Value:  PCO2 60.7   Date & Time Notied:  08/27/17 1729  Provider Notified: Yes  Orders Received/Actions taken: No actions yet

## 2017-08-27 NOTE — ED Provider Notes (Addendum)
[facility] EMERGENCY DEPARTMENT Provider Note   CSN: [id number] Arrival date & time: 08/27/17  1327     History   Chief Complaint Chief Complaint  Patient presents with  . Shortness of Breath    HPI Steven [name] is a 84 y.o. male.  HPI   84 year old male sent from [place] facility for evaluation of hypoxemia.  Reportedly, his oxygen saturation was 56% this morning.  He was given 2 nebulizer treatments and referred to the emergency room.  Oxygen saturation on arrival was 97% on 3 L.  Apparently his initial blood pressure was 89 systolic and EMS started 683 cc of normal saline.  Patient has a history of dementia and is not a very good historian.  He does endorse a cough though and says that his shortness of breath is now better.  He denies any acute pain.  Recent admission for pneumonia.  Past medical history includes COPD, chronic kidney disease and hypertension.  Past Medical History:  Diagnosis Date  . Anxiety   . Cancer [facility]) 2013   Lung - cancer free x3 years.   . Hyperlipidemia   . Hypertension   . Myocardial infarction [facility]) 1999    Patient Active Problem List   Diagnosis Date Noted  . Sundowning 08/09/2017  . Community acquired pneumonia of right lower lobe of lung ([place])   . Goals of care, counseling/discussion   . Palliative care by specialist   . DNR (do not resuscitate) discussion   . Acute respiratory failure with hypoxia and hypercapnia ([place]) 08/07/2017  . PNA (pneumonia) 08/07/2017  . Abdominal bruit 10/15/2014  . CKD (chronic kidney disease), stage III ([place]) 09/28/2014  . BPH (benign prostatic hyperplasia) 02/25/2013  . Squamous cell carcinoma of lung ([place]) 11/13/2011  . COPD with emphysema ([place]) 07/29/2007  . Hyperlipidemia with target LDL less than 100 07/28/2007  . Anxiety state 07/28/2007  . ERECTILE DYSFUNCTION 07/28/2007  . Essential hypertension 07/28/2007  . Coronary atherosclerosis 07/28/2007    Past Surgical History:    Procedure Laterality Date  . CARPAL TUNNEL RELEASE  1980's   right and left  . CHOLECYSTECTOMY    . COLONOSCOPY N/A 09/29/2014   Procedure: COLONOSCOPY;  Surgeon: Rogene Houston, MD;  [facility];  Service: Endoscopy;  Laterality: N/A;  . CORONARY ARTERY BYPASS GRAFT  1999   5 grafts  . FLEXIBLE BRONCHOSCOPY  11/07/2011   Procedure: FLEXIBLE BRONCHOSCOPY;  Surgeon: Gaye Pollack, MD;  Location: [place];  Service: Thoracic;  Laterality: N/A;  . HERNIA REPAIR  [id number]   umbilical hernia repair  . JOINT REPLACEMENT  2000   right shoulder rotator cuff repair        Home Medications    Prior to Admission medications   Medication Sig Start Date End Date Taking? Authorizing Provider  albuterol (PROVENTIL HFA;VENTOLIN HFA) 108 (90 Base) MCG/ACT inhaler Inhale 1-2 puffs into the lungs every 4 (four) hours as needed for wheezing or shortness of breath. 03/18/16   Eber Jones, MD  albuterol (PROVENTIL) (2.5 MG/3ML) 0.083% nebulizer solution Take 3 mLs (2.5 mg total) by nebulization every 2 (two) hours as needed for wheezing or shortness of breath. 03/18/16   Eber Jones, MD  aspirin 81 MG tablet Take 81 mg by mouth daily.  07/19/15   [provider]  calcium carbonate (TUMS - DOSED IN MG ELEMENTAL CALCIUM) 500 MG chewable tablet Chew 1 tablet by mouth daily.    [provider]  clonazePAM (KLONOPIN) 1 MG tablet Take 0.5 tablets (0.5 mg total) by mouth 3 (three) times daily as needed. 08/15/17   Kathie Dike, MD  diltiazem (CARDIZEM CD) 120 MG 24 hr capsule Take 1 capsule (120 mg total) by mouth daily. 08/16/17   Kathie Dike, MD  doxazosin (CARDURA) 2 MG tablet Take 1 tablet (2 mg total) by mouth daily. 08/01/17   Hassell Done, Mary-Margaret, [name]  gabapentin (NEURONTIN) 100 MG capsule Take 1 capsule (100 mg total) by mouth 2 (two) times daily. 08/06/17   Hassell Done, Mary-Margaret, [name]  ipratropium-albuterol (DUONEB) 0.5-2.5 (3) MG/3ML SOLN Take 3 mLs by  nebulization 3 (three) times daily. 08/15/17   Kathie Dike, MD  iron polysaccharides (NIFEREX) 150 MG capsule Take 1 capsule (150 mg total) by mouth daily. 08/16/17   Kathie Dike, MD  Multiple Vitamins-Iron (MULTIVITAMINS WITH IRON) TABS tablet Take 1 tablet by mouth daily.    [provider]  predniSONE (DELTASONE) 10 MG tablet Take 40mg  po daily for 2 days then 30mg  daily for 2 days then 20mg  daily for 2 days then 10mg  daily for 2 days then stop 08/15/17   Kathie Dike, MD    Family History Family History  Problem Relation Age of Onset  . Cancer Mother   . Pulmonary embolism Father     Social History Social History   Tobacco Use  . Smoking status: Former Smoker    Packs/day: 2.00    Years: 73.00    Pack years: 146.00    Types: Cigarettes    Start date: 02/05/2006    Last attempt to quit: 11/05/2006    Years since quitting: 10.8  . Smokeless tobacco: Never Used  Substance Use Topics  . Alcohol use: No  . Drug use: No     Allergies   Patient has no known allergies.   Review of Systems Review of Systems Level 5 caveat because of dementia.  Physical Exam Updated Vital Signs BP (!) 149/57   Pulse 88   Temp 99.2 F (37.3 C) (Rectal)   Resp 14   Wt 61.2 kg (135 lb)   SpO2 99%   BMI 21.14 kg/m    Physical Exam  Constitutional: He appears well-developed.  Chronically ill-appearing.  Appears to be sleeping.  HENT:  Head: Normocephalic and atraumatic.  Eyes: Conjunctivae are normal. Right eye exhibits no discharge. Left eye exhibits no discharge.  Neck: Neck supple.  Cardiovascular: Normal rate, regular rhythm and normal heart sounds. Exam reveals no gallop and no friction rub.  No murmur heard. Pulmonary/Chest: Breath sounds normal. No respiratory distress.  l sided rales  Abdominal: Soft. He exhibits no distension. There is no tenderness.  Musculoskeletal: He exhibits edema. He exhibits no tenderness.  Mild pitting LE edema  Neurological:    Drowsy.  Opens his eyes to voice.  Does respond to questioning but his speech is somewhat hard to understand it.  the answers I can understand are appropriate for questions asked.  Skin: Skin is warm.  Nursing note and vitals reviewed.    ED Treatments / Results  Labs (all labs ordered are listed, but only abnormal results are displayed) Labs Reviewed  CBC WITH DIFFERENTIAL/PLATELET - Abnormal; Notable for the following components:      Result Value   RBC 2.62 (*)    Hemoglobin 7.7 (*)    HCT 26.2 (*)    MCHC 29.4 (*)    RDW 17.2 (*)    Platelets 95 (*)    Lymphs Abs 0.6 (*)  All other components within normal limits  TROPONIN I - Abnormal; Notable for the following components:   Troponin I 0.10 (*)    All other components within normal limits  BRAIN NATRIURETIC PEPTIDE - Abnormal; Notable for the following components:   B Natriuretic Peptide 661.0 (*)    All other components within normal limits  BASIC METABOLIC PANEL - Abnormal; Notable for the following components:   CO2 35 (*)    Glucose, Bld 110 (*)    BUN 33 (*)    Calcium 8.0 (*)    GFR calc non Af Amer 51 (*)    GFR calc Af Amer 59 (*)    All other components within normal limits  BLOOD GAS, ARTERIAL - Abnormal; Notable for the following components:   pCO2 arterial 60.7 (*)    Bicarbonate 33.8 (*)    Acid-Base Excess 10.6 (*)    All other components within normal limits  URINALYSIS, ROUTINE W REFLEX MICROSCOPIC    EKG EKG Interpretation  Date/Time:  Tuesday August 27 2017 13:39:05 EDT Ventricular Rate:  84 PR Interval:    QRS Duration: 91 QT Interval:  390 QTC Calculation: 461 R Axis:   77 Text Interpretation:  Sinus rhythm Atrial premature complex Nonspecific T abnormalities, lateral leads Baseline wander in lead(s) V2 V3 Confirmed by Virgel Manifold [phone]) on 08/27/2017 2:31:13 PM   Radiology Dg Chest Portable 1 View  Result Date: 08/27/2017 CLINICAL DATA:  Hypoxemia with productive cough. EXAM:  PORTABLE CHEST 1 VIEW COMPARISON:  08/07/2017 FINDINGS: Bilateral airspace opacity with small right pleural effusion and cephalized blood flow. Normal heart size. Status post CABG. IMPRESSION: CHF pattern. Electronically Signed   By: Monte Fantasia M.D.   On: 08/27/2017 14:42    Procedures Procedures (including critical care time)  CRITICAL CARE Performed by: Virgel Manifold Total critical care time: 35 minutes Critical care time was exclusive of separately billable procedures and treating other patients. Critical care was necessary to treat or prevent imminent or life-threatening deterioration. Critical care was time spent personally by me on the following activities: development of treatment plan with patient and/or surrogate as well as nursing, discussions with consultants, evaluation of patient's response to treatment, examination of patient, obtaining history from patient or surrogate, ordering and performing treatments and interventions, ordering and review of laboratory studies, ordering and review of radiographic studies, pulse oximetry and re-evaluation of patient's condition.   Medications Ordered in ED Medications - No data to display   Initial Impression / Assessment and Plan / ED Course  I have reviewed the triage vital signs and the nursing notes.  Pertinent labs & imaging results that were available during my care of the patient were reviewed by me and considered in my medical decision making (see chart for details).     84 year old male with reported hypoxemia earlier today.  He is now in the high 90s on 2 L of nasal cannula.  Possibly A. fib prior to arrival which I cannot verify.  He is in a sinus rhythm here.  Does not appear that he has a past history of A. fib.  Also reportedly hypotensive with a systolic blood pressure of 89.  Question the validity of this measurement.  Normotensive to mild hypertension only after receiving a few hundred cc of saline.  He is afebrile.   He looks a chronically ill, but not acutely distressed.  EKG without overt ischemic changes.  Will check chest x-ray, labs and urinalysis.  Anemia appears to be close  to baseline.  New thrombocytopenia which is probably not clinically relevant to current presentation.  Mildly elevated troponin.  EKG does not look sniffily changed from priors.  He does have a history of CAD.  He is not a reliable historian but, but it is worth, he denies any chest pain.  Chest x-ray is consistent with heart failure and BNP is elevated.  His blood pressure has been stable throughout his ED stay.  Lasix was ordered.  Aspirin.  While in the ER he progressive became more somnolent.  ABG was ordered.  PCO2 was elevated over 60.  We will start him on BiPAP.    Son and daughter-in-law were present for part of his ED stay. Confirm DNR. At this time would like readily correctable issues to be addressed. Admission.  Final Clinical Impressions(s) / ED Diagnoses   Final diagnoses:  Elevated troponin  Acute on chronic respiratory failure with hypoxia and hypercapnia [facility])    ED Discharge Orders    None      Virgel Manifold, MD 08/27/17 1739  Virgel Manifold, MD 09/26/17 [phone]

## 2017-08-27 NOTE — H&P (Signed)
History and Physical    Steven [name]:[id number] DOB: Jul 29, 1928 DOA: 08/27/2017  PCP: Steven Corrigan, MD ( Patient coming from: NH  I have personally briefly reviewed patient's old medical records in [place]  Chief Complaint: hypoxia  HPI: Steven [name] is a 84 y.o. male with medical history significant of dementia, copd presents with hypoxia from NH.  Per report patient arrived with a document sat of 56% this morning at the nursing facility.  Sent over to the emergency room.  When he arrived here he was set at 9 7% on 3 L.  At some point his blood pressure was 89 systolic and EMS gave 355 cc normal saline bolus.  Pressure has been stable here.  She has a history of dementia and is unable to give me any history personally.  He was admitted to the hospital July 3 for COPD exacerbation and acute respiratory failure with hypoxia hypercapnia.  At that time he was noted to have dysphasia with high risk for aspiration and dementia with behavioral disturbances. ED Course: Discussed with Dr Wilson Singer.  Chest x-ray showed changes suggestive of heart failure.  His BNP was elevated.  He was given Lasix and aspirin in the ED.  Over his stay in the ED he became more somnolent and ABG was done.  His CO2 was elevated at 60 so they placed him on BiPAP.  ED physician spoke with family who confirmed DNR status.  Review of Systems:limited due to patient mental status/ and Bipap mask  Past Medical History:  Diagnosis Date  . Anxiety   . Cancer [facility]) 2013   Lung - cancer free x3 years.   . Hyperlipidemia   . Hypertension   . Myocardial infarction [facility]) Dementia COPD  1999    Past Surgical History:  Procedure Laterality Date  . CARPAL TUNNEL RELEASE  1980's   right and left  . CHOLECYSTECTOMY    . COLONOSCOPY N/A 09/29/2014   Procedure: COLONOSCOPY;  Surgeon: Rogene Houston, MD;  [facility];  Service: Endoscopy;  Laterality: N/A;  . CORONARY ARTERY BYPASS GRAFT  1999   5 grafts  . FLEXIBLE BRONCHOSCOPY  11/07/2011   Procedure: FLEXIBLE BRONCHOSCOPY;  Surgeon: Gaye Pollack, MD;  Location: [place];  Service: Thoracic;  Laterality: N/A;  . HERNIA REPAIR  7322   umbilical hernia repair  . JOINT REPLACEMENT  2000   right shoulder rotator cuff repair     reports that he quit smoking about 10 years ago. His smoking use included cigarettes. He started smoking about 11 years ago. He has a 146.00 pack-year smoking history. He has never used smokeless tobacco. He reports that he does not drink alcohol or use drugs.  No Known Allergies  Family History  Problem Relation Age of Onset  . Cancer Mother   . Pulmonary embolism Father   reviewed and noncontributory   Prior to Admission medications   Medication Sig Start Date End Date Taking? Authorizing Provider  albuterol (PROVENTIL) (2.5 MG/3ML) 0.083% nebulizer solution Take 3 mLs (2.5 mg total) by nebulization every 2 (two) hours as needed for wheezing or shortness of breath. 03/18/16  Yes Eber Jones, MD  aspirin 81 MG tablet Take 81 mg by mouth daily.  07/19/15  Yes [provider]  calcium carbonate (TUMS - DOSED IN MG ELEMENTAL CALCIUM) 500 MG chewable tablet Chew 1 tablet by mouth daily.   Yes [provider]  clonazePAM (KLONOPIN) 1 MG tablet Take  0.5 tablets (0.5 mg total) by mouth 3 (three) times daily as needed. 08/15/17  Yes Kathie Dike, MD  doxazosin (CARDURA) 2 MG tablet Take 1 tablet (2 mg total) by mouth daily. 08/01/17  Yes Martin, Mary-Margaret, [name]  gabapentin (NEURONTIN) 100 MG capsule Take 1 capsule (100 mg total) by mouth 2 (two) times daily. 08/06/17  Yes Martin, Mary-Margaret, [name]  ipratropium-albuterol (DUONEB) 0.5-2.5 (3) MG/3ML SOLN Take 3 mLs by nebulization 3 (three) times daily. 08/15/17  Yes Kathie Dike, MD  iron polysaccharides (NIFEREX) 150 MG capsule Take 1 capsule (150 mg total) by mouth daily. 08/16/17  Yes Kathie Dike, MD  predniSONE (DELTASONE) 10 MG  tablet Take 40mg  po daily for 2 days then 30mg  daily for 2 days then 20mg  daily for 2 days then 10mg  daily for 2 days then stop 08/15/17  Yes Memon, Jolaine Artist, MD  diltiazem (CARDIZEM CD) 120 MG 24 hr capsule Take 1 capsule (120 mg total) by mouth daily. 08/16/17   Kathie Dike, MD    Physical Exam: Vitals:   08/27/17 1730 08/27/17 1800 08/27/17 1812 08/27/17 1830  BP: (!) 157/73 (!) 117/101  (!) 154/69  Pulse: 67 86 94 86  Resp: 16 19 13 18   Temp:      TempSrc:      SpO2: 99% 99% 99% 99%  Weight:        Constitutional: NAD, calm, comfortable Vitals:   08/27/17 1730 08/27/17 1800 08/27/17 1812 08/27/17 1830  BP: (!) 157/73 (!) 117/101  (!) 154/69  Pulse: 67 86 94 86  Resp: 16 19 13 18   Temp:      TempSrc:      SpO2: 99% 99% 99% 99%  Weight:       Eyes: PERRL, lids and conjunctivae normal ENMT: Mucous membranes are dry BIPAP in place m Neck: normal, supple, no masses, no thyromegaly Respiratory: clear to auscultation bilaterally,  Normal respiratory effort. No accessory muscle use.  Cardiovascular: Regular rate and rhythm, 1+loweer  extremity edema. 2+ pedal pulses. Abdomen: no tenderness, no masses palpated.. Bowel sounds positive.  GU Foley in place  Musculoskeletal: no clubbing / cyanosis. , no contractures.  Skin: no obvious rashes, lesions, ulcers. No induration Neurologic: lethargic but follows simple commands, doesn't audibly answer questions  gen weak but move all ext on command .  Psychiatric: lethargic  Labs on Admission: I have personally reviewed following labs and imaging studies  CBC: Recent Labs  Lab 08/27/17 1437  WBC 6.3  NEUTROABS 5.3  HGB 7.7*  HCT 26.2*  MCV 100.0  PLT 95*   Basic Metabolic Panel: Recent Labs  Lab 08/27/17 1437  NA 141  K 4.8  CL 101  CO2 35*  GLUCOSE 110*  BUN 33*  CREATININE 1.23  CALCIUM 8.0*   GFR: Estimated Creatinine Clearance: 36.6 mL/min (by C-G formula based on SCr of 1.23 mg/dL). Liver Function Tests: No  results for input(s): AST, ALT, ALKPHOS, BILITOT, PROT, ALBUMIN in the last 168 hours. No results for input(s): LIPASE, AMYLASE in the last 168 hours. No results for input(s): AMMONIA in the last 168 hours. Coagulation Profile: No results for input(s): INR, PROTIME in the last 168 hours. Cardiac Enzymes: Recent Labs  Lab 08/27/17 1437  TROPONINI 0.10*   BNP (last 3 results) No results for input(s): PROBNP in the last 8760 hours. HbA1C: No results for input(s): HGBA1C in the last 72 hours. CBG: No results for input(s): GLUCAP in the last 168 hours. Lipid Profile: No results for input(s):  CHOL, HDL, LDLCALC, TRIG, CHOLHDL, LDLDIRECT in the last 72 hours. Thyroid Function Tests: No results for input(s): TSH, T4TOTAL, FREET4, T3FREE, THYROIDAB in the last 72 hours. Anemia Panel: No results for input(s): VITAMINB12, FOLATE, FERRITIN, TIBC, IRON, RETICCTPCT in the last 72 hours. Urine analysis:    Component Value Date/Time   COLORURINE YELLOW 08/27/2017 [place] 08/27/2017 1358   LABSPEC 1.018 08/27/2017 1358   PHURINE 6.0 08/27/2017 1358   GLUCOSEU NEGATIVE 08/27/2017 1358   HGBUR NEGATIVE 08/27/2017 [place] 08/27/2017 1358   KETONESUR NEGATIVE 08/27/2017 1358   PROTEINUR NEGATIVE 08/27/2017 1358   UROBILINOGEN 0.2 11/05/2011 1456   NITRITE NEGATIVE 08/27/2017 1358   LEUKOCYTESUR NEGATIVE 08/27/2017 1358    Radiological Exams on Admission: Dg Chest Portable 1 View  Result Date: 08/27/2017 CLINICAL DATA:  Hypoxemia with productive cough. EXAM: PORTABLE CHEST 1 VIEW COMPARISON:  08/07/2017 FINDINGS: Bilateral airspace opacity with small right pleural effusion and cephalized blood flow. Normal heart size. Status post CABG. IMPRESSION: CHF pattern. Electronically Signed   By: Monte Fantasia M.D.   On: 08/27/2017 14:42    EKG: Independently reviewed. sr with PAC   Assessment/Plan Principal Problem:   CHF (congestive heart failure)  (HCC) Active Problems:   Essential hypertension   COPD with emphysema (HCC)   CKD (chronic kidney disease), stage III (HCC)   Acute respiratory failure with hypoxia and hypercapnia (HCC)   Dementia   Chronic anemia    - admit to telemetry, IV Lasix, follow ins and outs and daily weights,  Cycle cardiac enzymes.  Ween Supplemental oxygen to maintain oxygen sats,( patient's hypercapnia appears chronic and he is well compensated, will D/c Bipap) check TSH, check 2D echo. Continue Asa -Pulmonary toilet -Chronic kidney disease near baseline -Chronic anemia at baseline 7.4-8.2 -Continue home meds for dementia and anxiety -Dysphagia diet when tolerated    DVT prophylaxis: SCDs due to chronic anemia  unknown etiology Code Status: DNR  Disposition Plan: Expect discharge back to nursing facility within  2 -3 days  Admission status: Inpatient telemetry  Bellina Tokarczyk Johnson-Pitts MD [facility] Pager [phone]  If 7PM-7AM, please contact night-coverage [email or web address] Password [facility]  08/27/2017, 9:03 PM

## 2017-08-27 NOTE — ED Triage Notes (Signed)
Pt coming from [facility]. Per facility, pts O2 sats were 56% this morning. Was given 2 nebulizing treatments at home. After LPN did not say if O2 went up. Pt now on 3L O2 with sats 97%. Afib on the monitor with rate under 100. BP hypotensive with 89 systolic. Started on fluids by EMS. 500 mL bolus going now. CBG 180 and axillary temp 98.2.  Congested cough that is non productive

## 2017-08-27 NOTE — ED Notes (Signed)
Have paged respiratory to bring down bipap as well as confirmed with Dr. Wilson Singer

## 2017-08-27 NOTE — ED Notes (Signed)
CRITICAL VALUE ALERT  Critical Value:  Troponin 0.10   Date & Time Notied: 08/27/17 1528  Provider Notified: Yes  Orders Received/Actions taken: No orders yet

## 2017-08-28 ENCOUNTER — Inpatient Hospital Stay (HOSPITAL_COMMUNITY): Payer: Medicare HMO

## 2017-08-28 ENCOUNTER — Encounter (HOSPITAL_COMMUNITY): Payer: Self-pay

## 2017-08-28 DIAGNOSIS — J9622 Acute and chronic respiratory failure with hypercapnia: Secondary | ICD-10-CM

## 2017-08-28 DIAGNOSIS — R748 Abnormal levels of other serum enzymes: Secondary | ICD-10-CM

## 2017-08-28 DIAGNOSIS — I509 Heart failure, unspecified: Secondary | ICD-10-CM

## 2017-08-28 DIAGNOSIS — J9621 Acute and chronic respiratory failure with hypoxia: Secondary | ICD-10-CM

## 2017-08-28 LAB — TROPONIN I: Troponin I: 0.07 ng/mL (ref <0.03)

## 2017-08-28 LAB — BLOOD GAS, ARTERIAL
Acid-Base Excess: 10.6 mmol/L — ABNORMAL HIGH (ref 0.0–2.0)
Bicarbonate: 33.8 mmol/L — ABNORMAL HIGH (ref 20.0–28.0)
Drawn by: 234301
O2 Content: 2 L/min
O2 Saturation: 96.2 %
pCO2 arterial: 60.7 mmHg — ABNORMAL HIGH (ref 32.0–48.0)
pH, Arterial: 7.389 (ref 7.350–7.450)
pO2, Arterial: 83.3 mmHg (ref 83.0–108.0)

## 2017-08-28 LAB — BASIC METABOLIC PANEL
Anion gap: 7 (ref 5–15)
BUN: 30 mg/dL — ABNORMAL HIGH (ref 8–23)
CO2: 39 mmol/L — ABNORMAL HIGH (ref 22–32)
Calcium: 8.4 mg/dL — ABNORMAL LOW (ref 8.9–10.3)
Chloride: 97 mmol/L — ABNORMAL LOW (ref 98–111)
Creatinine, Ser: 1.18 mg/dL (ref 0.61–1.24)
GFR calc Af Amer: >60 mL/min (ref >60)
GFR calc non Af Amer: 54 mL/min — ABNORMAL LOW (ref >60)
Glucose, Bld: 102 mg/dL — ABNORMAL HIGH (ref 70–99)
Potassium: 5 mmol/L (ref 3.5–5.1)
Sodium: 143 mmol/L (ref 135–145)

## 2017-08-28 LAB — ECHOCARDIOGRAM COMPLETE: Weight: 2194.02 oz

## 2017-08-28 MED ORDER — FUROSEMIDE 10 MG/ML IJ SOLN
40.0000 mg | Freq: Two times a day (BID) | INTRAMUSCULAR | Status: DC
  Administered 2017-08-28 – 2017-08-29 (×2): 40 mg via INTRAVENOUS
  Filled 2017-08-28: qty 4

## 2017-08-28 NOTE — Progress Notes (Signed)
  Echocardiogram 2D Echocardiogram has been performed.  Steven [name] 08/28/2017, 2:31 PM

## 2017-08-28 NOTE — Progress Notes (Signed)
Patient blood pressure 142/34. Patient asymptomatic, sitting in bed talking with nursing staff. Patient offers no complaints and shows no signs of distress nor discomfort. Will continue to monitor.

## 2017-08-28 NOTE — Progress Notes (Signed)
PROGRESS NOTE    Steven [name]  IZT:[id number] DOB: 1928/09/24 DOA: 08/27/2017 PCP: Hilbert Corrigan, MD    Brief Narrative:  84 y/o male with history of COPD, dementia, dysphagia who is at a SNF, was brought to ED with worsening hypoxia. Found to be in decompensated CHF and started on IV lasix. Initially placed on Bipap in ED, but then improved and now is on nasal cannula. Plan will be to return to SNF once adequately diuresed.   Assessment & Plan:   Principal Problem:   CHF (congestive heart failure) (HCC) Active Problems:   Essential hypertension   COPD with emphysema (HCC)   CKD (chronic kidney disease), stage III (HCC)   Acute respiratory failure with hypoxia and hypercapnia (HCC)   Dementia   Chronic anemia   1. Acute on chronic diastolic CHF. Patient has evidence of volume overload with peripheral edema and pleural effusions. He has been started on IV lasix. Echo shows preserved EF. Will continue with diuresis 2. CKD stage 3. Creatinine is currently at baseline. Continue to follow with diuresis 3. Elevated troponin. No chest pain or EKG changes. Suspect trop leak in decompensated CHF 4. Acute respiratory failure withy hypoxia and hypercapnia. Was briefly placed on Bipap on arrival to emergency room. ABG shows elevated pc02 with compensated pH. Currently on 2L oxygen with improved mental status. Continue to follow 5. Anemia. Chronic. Hemoglobin has been stable. Continue to follow. No signs of bleeding 6. COPD. No wheezing. Continue bronchodilators as needed 7. Anxiety. Continue klonopin as needed 8. Dysphagia. On modified diet with puree and honey thick liquids 9. HTN. Stable on diltiazem   DVT prophylaxis: SCDs Code Status: DNR Family Communication: discussed with son at the bedside Disposition Plan: possible placement on discharge   Consultants:     Procedures:  Echo: - Left ventricle: Images are limited. The cavity size was normal.   Wall thickness was  increased in a pattern of mild LVH. Systolic   function was vigorous. The estimated ejection fraction was in the   range of 65% to 70%. Indeterminate diastolic function. - Aortic valve: Poorly visualized. Moderately calcified leaflets.   Peak gradient (S): 7 mm Hg. - Mitral valve: Mildly calcified annulus. There was trivial   regurgitation. - Right atrium: Central venous pressure (est): 3 mm Hg. - Atrial septum: No defect or patent foramen ovale was identified. - Tricuspid valve: There was trivial regurgitation. - Pulmonary arteries: Systolic pressure could not be accurately   estimated.  - Pericardium, extracardiac: There was no pericardial effusion.  Antimicrobials:       Subjective: Shortness of breath improving, no chest pain  Objective: Vitals:   08/28/17 0646 08/28/17 0823 08/28/17 1342 08/28/17 1439  BP: (!) 154/64  (!) 142/34   Pulse: 88  82   Resp: 12  20   Temp: 98.1 F (36.7 C)  99 F (37.2 C)   TempSrc: Oral     SpO2: 100% 90% 95% 96%  Weight:        Intake/Output Summary (Last 24 hours) at 08/28/2017 1840 Last data filed at 08/28/2017 1200 Gross per 24 hour  Intake 240 ml  Output 600 ml  Net -360 ml   Filed Weights   08/27/17 1338 08/27/17 1911 08/28/17 0500  Weight: 61.2 kg (135 lb) 59.7 kg (131 lb 9.8 oz) 62.2 kg (137 lb 2 oz)    Examination:  General exam: Appears calm and comfortable  Respiratory system: Crackles at bases. Respiratory effort normal. Cardiovascular system:  S1 & S2 heard, RRR. No JVD, murmurs, rubs, gallops or clicks. 1+ pedal edema. Gastrointestinal system: Abdomen is nondistended, soft and nontender. No organomegaly or masses felt. Normal bowel sounds heard. Central nervous system: Alert and oriented. No focal neurological deficits. Extremities: Symmetric 5 x 5 power. Skin: No rashes, lesions or ulcers Psychiatry: Judgement and insight appear normal. Mood & affect appropriate.     Data Reviewed: I have personally reviewed  following labs and imaging studies  CBC: Recent Labs  Lab 08/27/17 1437 08/27/17 1925  WBC 6.3 7.1  NEUTROABS 5.3  --   HGB 7.7* 8.6*  HCT 26.2* 28.5*  MCV 100.0 99.3  PLT 95* 347*   Basic Metabolic Panel: Recent Labs  Lab 08/27/17 1437 08/27/17 1925 08/28/17 0440  NA 141  --  143  K 4.8  --  5.0  CL 101  --  97*  CO2 35*  --  39*  GLUCOSE 110*  --  102*  BUN 33*  --  30*  CREATININE 1.23 1.12 1.18  CALCIUM 8.0*  --  8.4*   GFR: Estimated Creatinine Clearance: 38.8 mL/min (by C-G formula based on SCr of 1.18 mg/dL). Liver Function Tests: No results for input(s): AST, ALT, ALKPHOS, BILITOT, PROT, ALBUMIN in the last 168 hours. No results for input(s): LIPASE, AMYLASE in the last 168 hours. No results for input(s): AMMONIA in the last 168 hours. Coagulation Profile: No results for input(s): INR, PROTIME in the last 168 hours. Cardiac Enzymes: Recent Labs  Lab 08/27/17 1437 08/27/17 1925 08/28/17 0111  TROPONINI 0.10* 0.10* 0.07*   BNP (last 3 results) No results for input(s): PROBNP in the last 8760 hours. HbA1C: No results for input(s): HGBA1C in the last 72 hours. CBG: No results for input(s): GLUCAP in the last 168 hours. Lipid Profile: No results for input(s): CHOL, HDL, LDLCALC, TRIG, CHOLHDL, LDLDIRECT in the last 72 hours. Thyroid Function Tests: Recent Labs    08/27/17 1925  TSH 5.177*   Anemia Panel: No results for input(s): VITAMINB12, FOLATE, FERRITIN, TIBC, IRON, RETICCTPCT in the last 72 hours. Sepsis Labs: No results for input(s): PROCALCITON, LATICACIDVEN in the last 168 hours.  No results found for this or any previous visit (from the past 240 hour(s)).       Radiology Studies: Ct Angio Chest Pe W Or Wo Contrast  Result Date: 08/27/2017 CLINICAL DATA:  Hypoxia today. History of hypertension, lung cancer. EXAM: CT ANGIOGRAPHY CHEST WITH CONTRAST TECHNIQUE: Multidetector CT imaging of the chest was performed using the standard  protocol during bolus administration of intravenous contrast. Multiplanar CT image reconstructions and MIPs were obtained to evaluate the vascular anatomy. CONTRAST:  135mL ISOVUE-370 IOPAMIDOL (ISOVUE-370) INJECTION 76% COMPARISON:  12/02/2013 FINDINGS: Cardiovascular: There is good opacification of the central and segmental pulmonary arteries. No focal filling defects. No evidence of significant pulmonary embolus. Cardiac enlargement with reflux of contrast material into the hepatic veins suggesting right heart failure. No pericardial effusion. Postoperative changes consistent with coronary bypass. Normal caliber thoracic aorta with scattered calcification. No aortic dissection. Great vessel origins are patent. Mediastinum/Nodes: No significant lymphadenopathy in the chest. Esophagus is decompressed. Small esophageal hiatal hernia. Lungs/Pleura: Motion artifact limits examination. There are moderate bilateral pleural effusions with bilateral basilar atelectasis or consolidation, greater on the right. Hazy perihilar infiltrates likely indicate edema. Mild emphysematous changes in the upper lungs. Peripheral fibrosis. Nodule in the left upper lung measuring 4 mm is unchanged since previous study. Additional nodule seen previously are not well demonstrated  today. This is likely technical. Upper Abdomen: No acute process demonstrated on visualized upper abdominal structures. Musculoskeletal: Degenerative changes in the spine. No destructive bone lesions. Postoperative sternotomy wires. Review of the MIP images confirms the above findings. IMPRESSION: 1. No evidence of significant pulmonary embolus. 2. Cardiac enlargement with reflux of contrast material into the hepatic veins suggesting right heart failure. 3. Moderate bilateral pleural effusions with basilar atelectasis or consolidation, greater on the right. 4. Mild emphysematous changes in the upper lungs. 4 mm nodule in the left upper lung is unchanged since  previous study, likely benign. Aortic Atherosclerosis (ICD10-I70.0) and Emphysema (ICD10-J43.9). Electronically Signed   By: Lucienne Capers M.D.   On: 08/27/2017 23:54   Dg Chest Portable 1 View  Result Date: 08/27/2017 CLINICAL DATA:  Hypoxemia with productive cough. EXAM: PORTABLE CHEST 1 VIEW COMPARISON:  08/07/2017 FINDINGS: Bilateral airspace opacity with small right pleural effusion and cephalized blood flow. Normal heart size. Status post CABG. IMPRESSION: CHF pattern. Electronically Signed   By: Monte Fantasia M.D.   On: 08/27/2017 14:42        Scheduled Meds: . aspirin  324 mg Oral Once  . aspirin  81 mg Oral Daily  . calcium carbonate  1 tablet Oral Daily  . diltiazem  120 mg Oral Daily  . doxazosin  2 mg Oral Daily  . furosemide  40 mg Intravenous Q12H  . ipratropium-albuterol  3 mL Nebulization TID  . iron polysaccharides  150 mg Oral Daily  . sodium chloride flush  3 mL Intravenous Q12H   Continuous Infusions: . sodium chloride       LOS: 1 day    Time spent: 56mins    Kathie Dike, MD [facility] Pager [phone]  If 7PM-7AM, please contact night-coverage [email or web address] Password [facility] 08/28/2017, 6:40 PM

## 2017-08-29 ENCOUNTER — Inpatient Hospital Stay (HOSPITAL_COMMUNITY): Payer: Medicare HMO

## 2017-08-29 DIAGNOSIS — J439 Emphysema, unspecified: Secondary | ICD-10-CM

## 2017-08-29 DIAGNOSIS — I10 Essential (primary) hypertension: Secondary | ICD-10-CM

## 2017-08-29 DIAGNOSIS — J9602 Acute respiratory failure with hypercapnia: Secondary | ICD-10-CM

## 2017-08-29 DIAGNOSIS — F0391 Unspecified dementia with behavioral disturbance: Secondary | ICD-10-CM

## 2017-08-29 DIAGNOSIS — N183 Chronic kidney disease, stage 3 (moderate): Secondary | ICD-10-CM

## 2017-08-29 DIAGNOSIS — D649 Anemia, unspecified: Secondary | ICD-10-CM

## 2017-08-29 DIAGNOSIS — J9601 Acute respiratory failure with hypoxia: Secondary | ICD-10-CM

## 2017-08-29 DIAGNOSIS — I509 Heart failure, unspecified: Secondary | ICD-10-CM

## 2017-08-29 LAB — BASIC METABOLIC PANEL
Anion gap: 6 (ref 5–15)
BUN: 31 mg/dL — ABNORMAL HIGH (ref 8–23)
CO2: 36 mmol/L — ABNORMAL HIGH (ref 22–32)
Calcium: 8.1 mg/dL — ABNORMAL LOW (ref 8.9–10.3)
Chloride: 98 mmol/L (ref 98–111)
Creatinine, Ser: 1.21 mg/dL (ref 0.61–1.24)
GFR calc Af Amer: >60 mL/min (ref >60)
GFR calc non Af Amer: 52 mL/min — ABNORMAL LOW (ref >60)
Glucose, Bld: 113 mg/dL — ABNORMAL HIGH (ref 70–99)
Potassium: 3.8 mmol/L (ref 3.5–5.1)
Sodium: 140 mmol/L (ref 135–145)

## 2017-08-29 LAB — CBC
HCT: 27.2 % — ABNORMAL LOW (ref 39.0–52.0)
Hemoglobin: 8.3 g/dL — ABNORMAL LOW (ref 13.0–17.0)
MCH: 28.9 pg (ref 26.0–34.0)
MCHC: 30.5 g/dL (ref 30.0–36.0)
MCV: 94.8 fL (ref 78.0–100.0)
Platelets: 109 10*3/uL — ABNORMAL LOW (ref 150–400)
RBC: 2.87 MIL/uL — ABNORMAL LOW (ref 4.22–5.81)
RDW: 17.5 % — ABNORMAL HIGH (ref 11.5–15.5)
WBC: 6.8 10*3/uL (ref 4.0–10.5)

## 2017-08-29 MED ORDER — FUROSEMIDE 10 MG/ML IJ SOLN
40.0000 mg | Freq: Every day | INTRAMUSCULAR | Status: DC
  Administered 2017-08-30: 40 mg via INTRAVENOUS
  Filled 2017-08-29: qty 4

## 2017-08-29 MED ORDER — SODIUM CHLORIDE 0.9 % IV SOLN
1.5000 g | Freq: Four times a day (QID) | INTRAVENOUS | Status: DC
  Administered 2017-08-29 – 2017-08-30 (×4): 1.5 g via INTRAVENOUS
  Filled 2017-08-29 (×17): qty 1.5

## 2017-08-29 NOTE — Plan of Care (Signed)
  Problem: Acute Rehab PT Goals(only PT should resolve) Goal: Pt Will Go Supine/Side To Sit Outcome: Progressing Flowsheets (Taken 08/29/2017 1411) Pt will go Supine/Side to Sit: with modified independence Goal: Patient Will Transfer Sit To/From Stand Outcome: Progressing Flowsheets (Taken 08/29/2017 1411) Patient will transfer sit to/from stand: with supervision Goal: Pt Will Transfer Bed To Chair/Chair To Bed Outcome: Progressing Flowsheets (Taken 08/29/2017 1411) Pt will Transfer Bed to Chair/Chair to Bed: min guard assist Goal: Pt Will Ambulate Outcome: Progressing Flowsheets (Taken 08/29/2017 1411) Pt will Ambulate: 50 feet;with min guard assist;with rolling walker   2:12 PM, 08/29/17 Lonell Grandchild, MPT Physical Therapist with [facility] [phone] office [phone] mobile phone

## 2017-08-29 NOTE — Progress Notes (Signed)
PROGRESS NOTE    Steven [name]  ZYS:[id number] DOB: [birth year] DOA: 08/27/2017 PCP: Hilbert Corrigan, MD    Brief Narrative:  84 y/o male with history of COPD, dementia, dysphagia who is at a SNF, was brought to ED with worsening hypoxia. Found to be in decompensated CHF and started on IV lasix. Initially placed on Bipap in ED, but then improved and now is on nasal cannula. Plan will be to return to SNF once adequately diuresed.   Assessment & Plan:   Principal Problem:   CHF (congestive heart failure) (HCC) Active Problems:   Essential hypertension   COPD with emphysema (HCC)   CKD (chronic kidney disease), stage III (HCC)   Acute respiratory failure with hypoxia and hypercapnia (HCC)   Dementia   Chronic anemia   1. Acute on chronic diastolic CHF. Patient has evidence of volume overload with peripheral edema and pleural effusions. He has been started on IV lasix. He is diuresing very well.  Echo shows preserved EF. Will continue with diuresis but reduce frequency to daily.   2. Aspiration pneumonia - begin IV unasyn, ask SLP to evaluate his swallow function.  3. CKD stage 3. Creatinine is currently at baseline. Continue to follow with diuresis 4. Elevated troponin. No chest pain or EKG changes. Suspect trop leak in decompensated CHF 5. Acute respiratory failure withy hypoxia and hypercapnia. Was briefly placed on Bipap on arrival to emergency room. ABG shows elevated pc02 with compensated pH. Currently on 2L oxygen with improved mental status. Continue to follow.   6. Anemia of chronic disease. Hemoglobin has been stable at 8. Continue to follow.  7. COPD. No wheezing. Continue bronchodilators as needed. 8. Anxiety. Continue klonopin as needed.  9. Dysphagia. On modified diet with puree and honey thick liquids 10. HTN. Stable on diltiazem.    DVT prophylaxis: SCDs Code Status: DNR Family Communication: discussed with son at the bedside Disposition Plan: PT eval pending  for disposition   Consultants:     Procedures:  Echo: - Left ventricle: Images are limited. The cavity size was normal.   Wall thickness was increased in a pattern of mild LVH. Systolic   function was vigorous. The estimated ejection fraction was in the   range of 65% to 70%. Indeterminate diastolic function. - Aortic valve: Poorly visualized. Moderately calcified leaflets.   Peak gradient (S): 7 mm Hg. - Mitral valve: Mildly calcified annulus. There was trivial   regurgitation. - Right atrium: Central venous pressure (est): 3 mm Hg. - Atrial septum: No defect or patent foramen ovale was identified. - Tricuspid valve: There was trivial regurgitation. - Pulmonary arteries: Systolic pressure could not be accurately   estimated.  - Pericardium, extracardiac: There was no pericardial effusion.   Subjective: Pt has continued to diurese well.   Objective: Vitals:   08/28/17 2107 08/28/17 2200 08/29/17 0558 08/29/17 0724  BP: (!) 108/31 (!) 123/43 (!) 153/48   Pulse: 96 86 67   Resp:      Temp: 98.8 F (37.1 C)  98.7 F (37.1 C)   TempSrc: Oral  Oral   SpO2: 97%  98% 91%  Weight:   63.4 kg (139 lb 12.4 oz)     Intake/Output Summary (Last 24 hours) at 08/29/2017 1149 Last data filed at 08/29/2017 1015 Gross per 24 hour  Intake 360 ml  Output 4250 ml  Net -3890 ml   Filed Weights   08/27/17 1911 08/28/17 0500 08/29/17 0558  Weight: 59.7 kg (131 lb  9.8 oz) 62.2 kg (137 lb 2 oz) 63.4 kg (139 lb 12.4 oz)   Examination:  General exam: Appears calm and comfortable  Respiratory system: Crackles at bases. Respiratory effort normal. Cardiovascular system: S1 & S2 heard.  Gastrointestinal system: Abdomen is nondistended, soft and nontender. No organomegaly or masses felt. Normal bowel sounds heard. Central nervous system: Alert and oriented. No focal neurological deficits. Extremities: Symmetric 5 x 5 power. Trace pretibial edema BLEs.  Skin: No rashes, lesions or  ulcers Psychiatry: Judgement and insight appear normal. Mood & affect appropriate.    Data Reviewed: I have personally reviewed following labs and imaging studies  CBC: Recent Labs  Lab 08/27/17 1437 08/27/17 1925 08/29/17 0448  WBC 6.3 7.1 6.8  NEUTROABS 5.3  --   --   HGB 7.7* 8.6* 8.3*  HCT 26.2* 28.5* 27.2*  MCV 100.0 99.3 94.8  PLT 95* 117* 983*   Basic Metabolic Panel: Recent Labs  Lab 08/27/17 1437 08/27/17 1925 08/28/17 0440 08/29/17 0448  NA 141  --  143 140  K 4.8  --  5.0 3.8  CL 101  --  97* 98  CO2 35*  --  39* 36*  GLUCOSE 110*  --  102* 113*  BUN 33*  --  30* 31*  CREATININE 1.23 1.12 1.18 1.21  CALCIUM 8.0*  --  8.4* 8.1*   GFR: Estimated Creatinine Clearance: 38.6 mL/min (by C-G formula based on SCr of 1.21 mg/dL). Liver Function Tests: No results for input(s): AST, ALT, ALKPHOS, BILITOT, PROT, ALBUMIN in the last 168 hours. No results for input(s): LIPASE, AMYLASE in the last 168 hours. No results for input(s): AMMONIA in the last 168 hours. Coagulation Profile: No results for input(s): INR, PROTIME in the last 168 hours. Cardiac Enzymes: Recent Labs  Lab 08/27/17 1437 08/27/17 1925 08/28/17 0111  TROPONINI 0.10* 0.10* 0.07*   BNP (last 3 results) No results for input(s): PROBNP in the last 8760 hours. HbA1C: No results for input(s): HGBA1C in the last 72 hours. CBG: No results for input(s): GLUCAP in the last 168 hours. Lipid Profile: No results for input(s): CHOL, HDL, LDLCALC, TRIG, CHOLHDL, LDLDIRECT in the last 72 hours. Thyroid Function Tests: Recent Labs    08/27/17 1925  TSH 5.177*   Anemia Panel: No results for input(s): VITAMINB12, FOLATE, FERRITIN, TIBC, IRON, RETICCTPCT in the last 72 hours. Sepsis Labs: No results for input(s): PROCALCITON, LATICACIDVEN in the last 168 hours.  No results found for this or any previous visit (from the past 240 hour(s)).       Radiology Studies: Ct Angio Chest Pe W Or Wo  Contrast  Result Date: 08/27/2017 CLINICAL DATA:  Hypoxia today. History of hypertension, lung cancer. EXAM: CT ANGIOGRAPHY CHEST WITH CONTRAST TECHNIQUE: Multidetector CT imaging of the chest was performed using the standard protocol during bolus administration of intravenous contrast. Multiplanar CT image reconstructions and MIPs were obtained to evaluate the vascular anatomy. CONTRAST:  137mL ISOVUE-370 IOPAMIDOL (ISOVUE-370) INJECTION 76% COMPARISON:  12/02/2013 FINDINGS: Cardiovascular: There is good opacification of the central and segmental pulmonary arteries. No focal filling defects. No evidence of significant pulmonary embolus. Cardiac enlargement with reflux of contrast material into the hepatic veins suggesting right heart failure. No pericardial effusion. Postoperative changes consistent with coronary bypass. Normal caliber thoracic aorta with scattered calcification. No aortic dissection. Great vessel origins are patent. Mediastinum/Nodes: No significant lymphadenopathy in the chest. Esophagus is decompressed. Small esophageal hiatal hernia. Lungs/Pleura: Motion artifact limits examination. There are moderate bilateral  pleural effusions with bilateral basilar atelectasis or consolidation, greater on the right. Hazy perihilar infiltrates likely indicate edema. Mild emphysematous changes in the upper lungs. Peripheral fibrosis. Nodule in the left upper lung measuring 4 mm is unchanged since previous study. Additional nodule seen previously are not well demonstrated today. This is likely technical. Upper Abdomen: No acute process demonstrated on visualized upper abdominal structures. Musculoskeletal: Degenerative changes in the spine. No destructive bone lesions. Postoperative sternotomy wires. Review of the MIP images confirms the above findings. IMPRESSION: 1. No evidence of significant pulmonary embolus. 2. Cardiac enlargement with reflux of contrast material into the hepatic veins suggesting right  heart failure. 3. Moderate bilateral pleural effusions with basilar atelectasis or consolidation, greater on the right. 4. Mild emphysematous changes in the upper lungs. 4 mm nodule in the left upper lung is unchanged since previous study, likely benign. Aortic Atherosclerosis (ICD10-I70.0) and Emphysema (ICD10-J43.9). Electronically Signed   By: Lucienne Capers M.D.   On: 08/27/2017 23:54   Dg Chest Port 1 View  Result Date: 08/29/2017 CLINICAL DATA:  84 year old male with heart failure. Right lower lobe consolidation. EXAM: PORTABLE CHEST 1 VIEW COMPARISON:  CTA chest 08/27/2017 and earlier. FINDINGS: Portable AP upright view at 0910 hours. Improved lung volumes since 08/27/2017. Stable cardiac size and mediastinal contours. Prior CABG. No pneumothorax. Pulmonary vascularity appears mildly regressed. Small volume right pleural effusion is evident including some fluid in the minor fissure. The left pleural effusion was better demonstrated by CT. Superimposed patchy and confluent right lower lobe opacity which has progressed since 08/06/2017, appears stable from the recent CT. No new pulmonary opacity. IMPRESSION: 1. Small right pleural effusion with patchy and confluent right lower lobe opacity, suspicious for pneumonia and/or aspiration in this setting. 2. Interval improved lung volumes and no new cardiopulmonary abnormality identified. Electronically Signed   By: Genevie Ann M.D.   On: 08/29/2017 09:23   Dg Chest Portable 1 View  Result Date: 08/27/2017 CLINICAL DATA:  Hypoxemia with productive cough. EXAM: PORTABLE CHEST 1 VIEW COMPARISON:  08/07/2017 FINDINGS: Bilateral airspace opacity with small right pleural effusion and cephalized blood flow. Normal heart size. Status post CABG. IMPRESSION: CHF pattern. Electronically Signed   By: Monte Fantasia M.D.   On: 08/27/2017 14:42    Scheduled Meds: . aspirin  324 mg Oral Once  . aspirin  81 mg Oral Daily  . calcium carbonate  1 tablet Oral Daily  .  diltiazem  120 mg Oral Daily  . doxazosin  2 mg Oral Daily  . [START ON 08/30/2017] furosemide  40 mg Intravenous Daily  . ipratropium-albuterol  3 mL Nebulization TID  . iron polysaccharides  150 mg Oral Daily  . sodium chloride flush  3 mL Intravenous Q12H   Continuous Infusions: . sodium chloride      LOS: 2 days   Time spent: 30 mins  Irwin Brakeman, MD [facility] Pager [phone]  If 7PM-7AM, please contact night-coverage [email or web address] Password [facility] 08/29/2017, 11:49 AM

## 2017-08-29 NOTE — [place] FL2 (Signed)
[place] MEDICAID FL2 LEVEL OF CARE SCREENING TOOL     IDENTIFICATION  Patient Name: Steven [name] Birthdate: [birth year] Sex: male Admission Date (Current Location): 08/27/2017  [facility] and [place] Number:  [employer] and Address:  [facility] [address] [place]      Provider Number: [id number]  Attending Physician Name and Address:  Murlean Iba, MD  Relative Name and Phone Number:       Current Level of Care: Hospital Recommended Level of Care: [place] Prior Approval Number:    Date Approved/Denied:   PASRR Number: [id number]  Discharge Plan: SNF    Current Diagnoses: Patient Active Problem List   Diagnosis Date Noted  . CHF (congestive heart failure) ([place]) 08/27/2017  . Dementia 08/27/2017  . Chronic anemia 08/27/2017  . Sundowning 08/09/2017  . Community acquired pneumonia of right lower lobe of lung ([place])   . Goals of care, counseling/discussion   . Palliative care by specialist   . DNR (do not resuscitate) discussion   . Acute respiratory failure with hypoxia and hypercapnia ([place]) 08/07/2017  . PNA (pneumonia) 08/07/2017  . Abdominal bruit 10/15/2014  . CKD (chronic kidney disease), stage III ([place]) 09/28/2014  . BPH (benign prostatic hyperplasia) 02/25/2013  . Squamous cell carcinoma of lung ([place]) 11/13/2011  . COPD with emphysema ([place]) 07/29/2007  . Hyperlipidemia with target LDL less than 100 07/28/2007  . Anxiety state 07/28/2007  . ERECTILE DYSFUNCTION 07/28/2007  . Essential hypertension 07/28/2007  . Coronary atherosclerosis 07/28/2007    Orientation RESPIRATION BLADDER Height & Weight     Self  O2(2L) Incontinent Weight: 139 lb 12.4 oz (63.4 kg) Height:     BEHAVIORAL SYMPTOMS/MOOD NEUROLOGICAL BOWEL NUTRITION STATUS        Diet(DYS 1)  AMBULATORY STATUS COMMUNICATION OF NEEDS Skin   Limited Assist Verbally Normal                       Personal Care Assistance Level  of Assistance  Bathing, Feeding, Dressing Bathing Assistance: Maximum assistance Feeding assistance: Limited assistance Dressing Assistance: Maximum assistance     Functional Limitations Info  Sight, Hearing, Speech Sight Info: Adequate Hearing Info: Adequate Speech Info: Adequate    SPECIAL CARE FACTORS FREQUENCY  PT (By licensed PT)     PT Frequency: 5x/week              Contractures Contractures Info: Not present    Additional Factors Info  Code Status, Allergies, Psychotropic Code Status Info: DNR Allergies Info: NKA Psychotropic Info: Klonopin         Current Medications (08/29/2017):  This is the current hospital active medication list Current Facility-Administered Medications  Medication Dose Route Frequency Provider Last Rate Last Dose  . 0.9 %  sodium chloride infusion  250 mL Intravenous PRN Johnson-Pitts, Endia, MD      . acetaminophen (TYLENOL) tablet 650 mg  650 mg Oral Q4H PRN Johnson-Pitts, Endia, MD      . albuterol (PROVENTIL) (2.5 MG/3ML) 0.083% nebulizer solution 2.5 mg  2.5 mg Nebulization Q2H PRN Johnson-Pitts, Endia, MD      . aspirin chewable tablet 324 mg  324 mg Oral Once Johnson-Pitts, Endia, MD      . aspirin chewable tablet 81 mg  81 mg Oral Daily Johnson-Pitts, Endia, MD   81 mg at 08/29/17 0959  . calcium carbonate (TUMS - dosed in mg elemental calcium) chewable tablet 200 mg of  elemental calcium  1 tablet Oral Daily Johnson-Pitts, Endia, MD   200 mg of elemental calcium at 08/29/17 0959  . clonazePAM (KLONOPIN) tablet 0.5 mg  0.5 mg Oral TID PRN Johnson-Pitts, Endia, MD      . diltiazem (CARDIZEM CD) 24 hr capsule 120 mg  120 mg Oral Daily Johnson-Pitts, Endia, MD   120 mg at 08/29/17 0958  . doxazosin (CARDURA) tablet 2 mg  2 mg Oral Daily Johnson-Pitts, Endia, MD   2 mg at 08/29/17 0958  . furosemide (LASIX) injection 40 mg  40 mg Intravenous Q12H Kathie Dike, MD   40 mg at 08/29/17 0533  . ipratropium-albuterol (DUONEB) 0.5-2.5 (3)  MG/3ML nebulizer solution 3 mL  3 mL Nebulization TID Johnson-Pitts, Endia, MD   3 mL at 08/29/17 0724  . iron polysaccharides (NIFEREX) capsule 150 mg  150 mg Oral Daily Johnson-Pitts, Endia, MD   150 mg at 08/29/17 0958  . ondansetron (ZOFRAN) injection 4 mg  4 mg Intravenous Q6H PRN Johnson-Pitts, Endia, MD      . sodium chloride flush (NS) 0.9 % injection 3 mL  3 mL Intravenous Q12H Johnson-Pitts, Endia, MD   3 mL at 08/29/17 0958  . sodium chloride flush (NS) 0.9 % injection 3 mL  3 mL Intravenous PRN Johnson-Pitts, Endia, MD         Discharge Medications: Please see discharge summary for a list of discharge medications.  Relevant Imaging Results:  Relevant Lab Results:   Additional Information SSN [id number] Clydene Pugh, LCSW

## 2017-08-29 NOTE — Evaluation (Signed)
Physical Therapy Evaluation Patient Details Name: Steven [name] MRN: [id number] DOB: [birth year] Today's Date: 08/29/2017   History of Present Illness  Steven [name] is a 84 y.o. male with medical history significant of dementia, copd presents with hypoxia from NH.  Per report patient arrived with a document sat of 56% this morning at the nursing facility.  Sent over to the emergency room.  When he arrived here he was set at 9 7% on 3 L.  At some point his blood pressure was 89 systolic and EMS gave 626 cc normal saline bolus.  Pressure has been stable here.  She has a history of dementia and is unable to give me any history personally.  He was admitted to the hospital July 3 for COPD exacerbation and acute respiratory failure with hypoxia hypercapnia.  At that time he was noted to have dysphasia with high risk for aspiration and dementia with behavioral disturbances.    Clinical Impression  Patient presents with O2 cannula off with O2 saturation at 90%, on exertion O2 sats dropped to 87% and patient put back on 2 LPM O2.  Patient limited to a few steps in room due to c/o fatigue and SOB on exertion, O2 sats at 92-95% while on 2 LPM and patient tolerated sitting up in chair after therapy - nursing staff aware.  Patient will benefit from continued physical therapy in hospital and recommended venue below to increase strength, balance, endurance for safe ADLs and gait.    Follow Up Recommendations SNF;Supervision/Assistance - 24 hour    Equipment Recommendations  None recommended by PT    Recommendations for Other Services       Precautions / Restrictions Precautions Precautions: Fall Restrictions Weight Bearing Restrictions: No      Mobility  Bed Mobility Overal bed mobility: Needs Assistance Bed Mobility: Supine to Sit     Supine to sit: Supervision     General bed mobility comments: slow labored movement  Transfers Overall transfer level: Needs assistance Equipment used:  Rolling walker (2 wheeled);None Transfers: Sit to/from [employer] to Stand: Min assist Stand pivot transfers: Min assist       General transfer comment: Mod assist sit to stand without using AD, Min assist with RW  Ambulation/Gait Ambulation/Gait assistance: Mod assist Gait Distance (Feet): 10 Feet Assistive device: Rolling walker (2 wheeled) Gait Pattern/deviations: Decreased step length - right;Decreased step length - left;Decreased stride length Gait velocity: slow   General Gait Details: limited to 10-12 slow unsteady steps in room due to c/o SOB, poor standing balance while on 2 LPM O2 with saturation at 92-95%  Stairs            Wheelchair Mobility    Modified Rankin (Stroke Patients Only)       Balance Overall balance assessment: Needs assistance Sitting-balance support: Feet supported;No upper extremity supported Sitting balance-Leahy Scale: Good     Standing balance support: Bilateral upper extremity supported;During functional activity Standing balance-Leahy Scale: Fair Standing balance comment: fair using RW, poor without AD                             Pertinent Vitals/Pain Pain Assessment: No/denies pain    Home Living Family/patient expects to be discharged to:: Skilled nursing facility Living Arrangements: Alone                    Prior Function Level of Independence: Independent  Hand Dominance        Extremity/Trunk Assessment   Upper Extremity Assessment Upper Extremity Assessment: Generalized weakness    Lower Extremity Assessment Lower Extremity Assessment: Generalized weakness    Cervical / Trunk Assessment Cervical / Trunk Assessment: Normal  Communication   Communication: No difficulties  Cognition Arousal/Alertness: Awake/alert Behavior During Therapy: WFL for tasks assessed/performed Overall Cognitive Status: Within Functional Limits for tasks assessed                                         General Comments      Exercises     Assessment/Plan    PT Assessment Patient needs continued PT services  PT Problem List Decreased balance;Decreased mobility;Decreased activity tolerance;Decreased strength       PT Treatment Interventions Gait training;Functional mobility training;Therapeutic activities;Stair training;Therapeutic exercise;Patient/family education    PT Goals (Current goals can be found in the Care Plan section)  Acute Rehab PT Goals Patient Stated Goal: return home PT Goal Formulation: With patient Time For Goal Achievement: 09/15/17 Potential to Achieve Goals: Good    Frequency Min 3X/week   Barriers to discharge        Co-evaluation               AM-PAC PT "6 Clicks" Daily Activity  Outcome Measure Difficulty turning over in bed (including adjusting bedclothes, sheets and blankets)?: None Difficulty moving from lying on back to sitting on the side of the bed? : A Little Difficulty sitting down on and standing up from a chair with arms (e.g., wheelchair, bedside commode, etc,.)?: A Little Help needed moving to and from a bed to chair (including a wheelchair)?: A Little Help needed walking in hospital room?: A Lot Help needed climbing 3-5 steps with a railing? : A Lot 6 Click Score: 17    End of Session Equipment Utilized During Treatment: Gait belt Activity Tolerance: Patient tolerated treatment well;Patient limited by fatigue Patient left: in chair;with call bell/phone within reach;with chair alarm set Nurse Communication: Mobility status;Other (comment)(nursing staff aware that patient left up in chair) PT Visit Diagnosis: Unsteadiness on feet (R26.81);Other abnormalities of gait and mobility (R26.89);Muscle weakness (generalized) (M62.81)    Time: 6759-1638 PT Time Calculation (min) (ACUTE ONLY): 28 min   Charges:   PT Evaluation $PT Eval Moderate Complexity: 1 Mod PT  Treatments $Therapeutic Activity: 23-37 mins        2:09 PM, 08/29/17 Lonell Grandchild, MPT Physical Therapist with [facility] [phone] office [phone] mobile phone

## 2017-08-29 NOTE — Evaluation (Signed)
Clinical/Bedside Swallow Evaluation Patient Details  Name: Steven [name] MRN: [id number] Date of Birth: 1928/08/05  Today's Date: 08/29/2017 Time: SLP Start Time (ACUTE ONLY): 4098 SLP Stop Time (ACUTE ONLY): 0452 SLP Time Calculation (min) (ACUTE ONLY): 40 min  Past Medical History:  Past Medical History:  Diagnosis Date  . Anxiety   . Cancer [facility]) 2013   Lung - cancer free x3 years.   . Hyperlipidemia   . Hypertension   . Myocardial infarction ([place]) 1999   Past Surgical History:  Past Surgical History:  Procedure Laterality Date  . CARPAL TUNNEL RELEASE  1980's   right and left  . CHOLECYSTECTOMY    . COLONOSCOPY N/A 09/29/2014   Procedure: COLONOSCOPY;  Surgeon: Rogene Houston, MD;  [facility];  Service: Endoscopy;  Laterality: N/A;  . CORONARY ARTERY BYPASS GRAFT  1999   5 grafts  . FLEXIBLE BRONCHOSCOPY  11/07/2011   Procedure: FLEXIBLE BRONCHOSCOPY;  Surgeon: Gaye Pollack, MD;  Location: [facility];  Service: Thoracic;  Laterality: N/A;  . HERNIA REPAIR  1191   umbilical hernia repair  . JOINT REPLACEMENT  2000   right shoulder rotator cuff repair   HPI:  Steven [name] is a 84 y.o. male with medical history significant of dementia, copd presents with hypoxia from NH.  Per report patient arrived with a document sat of 56% this morning at the nursing facility.  Sent over to the emergency room.  When he arrived here he was set at 9 7% on 3 L.  At some point his blood pressure was 89 systolic and EMS gave 478 cc normal saline bolus.  Pressure has been stable here.  She has a history of dementia and is unable to give me any history personally.  He was admitted to the hospital July 3 for COPD exacerbation and acute respiratory failure with hypoxia hypercapnia.  At that time he was noted to have dysphagia and dementia with behavioral disturbances.    Assessment / Plan / Recommendation Clinical Impression  Clinical bedside swallowing evaluation completed this day.   Oral care and repositioning of the Pt completed prior to and after PO trials.  Based on MBSS on 08/13/2017, Pt presents with mild/mod oral phase and moderate pharyngeal phase dysphagia with silent aspiration of thins and NTL. Pt is at risk for aspiration with all liquids at this time. Recommend D2/chopped with HTL and ok for po meds whole in puree or with HTL. Pt stated preference for PTL and can alternate PTL with HTL via spoon sips; however, Pt at significant risk for dehydration on thickened liquids. Pt will need to cough/clear throat periodically and repeat/dry swallow to facilitate pharyngeal clearance. Pt chest port as of today notes suspect PNA and/or aspiration. Recommend f/u dysphagia intervention at SNF and SLP will continue to follow during acute stay. Above to Pt and RN for oral care before and after PO and compensatory strategies for slow rate, small sips/bites and hard cough after swallow.   SLP Visit Diagnosis: Dysphagia, oropharyngeal phase (R13.12)    Aspiration Risk  Severe aspiration risk;Risk for inadequate nutrition/hydration    Diet Recommendation Ice chips PRN after oral care;Dysphagia 2 (Fine chop);Honey-thick liquid; may provide pudding-thick liquid   Liquid Administration via: Spoon;No straw Medication Administration: Whole meds with puree Supervision: Staff to assist with self feeding;Full supervision/cueing for compensatory strategies Compensations: Minimize environmental distractions;Slow rate;Small sips/bites;Hard cough after swallow Postural Changes: Seated upright at 90 degrees;Remain upright for at least 30 minutes after  po intake    Other  Recommendations Oral Care Recommendations: Oral care prior to ice chip/H20;Oral care before and after PO Other Recommendations: Order thickener from pharmacy;Prohibited food (jello, ice cream, thin soups);Remove water pitcher;Clarify dietary restrictions   Follow up Recommendations Skilled Nursing facility      Frequency and  Duration min 1 x/week  1 week       Prognosis Prognosis for Safe Diet Advancement: Guarded Barriers to Reach Goals: Cognitive deficits;Severity of deficits      Swallow Study   General Date of Onset: 08/27/17 HPI: Steven [name] is a 84 y.o. male with medical history significant of dementia, copd presents with hypoxia from NH.  Per report patient arrived with a document sat of 56% this morning at the nursing facility.  Sent over to the emergency room.  When he arrived here he was set at 9 7% on 3 L.  At some point his blood pressure was 89 systolic and EMS gave 497 cc normal saline bolus.  Pressure has been stable here.  She has a history of dementia and is unable to give me any history personally.  He was admitted to the hospital July 3 for COPD exacerbation and acute respiratory failure with hypoxia hypercapnia.  At that time he was noted to have dysphagia and dementia with behavioral disturbances.  Previous Swallow Assessment: MBSS on 08/13/2017 with reported  mild-mod oral dysphagia and moderate pharyngeal dysphagia, including silent aspiration of thins with high risk for aspiration on all liquids. Diet Prior to this Study: Dysphagia 2 (chopped);Honey-thick liquids Temperature Spikes Noted: No Respiratory Status: Nasal cannula(Nasal cannula misplaced upon entry.  SLP repositioned) Behavior/Cognition: Cooperative;Alert;Confused(Pt oriented to self only; continued to ask where he was) Oral Cavity Assessment: Within Functional Limits Oral Care Completed by SLP: Yes Oral Cavity - Dentition: Edentulous Vision: Functional for self-feeding Self-Feeding Abilities: Needs assist Patient Positioning: Upright in bed Baseline Vocal Quality: Low vocal intensity Volitional Cough: Congested Volitional Swallow: Able to elicit    Oral/Motor/Sensory Function Overall Oral Motor/Sensory Function: Within functional limits   Ice Chips Ice chips: Within functional limits Presentation: Spoon   Thin Liquid  Thin Liquid: Not tested Other Comments: Not tested due to recent report of silent aspiration on MBSS and suspect PNA on this visit.    Nectar Thick Nectar Thick Liquid: Not tested   Honey Thick Honey Thick Liquid: Impaired via cup sip; small spoon sips with no over s/sx of aspiration Presentation: Cup;Spoon Pharyngeal Phase Impairments: Suspected delayed Swallow;Cough - Delayed Other Comments: Impairment demonstrated on cup sips; small tsp sips WFL   Puree Puree: Within functional limits Presentation: Spoon   Solid     Solid: Not tested      Georgetta Haber [facility] 08/29/2017,5:33 PM

## 2017-08-30 ENCOUNTER — Ambulatory Visit (INDEPENDENT_AMBULATORY_CARE_PROVIDER_SITE_OTHER): Payer: Medicare HMO | Admitting: Internal Medicine

## 2017-08-30 DIAGNOSIS — N183 Chronic kidney disease, stage 3 (moderate): Secondary | ICD-10-CM | POA: Diagnosis not present

## 2017-08-30 DIAGNOSIS — I110 Hypertensive heart disease with heart failure: Secondary | ICD-10-CM | POA: Diagnosis not present

## 2017-08-30 DIAGNOSIS — J9621 Acute and chronic respiratory failure with hypoxia: Secondary | ICD-10-CM | POA: Diagnosis not present

## 2017-08-30 DIAGNOSIS — J189 Pneumonia, unspecified organism: Secondary | ICD-10-CM | POA: Diagnosis not present

## 2017-08-30 DIAGNOSIS — I5033 Acute on chronic diastolic (congestive) heart failure: Secondary | ICD-10-CM

## 2017-08-30 DIAGNOSIS — F0391 Unspecified dementia with behavioral disturbance: Secondary | ICD-10-CM | POA: Diagnosis not present

## 2017-08-30 DIAGNOSIS — F0390 Unspecified dementia without behavioral disturbance: Secondary | ICD-10-CM | POA: Diagnosis not present

## 2017-08-30 DIAGNOSIS — F419 Anxiety disorder, unspecified: Secondary | ICD-10-CM | POA: Diagnosis not present

## 2017-08-30 DIAGNOSIS — R279 Unspecified lack of coordination: Secondary | ICD-10-CM | POA: Diagnosis not present

## 2017-08-30 DIAGNOSIS — J449 Chronic obstructive pulmonary disease, unspecified: Secondary | ICD-10-CM | POA: Diagnosis not present

## 2017-08-30 DIAGNOSIS — J9601 Acute respiratory failure with hypoxia: Secondary | ICD-10-CM | POA: Diagnosis not present

## 2017-08-30 DIAGNOSIS — J439 Emphysema, unspecified: Secondary | ICD-10-CM | POA: Diagnosis not present

## 2017-08-30 DIAGNOSIS — Z7401 Bed confinement status: Secondary | ICD-10-CM | POA: Diagnosis not present

## 2017-08-30 DIAGNOSIS — I10 Essential (primary) hypertension: Secondary | ICD-10-CM | POA: Diagnosis not present

## 2017-08-30 DIAGNOSIS — D649 Anemia, unspecified: Secondary | ICD-10-CM | POA: Diagnosis not present

## 2017-08-30 DIAGNOSIS — J9602 Acute respiratory failure with hypercapnia: Secondary | ICD-10-CM | POA: Diagnosis not present

## 2017-08-30 DIAGNOSIS — M6281 Muscle weakness (generalized): Secondary | ICD-10-CM | POA: Diagnosis not present

## 2017-08-30 DIAGNOSIS — D509 Iron deficiency anemia, unspecified: Secondary | ICD-10-CM | POA: Diagnosis not present

## 2017-08-30 DIAGNOSIS — I509 Heart failure, unspecified: Secondary | ICD-10-CM | POA: Diagnosis not present

## 2017-08-30 LAB — BASIC METABOLIC PANEL
Anion gap: 7 (ref 5–15)
BUN: 27 mg/dL — ABNORMAL HIGH (ref 8–23)
CO2: 33 mmol/L — ABNORMAL HIGH (ref 22–32)
Calcium: 8.3 mg/dL — ABNORMAL LOW (ref 8.9–10.3)
Chloride: 100 mmol/L (ref 98–111)
Creatinine, Ser: 1.29 mg/dL — ABNORMAL HIGH (ref 0.61–1.24)
GFR calc Af Amer: 56 mL/min — ABNORMAL LOW (ref >60)
GFR calc non Af Amer: 48 mL/min — ABNORMAL LOW (ref >60)
Glucose, Bld: 107 mg/dL — ABNORMAL HIGH (ref 70–99)
Potassium: 4 mmol/L (ref 3.5–5.1)
Sodium: 140 mmol/L (ref 135–145)

## 2017-08-30 LAB — MAGNESIUM: Magnesium: 2.2 mg/dL (ref 1.7–2.4)

## 2017-08-30 MED ORDER — FUROSEMIDE 20 MG PO TABS: 20.0000 mg | ORAL_TABLET | ORAL | Status: DC

## 2017-08-30 MED ORDER — SODIUM CHLORIDE 0.9 % IV BOLUS
250.0000 mL | Freq: Once | INTRAVENOUS | Status: AC
  Administered 2017-08-30: 250 mL via INTRAVENOUS

## 2017-08-30 MED ORDER — AMOXICILLIN-POT CLAVULANATE 500-125 MG PO TABS: 1.0000 | ORAL_TABLET | Freq: Two times a day (BID) | ORAL | 0 refills | Status: AC

## 2017-08-30 MED ORDER — CLONAZEPAM 1 MG PO TABS: 0.5000 mg | ORAL_TABLET | Freq: Three times a day (TID) | ORAL | 0 refills | Status: DC | PRN

## 2017-08-30 NOTE — Clinical Social Work Note (Signed)
Late Entry fpor 08/30/14: Patient's son advised that he did not wish for patient to go back to [facility]. He has been there for abut two week for short term rehab.  He advised of facilities he was interested in patient completing the remaining days of his rehab in.   LCSW faxed referral to requested facilities.     Leimomi Zervas, Clydene Pugh, LCSW

## 2017-08-30 NOTE — Clinical Social Work Note (Signed)
Patient will discharge to [facility]. Patient's son and daughter advised of discharge. Discharge clinicals sent to facility.  Facility started insurance authorization on 08/29/17 and is awaiting authorization.   Angellina Ferdinand, Clydene Pugh, LCSW

## 2017-08-30 NOTE — Care Management Important Message (Signed)
Important Message  Patient Details  Name: Steven [name] MRN: [id number] Date of Birth: [birth year]   Medicare Important Message Given:  Yes    Lile Mccurley, Chauncey Reading, RN 08/30/2017, 1:20 PM

## 2017-08-30 NOTE — Discharge Summary (Addendum)
Physician Discharge Summary  KYCEN SPALLA ZOX:[id number] DOB: 03-27-1928 DOA: 08/27/2017  PCP: Hilbert Corrigan, MD  Admit date: 08/27/2017 Discharge date: 08/30/2017 Disposition: [facility]   Recommendations for Outpatient Follow-up:  1. Follow up with PCP in 2 weeks 2. Follow up with cardiologist in 2-3 weeks 3. Please obtain BMP/CBC in 1 week  Discharge Condition: STABLE   CODE STATUS: DNR    Diet Recommendation: Ice chips PRN after oral care;Dysphagia 2 (Fine chop);Honey-thick liquid; may provide pudding-thick liquid   Brief Hospitalization Summary: Please see all hospital notes, images, labs for full details of the hospitalization.  HPI: Steven [name] is a 84 y.o. male with medical history significant of dementia, copd presents with hypoxia from NH.  Per report patient arrived with a document sat of 56% this morning at the nursing facility.  Sent over to the emergency room.  When he arrived here he was set at 9 7% on 3 L.  At some point his blood pressure was 89 systolic and EMS gave 811 cc normal saline bolus.  Pressure has been stable here.  She has a history of dementia and is unable to give me any history personally.  He was admitted to the hospital July 3 for COPD exacerbation and acute respiratory failure with hypoxia hypercapnia.  At that time he was noted to have dysphasia with high risk for aspiration and dementia with behavioral disturbances.  ED Course: Discussed with Dr Wilson Singer.  Chest x-ray showed changes suggestive of heart failure.  His BNP was elevated.  He was given Lasix and aspirin in the ED.  Over his stay in the ED he became more somnolent and ABG was done.  His CO2 was elevated at 60 so they placed him on BiPAP.  ED physician spoke with family who confirmed DNR status.  Brief Narrative:  84 y/o male with history of COPD, dementia, dysphagia who is at a SNF, was brought to ED with worsening hypoxia. Found to be in decompensated CHF and started on IV lasix.  Initially placed on Bipap in ED, but then improved and now is on nasal cannula. Plan will be to return to SNF once adequately diuresed.  Assessment & Plan:   Principal Problem:   CHF (congestive heart failure) (HCC) Active Problems:   Essential hypertension   COPD with emphysema (HCC)   CKD (chronic kidney disease), stage III (HCC)   Acute respiratory failure with hypoxia and hypercapnia (HCC)   Dementia   Chronic anemia   1. Acute on chronic diastolic CHF. Patient has evidence of volume overload with peripheral edema and pleural effusions. He has been started on IV lasix. He is diuresing very well.  He has diuresed 4.5 L since admission and feeling much better.  Echo shows preserved EF with indeterminate diastolic function.   Lasix 20 mg every other day ordered. Follow up with cardiology in 2-3 weeks.  Follow up with PCP in 2 weeks.  2. Aspiration pneumonia - begin IV unasyn, and discharge on oral augmentin x 4 more days.  SLP evaluated patient and placed on dysphagia 2 diet.  3. CKD stage 3. Creatinine is currently at baseline.  4. Elevated troponin. No chest pain or EKG changes. Suspect trop leak in decompensated CHF.  No ACS is suspected.  5. Acute respiratory failure withy hypoxia and hypercapnia. Was briefly placed on Bipap on arrival to emergency room. Initial ABG showed elevated pc02 with compensated pH. Currently on 2L oxygen with improved mental status.  His  mentation is stable and likely his baseline.    6. Anemia of chronic disease. Hemoglobin has been stable at 8. Continue to follow.  7. COPD. No wheezing. Continue bronchodilators as needed. 8. Anxiety. Continue klonopin as needed.  9. Dysphagia. Dysphagia 2 with honey thick liquids. 10. HTN. Stable on diltiazem.  11. Dementia - stable. No behavioral problems.    DVT prophylaxis: SCDs Code Status: DNR Family Communication: discussed with son Disposition Plan: PT eval recommended SNF    Procedures:  Echo: - Left  ventricle: Images are limited. The cavity size was normal. Wall thickness was increased in a pattern of mild LVH. Systolic function was vigorous. The estimated ejection fraction was in the range of 65% to 70%. Indeterminate diastolic function. - Aortic valve: Poorly visualized. Moderately calcified leaflets. Peak gradient (S): 7 mm Hg. - Mitral valve: Mildly calcified annulus. There was trivial regurgitation. - Right atrium: Central venous pressure (est): 3 mm Hg. - Atrial septum: No defect or patent foramen ovale was identified. - Tricuspid valve: There was trivial regurgitation. - Pulmonary arteries: Systolic pressure could not be accurately estimated.  - Pericardium, extracardiac: There was no pericardial effusion.  Discharge Diagnoses:  Principal Problem:   CHF (congestive heart failure) (HCC) Active Problems:   Essential hypertension   COPD with emphysema (HCC)   CKD (chronic kidney disease), stage III (HCC)   Acute respiratory failure with hypoxia and hypercapnia (HCC)   Dementia   Chronic anemia  Discharge Instructions: Discharge Instructions    Call MD for:  difficulty breathing, headache or visual disturbances   Complete by:  As directed    Call MD for:  extreme fatigue   Complete by:  As directed    Call MD for:  persistant dizziness or light-headedness   Complete by:  As directed    Call MD for:  persistant nausea and vomiting   Complete by:  As directed    Call MD for:  severe uncontrolled pain   Complete by:  As directed    Increase activity slowly   Complete by:  As directed      Allergies as of 08/30/2017   No Known Allergies     Medication List    STOP taking these medications   gabapentin 100 MG capsule Commonly known as:  NEURONTIN   predniSONE 10 MG tablet Commonly known as:  DELTASONE     TAKE these medications   albuterol (2.5 MG/3ML) 0.083% nebulizer solution Commonly known as:  PROVENTIL Take 3 mLs (2.5 mg total) by  nebulization every 2 (two) hours as needed for wheezing or shortness of breath.   amoxicillin-clavulanate 500-125 MG tablet Commonly known as:  AUGMENTIN Take 1 tablet (500 mg total) by mouth 2 (two) times daily for 4 days.   aspirin 81 MG tablet Take 81 mg by mouth daily.   calcium carbonate 500 MG chewable tablet Commonly known as:  TUMS - dosed in mg elemental calcium Chew 1 tablet by mouth daily.   clonazePAM 1 MG tablet Commonly known as:  KLONOPIN Take 0.5 tablets (0.5 mg total) by mouth 3 (three) times daily as needed.   diltiazem 120 MG 24 hr capsule Commonly known as:  CARDIZEM CD Take 1 capsule (120 mg total) by mouth daily.   doxazosin 2 MG tablet Commonly known as:  CARDURA Take 1 tablet (2 mg total) by mouth daily.   furosemide 20 MG tablet Commonly known as:  LASIX Take 1 tablet (20 mg total) by mouth every other day.  Start taking on:  09/02/2017   ipratropium-albuterol 0.5-2.5 (3) MG/3ML Soln Commonly known as:  DUONEB Take 3 mLs by nebulization 3 (three) times daily.   iron polysaccharides 150 MG capsule Commonly known as:  NIFEREX Take 1 capsule (150 mg total) by mouth daily.      Contact information for after-discharge care    [facility] .   Service:  Skilled Nursing Contact information: [address] [address] [phone]             No Known Allergies Allergies as of 08/30/2017   No Known Allergies     Medication List    STOP taking these medications   gabapentin 100 MG capsule Commonly known as:  NEURONTIN   predniSONE 10 MG tablet Commonly known as:  DELTASONE     TAKE these medications   albuterol (2.5 MG/3ML) 0.083% nebulizer solution Commonly known as:  PROVENTIL Take 3 mLs (2.5 mg total) by nebulization every 2 (two) hours as needed for wheezing or shortness of breath.   amoxicillin-clavulanate 500-125 MG tablet Commonly known as:  AUGMENTIN Take 1  tablet (500 mg total) by mouth 2 (two) times daily for 4 days.   aspirin 81 MG tablet Take 81 mg by mouth daily.   calcium carbonate 500 MG chewable tablet Commonly known as:  TUMS - dosed in mg elemental calcium Chew 1 tablet by mouth daily.   clonazePAM 1 MG tablet Commonly known as:  KLONOPIN Take 0.5 tablets (0.5 mg total) by mouth 3 (three) times daily as needed.   diltiazem 120 MG 24 hr capsule Commonly known as:  CARDIZEM CD Take 1 capsule (120 mg total) by mouth daily.   doxazosin 2 MG tablet Commonly known as:  CARDURA Take 1 tablet (2 mg total) by mouth daily.   furosemide 20 MG tablet Commonly known as:  LASIX Take 1 tablet (20 mg total) by mouth every other day. Start taking on:  09/02/2017   ipratropium-albuterol 0.5-2.5 (3) MG/3ML Soln Commonly known as:  DUONEB Take 3 mLs by nebulization 3 (three) times daily.   iron polysaccharides 150 MG capsule Commonly known as:  NIFEREX Take 1 capsule (150 mg total) by mouth daily.       Procedures/Studies: Dg Chest 1 View  Result Date: 08/07/2017 CLINICAL DATA:  Shortness of breath EXAM: CHEST  1 VIEW COMPARISON:  August 06, 2017 FINDINGS: There is somewhat less patchy airspace opacity in the right base compared to 1 day prior. There is atelectatic change in each lung base currently. Heart is upper normal in size with pulmonary vascularity normal. Patient is status post coronary artery bypass grafting. No adenopathy. No evident lesions. IMPRESSION: Partial clearing right base. Mild bibasilar atelectasis. Stable cardiac silhouette. Electronically Signed   By: Lowella Grip III M.D.   On: 08/07/2017 13:40   Dg Chest 2 View  Result Date: 08/06/2017 CLINICAL DATA:  Cough and congestion EXAM: CHEST - 2 VIEW COMPARISON:  Jun 13, 2017 FINDINGS: There is patchy airspace consolidation in the right base. Lungs are otherwise clear although hyperexpanded. Heart size and pulmonary vascularity are normal. Patient is status post  coronary artery bypass grafting. There is aortic atherosclerosis. No adenopathy. No evident bone lesions. IMPRESSION: Airspace consolidation consistent with pneumonia right base. Lungs elsewhere clear although hyperexpanded. Stable cardiac silhouette. Status post coronary artery bypass grafting. There is aortic atherosclerosis. Aortic Atherosclerosis (ICD10-I70.0). Electronically Signed   By: Lowella Grip III M.D.  On: 08/06/2017 15:41   Ct Angio Chest Pe W Or Wo Contrast  Result Date: 08/27/2017 CLINICAL DATA:  Hypoxia today. History of hypertension, lung cancer. EXAM: CT ANGIOGRAPHY CHEST WITH CONTRAST TECHNIQUE: Multidetector CT imaging of the chest was performed using the standard protocol during bolus administration of intravenous contrast. Multiplanar CT image reconstructions and MIPs were obtained to evaluate the vascular anatomy. CONTRAST:  158mL ISOVUE-370 IOPAMIDOL (ISOVUE-370) INJECTION 76% COMPARISON:  12/02/2013 FINDINGS: Cardiovascular: There is good opacification of the central and segmental pulmonary arteries. No focal filling defects. No evidence of significant pulmonary embolus. Cardiac enlargement with reflux of contrast material into the hepatic veins suggesting right heart failure. No pericardial effusion. Postoperative changes consistent with coronary bypass. Normal caliber thoracic aorta with scattered calcification. No aortic dissection. Great vessel origins are patent. Mediastinum/Nodes: No significant lymphadenopathy in the chest. Esophagus is decompressed. Small esophageal hiatal hernia. Lungs/Pleura: Motion artifact limits examination. There are moderate bilateral pleural effusions with bilateral basilar atelectasis or consolidation, greater on the right. Hazy perihilar infiltrates likely indicate edema. Mild emphysematous changes in the upper lungs. Peripheral fibrosis. Nodule in the left upper lung measuring 4 mm is unchanged since previous study. Additional nodule seen  previously are not well demonstrated today. This is likely technical. Upper Abdomen: No acute process demonstrated on visualized upper abdominal structures. Musculoskeletal: Degenerative changes in the spine. No destructive bone lesions. Postoperative sternotomy wires. Review of the MIP images confirms the above findings. IMPRESSION: 1. No evidence of significant pulmonary embolus. 2. Cardiac enlargement with reflux of contrast material into the hepatic veins suggesting right heart failure. 3. Moderate bilateral pleural effusions with basilar atelectasis or consolidation, greater on the right. 4. Mild emphysematous changes in the upper lungs. 4 mm nodule in the left upper lung is unchanged since previous study, likely benign. Aortic Atherosclerosis (ICD10-I70.0) and Emphysema (ICD10-J43.9). Electronically Signed   By: Lucienne Capers M.D.   On: 08/27/2017 23:54   Dg Chest Port 1 View  Result Date: 08/29/2017 CLINICAL DATA:  84 year old male with heart failure. Right lower lobe consolidation. EXAM: PORTABLE CHEST 1 VIEW COMPARISON:  CTA chest 08/27/2017 and earlier. FINDINGS: Portable AP upright view at 0910 hours. Improved lung volumes since 08/27/2017. Stable cardiac size and mediastinal contours. Prior CABG. No pneumothorax. Pulmonary vascularity appears mildly regressed. Small volume right pleural effusion is evident including some fluid in the minor fissure. The left pleural effusion was better demonstrated by CT. Superimposed patchy and confluent right lower lobe opacity which has progressed since 08/06/2017, appears stable from the recent CT. No new pulmonary opacity. IMPRESSION: 1. Small right pleural effusion with patchy and confluent right lower lobe opacity, suspicious for pneumonia and/or aspiration in this setting. 2. Interval improved lung volumes and no new cardiopulmonary abnormality identified. Electronically Signed   By: Genevie Ann M.D.   On: 08/29/2017 09:23   Dg Chest Portable 1 View  Result  Date: 08/27/2017 CLINICAL DATA:  Hypoxemia with productive cough. EXAM: PORTABLE CHEST 1 VIEW COMPARISON:  08/07/2017 FINDINGS: Bilateral airspace opacity with small right pleural effusion and cephalized blood flow. Normal heart size. Status post CABG. IMPRESSION: CHF pattern. Electronically Signed   By: Monte Fantasia M.D.   On: 08/27/2017 14:42   Dg Swallowing Func-speech Pathology  Result Date: 08/13/2017 Objective Swallowing Evaluation: Type of Study: MBS-Modified Barium Swallow Study  Patient Details Name: HAIDAR MUSE MRN: [id number] Date of Birth: November 10, 1928 Today's Date: 08/13/2017 Time: SLP Start Time (ACUTE ONLY): 8295 -SLP Stop Time (ACUTE ONLY): 6213 SLP Time  Calculation (min) (ACUTE ONLY): 35 min Past Medical History: Past Medical History: Diagnosis Date . Anxiety  . Cancer [facility]) 2013  Lung - cancer free x3 years.  . Hyperlipidemia  . Hypertension  . Myocardial infarction ([place]) 1999 Past Surgical History: Past Surgical History: Procedure Laterality Date . CARPAL TUNNEL RELEASE  1980's  right and left . CHOLECYSTECTOMY   . COLONOSCOPY N/A 09/29/2014  Procedure: COLONOSCOPY;  Surgeon: Rogene Houston, MD;  [facility];  Service: Endoscopy;  Laterality: N/A; . CORONARY ARTERY BYPASS GRAFT  1999  5 grafts . FLEXIBLE BRONCHOSCOPY  11/07/2011  Procedure: FLEXIBLE BRONCHOSCOPY;  Surgeon: Gaye Pollack, MD;  Location: [place];  Service: Thoracic;  Laterality: N/A; . HERNIA REPAIR  2951  umbilical hernia repair . JOINT REPLACEMENT  2000  right shoulder rotator cuff repair HPI: 84 y.o. male with medical history significant of COPD, hypertension, chronic kidney disease stage III admitted with pneumonia, and acute on chronic respiratory failure. BSE requested.  Subjective: "I am not coughing as much today." Assessment / Plan / Recommendation CHL IP CLINICAL IMPRESSIONS 08/13/2017 Clinical Impression Pt presents with mild/mod oral phase and moderate pharyngeal phase dysphagia with silent aspiration of thins  and NTL. Oral phase is marked by reduced bolus cohesiveness, premature spillage, and piecemeal deglutition. Pharyngeal phase is marked by premature spillage with liquids to the level of the valleculae, reduced epiglottic deflection and tongue base retraction, reduced vocal fold closure, and prominent CP segment resulting in penetration/aspiration during and after the swallow with thins and NTL silent in nature (without benefit of protective cough). Pt with moderate vallecular residue post swallows with thins and min with NTL and HTL. Pt cued to cough strongly throughout the study and was able to expel aspirated material when just beneath vocal folds, however not fully removed once down anterior tracheal wall. Pt with best performance with purees as bolus remained cohesive and resulted in no residuals or penetration/aspiration. Pt did penetrate HTL after the swallow in small amounts and was not removed spontaneously (required verbal cues) and is therefore at risk for aspiration with all liquids at this time. Recommend D2/chopped with HTL and ok for po meds whole in puree or with HTL. Pt will need to cough/clear throat periodically and repeat/dry swallow to facilitate pharyngeal clearance. Pt reports no recent history of PNA except for at present, so SLP is hopeful that Pt will be advanced once clinically stronger after rehab. Recommend f/u dysphagia intervention at SNF and SLP will continue to follow during acute stay. Above to Pt and RN.  SLP Visit Diagnosis Dysphagia, oropharyngeal phase (R13.12) Attention and concentration deficit following -- Frontal lobe and executive function deficit following -- Impact on safety and function Severe aspiration risk;Risk for inadequate nutrition/hydration   CHL IP TREATMENT RECOMMENDATION 08/13/2017 Treatment Recommendations Therapy as outlined in treatment plan below   Prognosis 08/13/2017 Prognosis for Safe Diet Advancement Fair Barriers to Reach Goals Severity of deficits  Barriers/Prognosis Comment -- CHL IP DIET RECOMMENDATION 08/13/2017 SLP Diet Recommendations Dysphagia 2 (Fine chop) solids;Honey thick liquids Liquid Administration via Cup;No straw Medication Administration Whole meds with liquid Compensations Slow rate;Small sips/bites;Hard cough after swallow Postural Changes Remain semi-upright after after feeds/meals (Comment);Seated upright at 90 degrees   CHL IP OTHER RECOMMENDATIONS 08/13/2017 Recommended Consults -- Oral Care Recommendations Oral care BID;Staff/trained caregiver to provide oral care Other Recommendations Order thickener from pharmacy;Prohibited food (jello, ice cream, thin soups);Clarify dietary restrictions   CHL IP FOLLOW UP RECOMMENDATIONS 08/13/2017 Follow up Recommendations Skilled  Nursing facility   [facility] IP FREQUENCY AND DURATION 08/13/2017 Speech Therapy Frequency (ACUTE ONLY) min 2x/week Treatment Duration 1 week      CHL IP ORAL PHASE 08/13/2017 Oral Phase Impaired Oral - Pudding Teaspoon -- Oral - Pudding Cup -- Oral - Honey Teaspoon -- Oral - Honey Cup -- Oral - Nectar Teaspoon -- Oral - Nectar Cup Lingual pumping;Lingual/palatal residue;Piecemeal swallowing;Premature spillage Oral - Nectar Straw -- Oral - Thin Teaspoon Premature spillage Oral - Thin Cup -- Oral - Thin Straw -- Oral - Puree -- Oral - Mech Soft -- Oral - Regular Piecemeal swallowing;Delayed oral transit;Decreased bolus cohesion Oral - Multi-Consistency -- Oral - Pill -- Oral Phase - Comment --  CHL IP PHARYNGEAL PHASE 08/13/2017 Pharyngeal Phase Impaired Pharyngeal- Pudding Teaspoon -- Pharyngeal -- Pharyngeal- Pudding Cup -- Pharyngeal -- Pharyngeal- Honey Teaspoon -- Pharyngeal -- Pharyngeal- Honey Cup Delayed swallow initiation-vallecula;Reduced epiglottic inversion;Penetration/Apiration after swallow;Pharyngeal residue - valleculae;Pharyngeal residue - cp segment Pharyngeal Material does not enter airway;Material enters airway, remains ABOVE vocal cords then ejected out;Material enters  airway, remains ABOVE vocal cords and not ejected out Pharyngeal- Nectar Teaspoon -- Pharyngeal -- Pharyngeal- Nectar Cup Delayed swallow initiation-vallecula;Reduced epiglottic inversion;Reduced airway/laryngeal closure;Penetration/Aspiration during swallow;Penetration/Apiration after swallow;Trace aspiration;Pharyngeal residue - valleculae;Pharyngeal residue - cp segment Pharyngeal Material enters airway, remains ABOVE vocal cords and not ejected out;Material enters airway, passes BELOW cords without attempt by patient to eject out (silent aspiration) Pharyngeal- Nectar Straw -- Pharyngeal -- Pharyngeal- Thin Teaspoon Delayed swallow initiation-vallecula;Reduced epiglottic inversion;Reduced airway/laryngeal closure;Penetration/Apiration after swallow;Pharyngeal residue - valleculae Pharyngeal Material enters airway, passes BELOW cords without attempt by patient to eject out (silent aspiration) Pharyngeal- Thin Cup Delayed swallow initiation-vallecula;Reduced airway/laryngeal closure;Penetration/Aspiration during swallow;Penetration/Apiration after swallow;Reduced epiglottic inversion;Moderate aspiration;Pharyngeal residue - valleculae;Pharyngeal residue - cp segment Pharyngeal Material enters airway, passes BELOW cords without attempt by patient to eject out (silent aspiration);Material enters airway, passes BELOW cords and not ejected out despite cough attempt by patient Pharyngeal- Thin Straw -- Pharyngeal -- Pharyngeal- Puree Delayed swallow initiation-vallecula;WFL Pharyngeal -- Pharyngeal- Mechanical Soft -- Pharyngeal -- Pharyngeal- Regular Delayed swallow initiation-vallecula;Pharyngeal residue - cp segment Pharyngeal -- Pharyngeal- Multi-consistency -- Pharyngeal -- Pharyngeal- Pill WFL Pharyngeal -- Pharyngeal Comment --  CHL IP CERVICAL ESOPHAGEAL PHASE 08/13/2017 Cervical Esophageal Phase Impaired Pudding Teaspoon -- Pudding Cup -- Honey Teaspoon -- Honey Cup -- Nectar Teaspoon -- Nectar Cup Prominent  cricopharyngeal segment Nectar Straw -- Thin Teaspoon -- Thin Cup -- Thin Straw -- Puree -- Mechanical Soft -- Regular -- Multi-consistency -- Pill -- Cervical Esophageal Comment -- Thank you, Genene Churn, [place] No flowsheet data found. PORTER,DABNEY 08/13/2017, 6:20 PM                Subjective: Pt without complaints.  He says he feels much better.    Discharge Exam: Vitals:   08/30/17 0612 08/30/17 0725  BP: (!) 127/35   Pulse: 82   Resp: 18   Temp: 98.3 F (36.8 C)   SpO2: 95% 97%   Vitals:   08/29/17 2002 08/29/17 2218 08/30/17 0612 08/30/17 0725  BP:  138/73 (!) 127/35   Pulse:  93 82   Resp:  17 18   Temp:  98.7 F (37.1 C) 98.3 F (36.8 C)   TempSrc:  Oral Oral   SpO2: 95% 97% 95% 97%  Weight:   55.4 kg (122 lb 3.2 oz)    General exam: Appears calm and comfortable  Respiratory system: clear to auscultation. Respiratory effort normal. Cardiovascular system: S1 & S2  heard.  Gastrointestinal system: Abdomen is nondistended, soft and nontender. No organomegaly or masses felt. Normal bowel sounds heard. Central nervous system: Alert and oriented. No focal neurological deficits. Extremities: Symmetric 5 x 5 power. Trace pretibial edema BLEs.  Skin: No rashes, lesions or ulcers Psychiatry: Judgement and insight appear normal. Mood & affect appropriate.    The results of significant diagnostics from this hospitalization (including imaging, microbiology, ancillary and laboratory) are listed below for reference.     Microbiology: No results found for this or any previous visit (from the past 240 hour(s)).   Labs: BNP (last 3 results) Recent Labs    08/27/17 1437  BNP 962.2*   Basic Metabolic Panel: Recent Labs  Lab 08/27/17 1437 08/27/17 1925 08/28/17 0440 08/29/17 0448 08/30/17 0521  NA 141  --  143 140 140  K 4.8  --  5.0 3.8 4.0  CL 101  --  97* 98 100  CO2 35*  --  39* 36* 33*  GLUCOSE 110*  --  102* 113* 107*  BUN 33*  --  30* 31* 27*   CREATININE 1.23 1.12 1.18 1.21 1.29*  CALCIUM 8.0*  --  8.4* 8.1* 8.3*  MG  --   --   --   --  2.2   Liver Function Tests: No results for input(s): AST, ALT, ALKPHOS, BILITOT, PROT, ALBUMIN in the last 168 hours. No results for input(s): LIPASE, AMYLASE in the last 168 hours. No results for input(s): AMMONIA in the last 168 hours. CBC: Recent Labs  Lab 08/27/17 1437 08/27/17 1925 08/29/17 0448  WBC 6.3 7.1 6.8  NEUTROABS 5.3  --   --   HGB 7.7* 8.6* 8.3*  HCT 26.2* 28.5* 27.2*  MCV 100.0 99.3 94.8  PLT 95* 117* 109*   Cardiac Enzymes: Recent Labs  Lab 08/27/17 1437 08/27/17 1925 08/28/17 0111  TROPONINI 0.10* 0.10* 0.07*   BNP: Invalid input(s): POCBNP CBG: No results for input(s): GLUCAP in the last 168 hours. D-Dimer Recent Labs    08/27/17 1437  DDIMER 3.12*   Hgb A1c No results for input(s): HGBA1C in the last 72 hours. Lipid Profile No results for input(s): CHOL, HDL, LDLCALC, TRIG, CHOLHDL, LDLDIRECT in the last 72 hours. Thyroid function studies Recent Labs    08/27/17 1925  TSH 5.177*   Anemia work up No results for input(s): VITAMINB12, FOLATE, FERRITIN, TIBC, IRON, RETICCTPCT in the last 72 hours. Urinalysis    Component Value Date/Time   COLORURINE YELLOW 08/27/2017 [place] 08/27/2017 1358   LABSPEC 1.018 08/27/2017 1358   PHURINE 6.0 08/27/2017 1358   GLUCOSEU NEGATIVE 08/27/2017 1358   HGBUR NEGATIVE 08/27/2017 [place] 08/27/2017 [place] 08/27/2017 1358   PROTEINUR NEGATIVE 08/27/2017 1358   UROBILINOGEN 0.2 11/05/2011 1456   NITRITE NEGATIVE 08/27/2017 1358   LEUKOCYTESUR NEGATIVE 08/27/2017 1358   Sepsis Labs Invalid input(s): PROCALCITONIN,  WBC,  LACTICIDVEN Microbiology No results found for this or any previous visit (from the past 240 hour(s)).  Time coordinating discharge: 34 mins  SIGNED:  Irwin Brakeman, MD  [facility] 08/30/2017, 11:41 AM Pager [phone]  If 7PM-7AM, please contact night-coverage [email or web address] Password [email or web address]

## 2017-08-30 NOTE — Progress Notes (Signed)
Report called to nursing staff at [facility] in [place], [place]. EMS present to pickup patient. IV removed, WNL.

## 2017-09-05 DIAGNOSIS — N183 Chronic kidney disease, stage 3 (moderate): Secondary | ICD-10-CM | POA: Diagnosis not present

## 2017-09-05 DIAGNOSIS — I10 Essential (primary) hypertension: Secondary | ICD-10-CM | POA: Diagnosis not present

## 2017-09-05 DIAGNOSIS — I5033 Acute on chronic diastolic (congestive) heart failure: Secondary | ICD-10-CM | POA: Diagnosis not present

## 2017-09-05 DIAGNOSIS — J449 Chronic obstructive pulmonary disease, unspecified: Secondary | ICD-10-CM | POA: Diagnosis not present

## 2017-09-10 ENCOUNTER — Encounter: Payer: Self-pay | Admitting: Internal Medicine

## 2017-09-13 ENCOUNTER — Encounter: Payer: Self-pay | Admitting: Nurse Practitioner

## 2017-09-17 ENCOUNTER — Telehealth: Payer: Self-pay | Admitting: Internal Medicine

## 2017-09-17 NOTE — Telephone Encounter (Signed)
appt scheduled Facility notified

## 2017-09-19 DIAGNOSIS — J439 Emphysema, unspecified: Secondary | ICD-10-CM | POA: Diagnosis not present

## 2017-09-19 DIAGNOSIS — R1310 Dysphagia, unspecified: Secondary | ICD-10-CM | POA: Diagnosis not present

## 2017-09-19 DIAGNOSIS — I2510 Atherosclerotic heart disease of native coronary artery without angina pectoris: Secondary | ICD-10-CM | POA: Diagnosis not present

## 2017-09-19 DIAGNOSIS — I130 Hypertensive heart and chronic kidney disease with heart failure and stage 1 through stage 4 chronic kidney disease, or unspecified chronic kidney disease: Secondary | ICD-10-CM | POA: Diagnosis not present

## 2017-09-19 DIAGNOSIS — E440 Moderate protein-calorie malnutrition: Secondary | ICD-10-CM | POA: Diagnosis not present

## 2017-09-19 DIAGNOSIS — N183 Chronic kidney disease, stage 3 (moderate): Secondary | ICD-10-CM | POA: Diagnosis not present

## 2017-09-19 DIAGNOSIS — I5033 Acute on chronic diastolic (congestive) heart failure: Secondary | ICD-10-CM | POA: Diagnosis not present

## 2017-09-19 DIAGNOSIS — D631 Anemia in chronic kidney disease: Secondary | ICD-10-CM | POA: Diagnosis not present

## 2017-09-19 DIAGNOSIS — F0390 Unspecified dementia without behavioral disturbance: Secondary | ICD-10-CM | POA: Diagnosis not present

## 2017-09-20 DIAGNOSIS — D631 Anemia in chronic kidney disease: Secondary | ICD-10-CM | POA: Diagnosis not present

## 2017-09-20 DIAGNOSIS — R1310 Dysphagia, unspecified: Secondary | ICD-10-CM | POA: Diagnosis not present

## 2017-09-20 DIAGNOSIS — E440 Moderate protein-calorie malnutrition: Secondary | ICD-10-CM | POA: Diagnosis not present

## 2017-09-20 DIAGNOSIS — I5033 Acute on chronic diastolic (congestive) heart failure: Secondary | ICD-10-CM | POA: Diagnosis not present

## 2017-09-20 DIAGNOSIS — N183 Chronic kidney disease, stage 3 (moderate): Secondary | ICD-10-CM | POA: Diagnosis not present

## 2017-09-20 DIAGNOSIS — F0390 Unspecified dementia without behavioral disturbance: Secondary | ICD-10-CM | POA: Diagnosis not present

## 2017-09-20 DIAGNOSIS — I130 Hypertensive heart and chronic kidney disease with heart failure and stage 1 through stage 4 chronic kidney disease, or unspecified chronic kidney disease: Secondary | ICD-10-CM | POA: Diagnosis not present

## 2017-09-20 DIAGNOSIS — I2510 Atherosclerotic heart disease of native coronary artery without angina pectoris: Secondary | ICD-10-CM | POA: Diagnosis not present

## 2017-09-20 DIAGNOSIS — J439 Emphysema, unspecified: Secondary | ICD-10-CM | POA: Diagnosis not present

## 2017-09-23 DIAGNOSIS — D631 Anemia in chronic kidney disease: Secondary | ICD-10-CM | POA: Diagnosis not present

## 2017-09-23 DIAGNOSIS — N183 Chronic kidney disease, stage 3 (moderate): Secondary | ICD-10-CM | POA: Diagnosis not present

## 2017-09-23 DIAGNOSIS — E440 Moderate protein-calorie malnutrition: Secondary | ICD-10-CM | POA: Diagnosis not present

## 2017-09-23 DIAGNOSIS — R1310 Dysphagia, unspecified: Secondary | ICD-10-CM | POA: Diagnosis not present

## 2017-09-23 DIAGNOSIS — I130 Hypertensive heart and chronic kidney disease with heart failure and stage 1 through stage 4 chronic kidney disease, or unspecified chronic kidney disease: Secondary | ICD-10-CM | POA: Diagnosis not present

## 2017-09-23 DIAGNOSIS — I5033 Acute on chronic diastolic (congestive) heart failure: Secondary | ICD-10-CM | POA: Diagnosis not present

## 2017-09-23 DIAGNOSIS — J439 Emphysema, unspecified: Secondary | ICD-10-CM | POA: Diagnosis not present

## 2017-09-23 DIAGNOSIS — I2510 Atherosclerotic heart disease of native coronary artery without angina pectoris: Secondary | ICD-10-CM | POA: Diagnosis not present

## 2017-09-23 DIAGNOSIS — F0390 Unspecified dementia without behavioral disturbance: Secondary | ICD-10-CM | POA: Diagnosis not present

## 2017-09-24 ENCOUNTER — Ambulatory Visit (INDEPENDENT_AMBULATORY_CARE_PROVIDER_SITE_OTHER): Payer: Medicare HMO | Admitting: Nurse Practitioner

## 2017-09-24 ENCOUNTER — Encounter: Payer: Self-pay | Admitting: Nurse Practitioner

## 2017-09-24 VITALS — BP 100/52 | HR 84 | Temp 98.1°F | Ht 67.0 in | Wt 121.0 lb

## 2017-09-24 DIAGNOSIS — N183 Chronic kidney disease, stage 3 unspecified: Secondary | ICD-10-CM

## 2017-09-24 DIAGNOSIS — R1310 Dysphagia, unspecified: Secondary | ICD-10-CM | POA: Diagnosis not present

## 2017-09-24 DIAGNOSIS — E440 Moderate protein-calorie malnutrition: Secondary | ICD-10-CM | POA: Diagnosis not present

## 2017-09-24 DIAGNOSIS — Z09 Encounter for follow-up examination after completed treatment for conditions other than malignant neoplasm: Secondary | ICD-10-CM

## 2017-09-24 DIAGNOSIS — I5033 Acute on chronic diastolic (congestive) heart failure: Secondary | ICD-10-CM

## 2017-09-24 DIAGNOSIS — D631 Anemia in chronic kidney disease: Secondary | ICD-10-CM | POA: Diagnosis not present

## 2017-09-24 DIAGNOSIS — J439 Emphysema, unspecified: Secondary | ICD-10-CM | POA: Diagnosis not present

## 2017-09-24 DIAGNOSIS — I2510 Atherosclerotic heart disease of native coronary artery without angina pectoris: Secondary | ICD-10-CM | POA: Diagnosis not present

## 2017-09-24 DIAGNOSIS — I130 Hypertensive heart and chronic kidney disease with heart failure and stage 1 through stage 4 chronic kidney disease, or unspecified chronic kidney disease: Secondary | ICD-10-CM | POA: Diagnosis not present

## 2017-09-24 DIAGNOSIS — F0390 Unspecified dementia without behavioral disturbance: Secondary | ICD-10-CM | POA: Diagnosis not present

## 2017-09-24 NOTE — Progress Notes (Signed)
   Subjective:    Patient ID: Steven [name], male    DOB: [birth year], 84 y.o.   MRN: [id number]   Chief Complaint: nursing home follow uo   HPI Patient was seen in office on 08/06/17 with cough and altered mental status. He was dx with pneumonia and was treated with z pak. He worsened during the night and family took him to ED where he was admitted to hospital with acute respiratory failure. He was discharged to nursing facility on 08/15/17. He returned to hospital on 08/27/17 with O2 sat of 56%. He was found to have elevated troponin levels and was readmitted in heart failure. He was discharged to [facility]. He came home last Wednesday. He is doing much better. He is alert and oriented. His family makes sure he takes his meds daily. He si living  By hisself . Family stays with hom during th eday and is only by hisself at night. He actually looks the best he has loked in awhile.   Review of Systems  Constitutional: Negative.   HENT: Negative.   Respiratory: Negative.   Cardiovascular: Negative.   Gastrointestinal: Negative.   Genitourinary: Negative.   Neurological: Negative.   Psychiatric/Behavioral: Negative.   All other systems reviewed and are negative.      Objective:   Physical Exam  Constitutional: He is oriented to person, place, and time. He appears well-developed and well-nourished. No distress.  HENT:  Head: Normocephalic.  Nose: Nose normal.  Mouth/Throat: Oropharynx is clear and moist.  Eyes: Pupils are equal, round, and reactive to light. EOM are normal.  Neck: Normal range of motion and phonation normal. Neck supple. No JVD present. Carotid bruit is not present. No thyroid mass and no thyromegaly present.  Cardiovascular: Normal rate and regular rhythm.  Pulmonary/Chest: Effort normal and breath sounds normal. No respiratory distress.  Abdominal: Soft. Normal appearance, normal aorta and bowel sounds are normal. There is no tenderness.  Musculoskeletal: Normal range  of motion.  Lymphadenopathy:    He has no cervical adenopathy.  Neurological: He is alert and oriented to person, place, and time.  Skin: Skin is warm and dry.  Psychiatric: Judgment normal.  Nursing note and vitals reviewed.  BP (!) 100/52   Pulse 84   Temp 98.1 F (36.7 C) (Oral)   Ht 5\' 7"  (1.702 m)   Wt 121 lb (54.9 kg)   BMI 18.95 kg/m         Assessment & Plan:  Steven [name] in today with chief complaint of nursing home follow uo   1. Acute on chronic diastolic congestive heart failure (HCC) Limit fluid intake Make sure taking daily neds  2. CKD (chronic kidney disease), stage III ([place]) Labs pending  3. Hospital discharge follow-up Hospital records reviewed  Follow up in 1 month  [place], [name]

## 2017-09-24 NOTE — Patient Instructions (Signed)
Heart Failure °Heart failure means your heart has trouble pumping blood. This makes it hard for your body to work well. Heart failure is usually a long-term (chronic) condition. You must take good care of yourself and follow your doctor's treatment plan. °Follow these instructions at home: °· Take your heart medicine as told by your doctor. °? Do not stop taking medicine unless your doctor tells you to. °? Do not skip any dose of medicine. °? Refill your medicines before they run out. °? Take other medicines only as told by your doctor or pharmacist. °· Stay active if told by your doctor. The elderly and people with severe heart failure should talk with a doctor about physical activity. °· Eat heart-healthy foods. Choose foods that are without trans fat and are low in saturated fat, cholesterol, and salt (sodium). This includes fresh or frozen fruits and vegetables, fish, lean meats, fat-free or low-fat dairy foods, whole grains, and high-fiber foods. Lentils and dried peas and beans (legumes) are also good choices. °· Limit salt if told by your doctor. °· Cook in a healthy way. Roast, grill, broil, bake, poach, steam, or stir-fry foods. °· Limit fluids as told by your doctor. °· Weigh yourself every morning. Do this after you pee (urinate) and before you eat breakfast. Write down your weight to give to your doctor. °· Take your blood pressure and write it down if your doctor tells you to. °· Ask your doctor how to check your pulse. Check your pulse as told. °· Lose weight if told by your doctor. °· Stop smoking or chewing tobacco. Do not use gum or patches that help you quit without your doctor's approval. °· Schedule and go to doctor visits as told. °· Nonpregnant women should have no more than 1 drink a day. Men should have no more than 2 drinks a day. Talk to your doctor about drinking alcohol. °· Stop illegal drug use. °· Stay current with shots (immunizations). °· Manage your health conditions as told by your  doctor. °· Learn to manage your stress. °· Rest when you are tired. °· If it is really hot outside: °? Avoid intense activities. °? Use air conditioning or fans, or get in a cooler place. °? Avoid caffeine and alcohol. °? Wear loose-fitting, lightweight, and light-colored clothing. °· If it is really cold outside: °? Avoid intense activities. °? Layer your clothing. °? Wear mittens or gloves, a hat, and a scarf when going outside. °? Avoid alcohol. °· Learn about heart failure and get support as needed. °· Get help to maintain or improve your quality of life and your ability to care for yourself as needed. °Contact a doctor if: °· You gain weight quickly. °· You are more short of breath than usual. °· You cannot do your normal activities. °· You tire easily. °· You cough more than normal, especially with activity. °· You have any or more puffiness (swelling) in areas such as your hands, feet, ankles, or belly (abdomen). °· You cannot sleep because it is hard to breathe. °· You feel like your heart is beating fast (palpitations). °· You get dizzy or light-headed when you stand up. °Get help right away if: °· You have trouble breathing. °· There is a change in mental status, such as becoming less alert or not being able to focus. °· You have chest pain or discomfort. °· You faint. °This information is not intended to replace advice given to you by your health care provider. Make sure you   discuss any questions you have with your health care provider. °Document Released: 11/01/2007 Document Revised: 06/30/2015 Document Reviewed: 03/10/2012 °Elsevier Interactive Patient Education © 2017 [employer] Inc[employer] ° °

## 2017-09-25 DIAGNOSIS — E440 Moderate protein-calorie malnutrition: Secondary | ICD-10-CM | POA: Diagnosis not present

## 2017-09-25 DIAGNOSIS — F0390 Unspecified dementia without behavioral disturbance: Secondary | ICD-10-CM | POA: Diagnosis not present

## 2017-09-25 DIAGNOSIS — I2510 Atherosclerotic heart disease of native coronary artery without angina pectoris: Secondary | ICD-10-CM | POA: Diagnosis not present

## 2017-09-25 DIAGNOSIS — N183 Chronic kidney disease, stage 3 (moderate): Secondary | ICD-10-CM | POA: Diagnosis not present

## 2017-09-25 DIAGNOSIS — J439 Emphysema, unspecified: Secondary | ICD-10-CM | POA: Diagnosis not present

## 2017-09-25 DIAGNOSIS — R1310 Dysphagia, unspecified: Secondary | ICD-10-CM | POA: Diagnosis not present

## 2017-09-25 DIAGNOSIS — I5033 Acute on chronic diastolic (congestive) heart failure: Secondary | ICD-10-CM | POA: Diagnosis not present

## 2017-09-25 DIAGNOSIS — D631 Anemia in chronic kidney disease: Secondary | ICD-10-CM | POA: Diagnosis not present

## 2017-09-25 DIAGNOSIS — I130 Hypertensive heart and chronic kidney disease with heart failure and stage 1 through stage 4 chronic kidney disease, or unspecified chronic kidney disease: Secondary | ICD-10-CM | POA: Diagnosis not present

## 2017-09-25 LAB — CBC WITH DIFFERENTIAL/PLATELET
Basophils Absolute: 0.1 10*3/uL (ref 0.0–0.2)
Basos: 1 %
EOS (ABSOLUTE): 0.2 10*3/uL (ref 0.0–0.4)
Eos: 2 %
Hematocrit: 30.9 % — ABNORMAL LOW (ref 37.5–51.0)
Hemoglobin: 9.4 g/dL — ABNORMAL LOW (ref 13.0–17.7)
Immature Grans (Abs): 0 10*3/uL (ref 0.0–0.1)
Immature Granulocytes: 0 %
Lymphocytes Absolute: 3.1 10*3/uL (ref 0.7–3.1)
Lymphs: 40 %
MCH: 28.6 pg (ref 26.6–33.0)
MCHC: 30.4 g/dL — ABNORMAL LOW (ref 31.5–35.7)
MCV: 94 fL (ref 79–97)
Monocytes Absolute: 0.6 10*3/uL (ref 0.1–0.9)
Monocytes: 8 %
Neutrophils Absolute: 3.8 10*3/uL (ref 1.4–7.0)
Neutrophils: 49 %
Platelets: 219 10*3/uL (ref 150–450)
RBC: 3.29 x10E6/uL — ABNORMAL LOW (ref 4.14–5.80)
RDW: 16.1 % — ABNORMAL HIGH (ref 12.3–15.4)
WBC: 7.7 10*3/uL (ref 3.4–10.8)

## 2017-09-25 LAB — CMP14+EGFR
ALT: 16 IU/L (ref 0–44)
AST: 21 IU/L (ref 0–40)
Albumin/Globulin Ratio: 1.1 — ABNORMAL LOW (ref 1.2–2.2)
Albumin: 3.7 g/dL (ref 3.5–4.7)
Alkaline Phosphatase: 94 IU/L (ref 39–117)
BUN/Creatinine Ratio: 13 (ref 10–24)
BUN: 21 mg/dL (ref 8–27)
Bilirubin Total: 0.3 mg/dL (ref 0.0–1.2)
CO2: 28 mmol/L (ref 20–29)
Calcium: 9.3 mg/dL (ref 8.6–10.2)
Chloride: 96 mmol/L (ref 96–106)
Creatinine, Ser: 1.56 mg/dL — ABNORMAL HIGH (ref 0.76–1.27)
GFR calc Af Amer: 45 mL/min/{1.73_m2} — ABNORMAL LOW (ref >59)
GFR calc non Af Amer: 39 mL/min/{1.73_m2} — ABNORMAL LOW (ref >59)
Globulin, Total: 3.3 g/dL (ref 1.5–4.5)
Glucose: 110 mg/dL — ABNORMAL HIGH (ref 65–99)
Potassium: 4 mmol/L (ref 3.5–5.2)
Sodium: 139 mmol/L (ref 134–144)
Total Protein: 7 g/dL (ref 6.0–8.5)

## 2017-09-26 ENCOUNTER — Ambulatory Visit (INDEPENDENT_AMBULATORY_CARE_PROVIDER_SITE_OTHER): Payer: Medicare HMO

## 2017-09-26 DIAGNOSIS — D631 Anemia in chronic kidney disease: Secondary | ICD-10-CM

## 2017-09-26 DIAGNOSIS — I130 Hypertensive heart and chronic kidney disease with heart failure and stage 1 through stage 4 chronic kidney disease, or unspecified chronic kidney disease: Secondary | ICD-10-CM | POA: Diagnosis not present

## 2017-09-26 DIAGNOSIS — F0390 Unspecified dementia without behavioral disturbance: Secondary | ICD-10-CM

## 2017-09-26 DIAGNOSIS — E785 Hyperlipidemia, unspecified: Secondary | ICD-10-CM

## 2017-09-26 DIAGNOSIS — R1310 Dysphagia, unspecified: Secondary | ICD-10-CM | POA: Diagnosis not present

## 2017-09-26 DIAGNOSIS — Z9181 History of falling: Secondary | ICD-10-CM

## 2017-09-26 DIAGNOSIS — J439 Emphysema, unspecified: Secondary | ICD-10-CM

## 2017-09-26 DIAGNOSIS — Z87891 Personal history of nicotine dependence: Secondary | ICD-10-CM

## 2017-09-26 DIAGNOSIS — I5033 Acute on chronic diastolic (congestive) heart failure: Secondary | ICD-10-CM

## 2017-09-26 DIAGNOSIS — N183 Chronic kidney disease, stage 3 (moderate): Secondary | ICD-10-CM | POA: Diagnosis not present

## 2017-09-26 DIAGNOSIS — Z951 Presence of aortocoronary bypass graft: Secondary | ICD-10-CM

## 2017-09-26 DIAGNOSIS — Z85118 Personal history of other malignant neoplasm of bronchus and lung: Secondary | ICD-10-CM

## 2017-09-26 DIAGNOSIS — I2510 Atherosclerotic heart disease of native coronary artery without angina pectoris: Secondary | ICD-10-CM

## 2017-09-26 DIAGNOSIS — E440 Moderate protein-calorie malnutrition: Secondary | ICD-10-CM | POA: Diagnosis not present

## 2017-09-26 DIAGNOSIS — Z8701 Personal history of pneumonia (recurrent): Secondary | ICD-10-CM

## 2017-09-26 DIAGNOSIS — F411 Generalized anxiety disorder: Secondary | ICD-10-CM

## 2017-09-30 ENCOUNTER — Ambulatory Visit: Payer: Medicare HMO | Admitting: Cardiovascular Disease

## 2017-09-30 DIAGNOSIS — D631 Anemia in chronic kidney disease: Secondary | ICD-10-CM | POA: Diagnosis not present

## 2017-09-30 DIAGNOSIS — N183 Chronic kidney disease, stage 3 (moderate): Secondary | ICD-10-CM | POA: Diagnosis not present

## 2017-09-30 DIAGNOSIS — I130 Hypertensive heart and chronic kidney disease with heart failure and stage 1 through stage 4 chronic kidney disease, or unspecified chronic kidney disease: Secondary | ICD-10-CM | POA: Diagnosis not present

## 2017-09-30 DIAGNOSIS — E440 Moderate protein-calorie malnutrition: Secondary | ICD-10-CM | POA: Diagnosis not present

## 2017-09-30 DIAGNOSIS — R1310 Dysphagia, unspecified: Secondary | ICD-10-CM | POA: Diagnosis not present

## 2017-09-30 DIAGNOSIS — I5033 Acute on chronic diastolic (congestive) heart failure: Secondary | ICD-10-CM | POA: Diagnosis not present

## 2017-09-30 DIAGNOSIS — F0390 Unspecified dementia without behavioral disturbance: Secondary | ICD-10-CM | POA: Diagnosis not present

## 2017-09-30 DIAGNOSIS — J439 Emphysema, unspecified: Secondary | ICD-10-CM | POA: Diagnosis not present

## 2017-09-30 DIAGNOSIS — I2510 Atherosclerotic heart disease of native coronary artery without angina pectoris: Secondary | ICD-10-CM | POA: Diagnosis not present

## 2017-10-01 ENCOUNTER — Encounter: Payer: Self-pay | Admitting: Cardiovascular Disease

## 2017-10-01 ENCOUNTER — Ambulatory Visit (INDEPENDENT_AMBULATORY_CARE_PROVIDER_SITE_OTHER): Payer: Medicare HMO | Admitting: Cardiovascular Disease

## 2017-10-01 VITALS — BP 138/52 | HR 53 | Ht 67.0 in | Wt 122.0 lb

## 2017-10-01 DIAGNOSIS — I5033 Acute on chronic diastolic (congestive) heart failure: Secondary | ICD-10-CM | POA: Diagnosis not present

## 2017-10-01 DIAGNOSIS — R0989 Other specified symptoms and signs involving the circulatory and respiratory systems: Secondary | ICD-10-CM

## 2017-10-01 DIAGNOSIS — D631 Anemia in chronic kidney disease: Secondary | ICD-10-CM | POA: Diagnosis not present

## 2017-10-01 DIAGNOSIS — I10 Essential (primary) hypertension: Secondary | ICD-10-CM | POA: Diagnosis not present

## 2017-10-01 DIAGNOSIS — I5032 Chronic diastolic (congestive) heart failure: Secondary | ICD-10-CM

## 2017-10-01 DIAGNOSIS — N183 Chronic kidney disease, stage 3 unspecified: Secondary | ICD-10-CM

## 2017-10-01 DIAGNOSIS — J439 Emphysema, unspecified: Secondary | ICD-10-CM | POA: Diagnosis not present

## 2017-10-01 DIAGNOSIS — I25708 Atherosclerosis of coronary artery bypass graft(s), unspecified, with other forms of angina pectoris: Secondary | ICD-10-CM | POA: Diagnosis not present

## 2017-10-01 DIAGNOSIS — I130 Hypertensive heart and chronic kidney disease with heart failure and stage 1 through stage 4 chronic kidney disease, or unspecified chronic kidney disease: Secondary | ICD-10-CM | POA: Diagnosis not present

## 2017-10-01 DIAGNOSIS — I2510 Atherosclerotic heart disease of native coronary artery without angina pectoris: Secondary | ICD-10-CM | POA: Diagnosis not present

## 2017-10-01 DIAGNOSIS — F0390 Unspecified dementia without behavioral disturbance: Secondary | ICD-10-CM | POA: Diagnosis not present

## 2017-10-01 DIAGNOSIS — R1310 Dysphagia, unspecified: Secondary | ICD-10-CM | POA: Diagnosis not present

## 2017-10-01 DIAGNOSIS — E440 Moderate protein-calorie malnutrition: Secondary | ICD-10-CM | POA: Diagnosis not present

## 2017-10-01 NOTE — Progress Notes (Signed)
CARDIOLOGY CONSULT NOTE  Patient ID: Steven [name] MRN: [id number] DOB/AGE: [birth year] 84 y.o.  Admit date: (Not on file) Primary Physician: [name], [name] Referring Physician: Hilbert Corrigan  Reason for Consultation: CABG, CHF  HPI: Steven [name] is a 84 y.o. male who is being seen today for the evaluation of CABG and CHF at the request of Hilbert Corrigan*.   He was hospitalized most recently in late July at [facility] for acute on chronic diastolic heart failure.  He was hospitalized prior to this in early July for pneumonia with acute respiratory failure with hypoxia and hypercapnia and a COPD exacerbation.  He also had aspiration pneumonia.  Past medical history includes coronary artery disease with 5 vessel CABG in 1999, chronic kidney disease stage III, anemia of chronic disease, COPD, hypertension, and dementia.  Echocardiogram 08/28/2017: Vigorous left ventricular systolic function, LVEF 65 to 70%, indeterminate diastolic function.  CT angiography of the chest on 08/27/2017 also showed reflux of contrast material into the hepatic veins suggestive of right heart failure.  Hemoglobin 9.4 on 09/24/2017.  BUN 21 and creatinine 1.56 on 09/24/2017.  While hospitalized in July, troponins peaked at 0.1.  Most recent ECG on 08/28/2017 demonstrated sinus rhythm with PACs and left axis deviation.  Discharge weight 122 pounds on 08/30/2017.  He is here with his son, [name].  The patient apparently lives independently in an apartment in [place].  He cooks eggs and toast for himself and meets his buddies at [employer] [facility] [employer] [employer] every morning for coffee and a biscuit.  He quit smoking about 11 years ago.  He denies chest pain, palpitations, shortness of breath, dizziness, leg swelling, orthopnea, and paroxysmal nocturnal dyspnea.  He has bilateral leg pain and weakness.  He had been tried on gabapentin but it led to somnolence and it was discontinued while  hospitalized.  He had been on low-dose atorvastatin and this was also stopped.   No Known Allergies  Current Outpatient Medications  Medication Sig Dispense Refill  . albuterol (PROVENTIL) (2.5 MG/3ML) 0.083% nebulizer solution Take 3 mLs (2.5 mg total) by nebulization every 2 (two) hours as needed for wheezing or shortness of breath. 75 mL 12  . aspirin 81 MG tablet Take 81 mg by mouth daily.     . calcium carbonate (TUMS - DOSED IN MG ELEMENTAL CALCIUM) 500 MG chewable tablet Chew 1 tablet by mouth daily.    . clonazePAM (KLONOPIN) 1 MG tablet Take 0.5 tablets (0.5 mg total) by mouth 3 (three) times daily as needed. 10 tablet 0  . diltiazem (CARDIZEM CD) 120 MG 24 hr capsule Take 1 capsule (120 mg total) by mouth daily.    [name] doxazosin (CARDURA) 2 MG tablet Take 1 tablet (2 mg total) by mouth daily. 90 tablet 1  . furosemide (LASIX) 20 MG tablet Take 1 tablet (20 mg total) by mouth every other day.    . ipratropium-albuterol (DUONEB) 0.5-2.5 (3) MG/3ML SOLN Take 3 mLs by nebulization 3 (three) times daily. 360 mL   . iron polysaccharides (NIFEREX) 150 MG capsule Take 1 capsule (150 mg total) by mouth daily.     No current facility-administered medications for this visit.     Past Medical History:  Diagnosis Date  . Anxiety   . Cancer [facility]) 2013   Lung - cancer free x3 years.   . Hyperlipidemia   . Hypertension   . Myocardial infarction ([place]) 1999    Past Surgical History:  Procedure Laterality Date  . CARPAL TUNNEL RELEASE  1980's   right and left  . CHOLECYSTECTOMY    . COLONOSCOPY N/A 09/29/2014   Procedure: COLONOSCOPY;  Surgeon: Rogene Houston, MD;  Location: [facility];  Service: Endoscopy;  Laterality: N/A;  . CORONARY ARTERY BYPASS GRAFT  1999   5 grafts  . FLEXIBLE BRONCHOSCOPY  11/07/2011   Procedure: FLEXIBLE BRONCHOSCOPY;  Surgeon: Gaye Pollack, MD;  Location: [place];  Service: Thoracic;  Laterality: N/A;  . HERNIA REPAIR  1761   umbilical hernia repair  .  JOINT REPLACEMENT  2000   right shoulder rotator cuff repair    Social History   Socioeconomic History  . Marital status: Divorced    Spouse name: Not on file  . Number of children: Not on file  . Years of education: Not on file  . Highest education level: Not on file  Occupational History  . Not on file  Social Needs  . Financial resource strain: Not on file  . Food insecurity:    Worry: Not on file    Inability: Not on file  . Transportation needs:    Medical: Not on file    Non-medical: Not on file  Tobacco Use  . Smoking status: Former Smoker    Packs/day: 2.00    Years: 73.00    Pack years: 146.00    Types: Cigarettes    Start date: 02/05/2006    Last attempt to quit: 11/05/2006    Years since quitting: 10.9  . Smokeless tobacco: Never Used  Substance and Sexual Activity  . Alcohol use: No  . Drug use: No  . Sexual activity: Not on file  Lifestyle  . Physical activity:    Days per week: Not on file    Minutes per session: Not on file  . Stress: Not on file  Relationships  . Social connections:    Talks on phone: Not on file    Gets together: Not on file    Attends religious service: Not on file    Active member of club or organization: Not on file    Attends meetings of clubs or organizations: Not on file    Relationship status: Not on file  . Intimate partner violence:    Fear of current or ex partner: Not on file    Emotionally abused: Not on file    Physically abused: Not on file    Forced sexual activity: Not on file  Other Topics Concern  . Not on file  Social History Narrative  . Not on file     No family history of premature CAD in 1st degree relatives.  Current Meds  Medication Sig  . albuterol (PROVENTIL) (2.5 MG/3ML) 0.083% nebulizer solution Take 3 mLs (2.5 mg total) by nebulization every 2 (two) hours as needed for wheezing or shortness of breath.  [name] aspirin 81 MG tablet Take 81 mg by mouth daily.   . calcium carbonate (TUMS - DOSED IN  MG ELEMENTAL CALCIUM) 500 MG chewable tablet Chew 1 tablet by mouth daily.  . clonazePAM (KLONOPIN) 1 MG tablet Take 0.5 tablets (0.5 mg total) by mouth 3 (three) times daily as needed.  . diltiazem (CARDIZEM CD) 120 MG 24 hr capsule Take 1 capsule (120 mg total) by mouth daily.  [name] doxazosin (CARDURA) 2 MG tablet Take 1 tablet (2 mg total) by mouth daily.  . furosemide (LASIX) 20 MG tablet Take 1 tablet (20 mg total) by mouth every  other day.  . ipratropium-albuterol (DUONEB) 0.5-2.5 (3) MG/3ML SOLN Take 3 mLs by nebulization 3 (three) times daily.  . iron polysaccharides (NIFEREX) 150 MG capsule Take 1 capsule (150 mg total) by mouth daily.      Review of systems complete and found to be negative unless listed above in HPI    Physical exam Blood pressure (!) 138/52, pulse (!) 53, height 5\' 7"  (1.702 m), weight 122 lb (55.3 kg), SpO2 97 %. General: NAD Neck: No JVD, no thyromegaly or thyroid nodule.  Lungs: Clear to auscultation bilaterally with normal respiratory effort. CV: Nondisplaced PMI. Regular rate and rhythm with premature contractions, normal S1/S2, no S3/S4, no murmur.  No peripheral edema.  Loud right carotid bruit and mild left carotid bruit.    Abdomen: Soft, nontender, no distention.  Skin: Intact without lesions or rashes.  Neurologic: Alert and oriented x 3.  Psych: Normal affect. Extremities: No clubbing or cyanosis.  HEENT: Normal.   ECG: Most recent ECG reviewed.   Labs:    Lipids: Lab Results  Component Value Date/Time   LDLCALC 56 05/01/2017 10:16 AM   LDLCALC 56 08/27/2013 10:03 AM   LDLCALC 58 08/22/2012 11:35 AM   CHOL 129 05/01/2017 10:16 AM   CHOL 124 08/22/2012 11:35 AM   TRIG 87 05/01/2017 10:16 AM   TRIG 88 03/17/2014 09:05 AM   TRIG 135 08/22/2012 11:35 AM   HDL 56 05/01/2017 10:16 AM   HDL 46 03/17/2014 09:05 AM   HDL 39 (L) 08/22/2012 11:35 AM        ASSESSMENT AND PLAN:   1.  Chronic diastolic heart failure: Symptomatically  stable.  Euvolemic on Lasix 20 mg every other day.  Blood pressure is normal.  No changes to therapy.  2.  Coronary disease with history of 5 vessel CABG: Left ventricular systolic function is normal by most recent echocardiogram.  Continue aspirin.  No beta-blockers due to resting bradycardia.  Presumably not on statin therapy due to bilateral leg weakness.  3.  Hypertension: Controlled on present therapy which includes long-acting diltiazem 120 mg daily and Cardura 2 mg daily.  No changes.  4.  Chronic kidney disease stage III: Most recent labs reviewed above.  Stable.  5.  Right carotid bruit: I will obtain carotid Dopplers.   Disposition: Follow up in 3 months  Signed: Kate Sable, M.D., F.A.C.C.  10/01/2017, 8:38 AM

## 2017-10-01 NOTE — Patient Instructions (Signed)
Medication Instructions:  Continue all current medications.  Labwork: none  Testing/Procedures:  Your physician has requested that you have a carotid duplex. This test is an ultrasound of the carotid arteries in your neck. It looks at blood flow through these arteries that supply the brain with blood. Allow one hour for this exam. There are no restrictions or special instructions.  Office will contact with results via phone or letter.    Follow-Up: 3 months   Any Other Special Instructions Will Be Listed Below (If Applicable).  If you need a refill on your cardiac medications before your next appointment, please call your pharmacy.

## 2017-10-03 ENCOUNTER — Ambulatory Visit (INDEPENDENT_AMBULATORY_CARE_PROVIDER_SITE_OTHER): Payer: Medicare HMO

## 2017-10-03 DIAGNOSIS — R0989 Other specified symptoms and signs involving the circulatory and respiratory systems: Secondary | ICD-10-CM | POA: Diagnosis not present

## 2017-10-04 DIAGNOSIS — N183 Chronic kidney disease, stage 3 (moderate): Secondary | ICD-10-CM | POA: Diagnosis not present

## 2017-10-04 DIAGNOSIS — E440 Moderate protein-calorie malnutrition: Secondary | ICD-10-CM | POA: Diagnosis not present

## 2017-10-04 DIAGNOSIS — F0390 Unspecified dementia without behavioral disturbance: Secondary | ICD-10-CM | POA: Diagnosis not present

## 2017-10-04 DIAGNOSIS — D631 Anemia in chronic kidney disease: Secondary | ICD-10-CM | POA: Diagnosis not present

## 2017-10-04 DIAGNOSIS — J439 Emphysema, unspecified: Secondary | ICD-10-CM | POA: Diagnosis not present

## 2017-10-04 DIAGNOSIS — I5033 Acute on chronic diastolic (congestive) heart failure: Secondary | ICD-10-CM | POA: Diagnosis not present

## 2017-10-04 DIAGNOSIS — R1310 Dysphagia, unspecified: Secondary | ICD-10-CM | POA: Diagnosis not present

## 2017-10-04 DIAGNOSIS — I2510 Atherosclerotic heart disease of native coronary artery without angina pectoris: Secondary | ICD-10-CM | POA: Diagnosis not present

## 2017-10-04 DIAGNOSIS — I130 Hypertensive heart and chronic kidney disease with heart failure and stage 1 through stage 4 chronic kidney disease, or unspecified chronic kidney disease: Secondary | ICD-10-CM | POA: Diagnosis not present

## 2017-10-08 ENCOUNTER — Telehealth: Payer: Self-pay | Admitting: *Deleted

## 2017-10-08 NOTE — Telephone Encounter (Signed)
Notes recorded by Laurine Blazer, LPN on 09/06/1570 at 6:20 AM EDT Son Clearence Cheek [place]) notified. Follow up scheduled for December. Copy to pmd. ------  Notes recorded by Laurine Blazer, LPN on 04/09/5972 at 1:63 AM EDT Left message to return call.  ------  Notes recorded by Herminio Commons, MD on 10/04/2017 at 9:07 AM EDT Mild to moderate right internal carotid artery blockage and right subclavian artery blockage. I will monitor.

## 2017-10-09 DIAGNOSIS — N183 Chronic kidney disease, stage 3 (moderate): Secondary | ICD-10-CM | POA: Diagnosis not present

## 2017-10-09 DIAGNOSIS — F0390 Unspecified dementia without behavioral disturbance: Secondary | ICD-10-CM | POA: Diagnosis not present

## 2017-10-09 DIAGNOSIS — I2510 Atherosclerotic heart disease of native coronary artery without angina pectoris: Secondary | ICD-10-CM | POA: Diagnosis not present

## 2017-10-09 DIAGNOSIS — E440 Moderate protein-calorie malnutrition: Secondary | ICD-10-CM | POA: Diagnosis not present

## 2017-10-09 DIAGNOSIS — D631 Anemia in chronic kidney disease: Secondary | ICD-10-CM | POA: Diagnosis not present

## 2017-10-09 DIAGNOSIS — J439 Emphysema, unspecified: Secondary | ICD-10-CM | POA: Diagnosis not present

## 2017-10-09 DIAGNOSIS — R1310 Dysphagia, unspecified: Secondary | ICD-10-CM | POA: Diagnosis not present

## 2017-10-09 DIAGNOSIS — I5033 Acute on chronic diastolic (congestive) heart failure: Secondary | ICD-10-CM | POA: Diagnosis not present

## 2017-10-09 DIAGNOSIS — I130 Hypertensive heart and chronic kidney disease with heart failure and stage 1 through stage 4 chronic kidney disease, or unspecified chronic kidney disease: Secondary | ICD-10-CM | POA: Diagnosis not present

## 2017-10-10 DIAGNOSIS — D631 Anemia in chronic kidney disease: Secondary | ICD-10-CM | POA: Diagnosis not present

## 2017-10-10 DIAGNOSIS — I5033 Acute on chronic diastolic (congestive) heart failure: Secondary | ICD-10-CM | POA: Diagnosis not present

## 2017-10-10 DIAGNOSIS — J439 Emphysema, unspecified: Secondary | ICD-10-CM | POA: Diagnosis not present

## 2017-10-10 DIAGNOSIS — I2510 Atherosclerotic heart disease of native coronary artery without angina pectoris: Secondary | ICD-10-CM | POA: Diagnosis not present

## 2017-10-10 DIAGNOSIS — I130 Hypertensive heart and chronic kidney disease with heart failure and stage 1 through stage 4 chronic kidney disease, or unspecified chronic kidney disease: Secondary | ICD-10-CM | POA: Diagnosis not present

## 2017-10-10 DIAGNOSIS — E440 Moderate protein-calorie malnutrition: Secondary | ICD-10-CM | POA: Diagnosis not present

## 2017-10-10 DIAGNOSIS — F0390 Unspecified dementia without behavioral disturbance: Secondary | ICD-10-CM | POA: Diagnosis not present

## 2017-10-10 DIAGNOSIS — R1310 Dysphagia, unspecified: Secondary | ICD-10-CM | POA: Diagnosis not present

## 2017-10-10 DIAGNOSIS — N183 Chronic kidney disease, stage 3 (moderate): Secondary | ICD-10-CM | POA: Diagnosis not present

## 2017-10-11 ENCOUNTER — Other Ambulatory Visit: Payer: Self-pay

## 2017-10-11 DIAGNOSIS — D631 Anemia in chronic kidney disease: Secondary | ICD-10-CM | POA: Diagnosis not present

## 2017-10-11 DIAGNOSIS — E440 Moderate protein-calorie malnutrition: Secondary | ICD-10-CM | POA: Diagnosis not present

## 2017-10-11 DIAGNOSIS — N183 Chronic kidney disease, stage 3 (moderate): Secondary | ICD-10-CM | POA: Diagnosis not present

## 2017-10-11 DIAGNOSIS — J439 Emphysema, unspecified: Secondary | ICD-10-CM | POA: Diagnosis not present

## 2017-10-11 DIAGNOSIS — I130 Hypertensive heart and chronic kidney disease with heart failure and stage 1 through stage 4 chronic kidney disease, or unspecified chronic kidney disease: Secondary | ICD-10-CM | POA: Diagnosis not present

## 2017-10-11 DIAGNOSIS — I2510 Atherosclerotic heart disease of native coronary artery without angina pectoris: Secondary | ICD-10-CM | POA: Diagnosis not present

## 2017-10-11 DIAGNOSIS — I5033 Acute on chronic diastolic (congestive) heart failure: Secondary | ICD-10-CM | POA: Diagnosis not present

## 2017-10-11 DIAGNOSIS — F0390 Unspecified dementia without behavioral disturbance: Secondary | ICD-10-CM | POA: Diagnosis not present

## 2017-10-11 DIAGNOSIS — R1310 Dysphagia, unspecified: Secondary | ICD-10-CM | POA: Diagnosis not present

## 2017-10-11 MED ORDER — DILTIAZEM HCL ER COATED BEADS 120 MG PO CP24: 120.0000 mg | ORAL_CAPSULE | Freq: Every day | ORAL | 0 refills | Status: DC

## 2017-10-11 MED ORDER — FUROSEMIDE 20 MG PO TABS: 20.0000 mg | ORAL_TABLET | ORAL | 1 refills | Status: DC

## 2017-10-11 NOTE — Telephone Encounter (Signed)
Patient needs refill of Furosemind, Niferex, and Diltiazem.  Please advise.

## 2017-10-12 NOTE — Telephone Encounter (Signed)
Does not want to reschedule at this time

## 2017-10-14 ENCOUNTER — Other Ambulatory Visit: Payer: Self-pay | Admitting: Nurse Practitioner

## 2017-10-14 ENCOUNTER — Telehealth: Payer: Self-pay | Admitting: *Deleted

## 2017-10-14 DIAGNOSIS — D631 Anemia in chronic kidney disease: Secondary | ICD-10-CM | POA: Diagnosis not present

## 2017-10-14 DIAGNOSIS — R1310 Dysphagia, unspecified: Secondary | ICD-10-CM | POA: Diagnosis not present

## 2017-10-14 DIAGNOSIS — E440 Moderate protein-calorie malnutrition: Secondary | ICD-10-CM | POA: Diagnosis not present

## 2017-10-14 DIAGNOSIS — I5033 Acute on chronic diastolic (congestive) heart failure: Secondary | ICD-10-CM | POA: Diagnosis not present

## 2017-10-14 DIAGNOSIS — I130 Hypertensive heart and chronic kidney disease with heart failure and stage 1 through stage 4 chronic kidney disease, or unspecified chronic kidney disease: Secondary | ICD-10-CM | POA: Diagnosis not present

## 2017-10-14 DIAGNOSIS — N183 Chronic kidney disease, stage 3 (moderate): Secondary | ICD-10-CM | POA: Diagnosis not present

## 2017-10-14 DIAGNOSIS — I2510 Atherosclerotic heart disease of native coronary artery without angina pectoris: Secondary | ICD-10-CM | POA: Diagnosis not present

## 2017-10-14 DIAGNOSIS — J439 Emphysema, unspecified: Secondary | ICD-10-CM | POA: Diagnosis not present

## 2017-10-14 DIAGNOSIS — N400 Enlarged prostate without lower urinary tract symptoms: Secondary | ICD-10-CM

## 2017-10-14 DIAGNOSIS — F0390 Unspecified dementia without behavioral disturbance: Secondary | ICD-10-CM | POA: Diagnosis not present

## 2017-10-14 MED ORDER — DOXAZOSIN MESYLATE 2 MG PO TABS: 2.0000 mg | ORAL_TABLET | Freq: Every day | ORAL | 1 refills | Status: DC

## 2017-10-14 MED ORDER — FUROSEMIDE 20 MG PO TABS: 20.0000 mg | ORAL_TABLET | ORAL | 1 refills | Status: DC

## 2017-10-14 NOTE — Telephone Encounter (Signed)
Lasix should be daily- I will send in new rx along with cardura rx

## 2017-10-14 NOTE — Progress Notes (Signed)
after review of chart should be on lasix 20mg  every other day as ordered for now

## 2017-10-14 NOTE — Telephone Encounter (Signed)
FYI TC fr Bethena Roys w/ [facility] Wanted to clarify Furosemide 20 mg sig, he has been taking QD and refill this weekend was for QOD, verified that sig from hospital and what we refilled was for QOD. He has also taken 2 a day sometimes. Nurse just wants to let you know that he will not be able to refill his Cardura till the 12th, a couple of his bottles have been thrown away since her last visit. She has strongly encouraged the son, over the last 3 weeks, to take over his medications, or go to pre-package.

## 2017-10-15 NOTE — Progress Notes (Signed)
Left message for son to call back

## 2017-10-16 ENCOUNTER — Other Ambulatory Visit: Payer: Self-pay | Admitting: Nurse Practitioner

## 2017-10-16 DIAGNOSIS — M25562 Pain in left knee: Principal | ICD-10-CM

## 2017-10-16 DIAGNOSIS — M25561 Pain in right knee: Secondary | ICD-10-CM

## 2017-10-18 DIAGNOSIS — I130 Hypertensive heart and chronic kidney disease with heart failure and stage 1 through stage 4 chronic kidney disease, or unspecified chronic kidney disease: Secondary | ICD-10-CM | POA: Diagnosis not present

## 2017-10-18 DIAGNOSIS — N183 Chronic kidney disease, stage 3 (moderate): Secondary | ICD-10-CM | POA: Diagnosis not present

## 2017-10-18 DIAGNOSIS — F0390 Unspecified dementia without behavioral disturbance: Secondary | ICD-10-CM | POA: Diagnosis not present

## 2017-10-18 DIAGNOSIS — D631 Anemia in chronic kidney disease: Secondary | ICD-10-CM | POA: Diagnosis not present

## 2017-10-18 DIAGNOSIS — R1310 Dysphagia, unspecified: Secondary | ICD-10-CM | POA: Diagnosis not present

## 2017-10-18 DIAGNOSIS — I2510 Atherosclerotic heart disease of native coronary artery without angina pectoris: Secondary | ICD-10-CM | POA: Diagnosis not present

## 2017-10-18 DIAGNOSIS — I5033 Acute on chronic diastolic (congestive) heart failure: Secondary | ICD-10-CM | POA: Diagnosis not present

## 2017-10-18 DIAGNOSIS — E440 Moderate protein-calorie malnutrition: Secondary | ICD-10-CM | POA: Diagnosis not present

## 2017-10-18 DIAGNOSIS — J439 Emphysema, unspecified: Secondary | ICD-10-CM | POA: Diagnosis not present

## 2017-11-04 ENCOUNTER — Ambulatory Visit (INDEPENDENT_AMBULATORY_CARE_PROVIDER_SITE_OTHER): Payer: Medicare HMO | Admitting: Nurse Practitioner

## 2017-11-04 ENCOUNTER — Encounter: Payer: Self-pay | Admitting: Nurse Practitioner

## 2017-11-04 VITALS — BP 92/42 | HR 58 | Temp 97.2°F | Ht 67.0 in | Wt 128.0 lb

## 2017-11-04 DIAGNOSIS — J439 Emphysema, unspecified: Secondary | ICD-10-CM

## 2017-11-04 DIAGNOSIS — E785 Hyperlipidemia, unspecified: Secondary | ICD-10-CM | POA: Diagnosis not present

## 2017-11-04 DIAGNOSIS — I5033 Acute on chronic diastolic (congestive) heart failure: Secondary | ICD-10-CM

## 2017-11-04 DIAGNOSIS — R6889 Other general symptoms and signs: Secondary | ICD-10-CM | POA: Diagnosis not present

## 2017-11-04 DIAGNOSIS — I10 Essential (primary) hypertension: Secondary | ICD-10-CM

## 2017-11-04 DIAGNOSIS — D631 Anemia in chronic kidney disease: Secondary | ICD-10-CM | POA: Diagnosis not present

## 2017-11-04 DIAGNOSIS — N183 Chronic kidney disease, stage 3 unspecified: Secondary | ICD-10-CM

## 2017-11-04 DIAGNOSIS — N400 Enlarged prostate without lower urinary tract symptoms: Secondary | ICD-10-CM

## 2017-11-04 DIAGNOSIS — F0391 Unspecified dementia with behavioral disturbance: Secondary | ICD-10-CM

## 2017-11-04 MED ORDER — DOXAZOSIN MESYLATE 2 MG PO TABS: 2.0000 mg | ORAL_TABLET | Freq: Every day | ORAL | 1 refills | Status: DC

## 2017-11-04 MED ORDER — DILTIAZEM HCL ER COATED BEADS 120 MG PO CP24: 120.0000 mg | ORAL_CAPSULE | Freq: Every day | ORAL | 0 refills | Status: DC

## 2017-11-04 MED ORDER — IPRATROPIUM-ALBUTEROL 0.5-2.5 (3) MG/3ML IN SOLN: 3.0000 mL | Freq: Three times a day (TID) | RESPIRATORY_TRACT | Status: AC

## 2017-11-04 MED ORDER — POLYSACCHARIDE IRON COMPLEX 150 MG PO CAPS: 150.0000 mg | ORAL_CAPSULE | Freq: Every day | ORAL | Status: DC

## 2017-11-04 NOTE — Progress Notes (Signed)
Subjective:    Patient ID: Steven [name], male    DOB: 10-26-1928, 84 y.o.   MRN: [id number]   Chief Complaint: Medical Management of Chronic Issues  HPI:  1. Essential hypertension  -no c/o CP/SOB/HA -does not check BP at home -does not watch salt intake, eats out of a can most of the time BP Readings from Last 3 Encounters:  11/04/17 (!) 92/42  10/01/17 (!) 138/52  09/24/17 (!) 100/52    2. Acute on chronic diastolic congestive heart failure ([place])  -does not weigh himself daily -followed by Dr. Bronson Ing (cardiology)  3. Pulmonary emphysema, unspecified emphysema type ([place])  -no breathing issues currently -not on home oxygen  4. Dementia with behavioral disturbance, unspecified dementia type ([place])  -a/ox4 -lives by himself & still drives  5. Hyperlipidemia with target LDL less than 100  -does not watch fat intake in diet -not on statin due to lower extremity myalgias  6. CKD (chronic kidney disease), stage III (HCC)  -Last creat was 1.56 on 09/24/17   7. Benign prostatic hyperplasia without lower urinary tract symptoms  -no issues starting and stopping stream    Outpatient Encounter Medications as of 11/04/2017  Medication Sig  . albuterol (PROVENTIL) (2.5 MG/3ML) 0.083% nebulizer solution Take 3 mLs (2.5 mg total) by nebulization every 2 (two) hours as needed for wheezing or shortness of breath.  [name] aspirin 81 MG tablet Take 81 mg by mouth daily.   . calcium carbonate (TUMS - DOSED IN MG ELEMENTAL CALCIUM) 500 MG chewable tablet Chew 1 tablet by mouth daily.  . clonazePAM (KLONOPIN) 1 MG tablet Take 0.5 tablets (0.5 mg total) by mouth 3 (three) times daily as needed.  . diltiazem (CARDIZEM CD) 120 MG 24 hr capsule Take 1 capsule (120 mg total) by mouth daily.  [name] doxazosin (CARDURA) 2 MG tablet Take 1 tablet (2 mg total) by mouth daily.  . furosemide (LASIX) 20 MG tablet Take 1 tablet (20 mg total) by mouth every other day.  . ipratropium-albuterol (DUONEB) 0.5-2.5 (3)  MG/3ML SOLN Take 3 mLs by nebulization 3 (three) times daily.  . iron polysaccharides (NIFEREX) 150 MG capsule Take 1 capsule (150 mg total) by mouth daily.  . meloxicam (MOBIC) 15 MG tablet TAKE 1 TABLET BY MOUTH EVERY DAY   No facility-administered encounter medications on file as of 11/04/2017.     New complaints: None today  Social history: Lives by himself, says his family does not come around unless he calls them or if he is sick  Review of Systems  Constitutional: Negative for activity change, appetite change, chills, fatigue, fever and unexpected weight change.  HENT: Negative for congestion, ear pain, rhinorrhea, sinus pressure, sinus pain and sore throat.   Eyes: Negative for pain, redness and visual disturbance.  Respiratory: Negative for cough, chest tightness, shortness of breath and wheezing.   Cardiovascular: Negative for chest pain, palpitations and leg swelling.  Gastrointestinal: Negative for abdominal pain, constipation, diarrhea, nausea and vomiting.  Endocrine: Negative for cold intolerance, heat intolerance, polydipsia, polyphagia and polyuria.  Genitourinary: Negative for difficulty urinating, dysuria and urgency.  Musculoskeletal: Negative for arthralgias, gait problem, joint swelling and myalgias.  Skin: Negative for rash and wound.  Allergic/Immunologic: Negative for environmental allergies and food allergies.  Neurological: Negative for dizziness, tremors, weakness and numbness.  Hematological: Does not bruise/bleed easily.  Psychiatric/Behavioral: Negative for behavioral problems, confusion, decreased concentration, sleep disturbance and suicidal ideas. The patient is not nervous/anxious.  Objective:   Physical Exam  Constitutional: He is oriented to person, place, and time. He appears well-developed and well-nourished.  HENT:  Head: Normocephalic and atraumatic.  Right Ear: External ear normal.  Left Ear: External ear normal.  Nose: Nose normal.   Mouth/Throat: Oropharynx is clear and moist. No oropharyngeal exudate.  Eyes: Pupils are equal, round, and reactive to light. Conjunctivae and EOM are normal.  Neck: Normal range of motion. Neck supple. Carotid bruit is present. No thyromegaly present.  Cardiovascular: Normal rate, regular rhythm, normal heart sounds and intact distal pulses.  Pulses:      Carotid pulses are on the right side with bruit, and on the left side with bruit. Pulmonary/Chest: Effort normal. He has decreased breath sounds in the right lower field and the left lower field.  Abdominal: Soft. Bowel sounds are normal.  Musculoskeletal: Normal range of motion.  Neurological: He is alert and oriented to person, place, and time. He has normal strength. He displays normal reflexes. No cranial nerve deficit.  Skin: Skin is warm and dry. Capillary refill takes 2 to 3 seconds. There is pallor.  Psychiatric: He has a normal mood and affect. His speech is normal and behavior is normal. Judgment and thought content normal.  Nursing note and vitals reviewed.  BP (!) 108/40   Pulse (!) 58   Temp (!) 97.2 F (36.2 C) (Oral)   Ht 5' 7"  (1.702 m)   Wt 128 lb (58.1 kg)   BMI 20.05 kg/m      Assessment & Plan:  Steven [name] comes in today with chief complaint of Medical Management of Chronic Issues   Diagnosis and orders addressed:  1. Essential hypertension Low sodium diet - CMP14+EGFR  2. Acute on chronic diastolic congestive heart failure ([place]) Continue cardizem as rx Keep follow up with cardiology  3. Pulmonary emphysema, unspecified emphysema type ([place]) No smoking  4. Dementia with behavioral disturbance, unspecified dementia type ([place]) [place] daily  5. Hyperlipidemia with target LDL less than 100 Low fat diet - Lipid panel  6. CKD (chronic kidney disease), stage III ([place]) Labs pending  7. Benign prostatic hyperplasia without lower urinary tract symptoms - doxazosin (CARDURA) 2 MG tablet; Take 1  tablet (2 mg total) by mouth daily.  Dispense: 90 tablet; Refill: 1  8. Anemia due to stage 3 chronic kidney disease ([place]) Labs pending - Anemia Profile B  Needs family to check on him daily- patient says he will talk with family Labs pending Health Maintenance reviewed Diet and exercise encouraged  Follow up plan: 3 months   [place], [name]

## 2017-11-04 NOTE — Patient Instructions (Addendum)

## 2017-11-04 NOTE — Addendum Note (Signed)
Addended by: Antonietta Barcelona D on: 11/04/2017 11:25 AM   Modules accepted: Orders

## 2017-11-05 ENCOUNTER — Other Ambulatory Visit: Payer: Self-pay | Admitting: Nurse Practitioner

## 2017-11-05 DIAGNOSIS — R748 Abnormal levels of other serum enzymes: Secondary | ICD-10-CM

## 2017-11-05 LAB — LIPID PANEL
Chol/HDL Ratio: 2.8 ratio (ref 0.0–5.0)
Cholesterol, Total: 149 mg/dL (ref 100–199)
HDL: 53 mg/dL (ref >39)
LDL Calculated: 75 mg/dL (ref 0–99)
Triglycerides: 106 mg/dL (ref 0–149)
VLDL Cholesterol Cal: 21 mg/dL (ref 5–40)

## 2017-11-05 LAB — ANEMIA PROFILE B
Basophils Absolute: 0.1 10*3/uL (ref 0.0–0.2)
Basos: 1 %
EOS (ABSOLUTE): 0.4 10*3/uL (ref 0.0–0.4)
Eos: 7 %
Ferritin: 26 ng/mL — ABNORMAL LOW (ref 30–400)
Folate: 18.3 ng/mL (ref >3.0)
Hematocrit: 28.1 % — ABNORMAL LOW (ref 37.5–51.0)
Hemoglobin: 9 g/dL — ABNORMAL LOW (ref 13.0–17.7)
Immature Grans (Abs): 0 10*3/uL (ref 0.0–0.1)
Immature Granulocytes: 0 %
Iron Saturation: 23 % (ref 15–55)
Iron: 69 ug/dL (ref 38–169)
Lymphocytes Absolute: 1.8 10*3/uL (ref 0.7–3.1)
Lymphs: 39 %
MCH: 29.1 pg (ref 26.6–33.0)
MCHC: 32 g/dL (ref 31.5–35.7)
MCV: 91 fL (ref 79–97)
Monocytes Absolute: 0.4 10*3/uL (ref 0.1–0.9)
Monocytes: 8 %
Neutrophils Absolute: 2.1 10*3/uL (ref 1.4–7.0)
Neutrophils: 45 %
Platelets: 180 10*3/uL (ref 150–450)
RBC: 3.09 x10E6/uL — ABNORMAL LOW (ref 4.14–5.80)
RDW: 15.4 % (ref 12.3–15.4)
Retic Ct Pct: 0.7 % (ref 0.6–2.6)
Total Iron Binding Capacity: 305 ug/dL (ref 250–450)
UIBC: 236 ug/dL (ref 111–343)
Vitamin B-12: 872 pg/mL (ref 232–1245)
WBC: 4.7 10*3/uL (ref 3.4–10.8)

## 2017-11-05 LAB — CMP14+EGFR
ALT: 13 IU/L (ref 0–44)
AST: 18 IU/L (ref 0–40)
Albumin/Globulin Ratio: 1.3 (ref 1.2–2.2)
Albumin: 3.5 g/dL (ref 3.5–4.7)
Alkaline Phosphatase: 74 IU/L (ref 39–117)
BUN/Creatinine Ratio: 12 (ref 10–24)
BUN: 28 mg/dL — ABNORMAL HIGH (ref 8–27)
Bilirubin Total: 0.2 mg/dL (ref 0.0–1.2)
CO2: 29 mmol/L (ref 20–29)
Calcium: 9.4 mg/dL (ref 8.6–10.2)
Chloride: 99 mmol/L (ref 96–106)
Creatinine, Ser: 2.42 mg/dL — ABNORMAL HIGH (ref 0.76–1.27)
GFR calc Af Amer: 27 mL/min/{1.73_m2} — ABNORMAL LOW (ref >59)
GFR calc non Af Amer: 23 mL/min/{1.73_m2} — ABNORMAL LOW (ref >59)
Globulin, Total: 2.6 g/dL (ref 1.5–4.5)
Glucose: 95 mg/dL (ref 65–99)
Potassium: 4.7 mmol/L (ref 3.5–5.2)
Sodium: 143 mmol/L (ref 134–144)
Total Protein: 6.1 g/dL (ref 6.0–8.5)

## 2017-11-05 NOTE — Progress Notes (Signed)
Patient had follow up appointment with [name] on 11/04/17 and is aware of medications.

## 2017-11-08 ENCOUNTER — Ambulatory Visit (INDEPENDENT_AMBULATORY_CARE_PROVIDER_SITE_OTHER): Payer: Medicare HMO

## 2017-11-08 ENCOUNTER — Ambulatory Visit (INDEPENDENT_AMBULATORY_CARE_PROVIDER_SITE_OTHER): Payer: Medicare HMO | Admitting: Family Medicine

## 2017-11-08 ENCOUNTER — Other Ambulatory Visit: Payer: Self-pay | Admitting: Family Medicine

## 2017-11-08 VITALS — BP 139/68 | HR 46 | Temp 97.0°F | Ht 67.0 in | Wt 128.0 lb

## 2017-11-08 DIAGNOSIS — M51369 Other intervertebral disc degeneration, lumbar region without mention of lumbar back pain or lower extremity pain: Secondary | ICD-10-CM

## 2017-11-08 DIAGNOSIS — M79604 Pain in right leg: Secondary | ICD-10-CM | POA: Diagnosis not present

## 2017-11-08 DIAGNOSIS — M5136 Other intervertebral disc degeneration, lumbar region: Secondary | ICD-10-CM | POA: Diagnosis not present

## 2017-11-08 DIAGNOSIS — M79605 Pain in left leg: Secondary | ICD-10-CM | POA: Diagnosis not present

## 2017-11-08 DIAGNOSIS — R531 Weakness: Secondary | ICD-10-CM | POA: Diagnosis not present

## 2017-11-08 MED ORDER — PREDNISONE 10 MG (21) PO TBPK: ORAL_TABLET | ORAL | 0 refills | Status: DC

## 2017-11-08 NOTE — Patient Instructions (Addendum)
STOP the Meloxicam.  DO NOT take ibuprofen, aleve, naproxen, motrin, advil.  These all can worsen your kidney function.  You had xrays done today.  I will call you with the results once the radiologist has read them.  If your symptoms get worse, you get numb, fall or have weakness; you are not able to pee or you have stool incontinence, go to the emergency department.  I have sent you in Prednisone to take for the next 6 days to help with pain.  Follow up with Ronnald Collum in 1-2 weeks for recheck.

## 2017-11-08 NOTE — Progress Notes (Signed)
Subjective: CC: Leg pain PCP: [name], [name] Steven [name] is a 84 y.o. male presenting to clinic today for:  1. Leg pain Patient reports onset of bilateral lower extremity pain several weeks ago.  He notes that he has no pain with seated position but when he goes to stand or walk he gets radiation of bilateral leg pain.  He is prescribed meloxicam for pain which he thinks he takes but is not sure.  He denies any lower extremity numbness or tingling.  He is not overtly weak but states that his pain interferes with his ability to ambulate.  Denies any preceding trauma.  Denies back pain or hip pains.  ROS: Per HPI  No Known Allergies Past Medical History:  Diagnosis Date  . Anxiety   . Cancer [facility]) 2013   Lung - cancer free x3 years.   . Hyperlipidemia   . Hypertension   . Myocardial infarction ([place]) 1999    Current Outpatient Medications:  .  albuterol (PROVENTIL) (2.5 MG/3ML) 0.083% nebulizer solution, Take 3 mLs (2.5 mg total) by nebulization every 2 (two) hours as needed for wheezing or shortness of breath., Disp: 75 mL, Rfl: 12 .  aspirin 81 MG tablet, Take 81 mg by mouth daily. , Disp: , Rfl:  .  calcium carbonate (TUMS - DOSED IN MG ELEMENTAL CALCIUM) 500 MG chewable tablet, Chew 1 tablet by mouth daily., Disp: , Rfl:  .  diltiazem (CARDIZEM CD) 120 MG 24 hr capsule, Take 1 capsule (120 mg total) by mouth daily., Disp: 90 capsule, Rfl: 0 .  doxazosin (CARDURA) 2 MG tablet, Take 1 tablet (2 mg total) by mouth daily., Disp: 90 tablet, Rfl: 1 .  furosemide (LASIX) 20 MG tablet, Take 1 tablet (20 mg total) by mouth every other day., Disp: 45 tablet, Rfl: 1 .  ipratropium-albuterol (DUONEB) 0.5-2.5 (3) MG/3ML SOLN, Take 3 mLs by nebulization 3 (three) times daily., Disp: 360 mL, Rfl:  .  iron polysaccharides (NIFEREX) 150 MG capsule, Take 1 capsule (150 mg total) by mouth daily., Disp: , Rfl:  .  meloxicam (MOBIC) 15 MG tablet, TAKE 1 TABLET BY MOUTH EVERY DAY,  Disp: 90 tablet, Rfl: 0 Social History   Socioeconomic History  . Marital status: Divorced    Spouse name: Not on file  . Number of children: Not on file  . Years of education: Not on file  . Highest education level: Not on file  Occupational History  . Not on file  Social Needs  . Financial resource strain: Not on file  . Food insecurity:    Worry: Not on file    Inability: Not on file  . Transportation needs:    Medical: Not on file    Non-medical: Not on file  Tobacco Use  . Smoking status: Former Smoker    Packs/day: 2.00    Years: 73.00    Pack years: 146.00    Types: Cigarettes    Start date: 02/05/2006    Last attempt to quit: 11/05/2006    Years since quitting: 11.0  . Smokeless tobacco: Never Used  Substance and Sexual Activity  . Alcohol use: No  . Drug use: No  . Sexual activity: Not on file  Lifestyle  . Physical activity:    Days per week: Not on file    Minutes per session: Not on file  . Stress: Not on file  Relationships  . Social connections:    Talks on phone: Not on file  Gets together: Not on file    Attends religious service: Not on file    Active member of club or organization: Not on file    Attends meetings of clubs or organizations: Not on file    Relationship status: Not on file  . Intimate partner violence:    Fear of current or ex partner: Not on file    Emotionally abused: Not on file    Physically abused: Not on file    Forced sexual activity: Not on file  Other Topics Concern  . Not on file  Social History Narrative  . Not on file   Family History  Problem Relation Age of Onset  . Cancer Mother   . Pulmonary embolism Father     Objective: Office vital signs reviewed. BP 139/68 (BP Location: Left Arm)   Pulse (!) 46   Temp (!) 97 F (36.1 C) (Oral)   Ht 5\' 7"  (1.702 m)   Wt 128 lb (58.1 kg)   BMI 20.05 kg/m   Physical Examination:  General: Awake, alert, frail appearing elderly male. No acute distress MSK: exam  limited. Patient arrives in wheelchair.  Lumbar spine: Active range of motion fairly preserved.  He has no midline tenderness to palpation.  No paraspinal muscle tenderness to palpation.  Negative straight leg raise bilaterally. Neuro: 5/5 hip/ LE Strength and light touch sensation grossly intact  Dg Lumbar Spine 2-3 Views  Result Date: 11/08/2017 CLINICAL DATA:  Low back pain, bilateral leg weakness EXAM: LUMBAR SPINE - 2-3 VIEW COMPARISON:  None. FINDINGS: Diffuse degenerative disc and facet disease. Mild osteopenia. No fracture or malalignment. SI joints are symmetric and unremarkable. Diffuse aortic and iliac atherosclerosis. No visible aneurysm. Prior cholecystectomy. IMPRESSION: Degenerative disc and facet disease. Osteopenia. No acute bony abnormality. Aortic atherosclerosis. Electronically Signed   By: Rolm Baptise M.D.   On: 11/08/2017 10:20   Dg Pelvis 1-2 Views  Result Date: 11/08/2017 CLINICAL DATA:  Bilateral leg pain and weakness EXAM: PELVIS - 1-2 VIEW COMPARISON:  None. FINDINGS: Generalized osteopenia. No acute fracture or dislocation. No aggressive osseous lesion. Peripheral vascular atherosclerotic disease. IMPRESSION: No acute osseous injury of the pelvis. Electronically Signed   By: Kathreen Devoid   On: 11/08/2017 10:21    Assessment/ Plan: 84 y.o. male   1. Bilateral leg pain Difficult to discern the etiology of his leg pain at this time.  I question spinal stenosis given worsening with standing positions.  X-rays were obtained which revealed diffuse degenerative changes within the lumbar spine.  He has what appears to be sacralization of L5 but this was not commented on the read.  I recommended he completely discontinue the meloxicam, particularly in the setting of worsening renal function.  I prescribed him prednisone taper.  Reasons for return and emergent evaluation in the emergency department discussed.  Patient was good understanding.  AVS was reviewed and provided to the  patient today.  He will follow-up in 1 week for recheck with his PCP.  If symptoms persist, would consider referral to orthopedics for underlying degenerative changes in back.  Differential diagnosis considered include peripheral artery disease, given that symptoms seem to be worse with ambulation.  Could consider ABIs. - DG Lumbar Spine 2-3 Views; Future - DG Pelvis 1-2 Views; Future  2. Degenerative disc disease, lumbar As above.    Orders Placed This Encounter  Procedures  . DG Lumbar Spine 2-3 Views    Standing Status:   Future    Number of  Occurrences:   1    Standing Expiration Date:   01/08/2019    Order Specific Question:   Reason for Exam (SYMPTOM  OR DIAGNOSIS REQUIRED)    Answer:   back pain    Order Specific Question:   Preferred imaging location?    Answer:   Internal  . DG Pelvis 1-2 Views    Standing Status:   Future    Number of Occurrences:   1    Standing Expiration Date:   01/08/2019    Order Specific Question:   Reason for Exam (SYMPTOM  OR DIAGNOSIS REQUIRED)    Answer:   back pain    Order Specific Question:   Preferred imaging location?    Answer:   Internal   Meds ordered this encounter  Medications  . predniSONE (STERAPRED UNI-PAK 21 TAB) 10 MG (21) TBPK tablet    Sig: As directed x 6 days    Dispense:  21 tablet    Refill:  0   Medications Discontinued During This Encounter  Medication Reason  . meloxicam (MOBIC) 15 MG tablet      Blia Totman Windell Moulding, DO [place] [phone]

## 2017-11-22 ENCOUNTER — Other Ambulatory Visit: Payer: Self-pay | Admitting: Nurse Practitioner

## 2017-11-22 DIAGNOSIS — M25561 Pain in right knee: Secondary | ICD-10-CM

## 2017-11-22 DIAGNOSIS — M25562 Pain in left knee: Principal | ICD-10-CM

## 2017-12-03 ENCOUNTER — Other Ambulatory Visit: Payer: Self-pay | Admitting: Nurse Practitioner

## 2017-12-03 DIAGNOSIS — F411 Generalized anxiety disorder: Secondary | ICD-10-CM

## 2017-12-03 NOTE — Telephone Encounter (Signed)
Last seen 11/08/17  MMM

## 2017-12-04 ENCOUNTER — Ambulatory Visit: Payer: Medicare HMO | Admitting: Cardiology

## 2017-12-13 ENCOUNTER — Other Ambulatory Visit: Payer: Self-pay | Admitting: *Deleted

## 2017-12-13 ENCOUNTER — Encounter: Payer: Self-pay | Admitting: *Deleted

## 2017-12-13 ENCOUNTER — Ambulatory Visit (INDEPENDENT_AMBULATORY_CARE_PROVIDER_SITE_OTHER): Payer: Medicare HMO | Admitting: *Deleted

## 2017-12-13 VITALS — BP 143/71 | HR 93 | Ht 64.5 in | Wt 122.0 lb

## 2017-12-13 DIAGNOSIS — Z0000 Encounter for general adult medical examination without abnormal findings: Secondary | ICD-10-CM

## 2017-12-13 MED ORDER — POLYSACCHARIDE IRON COMPLEX 150 MG PO CAPS: 150.0000 mg | ORAL_CAPSULE | Freq: Every day | ORAL | 0 refills | Status: DC

## 2017-12-13 NOTE — Patient Outreach (Signed)
[place] [facility]) Care Management  12/13/2017  Steven [name] 1928/10/24 [id number]   Telephone Screen  Referral Date: 12/13/17 Referral Source: MD referral from [place]  Reason for consult Assess and provide resources as needed. Likely has dementia. Will have him f/u with PCP. Really needs help with medication management.   Diagnoses of Kidney Failure  Heart Failure  Note Text:  You may need to call the patient's son. I have a hard time getting Steven [name] on the phone and then he has a hard time remembering why we called. His son's name is Steven [name] and his number is [phone]  Insurance: Steven [name] HMO [facility] x 2 ED visits x 1 in the last 6 months  Syncope went to [facility], he can not recall the date of this visit  Outreach attempt # 1 successful to his home number Patient is able to verify HIPAA Reviewed and addressed referral to [facility] with patient  Social: Steven [name] is divorced and confirms he lives alone and is independent with all his care He reports he has a truck and still drives independently. He reports having a daughter and son. He reports being able to call his son for short term, occasion assistance ("if I call him" )versus his daughter. He reports having 12 other siblings but all except him are deceased. His ex wife is still living but he does not have contact with her He informs CM he prefers to stay at home mostly and watch tv  He reports having issues with anxiety all my life"   Conditions: HTN, Coronary atherosclerosis, CHF, COPD, squamous cell carcinoma of lund, PNA, Dementia, BPH, CKD, Hyperlipidemia anxiety, erectile dysfunction, abdominal bruit, DNR, sundowning, chronic anemia   Medications: He denies concerns with taking medications as prescribed, affording medications, side effects of medications and questions about medications CM re reviewed the content of his MD referral with him He confirms prefers  not to take the flu -last one taken was on 11/12/13 "they were making people sick" Cm discussed the importance of taking one and referred him to his MD  Some time medicine make him feel "crazy" was his response when he was asked if he takes his medications as ordered. He denies he is forgetting his medicine He reports he puts his pills on his kitchen table to remind him to take them.   He states he takes meds for HTN and anxiety He is able to inform cm of his klonopin 1 mg prn "I don't take it three times a day"  He is able to find bottles of atorvastatin and calcium on his kitchen table  He reports all other bottles of medicines were  empty and he threw them away "I was not going to take any more"  He informed CM he has medicine at drug store and he has not picked them up He states he will go tomorrow 12/14/17 to pick up these medicines.  CM discussed pill packaging and counseled him on not missing medications. He mentions he does not believe he needs pill packaging He tells CM he generally keeps "five bottles" of pills on his kitchen table. After CM called out his medicine names he reports not having Cardizem nor Cardura He confirms his son called him a "few days ago" because the son got a call from the pharmacy to encourage Steven [name] to pick up his medicines  DME grab bars in bathroom denies cane, w/c or walker   Appointments:  Seen by MD RN for annual wellness visit on 12/13/17 Return to see primary MD in January 2020  See Cardilogist, Steven [name] in December 2019   Advance Directives: Denies need for assist with advance directives    Consent: [facility] RN CM reviewed Steven [name] services with patient. Patient gave verbal consent for services [name].  Plan: [name] RN CM will refer Steven [name] to [place] for medication adherence and management  [facility] RN CM sent a successful outreach letter as discussed with [facility] brochure enclosed for review RN CM provided patient with [facility] 24 hr Nurse Line contact [phone].    -[facility] RN CM left a voice message for his son, Steven [name]. Steven Hamman, RN, BSN, [place] Coordinator Office number [phone] Mobile number [phone]  Main [facility] number [phone] Fax number [phone]

## 2017-12-13 NOTE — Patient Outreach (Signed)
[facility]) Care Management  12/13/2017  Elisa Sorlie Cloer Nov 18, 1928 [id number]   Care coordination  Mr Lotito's son, [name] left a voice message CM returned the call [name] states there is a concern with one of the medicines for pick up at Mr Crum's pharmacy  [facility] RN CM called [facility] in [place] and spoke with Lamar Blinks who confirms Mr Sanchez stopped by the pharmacy and picked up his medicines. She reports that the MD ordered an OTC multivitamin that is not paid for by [facility]. Mr Staubs paid for the medication today 12/13/17 [facility] RN CM called back to update [name]  Plans Mr Malenfant has been referred to [place]. Lavina Hamman, RN, BSN, [place] Coordinator Office number [phone] Mobile number [phone]  Main [facility] number [phone] Fax number [phone]

## 2017-12-13 NOTE — Progress Notes (Addendum)
Subjective:   Steven [name] is a 84 y.o. male who presents for a Medicare Annual Wellness Visit. [name] lives in an apartment alone. He has neighbors that he socializes with and he walks to the mailbox daily. He has one adult son and one adult daughter and an adult grandson that he raised as his own. He has contact with all of them but it sounds inconsistent. Stated that he hears from his daughter "when she needs money." Sometimes he pays her to come over and vacuum. His son calls him a few times a week to check in. He is available by phone to speak to his healthcare team. I actually talked with him yesterday to let him know that I moved Mr Asch's appointment up. He did communicate this with Mr Ring. His grandson is local but he doesn't see him often. They don't visit frequently. Maybe once every week or two. He has a list of medications but only brought in clonazepam and atorvastatin. He says that is all he is taking right now and that he threw away the others because he hadn't taken them in a while and was doing ok. [facility] 90 day supply of cardizem and cardura around 11/04/17. He is also anemic and has been prescribed iron but that script did not transmit electronically to the pharmacy. He is not currently taking anything for anemia.   Review of Systems    Patient reports that his overall health is unchanged compared to last year.  Cardiac Risk Factors include: advanced age (>69men, >82 women);dyslipidemia;hypertension;male gender;sedentary lifestyle;smoking/ tobacco exposure. Scheduled with Dr Bronson Ing in [place] on 01/06/18.  Musculoskeletal: bilateral lower extremity pain after walking. No pain at rest. Question claudication especially since he has a history of CAD and MI. He saw Dr Lajuana Ripple and had a workup. Was to f/u with PCP but has not as of yet.   Neuro: Denies any problems with memory of cognition but some deficits were apparent during the  visit.   Respiratory: COPD. Has an albuterol inhaler. Does not use maintenance as prescribed nor does he use the nebulizer.   All other systems negative       Current Medications (verified) Outpatient Encounter Medications as of 12/13/2017  Medication Sig  . albuterol (PROVENTIL) (2.5 MG/3ML) 0.083% nebulizer solution Take 3 mLs (2.5 mg total) by nebulization every 2 (two) hours as needed for wheezing or shortness of breath.  [name] aspirin 81 MG tablet Take 81 mg by mouth daily.   [name] atorvastatin (LIPITOR) 10 MG tablet Take 10 mg by mouth daily.  . calcium carbonate (TUMS - DOSED IN MG ELEMENTAL CALCIUM) 500 MG chewable tablet Chew 1 tablet by mouth daily.  . clonazePAM (KLONOPIN) 1 MG tablet TAKE 1 TABLET (1 MG TOTAL) BY MOUTH 3 (THREE) TIMES DAILY AS NEEDED.  [name] diltiazem (CARDIZEM CD) 120 MG 24 hr capsule Take 1 capsule (120 mg total) by mouth daily. (Patient not taking: Reported on 12/13/2017)  . doxazosin (CARDURA) 2 MG tablet Take 1 tablet (2 mg total) by mouth daily. (Patient not taking: Reported on 12/13/2017)  . furosemide (LASIX) 20 MG tablet Take 1 tablet (20 mg total) by mouth every other day.  . ipratropium-albuterol (DUONEB) 0.5-2.5 (3) MG/3ML SOLN Take 3 mLs by nebulization 3 (three) times daily.  . iron polysaccharides (NIFEREX) 150 MG capsule Take 1 capsule (150 mg total) by mouth daily.  . [DISCONTINUED] iron polysaccharides (NIFEREX) 150 MG capsule  Take 1 capsule (150 mg total) by mouth daily. (Patient not taking: Reported on 12/13/2017)  . [DISCONTINUED] meloxicam (MOBIC) 15 MG tablet TAKE 1 TABLET BY MOUTH EVERY DAY (Patient not taking: Reported on 12/13/2017)  . [DISCONTINUED] predniSONE (STERAPRED UNI-PAK 21 TAB) 10 MG (21) TBPK tablet As directed x 6 days   No facility-administered encounter medications on file as of 12/13/2017.     Allergies (verified) Patient has no known allergies.   History: Past Medical History:  Diagnosis Date  . Anxiety   . Cancer [facility]) 2013    Lung - cancer free x3 years.   . Hyperlipidemia   . Hypertension   . Myocardial infarction ([place]) 1999   Past Surgical History:  Procedure Laterality Date  . CARPAL TUNNEL RELEASE  1980's   right and left  . CHOLECYSTECTOMY    . COLONOSCOPY N/A 09/29/2014   Procedure: COLONOSCOPY;  Surgeon: Rogene Houston, MD;  [facility];  Service: Endoscopy;  Laterality: N/A;  . CORONARY ARTERY BYPASS GRAFT  1999   5 grafts  . FLEXIBLE BRONCHOSCOPY  11/07/2011   Procedure: FLEXIBLE BRONCHOSCOPY;  Surgeon: Gaye Pollack, MD;  Location: [facility];  Service: Thoracic;  Laterality: N/A;  . HERNIA REPAIR  6789   umbilical hernia repair  . JOINT REPLACEMENT  2000   right shoulder rotator cuff repair   Family History  Problem Relation Age of Onset  . Cancer Mother        pancreatic  . Pulmonary embolism Father    Social History   Socioeconomic History  . Marital status: Divorced    Spouse name: Not on file  . Number of children: 2  . Years of education: 6  . Highest education level: 6th grade  Occupational History  . Occupation: Retired    Comment: Copy  . Financial resource strain: Not hard at all  . Food insecurity:    Worry: Never true    Inability: Never true  . Transportation needs:    Medical: No    Non-medical: No  Tobacco Use  . Smoking status: Former Smoker    Packs/day: 2.00    Years: 73.00    Pack years: 146.00    Types: Cigarettes    Start date: 02/05/2006    Last attempt to quit: 11/05/2006    Years since quitting: 11.1  . Smokeless tobacco: Never Used  Substance and Sexual Activity  . Alcohol use: No  . Drug use: No  . Sexual activity: Not Currently  Lifestyle  . Physical activity:    Days per week: 0 days    Minutes per session: 0 min  . Stress: To some extent  Relationships  . Social connections:    Talks on phone: More than three times a week    Gets together: Never    Attends religious service: Never    Active member of club or  organization: No    Attends meetings of clubs or organizations: Never    Relationship status: Divorced  Other Topics Concern  . Not on file  Social History Narrative  . Not on file    Tobacco Use Former smoker  Clinical Intake:  Pre-visit preparation completed: No  Pain : No/denies pain. Revised: bilateral lower extremity pain with activity. He is unable to walk for long distances. Pain usually starts about 10 minutes into walking.      Nutritional Status: BMI of 19-24  Normal(Meals-typically eats 3 times a day. Eats a lot  of sandwiches or will get a biscuit at [facility]. Drinks RC cola water. Sips on water during. ) Nutritional Risks: Other (Comment)(lives alone and has some cognitive deficits) Diabetes: No  How often do you need to have someone help you when you read instructions, pamphlets, or other written materials from your doctor or pharmacy?: 3 - Sometimes What is the last grade level you completed in school?: 6th  Interpreter Needed?: No  Information entered by :: Chong Sicilian, RN  Activities of Daily Living In your present state of health, do you have any difficulty performing the following activities: 12/13/2017 08/28/2017  Hearing? N Y  Comment states that he doesn't have any trouble but documentation in chart says that he has difficulty on the phone -  Vision? N -  Comment declines eye exams -  Difficulty concentrating or making decisions? N Y  Walking or climbing stairs? N Y  Comment has some pain in lower legs after walking for a long distance -  Dressing or bathing? N Y  Doing errands, shopping? N Y  Conservation officer, nature and eating ? N -  Using the Toilet? N -  In the past six months, have you accidently leaked urine? N -  Do you have problems with loss of bowel control? N -  Managing your Medications? N -  Comment states that he manages medications but he only brought in clonazepam and lipitor. He isn't sure if he is taking cardizen and cardura. -  Managing  your Finances? N -  Housekeeping or managing your Housekeeping? Y -  Comment he pays his daughter to come over and help some -  Some recent data might be hidden     Exercise Current Exercise Habits: Home exercise routine(walks around apartment complex daily), Type of exercise: walking, Time (Minutes): 15, Frequency (Times/Week): 7, Weekly Exercise (Minutes/Week): 105, Intensity: Mild, Exercise limited by: Other - see comments;orthopedic condition(s);respiratory conditions(s)(advanced age)    Depression Screen PHQ 2/9 Scores 12/13/2017 11/08/2017 11/04/2017 09/24/2017  PHQ - 2 Score 0 0 0 0     Fall Risk Fall Risk  12/13/2017 12/13/2017 12/13/2017 11/08/2017 11/04/2017  Falls in the past year? 0 (No Data) 0 No No  Comment - Has an elevated in his apartment. He has a tub in his bathroom with handrails - - -  Number falls in past yr: - - - - -  Injury with Fall? - - - - -  Risk for fall due to : - - Medication side effect - -  Risk for fall due to: Comment - - clonazepam - -  Follow up - - Falls prevention discussed - -     Safety Is the patient's home free of loose throw rugs in walkways, pet beds, electrical cords, etc?   yes      Grab bars in the bathroom? yes      Walkin shower? no      Shower Seat? no      Handrails on the stairs?   yes. Uses elevator in apartment building      Adequate lighting?   yes  Patient Care Team: [name], [name] as PCP - General (Family Medicine) Gaye Pollack, MD (Cardiothoracic Surgery) Tyler Pita, MD (Radiation Oncology) Herminio Commons, MD as Attending Physician (Cardiology)  Hospitalizations, surgeries, and ER visits in previous 12 months Multiple ED to admissions during the summer. Mainly for COPD complications, pneumonia. 2 ED visits without admission in May 2019.   Objective:    Today's Vitals  12/13/17 0819  BP: (!) 143/71  Pulse: 93  Weight: 122 lb (55.3 kg)  Height: 5' 4.5" (1.638 m)   Body mass index is  20.62 kg/m.  Advanced Directives 12/13/2017 08/27/2017 08/07/2017 08/22/2016 03/15/2016 09/28/2014 09/28/2014  Does Patient Have a Medical Advance Directive? No No No No No No No  Would patient like information on creating a medical advance directive? No - Patient declined No - Patient declined No - Patient declined Yes (MAU/Ambulatory/Procedural Areas - Information given) No - Patient declined No - patient declined information No - patient declined information  Pre-existing out of facility DNR order (yellow form or pink MOST form) - - - - - - -    Hearing/Vision  No hearing or vision deficits noted during visit.   Cognitive Function: MMSE - Mini Mental State Exam 08/22/2016  Orientation to time 5  Orientation to Place 5  Registration 3  Attention/ Calculation 5  Recall 0  Language- name 2 objects 2  Language- repeat 1  Language- follow 3 step command 2  Language- read & follow direction 1  Write a sentence 1  Copy design 1  Total score 26     6CIT Screen 12/13/2017  What Year? 4 points  What month? 0 points  What time? 0 points  Count back from 20 4 points  Months in reverse 4 points  Repeat phrase 0 points  Total Score 12   Normal Cognitive Function Screening: Yes    Immunizations and Health Maintenance Immunization History  Administered Date(s) Administered  . Influenza,inj,Quad PF,6+ Mos 11/24/2012, 11/12/2013  . Pneumococcal Conjugate-13 06/18/2014  . Pneumococcal Polysaccharide-23 09/06/2003   There are no preventive care reminders to display for this patient. Health Maintenance  Topic Date Due  . TETANUS/TDAP  12/14/2018 (Originally 04/23/1947)  . PNA vac Low Risk Adult  Completed  . INFLUENZA VACCINE  Discontinued        Assessment:   This is a routine wellness examination for [facility]      Plan:    Goals    . Exercise 3x per week (30 min per time)     Seated Strengthening Handout     . Have 3 meals a day     Eat 3 meals daily Increase fruits and  vegetables Increased lean protein        Health Maintenance Recommendations: Eye exam  Refuses flu shot  Additional Screening Recommendations: Lung: Low Dose CT Chest recommended if Age 55-80 years, 30 pack-year currently smoking OR have quit w/in 15years. Patient does qualify. Hepatitis C Screening recommended: no  Today's Orders Orders Placed This Encounter  Procedures  . AMB Referral to [place] [facility]    Referral Priority:   Routine    Referral Type:   Consultation    Referral Reason:   [facility]    Number of Visits Requested:   1    Keep f/u with [name], [name] and any other specialty appointments you may have Continue current medications. Start iron replacement that was sent to the pharmacy. Take with either vitamin C tab, citrus fruit, or orange juice. Avoid grapefruit though because of lipitor. Increase water and fiber intake to offset possible constipation. Stool softener like docusate sodium can be used if needed.  Move carefully to avoid falls. Use assistive devices like a cane or walker if needed. Aim for at least 150 minutes of moderate activity a week. This can be chair exercises if necessary. Reading or puzzles are a good way to  exercise your brain Stay connected with friends and family. Social connections are beneficial to your emotional and mental health.   I will send a staff message to [name], [name] about my concerns.   I have personally reviewed and noted the following in the patient's chart:   . Medical and social history . Use of alcohol, tobacco or illicit drugs  . Current medications and supplements . Functional ability and status . Nutritional status . Physical activity . Advanced directives . List of other physicians . Hospitalizations, surgeries, and ER visits in previous 12 months . Vitals . Screenings to include cognitive, depression, and falls . Referrals and appointments  In addition, I have reviewed  and discussed with patient certain preventive protocols, quality metrics, and best practice recommendations. A written personalized care plan for preventive services as well as general preventive health recommendations were provided to patient.     Chong Sicilian, RN   12/13/2017   I have reviewed and agree with the above AWV documentation.   Mary-Margaret Hassell Done, [name]

## 2017-12-13 NOTE — Patient Instructions (Signed)
  Mr. Steven [name] , Thank you for taking time to come for your Medicare Wellness Visit. I appreciate your ongoing commitment to your health goals. Please review the following plan we discussed and let me know if I can assist you in the future.   These are the goals we discussed: Goals    . Exercise 3x per week (30 min per time)     Seated Strengthening Handout     . Have 3 meals a day     Eat 3 meals daily Increase fruits and vegetables Increased lean protein       This is a list of the screening recommended for you and due dates:  Health Maintenance  Topic Date Due  . Tetanus Vaccine  12/14/2018*  . Pneumonia vaccines  Completed  . Flu Shot  Discontinued  *Topic was postponed. The date shown is not the original due date.    Pick up your iron prescription. Take this once a day with some type of fruit, juice or vitamin C tablet. Drink plenty of water and make sure to eat fiber (fruits, vegetables, grains). You can take a stool softener like docusate sodium if you have problems with constipation.

## 2017-12-18 ENCOUNTER — Other Ambulatory Visit: Payer: Self-pay

## 2017-12-18 NOTE — Patient Outreach (Signed)
[facility] [facility]) Care Management  12/18/2017  Steven [name] October 07, 1928 [id number]   84 year old male referred to [facility].  [place] services requested for medication management.  Patient with dementia and not taking medications as prescribed.   PMHx includes, but not limited to, hypertension, atherosclerosis, COPD with emphysema, h/o lung cancer, dementia, benign prostatic hyperplasia, CKD Stage 3, and hyperlipidemia.   Successful outreach to Steven [name] and his son Steven [name].  HIPAA identifiers verified.    Steven [name] reports that he is only taking clonopin and atorvastatin because he "doesn't need anything else."  Notes on Epic medication list show that he had several medications refilled with a 90 day supply on 11/04/17, but threw them away because he states he "hadn't taken them in awhile."   Patient states he is agreeable to trying pill packaging and we discussed that [facility].   Son is also concerned that his father is not taken his medications correctly.  He is also agreeable to establishing compliance pill packaging.  He selected [facility] in [place].  Patient's son is aware that he may have to pay for the refills since he received a 90 day supply on 11/04/17 that was discarded.    In-basket message sent to his PCP, requesting an updated medication list.  Once I receive the list and make any clarifications.  I will request that PCP calls in new prescriptions to [facility] in [place].  Plan: Await message from PCP, Rockne Coons, [name].   Joetta Manners, PharmD Clinical Pharmacist [place] [phone]

## 2017-12-19 ENCOUNTER — Other Ambulatory Visit: Payer: Self-pay

## 2017-12-19 ENCOUNTER — Telehealth: Payer: Self-pay | Admitting: Nurse Practitioner

## 2017-12-19 ENCOUNTER — Ambulatory Visit: Payer: Self-pay

## 2017-12-19 DIAGNOSIS — N400 Enlarged prostate without lower urinary tract symptoms: Secondary | ICD-10-CM

## 2017-12-19 DIAGNOSIS — N179 Acute kidney failure, unspecified: Secondary | ICD-10-CM | POA: Diagnosis not present

## 2017-12-19 DIAGNOSIS — I509 Heart failure, unspecified: Secondary | ICD-10-CM | POA: Diagnosis not present

## 2017-12-19 DIAGNOSIS — N183 Chronic kidney disease, stage 3 (moderate): Secondary | ICD-10-CM | POA: Diagnosis not present

## 2017-12-19 DIAGNOSIS — D649 Anemia, unspecified: Secondary | ICD-10-CM | POA: Diagnosis not present

## 2017-12-19 DIAGNOSIS — Z720 Tobacco use: Secondary | ICD-10-CM | POA: Diagnosis not present

## 2017-12-19 DIAGNOSIS — F411 Generalized anxiety disorder: Secondary | ICD-10-CM

## 2017-12-19 MED ORDER — ATORVASTATIN CALCIUM 10 MG PO TABS: 10.0000 mg | ORAL_TABLET | Freq: Every day | ORAL | 5 refills | Status: DC

## 2017-12-19 MED ORDER — FUROSEMIDE 20 MG PO TABS: 20.0000 mg | ORAL_TABLET | ORAL | 1 refills | Status: DC

## 2017-12-19 MED ORDER — POLYSACCHARIDE IRON COMPLEX 150 MG PO CAPS: 150.0000 mg | ORAL_CAPSULE | Freq: Every day | ORAL | 0 refills | Status: DC

## 2017-12-19 MED ORDER — DOXAZOSIN MESYLATE 2 MG PO TABS: 2.0000 mg | ORAL_TABLET | Freq: Every day | ORAL | 1 refills | Status: DC

## 2017-12-19 MED ORDER — CLONAZEPAM 1 MG PO TABS: 1.0000 mg | ORAL_TABLET | Freq: Three times a day (TID) | ORAL | 2 refills | Status: DC | PRN

## 2017-12-19 MED ORDER — DILTIAZEM HCL ER COATED BEADS 120 MG PO CP24: 120.0000 mg | ORAL_CAPSULE | Freq: Every day | ORAL | 0 refills | Status: DC

## 2017-12-19 NOTE — Telephone Encounter (Signed)
They need all of his chronic medication sent to [facility] in [place]. They are going to start pill packing them for him weekly. Patient was agreeable to try this and to take all his medications.

## 2017-12-19 NOTE — Patient Outreach (Signed)
[facility]) Care Management  12/19/2017  Jacarie Pate Billman Mar 07, 1928 [id number]  Successful call  Ronnald Collum, [name]'s nurse, Abigail Butts.  Abigail Butts states that she will call in new prescriptions to [facility] in [place], so we can initiate compliance pill packaging.   Successful outreach call to [facility].  Technician verifies that they received new prescriptions for Mr. Morden.  I requested that they call and transfer his albuterol neb and Duoneb prescriptions from [place], so all his prescription records will be at one pharmacy.   [facility] will [facility] on file, to avoid future confusion.  [facility] will fill 2 weeks of packs for Mr. Wnek.  He is having a test next week to evaluate his declining renal function, so we will see if his medications change before he gets more pill packs.  His son states that he will pick up his packs tomorrow afternoon and have [facility] Pharmacist counsel on how to use the pill packs.  Son is aware that prn medications will not be in the pill pack and I have asked him to remove all bottles, except his clonazepam from patient's house once pill packs arrive.  Son says he will work with his dad next week to make ensure he understands how to use the pill packs.    Plan:  Will follow up with family on Monday to see if they have questions.   Joetta Manners, PharmD Clinical Pharmacist [place] [phone]

## 2017-12-20 ENCOUNTER — Telehealth: Payer: Self-pay | Admitting: Nurse Practitioner

## 2017-12-20 NOTE — Telephone Encounter (Signed)
Pharmacy changed in computer

## 2017-12-23 ENCOUNTER — Ambulatory Visit: Payer: Self-pay

## 2017-12-23 ENCOUNTER — Other Ambulatory Visit: Payer: Self-pay

## 2017-12-23 NOTE — Patient Outreach (Signed)
[facility] [name]) Care Management  [place]  12/23/2017  Steven [name] August 20, 1928 [id number]  84 year old male referred to [place].  [name] services requested for medication management.  Patient with dementia and not taking medications as prescribed.   PMHx includes, but not limited to, hypertension, atherosclerosis, COPD with emphysema, h/o lung cancer, dementia, benign prostatic hyperplasia, CKD Stage 3, and hyperlipidemia.   Unsuccessful telephone call attempt # 1 to patient's son. Unable to leave message.  Plan:  I will make another outreach attempt to patient within 3-4 business days. to follow up receipt of compliance pill packaging.   Steven [name], PharmD Clinical Pharmacist [place] [phone]  Addendum: Incoming call received from Steven [name]'s son.  HIPAA identifiers verified.   Son verifies that he picked up his father's compliance pill packs from [facility] and his dad started the packs on Saturday.  He states he and his sister have been working with his dad to ensure that he is using the packs correctly.  He is having a little difficulty since he has dementia, but they are re-enforcing the concept.  Son states that Steven [name] is waiting to have a "kidney study" and should hear when the appointment is scheduled later this week.    Plan: Will follow up with son on Friday to see if he knows when Steven [name] test is and when he needs to order more pill packs. Will also see if his dad is using the pill packs correctly.  Steven [name], PharmD Clinical Pharmacist [place] [phone]

## 2017-12-26 ENCOUNTER — Ambulatory Visit: Payer: Self-pay

## 2017-12-26 ENCOUNTER — Other Ambulatory Visit: Payer: Self-pay | Admitting: Nephrology

## 2017-12-26 ENCOUNTER — Other Ambulatory Visit (HOSPITAL_COMMUNITY): Payer: Self-pay | Admitting: Nephrology

## 2017-12-26 ENCOUNTER — Other Ambulatory Visit: Payer: Self-pay

## 2017-12-26 DIAGNOSIS — R634 Abnormal weight loss: Secondary | ICD-10-CM

## 2017-12-26 DIAGNOSIS — R319 Hematuria, unspecified: Secondary | ICD-10-CM

## 2017-12-26 NOTE — Patient Outreach (Addendum)
[facility]) Care Management  12/26/2017  Zaidyn Claire Strozier 1928-10-10 [id number]  Incoming call received from Mr. Schwartzkopf's son, Arnav Cregg. Agostinelli.  HIPAA identifiers verified.   Mr. Paschal states that his dad is doing well with his pill packs.  He reports that he had not heard anything about scheduling his father's "kidney test" so he called their office and was told that they would call him next week to schedule the test.   [facility] had originally filled Mr. Serano pill packs to last until 11/29 awaiting results of his test to see if any of his medications would change.   I called [facility] and requested that they outreach to the son to arrange delivery of two more weeks of pill packs next week. The son is aware that the Pharmacy will be calling.  He is going to schedule delivery to his house due to his father's dementia.  Plan: Follow up with Mr. Lesniak (son) on 12/2.  Joetta Manners, PharmD Clinical Pharmacist [place] [phone]

## 2017-12-27 ENCOUNTER — Ambulatory Visit: Payer: Self-pay

## 2017-12-31 ENCOUNTER — Ambulatory Visit: Payer: Self-pay | Admitting: *Deleted

## 2017-12-31 DIAGNOSIS — F0391 Unspecified dementia with behavioral disturbance: Secondary | ICD-10-CM

## 2017-12-31 DIAGNOSIS — J439 Emphysema, unspecified: Secondary | ICD-10-CM

## 2017-12-31 DIAGNOSIS — I5033 Acute on chronic diastolic (congestive) heart failure: Secondary | ICD-10-CM

## 2017-12-31 DIAGNOSIS — F411 Generalized anxiety disorder: Secondary | ICD-10-CM

## 2017-12-31 DIAGNOSIS — I10 Essential (primary) hypertension: Secondary | ICD-10-CM

## 2017-12-31 NOTE — Patient Instructions (Addendum)
1. Patient provided with instruction about medication management change (pill bottles @ [facility] > Pill packs @[facility] )  2. Discussed plan for obtaining bp monitor and routine home/self monitoring 3. Discussed maintenance and use of nebulizer and will discuss further @ CMM face to face visit  Goals Addressed            This Visit's Progress   . "I don't know about this machine" (pt-stated)       Clinical Goal(s): Over the next 30 days, patient and/or son (primary caregiver) will verbalize understanding of plan of care for maintenance and use of nebulizer.  Interventions: discussion with patient re: use of nebulizer, maintenance schedule, and understanding of prescribing instructions     . "I want to make sure everything is okay about my medicine" (pt-stated)       Clinical Goal(s): Over the next 30 days, patient and/or son (primary caregiver) will verbalize understanding of medication management services offered included with CCM.   Interventions: explained medication management as part of CCM services and collaboration with [facility] team; provided instruction to patient regarding change from medications [facility] to pill packs via [facility]     . "I want to make sure I can keep control of my anxiety"  (pt-stated)       Clinical Goal(s): Over the next 30 days, patient and/or son (primary caregiver) will verbalize understanding of role of CCM services in assisting patient with chronic management of anxiety  Interventions: discussed role of CCM services in long term/chronic management of anxiety; discussed medication management r/t to anxiety     . "I want to take care of my blood pressure"  (pt-stated)       Clinical Goal(s): Over the next 45 days, patient and/or son will verbalize understanding of plan of care for self management of HTN   Interventions: discussion w/ patient re: resources available to him for self management of HTN via his health  plan and CCM services       Mr. Mallick was given information about Chronic Care Management services today including:  1. CCM service includes personalized support from designated clinical staff supervised by his physician, including individualized plan of care and coordination with other care providers 2. 24/7 contact phone numbers for assistance for urgent and routine care needs. 3. Service will only be billed when office clinical staff spend 20 minutes or more in a month to coordinate care. 4. Only one practitioner may furnish and bill the service in a calendar month. 5. The patient may stop CCM services at any time (effective at the end of the month) by phone call to the office staff. 6. The patient will be responsible for cost sharing (co-pay) of up to 20% of the service fee (after annual deductible is met).  Patient agreed to services and verbal consent obtained.  The patient verbalized understanding of instructions provided today and declined a print copy of patient instruction materials.

## 2017-12-31 NOTE — Chronic Care Management (AMB) (Signed)
Chronic Care Management   Initial Visit Note  12/31/2017 Name: Steven [name] MRN: [id number] DOB: [birth year]  Referred by: [name], [name] Reason for referral : Chronic Care Management (Initial CCM Outreach Call)   Subjective: "I think anything you think will help me is a good idea"  Objective:   BP Readings from Last 3 Encounters:  12/13/17 (!) 143/71  11/08/17 139/68  11/04/17 (!) 92/42   SpO2 Readings from Last 3 Encounters:  10/01/17 97%  08/30/17 96%  08/15/17 94%    Assessment: Steven [name] is an 84year old male primary care patient of [name] [name]. Steven [name] medical history includes HTN, Dementia, CHF, COPD, and anxiety. He was referred to the care management team by his health plan. I reached out to Steven [name] today re: care management services.   Goals Addressed            This Visit's Progress   . "I don't know about this machine" (pt-stated)        Knowledge Deficits related to DME use and management - Patient has limited knowledge of appropriate and prescribed use of nebulizer. With history of COPD and CAP and continued tobacco abuse, patient would benefit from evaluation of current DME, education re: use and chronic management.   Clinical Goal(s): Over the next 30 days, patient and/or son (primary caregiver) will verbalize understanding of plan of care for maintenance and use of nebulizer.  Interventions: discussion with patient re: use of nebulizer, maintenance schedule, and understanding of prescribing instructions     . "I want to make sure everything is okay about my medicine" (pt-stated)        Knowledge Deficits related to chronic medication management - Patient referred by AW[facility] nurse to [facility] team who is actively working with patient. Recent intervention by [facility]  - medications changed to pre-filled blister packs @ [facility]. Patient has knowledge deficits related to medication management.    Clinical Goal(s): Over the next 30 days, patient and/or son (primary caregiver) will verbalize understanding of medication management services offered included with CCM.   Interventions: explained medication management as part of CCM services and collaboration with [facility] team; provided instruction to patient regarding change from [facility] packs via [facility]     . "I want to make sure I can keep control of my anxiety"  (pt-stated)        Clinical Goal(s): Over the next 30 days, patient and/or son (primary caregiver) will verbalize understanding of role of CCM services in assisting patient with chronic management of anxiety  Interventions: discussed role of CCM services in long term/chronic management of anxiety; discussed medication management r/t to anxiety     . "I want to take care of my blood pressure"  (pt-stated)        Knowledge Deficits related to long term self health management of HTN - patient lacks knowledge of diagnosis and self health management activities; is not monitoring bp at home; taking bp medications from pill pack over the last month  Clinical Goal(s): Over the next 45 days, patient and/or son will verbalize understanding of plan of care for self management of HTN   Interventions: discussion w/ patient re: resources available to him for self management of HTN via his health plan and CCM services; advised patient to bring health plan catalogue to CCM visit (BP monitor)       Plan:  Telephone outreach to patient's son (verbal permission given by patient) over the next 7 days. Telephone follow up with patient over next 14 days. Face to face visit scheduled for January.    Steven [name] was given information about Chronic Care Management services today including:  1. CCM service includes personalized support from designated clinical staff supervised by his physician, including individualized plan of care  and coordination with other care providers 2. 24/7 contact phone numbers for assistance for urgent and routine care needs. 3. Service will only be billed when office clinical staff spend 20 minutes or more in a month to coordinate care. 4. Only one practitioner may furnish and bill the service in a calendar month. 5. The patient may stop CCM services at any time (effective at the end of the month) by phone call to the office staff. 6. The patient will be responsible for cost sharing (co-pay) of up to 20% of the service fee (after annual deductible is met).  Patient agreed to services and verbal consent obtained.  [place]  [phone]

## 2018-01-03 ENCOUNTER — Ambulatory Visit (HOSPITAL_COMMUNITY)
Admission: RE | Admit: 2018-01-03 | Discharge: 2018-01-03 | Disposition: A | Discharge status: Home / Self Care | Payer: Medicare HMO | Source: Ambulatory Visit | Attending: Nephrology | Admitting: Nephrology

## 2018-01-03 DIAGNOSIS — R634 Abnormal weight loss: Secondary | ICD-10-CM

## 2018-01-03 DIAGNOSIS — R319 Hematuria, unspecified: Secondary | ICD-10-CM | POA: Diagnosis not present

## 2018-01-03 DIAGNOSIS — K5730 Diverticulosis of large intestine without perforation or abscess without bleeding: Secondary | ICD-10-CM | POA: Diagnosis not present

## 2018-01-06 ENCOUNTER — Ambulatory Visit: Payer: Self-pay

## 2018-01-07 ENCOUNTER — Encounter: Payer: Self-pay | Admitting: Cardiovascular Disease

## 2018-01-07 ENCOUNTER — Other Ambulatory Visit (HOSPITAL_COMMUNITY)
Admission: RE | Admit: 2018-01-07 | Discharge: 2018-01-07 | Disposition: A | Discharge status: Home / Self Care | Payer: Medicare HMO | Source: Ambulatory Visit | Attending: Nephrology | Admitting: Nephrology

## 2018-01-07 ENCOUNTER — Ambulatory Visit (INDEPENDENT_AMBULATORY_CARE_PROVIDER_SITE_OTHER): Payer: Medicare HMO | Admitting: Cardiovascular Disease

## 2018-01-07 VITALS — BP 131/55 | HR 90 | Ht 67.0 in | Wt 126.0 lb

## 2018-01-07 DIAGNOSIS — I10 Essential (primary) hypertension: Secondary | ICD-10-CM | POA: Insufficient documentation

## 2018-01-07 DIAGNOSIS — Z1159 Encounter for screening for other viral diseases: Secondary | ICD-10-CM | POA: Diagnosis not present

## 2018-01-07 DIAGNOSIS — N183 Chronic kidney disease, stage 3 unspecified: Secondary | ICD-10-CM

## 2018-01-07 DIAGNOSIS — I771 Stricture of artery: Secondary | ICD-10-CM

## 2018-01-07 DIAGNOSIS — R809 Proteinuria, unspecified: Secondary | ICD-10-CM | POA: Insufficient documentation

## 2018-01-07 DIAGNOSIS — I25708 Atherosclerosis of coronary artery bypass graft(s), unspecified, with other forms of angina pectoris: Secondary | ICD-10-CM

## 2018-01-07 DIAGNOSIS — I5032 Chronic diastolic (congestive) heart failure: Secondary | ICD-10-CM | POA: Diagnosis not present

## 2018-01-07 DIAGNOSIS — Z79899 Other long term (current) drug therapy: Secondary | ICD-10-CM | POA: Diagnosis not present

## 2018-01-07 DIAGNOSIS — E559 Vitamin D deficiency, unspecified: Secondary | ICD-10-CM | POA: Insufficient documentation

## 2018-01-07 DIAGNOSIS — D519 Vitamin B12 deficiency anemia, unspecified: Secondary | ICD-10-CM | POA: Diagnosis not present

## 2018-01-07 DIAGNOSIS — I6521 Occlusion and stenosis of right carotid artery: Secondary | ICD-10-CM | POA: Diagnosis not present

## 2018-01-07 LAB — IRON AND TIBC
Iron: 41 ug/dL — ABNORMAL LOW (ref 45–182)
Saturation Ratios: 10 % — ABNORMAL LOW (ref 17.9–39.5)
TIBC: 428 ug/dL (ref 250–450)
UIBC: 387 ug/dL

## 2018-01-07 LAB — HEMOGLOBIN AND HEMATOCRIT, BLOOD
HCT: 34.3 % — ABNORMAL LOW (ref 39.0–52.0)
Hemoglobin: 10.1 g/dL — ABNORMAL LOW (ref 13.0–17.0)

## 2018-01-07 LAB — CREATININE CLEARANCE, URINE, 24 HOUR
Collection Interval-CRCL: 24 hours
Creatinine Clearance: 32 mL/min — ABNORMAL LOW (ref 75–125)
Creatinine, 24H Ur: 704 mg/d — ABNORMAL LOW (ref 800–2000)
Creatinine, Urine: 69.06 mg/dL
Urine Total Volume-CRCL: 1020 mL

## 2018-01-07 LAB — RENAL FUNCTION PANEL
Albumin: 3.8 g/dL (ref 3.5–5.0)
Anion gap: 8 (ref 5–15)
BUN: 18 mg/dL (ref 8–23)
CO2: 31 mmol/L (ref 22–32)
Calcium: 9.3 mg/dL (ref 8.9–10.3)
Chloride: 101 mmol/L (ref 98–111)
Creatinine, Ser: 1.52 mg/dL — ABNORMAL HIGH (ref 0.61–1.24)
GFR calc Af Amer: 47 mL/min — ABNORMAL LOW (ref >60)
GFR calc non Af Amer: 41 mL/min — ABNORMAL LOW (ref >60)
Glucose, Bld: 90 mg/dL (ref 70–99)
Phosphorus: 3.2 mg/dL (ref 2.5–4.6)
Potassium: 4 mmol/L (ref 3.5–5.1)
Sodium: 140 mmol/L (ref 135–145)

## 2018-01-07 LAB — FERRITIN: Ferritin: 21 ng/mL — ABNORMAL LOW (ref 24–336)

## 2018-01-07 NOTE — Progress Notes (Signed)
SUBJECTIVE: The patient presents for routine follow-up.  He has a history of coronary artery disease with history of CABG, chronic diastolic heart failure, and COPD.  The patient denies any symptoms of chest pain, palpitations, shortness of breath, lightheadedness, dizziness, leg swelling, orthopnea, PND, and syncope.  He is here with his son, [name].  The patient continues to live independently in an apartment in [place]. He cooks eggs and toast for himself and meets his buddies at [facility] every morning for coffee and a biscuit.    Review of Systems: As per "subjective", otherwise negative.  No Known Allergies  Current Outpatient Medications  Medication Sig Dispense Refill  . albuterol (PROVENTIL) (2.5 MG/3ML) 0.083% nebulizer solution Take 3 mLs (2.5 mg total) by nebulization every 2 (two) hours as needed for wheezing or shortness of breath. 75 mL 12  . aspirin 81 MG tablet Take 81 mg by mouth daily.     [name] atorvastatin (LIPITOR) 10 MG tablet Take 1 tablet (10 mg total) by mouth daily. 30 tablet 5  . clonazePAM (KLONOPIN) 1 MG tablet Take 1 tablet (1 mg total) by mouth 3 (three) times daily as needed. 90 tablet 2  . diltiazem (CARDIZEM CD) 120 MG 24 hr capsule Take 1 capsule (120 mg total) by mouth daily. 90 capsule 0  . doxazosin (CARDURA) 2 MG tablet Take 1 tablet (2 mg total) by mouth daily. 90 tablet 1  . furosemide (LASIX) 20 MG tablet Take 1 tablet (20 mg total) by mouth every other day. 45 tablet 1  . ipratropium-albuterol (DUONEB) 0.5-2.5 (3) MG/3ML SOLN Take 3 mLs by nebulization 3 (three) times daily. 360 mL   . iron polysaccharides (NIFEREX) 150 MG capsule Take 1 capsule (150 mg total) by mouth daily. 90 capsule 0   No current facility-administered medications for this visit.     Past Medical History:  Diagnosis Date  . Anxiety   . Cancer [facility]) 2013   Lung - cancer free x3 years.   . Hyperlipidemia   . Hypertension   . Myocardial infarction ([facility]) 1999     Past Surgical History:  Procedure Laterality Date  . CARPAL TUNNEL RELEASE  1980's   right and left  . CHOLECYSTECTOMY    . COLONOSCOPY N/A 09/29/2014   Procedure: COLONOSCOPY;  Surgeon: Rogene Houston, MD;  Location[facility];  Service: Endoscopy;  Laterality: N/A;  . CORONARY ARTERY BYPASS GRAFT  1999   5 grafts  . FLEXIBLE BRONCHOSCOPY  11/07/2011   Procedure: FLEXIBLE BRONCHOSCOPY;  Surgeon: Gaye Pollack, MD;  Location: [facility];  Service: Thoracic;  Laterality: N/A;  . HERNIA REPAIR  6861   umbilical hernia repair  . JOINT REPLACEMENT  2000   right shoulder rotator cuff repair    Social History   Socioeconomic History  . Marital status: Divorced    Spouse name: Not on file  . Number of children: 2  . Years of education: 6  . Highest education level: 6th grade  Occupational History  . Occupation: Retired    Comment: Copy  . Financial resource strain: Not hard at all  . Food insecurity:    Worry: Never true    Inability: Never true  . Transportation needs:    Medical: No    Non-medical: No  Tobacco Use  . Smoking status: Former Smoker    Packs/day: 2.00    Years: 73.00    Pack years: 146.00    Types: Cigarettes  Start date: 02/05/2006    Last attempt to quit: 11/05/2006    Years since quitting: 11.1  . Smokeless tobacco: Current User  . Tobacco comment: uses camel patches  Substance and Sexual Activity  . Alcohol use: No  . Drug use: No  . Sexual activity: Not Currently  Lifestyle  . Physical activity:    Days per week: 0 days    Minutes per session: 0 min  . Stress: To some extent  Relationships  . Social connections:    Talks on phone: More than three times a week    Gets together: Never    Attends religious service: Never    Active member of club or organization: No    Attends meetings of clubs or organizations: Never    Relationship status: Divorced  . Intimate partner violence:    Fear of current or ex partner: No     Emotionally abused: No    Physically abused: No    Forced sexual activity: No  Other Topics Concern  . Not on file  Social History Narrative  . Not on file     Vitals:   01/07/18 0930  BP: (!) 131/55  Pulse: 90  SpO2: 94%  Weight: 126 lb (57.2 kg)  Height: 5\' 7"  (1.702 m)    Wt Readings from Last 3 Encounters:  01/07/18 126 lb (57.2 kg)  12/13/17 122 lb (55.3 kg)  11/08/17 128 lb (58.1 kg)     PHYSICAL EXAM General: NAD HEENT: Normal. Neck: No JVD, no thyromegaly. Lungs: Clear to auscultation bilaterally with normal respiratory effort. CV: Regular rate and rhythm, normal S1/S2, no O2/H4, 2/6 systolic murmur over RUSB. No pretibial or periankle edema. Loud right carotid bruit and mild left carotid bruit.      Abdomen: Soft, nontender, no distention.  Neurologic: Alert and oriented.  Psych: Normal affect. Skin: Normal. Musculoskeletal: No gross deformities.    ECG: Reviewed above under Subjective   Labs:    Lipids: Lab Results  Component Value Date/Time   LDLCALC 75 11/04/2017 08:53 AM   LDLCALC 56 08/27/2013 10:03 AM   LDLCALC 58 08/22/2012 11:35 AM   CHOL 149 11/04/2017 08:53 AM   CHOL 124 08/22/2012 11:35 AM   TRIG 106 11/04/2017 08:53 AM   TRIG 88 03/17/2014 09:05 AM   TRIG 135 08/22/2012 11:35 AM   HDL 53 11/04/2017 08:53 AM   HDL 46 03/17/2014 09:05 AM   HDL 39 (L) 08/22/2012 11:35 AM       ASSESSMENT AND PLAN:  1.  Chronic diastolic heart failure: Symptomatically stable.  Euvolemic on Lasix 20 mg every other day.  Blood pressure is normal.  No changes to therapy.  2.  Coronary artyer disease: History of 5 vessel CABG in 1989.  Left ventricular systolic function is normal by most recent echocardiogram.  Continue aspirin and atorvastatin 10 mg.  No beta-blockers due to history of bradycardia.    To medically stable.  3.  Hypertension: Controlled on present therapy which includes long-acting diltiazem 120 mg daily and Cardura 2 mg daily.  No  changes.  4.  Chronic kidney disease stage III: Most recent labs reviewed above.  Stable.  5.  Right carotid bruit/RICA stenosis: Doppler studies demonstrated 40 to 59% right internal carotid artery stenosis.  There was 1 to 39% left internal carotid artery stenosis.  There was also right subclavian artery stenosis.  Denies right arm weakness and pain.  I will monitor.    Disposition: Follow up 6  months   Kate Sable, M.D., F.A.C.C.

## 2018-01-07 NOTE — Patient Instructions (Signed)
Your physician wants you to follow-up in: [place] will receive a reminder letter in the mail two months in advance. If you don't receive a letter, please call our office to schedule the follow-up appointment.  Your physician recommends that you continue on your current medications as directed. Please refer to the Current Medication list given to you today.  Thank you for choosing [facility]!!

## 2018-01-08 LAB — HEPATITIS B SURFACE ANTIBODY, QUANTITATIVE: Hep B S AB Quant (Post): <3.1 m[IU]/mL — ABNORMAL LOW (ref >9.9)

## 2018-01-08 LAB — PROTEIN ELECTROPHORESIS, SERUM
A/G Ratio: 1.2 (ref 0.7–1.7)
Albumin ELP: 3.6 g/dL (ref 2.9–4.4)
Alpha-1-Globulin: 0.3 g/dL (ref 0.0–0.4)
Alpha-2-Globulin: 0.7 g/dL (ref 0.4–1.0)
Beta Globulin: 1 g/dL (ref 0.7–1.3)
Gamma Globulin: 1.2 g/dL (ref 0.4–1.8)
Globulin, Total: 3.1 g/dL (ref 2.2–3.9)
Total Protein ELP: 6.7 g/dL (ref 6.0–8.5)

## 2018-01-08 LAB — MPO/PR-3 (ANCA) ANTIBODIES
ANCA Proteinase 3: <3.5 U/mL (ref 0.0–3.5)
Myeloperoxidase Abs: <9 U/mL (ref 0.0–9.0)

## 2018-01-08 LAB — UPEP/UIFE/LIGHT CHAINS/TP, 24-HR UR
% BETA, Urine: 0 %
ALPHA 1 URINE: 0 %
Albumin, U: 100 %
Alpha 2, Urine: 0 %
Free Kappa Lt Chains,Ur: 35.2 mg/L — ABNORMAL HIGH (ref 1.35–24.19)
Free Kappa/Lambda Ratio: 7.43 (ref 2.04–10.37)
Free Lambda Lt Chains,Ur: 4.74 mg/L (ref 0.24–6.66)
GAMMA GLOBULIN URINE: 0 %
Total Protein, Urine-Ur/day: 110 mg/24 hr (ref 30–150)
Total Protein, Urine: 10.8 mg/dL
Total Volume: 1020

## 2018-01-08 LAB — C3 COMPLEMENT: C3 Complement: 98 mg/dL (ref 82–167)

## 2018-01-08 LAB — C4 COMPLEMENT: Complement C4, Body Fluid: 19 mg/dL (ref 14–44)

## 2018-01-08 LAB — ANTI-DNA ANTIBODY, DOUBLE-STRANDED: ds DNA Ab: <1 IU/mL (ref 0–9)

## 2018-01-08 LAB — ANTI-JO 1 ANTIBODY, IGG: Anti JO-1: <0.2 AI (ref 0.0–0.9)

## 2018-01-08 LAB — ANA: Anti Nuclear Antibody(ANA): POSITIVE — AB

## 2018-01-08 LAB — PARATHYROID HORMONE, INTACT (NO CA): PTH: 34 pg/mL (ref 15–65)

## 2018-01-08 LAB — VITAMIN D 25 HYDROXY (VIT D DEFICIENCY, FRACTURES): Vit D, 25-Hydroxy: 27.8 ng/mL — ABNORMAL LOW (ref 30.0–100.0)

## 2018-01-08 LAB — HEPATITIS C ANTIBODY: HCV Ab: <0.1 s/co ratio (ref 0.0–0.9)

## 2018-01-09 ENCOUNTER — Telehealth: Payer: Self-pay

## 2018-01-09 LAB — MISC LABCORP TEST (SEND OUT): Labcorp test code: 162045

## 2018-01-09 LAB — UPEP/UIFE/LIGHT CHAINS/TP, 24-HR UR: Total Volume: 1020

## 2018-01-09 LAB — COMPLEMENT, TOTAL: Compl, Total (CH50): >60 U/mL (ref 42–999999)

## 2018-01-10 ENCOUNTER — Ambulatory Visit: Payer: Self-pay

## 2018-01-10 ENCOUNTER — Telehealth: Payer: Self-pay

## 2018-01-10 ENCOUNTER — Other Ambulatory Visit: Payer: Self-pay

## 2018-01-10 LAB — IMMUNOFIXATION ELECTROPHORESIS
IgA: 287 mg/dL (ref 61–437)
IgG (Immunoglobin G), Serum: 1358 mg/dL (ref 700–1600)
IgM (Immunoglobulin M), Srm: 155 mg/dL — ABNORMAL HIGH (ref 15–143)
Total Protein ELP: 6.8 g/dL (ref 6.0–8.5)

## 2018-01-10 NOTE — Patient Outreach (Signed)
[facility]) Care Management  01/10/2018  Quinterrius Errington Sassi [birth year] [id number]  Successful outreach to Mr. Winebarger's son and HIPAA identifiers.  Son states that his dad will see his nephrologist Thursday 12/12 at 2pm.  He states that he has one more pill pack to last through next Saturday.  He states he is going to call the Pharmacy today and decide if they want to send one more pill pack out or wait until next Friday to pack and send it to see if the MD makes any medication changes.  He reports that his dad is using the pill packs correctly and states that he likes them.   Plan: Follow up with Mr. Wilner next Friday after nephrology visit.  Joetta Manners, PharmD Clinical Pharmacist [place] [phone]

## 2018-01-16 DIAGNOSIS — D509 Iron deficiency anemia, unspecified: Secondary | ICD-10-CM | POA: Diagnosis not present

## 2018-01-16 DIAGNOSIS — E559 Vitamin D deficiency, unspecified: Secondary | ICD-10-CM | POA: Diagnosis not present

## 2018-01-16 DIAGNOSIS — N183 Chronic kidney disease, stage 3 (moderate): Secondary | ICD-10-CM | POA: Diagnosis not present

## 2018-01-17 ENCOUNTER — Ambulatory Visit: Payer: Self-pay

## 2018-01-17 ENCOUNTER — Other Ambulatory Visit: Payer: Self-pay

## 2018-01-17 NOTE — Patient Outreach (Signed)
[facility]) Care Management  [place] 01/17/2018  Steven [name] 1929/01/25 [id number]  Successful outreach call to Steven [name]'s son, Steven [name]. Turk.  HIPAA identifiers verified.  Patient's son states that his dad saw a nephrologist and that his kidney function has improved.  He states that his dad is going to to receive two IV iron infusions and that his medications were unchanged.   He reports that his dad will follow up with the nephorologist in April.  Son reports that he spoke with Steven [name] at [facility] yesterday and ordered a month's supply of compliance pill packs for his father that will be delivered today.    Steven [name] states that he has no further medication questions or concerns.  Informed him that I will close his [place] case at this time.  He has my phone number and is aware that he can call me in the future should medication issues arise.  Plan: Route case closure letter to, Steven [name], [place].  Steven [name], PharmD Clinical Pharmacist [place] [phone]

## 2018-01-23 ENCOUNTER — Telehealth: Payer: Self-pay

## 2018-01-23 ENCOUNTER — Encounter (HOSPITAL_COMMUNITY)
Admission: RE | Admit: 2018-01-23 | Discharge: 2018-01-23 | Disposition: A | Discharge status: Home / Self Care | Payer: Medicare HMO | Source: Ambulatory Visit | Attending: Nephrology | Admitting: Nephrology

## 2018-01-23 DIAGNOSIS — D509 Iron deficiency anemia, unspecified: Secondary | ICD-10-CM | POA: Insufficient documentation

## 2018-01-23 MED ORDER — SODIUM CHLORIDE 0.9 % IV SOLN
510.0000 mg | Freq: Once | INTRAVENOUS | Status: AC
  Administered 2018-01-23: 510 mg via INTRAVENOUS
  Filled 2018-01-23: qty 17

## 2018-01-23 MED ORDER — SODIUM CHLORIDE 0.9 % IV SOLN
INTRAVENOUS | Status: DC
  Administered 2018-01-23: 11:00:00 via INTRAVENOUS

## 2018-01-31 ENCOUNTER — Encounter (HOSPITAL_COMMUNITY): Admission: RE | Admit: 2018-01-31 | Payer: Medicare HMO | Source: Ambulatory Visit

## 2018-02-04 ENCOUNTER — Encounter (HOSPITAL_COMMUNITY)
Admission: RE | Admit: 2018-02-04 | Discharge: 2018-02-04 | Disposition: A | Discharge status: Home / Self Care | Payer: Medicare HMO | Source: Ambulatory Visit | Attending: Nephrology | Admitting: Nephrology

## 2018-02-04 DIAGNOSIS — D509 Iron deficiency anemia, unspecified: Secondary | ICD-10-CM | POA: Diagnosis not present

## 2018-02-04 MED ORDER — SODIUM CHLORIDE 0.9 % IV SOLN
510.0000 mg | Freq: Once | INTRAVENOUS | Status: AC
  Administered 2018-02-04: 510 mg via INTRAVENOUS
  Filled 2018-02-04: qty 17

## 2018-02-04 MED ORDER — SODIUM CHLORIDE 0.9 % IV SOLN
Freq: Once | INTRAVENOUS | Status: AC
  Administered 2018-02-04: 12:00:00 via INTRAVENOUS

## 2018-02-07 ENCOUNTER — Ambulatory Visit (INDEPENDENT_AMBULATORY_CARE_PROVIDER_SITE_OTHER): Payer: Medicare HMO | Admitting: *Deleted

## 2018-02-07 VITALS — BP 117/67 | HR 87 | Ht 67.0 in | Wt 126.0 lb

## 2018-02-07 DIAGNOSIS — F05 Delirium due to known physiological condition: Secondary | ICD-10-CM | POA: Diagnosis not present

## 2018-02-07 DIAGNOSIS — J439 Emphysema, unspecified: Secondary | ICD-10-CM

## 2018-02-07 DIAGNOSIS — I5032 Chronic diastolic (congestive) heart failure: Secondary | ICD-10-CM | POA: Diagnosis not present

## 2018-02-07 DIAGNOSIS — F0391 Unspecified dementia with behavioral disturbance: Secondary | ICD-10-CM

## 2018-02-07 DIAGNOSIS — I10 Essential (primary) hypertension: Secondary | ICD-10-CM | POA: Diagnosis not present

## 2018-02-07 DIAGNOSIS — F411 Generalized anxiety disorder: Secondary | ICD-10-CM

## 2018-02-07 DIAGNOSIS — F0390 Unspecified dementia without behavioral disturbance: Secondary | ICD-10-CM | POA: Diagnosis not present

## 2018-02-07 DIAGNOSIS — N183 Chronic kidney disease, stage 3 (moderate): Secondary | ICD-10-CM | POA: Diagnosis not present

## 2018-02-07 NOTE — Chronic Care Management (AMB) (Addendum)
Chronic Care Management   Initial Visit Note  02/07/2018 Name: Steven [name] MRN: [id number] DOB: [birth year]  Referred by: [name], [name] Reason for referral : Chronic Care Management (Initial CCM visit)   Subjective: Patient mentioned throughout visit that he has struggled with anxiety for many years and had to stop working because of it. He also stated that he feels "depressed" often when he is home alone. Patient states that anxiety/depression is his most concerning health condition.    Objective:  Wt Readings from Last 3 Encounters:  02/07/18 126 lb (57.2 kg)  01/23/18 126 lb (57.2 kg)  01/07/18 126 lb (57.2 kg)   BP Readings from Last 3 Encounters:  02/07/18 117/67  02/04/18 (!) 122/51  01/23/18 (!) 153/67   Depression screen PHQ 2/9 02/07/2018 12/13/2017 12/13/2017  Decreased Interest 0 0 0  Down, Depressed, Hopeless 1 0 0  PHQ - 2 Score 1 0 0   Nutrition assessment performed. See hyperlink to assessment.    Assessment: Steven [name] is an 85year old male primary care patient of [name] [name]. Steven [name] includes HTN, Dementia, CHF, COPD, and anxiety. He has had 4 ED visits and 2 hospital admissions in 2019 that were related to respiratory illness/distress. He was referred to the care management team by his health plan.   Review of patient status, including review of consultants reports, relevant laboratory and other test results, and collaboration with appropriate care team members and the patient's provider was performed as part of comprehensive patient evaluation and provision of chronic care management services.    Goals Addressed    . COMPLETED: "I don't know about this machine" (pt-stated)       Clinical Goal(s): Over the next 30 days, patient and/or son (primary caregiver) will verbalize understanding of plan of care for maintenance and use of nebulizer.  Interventions: discussion with patient re: use of nebulizer, maintenance  schedule, and understanding of prescribing instructions, in person/face-to-face    . "I want to make sure everything is okay about my medicine" (pt-stated)       Clinical Goal(s): Over the next 30 days, patient and/or son (primary caregiver) will verbalize understanding of medication management services offered included with CCM.   Interventions: explained medication management as part of CCM services and collaboration with [facility] team; Follow up with community pharmacy and PCP regarding clonazepam dosing via pill pack.     [name] "I want to make sure I can keep control of my anxiety"  (pt-stated)       Clinical Goal(s): Over the next 30 days, patient and/or son (primary caregiver) will verbalize understanding of role of CCM services in assisting patient with chronic management of anxiety  Interventions: discussed role of CCM services in long term/chronic management of anxiety; discussed medication management r/t to anxiety Reviewed patient's current symptoms. Recommended stress management measures. Recommended community resources Geophysicist/field seismologist center) to address patient's reported depression related to social isolation. Referral to LCSW for psychosocial support.  Patient reports that clonazepam is currently not being included in his prefilled blister packs from the pharmacy. Patient did not notify pharmacy or practice regarding this omission. Taking from old pill bottle that he had on hand.  Clonazepam was in previous blister pack and he has remaining refills. RN Case Manager to address with provider. Education provided to patient and son regarding proactive medication management as part of plan of care.     [name] "I want to make sure  that my weight is ok" (pt-stated)       Patient's nutrition assessment = 10 which puts him in the high risk category. His BMI is 19.7. Discussed with patient and son importance of weekly weight monitoring and reporting for weight loss or change in appetite.    Nurse Case Manager Clinical Goal(s): over the next 90 days patient/son will monitor weight and record, notifying case manager and/or PCP of weight loss greater than 2 lbs.   Interventions:  1. Weighed patient in office for baseline 2. Provided health management notebook for ongoing documentation of weight 3. Nutrition assessment performed 4. Nutrition education provided 5. Provided information about community center meal resource 6. Advised to weigh weekly  Patient Self Care Activities:  1. Patient able to prepare light meals independently 2. Meets friends for breakfast and coffee daily    . "I want to take care of my blood pressure"  (pt-stated)       Clinical Goal(s): Over the next 45 days, patient and/or son will verbalize understanding of plan of care for self management of HTN   Interventions: discussion w/ patient re: resources available to him for self management of HTN via his health plan and CCM services. Blood pressure checked in office today with discussion with patient and son about results and importance of recording blood pressure measurements in his health management notebook.      [name] "Sometimes I feel depressed when I'm by myself" (pt-stated)       Patient's son, [name], reports that the family is engaged and talks with [name] frequently by phone. He himself takes him to any out of town appointments and sees him at least weekly. Other family members also visit and bring meals. He manages his medications and appointments for him and brings weekly meds on Saturday. However, the patient's perception is that he is isolated.   Nurse Case Manager Clinical Goal(s): Over the next 30 days patient will verbalize understanding of plan of care to address chronic anxiety/depression.   Interventions:  7. Referral to LCSW for psychosocial support 8. Recommended measures to address social isolation-[facility], utilizing apartment common area to socialize. Continue to  meet family/friends for breakfast and participate in weekly Bingo. 9. Advised to continue daily walking routine to support general health and incidental socialization  Patient Self Care Activities:  3. Participates in morning breakfast meeting with friends daily 4. Talks with family members by phone throughout the week 5. Goes to [employer] once a week       Plan:  Collaborate with community pharmacy and PCP in addressing pharmacy needs  Referral and collaboration with LCSW  I will follow up with patient and/or son over the next two weeks   Mr. Zimbelman was given information about Chronic Care Management services today including:  1. CCM service includes personalized support from designated clinical staff supervised by his physician, including individualized plan of care and coordination with other care providers 2. 24/7 contact phone numbers for assistance for urgent and routine care needs. 3. Service will only be billed when office clinical staff spend 20 minutes or more in a month to coordinate care. 4. Only one practitioner may furnish and bill the service in a calendar month. 5. The patient may stop CCM services at any time (effective at the end of the month) by phone call to the office staff. 6. The patient will be responsible for cost sharing (co-pay) of up to 20% of the service fee (after annual deductible is  met).  Patient agreed to services and verbal consent obtained.  Chong Sicilian, RN-BC, BSN Nurse Case Manager [facility] Ph: [phone]

## 2018-02-07 NOTE — Patient Instructions (Signed)
Visit Information  Goals we discussed today:  Goals Addressed    . COMPLETED: "I don't know about this machine" (pt-stated)       Clinical Goal(s): Over the next 30 days, patient and/or son (primary caregiver) will verbalize understanding of plan of care for maintenance and use of nebulizer.  Interventions: discussion with patient re: use of nebulizer, maintenance schedule, and understanding of prescribing instructions, in person/face-to-face     . "I want to make sure everything is okay about my medicine" (pt-stated)       Clinical Goal(s): Over the next 30 days, patient and/or son (primary caregiver) will verbalize understanding of medication management services offered included with CCM.   Interventions: explained medication management as part of CCM services and collaboration with [place] team; Follow up with community pharmacy and PCP regarding clonazepam dosing via pill pack.     [name] "I want to make sure I can keep control of my anxiety"  (pt-stated)       Clinical Goal(s): Over the next 30 days, patient and/or son (primary caregiver) will verbalize understanding of role of CCM services in assisting patient with chronic management of anxiety  Interventions: discussed role of CCM services in long term/chronic management of anxiety; discussed medication management r/t to anxiety Reviewed patient's current symptoms. Recommended stress management measures. Recommended community resources [facility]ic[facility] center) to address patient's reported depression related to social isolation. Referral to LCSW for psychosocial support.  Patient reports that clonazepam is currently not being included in his prefilled blister packs from the pharmacy. Patient did not notify pharmacy or practice regarding this omission. Taking from old pill bottle that he had on hand.  RN Case Manager to address with provider. Education provided to patient and son regarding proactive medication management as part of  plan of care.     [name] "I want to make sure that my weight is ok" (pt-stated)       Nurse Case Manager Clinical Goal(s): over the next 90 days patient/son will monitor weight and record, notifying case manager and/or PCP of weight loss greater than 2 lbs.   Interventions:  1. Weighed patient in office for baseline 2. Provided health management notebook for ongoing documentation of weight 3. Nutrition assessment performed 4. Nutrition education provided 5. Provided information about community center meal resource 6. Advised to weigh weekly  Patient Self Care Activities:  1. Patient able to prepare light meals independently 2. Meets friends for breakfast and coffee daily    . "I want to take care of my blood pressure"  (pt-stated)       Clinical Goal(s): Over the next 45 days, patient and/or son will verbalize understanding of plan of care for self management of HTN   Interventions: discussion w/ patient re: resources available to him for self management of HTN via his health plan and CCM services. Blood pressure checked in office today with discussion with patient and son about results and importance of recording blood pressure measurements in his health management notebook.      [name] "Sometimes I feel depressed when I'm by myself" (pt-stated)       Nurse Case Manager Clinical Goal(s): Over the next 30 days patient will verbalize understanding of plan of care to address chronic anxiety/depression.   Interventions:  7. Referral to LCSW for psychosocial support 8. Recommended measures to address social isolation-[facility], utilizing apartment common area to socialize. Continue to meet family/friends for breakfast and participate in weekly Bingo. 9. Advised  to continue daily walking routine to support general health and incidental socialization  Patient Self Care Activities:  3. Participates in morning breakfast meeting with friends daily 4. Talks with family members by phone  throughout the week 5. Goes to [employer] once a week       Social worker Provided:  . Review the senior center calendar for activities and socialization opportunities . Use your blue notebook to keep up with your weight, blood pressure, and appointments . Remember to check your feet and ankles for swelling and call us at the office if you have swelling or any other new symptoms . Don't forget to turn on your nebulizer routinely to make sure it works and wash your mouthpiece after use with warm soapy water and let it air dry . Continue your daily walks, continue eating 3 meals a day, call us at the office if you notice poor appetite or more than 2 lb weight loss in a week   Mr. Trainer was given information about Chronic Care Management services today including:  1. CCM service includes personalized support from designated clinical staff supervised by his physician, including individualized plan of care and coordination with other care providers 2. 24/7 contact phone numbers for assistance for urgent and routine care needs. 3. Service will only be billed when office clinical staff spend 20 minutes or more in a month to coordinate care. 4. Only one practitioner may furnish and bill the service in a calendar month. 5. The patient may stop CCM services at any time (effective at the end of the month) by phone call to the office staff. 6. The patient will be responsible for cost sharing (co-pay) of up to 20% of the service fee (after annual deductible is met).  Patient agreed to services and verbal consent obtained.   The patient verbalized understanding of instructions provided today and declined a print copy of patient instruction materials.    Chong Sicilian, RN-BC, BSN Nurse Case Manager [facility] Ph: [phone]

## 2018-02-10 ENCOUNTER — Ambulatory Visit: Payer: Self-pay | Admitting: Licensed Clinical Social Worker

## 2018-02-10 ENCOUNTER — Other Ambulatory Visit: Payer: Self-pay | Admitting: Pediatrics

## 2018-02-10 ENCOUNTER — Ambulatory Visit: Payer: Self-pay | Admitting: *Deleted

## 2018-02-10 DIAGNOSIS — F05 Delirium due to known physiological condition: Secondary | ICD-10-CM

## 2018-02-10 DIAGNOSIS — F411 Generalized anxiety disorder: Secondary | ICD-10-CM

## 2018-02-10 DIAGNOSIS — F0391 Unspecified dementia with behavioral disturbance: Secondary | ICD-10-CM

## 2018-02-10 DIAGNOSIS — F0390 Unspecified dementia without behavioral disturbance: Secondary | ICD-10-CM | POA: Diagnosis not present

## 2018-02-10 DIAGNOSIS — I10 Essential (primary) hypertension: Secondary | ICD-10-CM | POA: Diagnosis not present

## 2018-02-10 DIAGNOSIS — Z7189 Other specified counseling: Secondary | ICD-10-CM

## 2018-02-10 DIAGNOSIS — N183 Chronic kidney disease, stage 3 (moderate): Secondary | ICD-10-CM | POA: Diagnosis not present

## 2018-02-10 DIAGNOSIS — I5032 Chronic diastolic (congestive) heart failure: Secondary | ICD-10-CM | POA: Diagnosis not present

## 2018-02-10 DIAGNOSIS — J439 Emphysema, unspecified: Secondary | ICD-10-CM | POA: Diagnosis not present

## 2018-02-10 NOTE — Chronic Care Management (AMB) (Addendum)
  Chronic Care Management    Clinical Social Work INITIAL Geriatric Psychosocial Screening Note  02/10/2018 Name: Steven [name] MRN: [id number] DOB: [birth year]  Referred by: Nurse Case Manager, Chong Sicilian,  for assessment of psychosocial needs and support. I spoke via phone with patient today.   Patient and/or legal guardian verbally consented to meet with LCSW about presenting concerns.  OBJECTIVE:  Review of patient status, including review of consultants reports, relevant test results, and collaboration with appropriate care team members and the patient's provider was performed as part of comprehensive patient evaluation and provision of chronic care management services.    PHQ2/9:    Chronic Care Management from 02/10/2018 in [facility] and [facility]  PHQ-9 Total Score  7     Hospitalizations and/or ED presentations for mental health conditions:  0  Family/Social Information Housing Arrangement: Lives alone in apartment       Family members/support persons in your life? Daughter lives one mile away; son lives 5 miles away    Transportation to appointments provided by son of client.   Financial concerns: None mentioned  Food Security: np problem   Medication Concerns: patient requested RNCM follow up regarding Klonipin  Services Currently in place:  None  Patient enjoys attending bingo, watching TV westerns, attending breakfast with friends, Time with family members.  Patient reported stressors: anxiety, he has difficulty being alone at home  Caregiving Needs Primary Caregiver? Son  Caregiver availability:  Stable  Caregiver Stress Assessment: None reported per collaboration with RN case manager  Health Status of Caregiver:  good  Physical Limitations of Caregiver:  None  Advance Directives: Living Will or Medical POA? NO  Durable POA? Is it on file in the medical record  NO    Goals Addressed            This Visit's Progress   . "I want to make  sure I can keep control of my anxiety"  (pt-stated)       Clinical Social Work Clinical Goal(s): Over the next 30 days client and family caregiver will verbalize understanding of long term plan for management of anxiety with panic attacks.    Interventions: . Telephone interview with patient regarding anxiety, panic attack issues . Geriatric psychosocial assessment performed.  . Review with client of strategies to manage symptoms faced.   Patient Self Care Activities: ex.Arranges transportation to appointments (independent), utilizes self calming strategies (in progress) . Participates in morning breakfast gathering with friends. . Attends Tuesday night bingo at [employer] . Socializes with family. Sees son each Saturday.  . Talks openly about his struggles with anxiety and medications taken for anxiety.      Plan:   Recommendations:    Patient will benefit from continued LCSW CCM support either in person at provider office or by phone  LCSW to discuss [place] completion with client and son.  LCSW to contact client/son via phone in 3 weeks.    Patient/caregiver will:  Contact LCSW as needed  I agree with the chronic care management done here. Mary-Margaret Hassell Done, [name]   [name].Daphine Loch MSW, LCSW Licensed Clinical Social Worker [facility] Care Management [phone]

## 2018-02-10 NOTE — Patient Instructions (Signed)
Licensed Clinical Social Worker Visit Information  Goals we discussed today: Goals Addressed            This Visit's Progress   . "I want to make sure I can keep control of my anxiety"  (pt-stated)       Clinical Social Work Clinical Goal(s): Over the next 30 days client and family caregiver will verbalize understanding of long term plan for management of anxiety with panic attacks.    Interventions: . Telephone interview with patient regarding anxiety, panic attack issues . Geriatric psychosocial assessment performed.  . Review with client of strategies to manage symptoms faced.   Patient Self Care Activities: ex.Arranges transportation to appointments (independent), utilizes self calming strategies (in progress) . Participates in morning breakfast gathering with friends. . Attends Tuesday night bingo at [employer] . Socializes with family. Sees son each Saturday.  . Talks openly about his struggles with anxiety and medications taken for anxiety.      Patient Instructions:   Call the social worker for psychosocial support if needed  Use socialization activities to manage anxiety/depression (continue going to morning breakfast group, Bingo on Tuesday nights)  The patient verbalized understanding of instructions provided today and declined a print copy of patient instruction materials.   Steven [name].Naftoli Penny MSW, LCSW Licensed Clinical Social Worker [facility] Care Management [phone]

## 2018-02-11 NOTE — Chronic Care Management (AMB) (Addendum)
  Chronic Care Management   Note  02/11/2018 Name: ANTWONE CAPOZZOLI MRN: [id number] DOB: Apr 16, 1928  Mr Sliwa is receiving his medication in pill packs from [facility]. They are delivered to his son, [name], monthly and [name] takes them to Mr Carsey one week at a time. Last month the clonazepam was in the packages three times a day. This month they were not included. I contacted [facility] and they said that since they were PRN they did not package them. He had 60 remaining tablets which the pharmacist said that she could fill for [facility] to pickup.   Advised that I will consult with PCP about changing the directions to TID instead of TID PRN since he has been taking them TID, everyday for over 20 years.[name] notified that there are 60 tablets available for pickup and that we will send over a new script that will instruct them to package the clonazepam TID when he comes in for his appt with PCP on 02/14/18.   Chong Sicilian, RN-BC, BSN Nurse Case Manager [facility] Ph: [phone]  I agree with care delivered- Mary-Margaret Hassell Done, [name]

## 2018-02-14 ENCOUNTER — Ambulatory Visit (INDEPENDENT_AMBULATORY_CARE_PROVIDER_SITE_OTHER): Payer: Medicare HMO | Admitting: Nurse Practitioner

## 2018-02-14 ENCOUNTER — Encounter: Payer: Self-pay | Admitting: Nurse Practitioner

## 2018-02-14 VITALS — BP 133/66 | HR 83 | Temp 96.9°F | Ht 67.0 in | Wt 124.0 lb

## 2018-02-14 DIAGNOSIS — J439 Emphysema, unspecified: Secondary | ICD-10-CM

## 2018-02-14 DIAGNOSIS — I5032 Chronic diastolic (congestive) heart failure: Secondary | ICD-10-CM

## 2018-02-14 DIAGNOSIS — F0391 Unspecified dementia with behavioral disturbance: Secondary | ICD-10-CM | POA: Diagnosis not present

## 2018-02-14 DIAGNOSIS — I10 Essential (primary) hypertension: Secondary | ICD-10-CM

## 2018-02-14 DIAGNOSIS — C3490 Malignant neoplasm of unspecified part of unspecified bronchus or lung: Secondary | ICD-10-CM | POA: Diagnosis not present

## 2018-02-14 DIAGNOSIS — N400 Enlarged prostate without lower urinary tract symptoms: Secondary | ICD-10-CM

## 2018-02-14 DIAGNOSIS — N183 Chronic kidney disease, stage 3 unspecified: Secondary | ICD-10-CM

## 2018-02-14 DIAGNOSIS — F411 Generalized anxiety disorder: Secondary | ICD-10-CM | POA: Diagnosis not present

## 2018-02-14 DIAGNOSIS — E785 Hyperlipidemia, unspecified: Secondary | ICD-10-CM | POA: Diagnosis not present

## 2018-02-14 DIAGNOSIS — I5030 Unspecified diastolic (congestive) heart failure: Secondary | ICD-10-CM | POA: Diagnosis not present

## 2018-02-14 DIAGNOSIS — D649 Anemia, unspecified: Secondary | ICD-10-CM

## 2018-02-14 MED ORDER — CLONAZEPAM 1 MG PO TABS: 1.0000 mg | ORAL_TABLET | Freq: Three times a day (TID) | ORAL | 2 refills | Status: DC

## 2018-02-14 NOTE — Progress Notes (Signed)
Subjective:    Patient ID: Steven [name], male    DOB: 07-27-1928, 85 y.o.   MRN: [id number]   Chief Complaint: Medical Management of Chronic Issues * accompanied by daughter Narda Rutherford  HPI:  1. Essential hypertension  No c/o chest pain, sob or headaches. Does not check blood pressure at home. BP Readings from Last 3 Encounters:  02/14/18 133/66  02/07/18 117/67  02/04/18 (!) 122/51     2. Anxiety state  Is on klonopin 3x a day and he stays anxious. Daughter says he cannot function without klonopin.  3. Chronic diastolic congestive heart failure (HCC)  Denies swelling. Saw cardiology in December 2019 . According to office note there were no changes made to plan of care. He is to follow up in 6 months  4. Squamous cell carcinoma of lung, unspecified laterality ([place])  Was years ago and was released by oncology years ago. He is doing well. Has occasional cough.  5. Pulmonary emphysema, unspecified emphysema type (HCC)  Occasional cough. He quit smoking and is now dipping snuff.  6. Dementia with behavioral disturbance, unspecified dementia type [facility])  daughter says his short term memory is getting worse.  7. CKD (chronic kidney disease), stage III ([place])  Will recheck labs today  8. Benign prostatic hyperplasia without lower urinary tract symptoms  Denies any trouble  voiding  9. Hyperlipidemia with target LDL less than 100  Has very poor appetite. They just let him eat what ever they can get him to eat  10. Chronic anemia  Will recheck labs today.    Outpatient Encounter Medications as of 02/14/2018  Medication Sig  . albuterol (PROVENTIL) (2.5 MG/3ML) 0.083% nebulizer solution Take 3 mLs (2.5 mg total) by nebulization every 2 (two) hours as needed for wheezing or shortness of breath.  [name] aspirin 81 MG tablet Take 81 mg by mouth daily.   [name] atorvastatin (LIPITOR) 10 MG tablet Take 1 tablet (10 mg total) by mouth daily.  . clonazePAM (KLONOPIN) 1 MG tablet Take 1 tablet (1 mg total)  by mouth 3 (three) times daily as needed.  . diltiazem (CARDIZEM CD) 120 MG 24 hr capsule TAKE 1 CAPSULE BY MOUTH ONCE A DAY.  [name] doxazosin (CARDURA) 2 MG tablet Take 1 tablet (2 mg total) by mouth daily.  [name] FERREX 150 150 MG capsule TAKE 1 CAPSULE BY MOUTH ONCE A DAY.  [name] ipratropium-albuterol (DUONEB) 0.5-2.5 (3) MG/3ML SOLN Take 3 mLs by nebulization 3 (three) times daily.  . furosemide (LASIX) 20 MG tablet Take 1 tablet (20 mg total) by mouth every other day.       New complaints: None today  Social history: Lives alone. Family check son him daily   Review of Systems  Constitutional: Negative for activity change and appetite change.  HENT: Negative.   Eyes: Negative for pain.  Respiratory: Negative for shortness of breath.   Cardiovascular: Negative for chest pain, palpitations and leg swelling.  Gastrointestinal: Negative for abdominal pain.  Endocrine: Negative for polydipsia.  Genitourinary: Negative.   Skin: Negative for rash.  Neurological: Negative for dizziness, weakness and headaches.  Hematological: Does not bruise/bleed easily.  Psychiatric/Behavioral: Negative.   All other systems reviewed and are negative.      Objective:   Physical Exam Vitals signs and nursing note reviewed.  Constitutional:      Appearance: Normal appearance. He is well-developed.  HENT:     Head: Normocephalic.     Nose: Nose normal.  Eyes:  Pupils: Pupils are equal, round, and reactive to light.  Neck:     Musculoskeletal: Normal range of motion and neck supple.     Thyroid: No thyroid mass or thyromegaly.     Vascular: No carotid bruit or JVD.     Trachea: Phonation normal.  Cardiovascular:     Rate and Rhythm: Normal rate and regular rhythm.  Pulmonary:     Effort: Pulmonary effort is normal. No respiratory distress.     Breath sounds: Normal breath sounds.  Abdominal:     General: Bowel sounds are normal.     Palpations: Abdomen is soft.     Tenderness: There is no  abdominal tenderness.  Musculoskeletal: Normal range of motion.  Lymphadenopathy:     Cervical: No cervical adenopathy.  Skin:    General: Skin is warm and dry.  Neurological:     Mental Status: He is alert and oriented to person, place, and time.  Psychiatric:        Behavior: Behavior normal.        Thought Content: Thought content normal.        Judgment: Judgment normal.    BP 133/66   Pulse 83   Temp (!) 96.9 F (36.1 C) (Oral)   Ht 5\' 7"  (1.702 m)   Wt 124 lb (56.2 kg)   BMI 19.42 kg/m      Assessment & Plan:  Steven [name] comes in today with chief complaint of Medical Management of Chronic Issues   Diagnosis and orders addressed:  1. Anxiety state Stress management  clonazePAM (KLONOPIN) 1 MG tablet; Take 1 tablet (1 mg total) by mouth 3 (three) times daily.  Dispense: 90 tablet; Refill: 2  2. Essential hypertension Low sodium diet  3. Chronic diastolic congestive heart failure ([place]) Keep followup with caridology  4. Squamous cell carcinoma of lung, unspecified laterality ([place])  5. Pulmonary emphysema, unspecified emphysema type ([place])  6. Dementia with behavioral disturbance, unspecified dementia type ([place]) [place] daily  7. CKD (chronic kidney disease), stage III ([place]) Labs pending  8. Benign prostatic hyperplasia without lower urinary tract symptoms Let me know if have any problems voiding  9. Hyperlipidemia with target LDL less than 100 Low fat diet  10. Chronic anemia Labs pending   Labs pending Health Maintenance reviewed Diet and exercise encouraged  Follow up plan: 6 months   Mary-Margaret Hassell Done, [name]

## 2018-02-14 NOTE — Patient Instructions (Signed)
Dementia Caregiver Guide Dementia is a term used to describe a number of symptoms that affect memory and thinking. The most common symptoms include:  Memory loss.  Trouble with language and communication.  Trouble concentrating.  Poor judgment.  Problems with reasoning.  Child-like behavior and language.  Extreme anxiety.  Angry outbursts.  Wandering from home or public places. Dementia usually gets worse slowly over time. In the early stages, people with dementia can stay independent and safe with some help. In later stages, they need help with daily tasks such as dressing, grooming, and using the bathroom. How to help the person with dementia cope Dementia can be frightening and confusing. Here are some tips to help the person with dementia cope with changes caused by the disease. General tips  Keep the person on track with his or her routine.  Try to identify areas where the person may need help.  Be supportive, patient, calm, and encouraging.  Gently remind the person that adjusting to changes takes time.  Help with the tasks that the person has asked for help with.  Keep the person involved in daily tasks and decisions as much as possible.  Encourage conversation, but try not to get frustrated or harried if the person struggles to find words or does not seem to appreciate your help. Communication tips  When the person is talking or seems frustrated, make eye contact and hold the person's hand.  Ask specific questions that need yes or no answers.  Use simple words, short sentences, and a calm voice. Only give one direction at a time.  When offering choices, limit them to just 1 or 2.  Avoid correcting the person in a negative way.  If the person is struggling to find the right words, gently try to help him or her. How to recognize symptoms of stress Symptoms of stress in caregivers include:  Feeling frustrated or angry with the person with  dementia.  Denying that the person has dementia or that his or her symptoms will not improve.  Feeling hopeless and unappreciated.  Difficulty sleeping.  Difficulty concentrating.  Feeling anxious, irritable, or depressed.  Developing stress-related health problems.  Feeling like you have too little time for your own life. Follow these instructions at home:   Make sure that you and the person you are caring for: ? Get regular sleep. ? Exercise regularly. ? Eat regular, nutritious meals. ? Drink enough fluid to keep your urine clear or pale yellow. ? Take over-the-counter and prescription medicines only as told by your health care providers. ? Attend all scheduled health care appointments.  Join a support group with others who are caregivers.  Ask about respite care resources so that you can have a regular break from the stress of caregiving.  Look for signs of stress in yourself and in the person you are caring for. If you notice signs of stress, take steps to manage it.  Consider any safety risks and take steps to avoid them.  Organize medications in a pill box for each day of the week.  Create a plan to handle any legal or financial matters. Get legal or financial advice if needed.  Keep a calendar in a central location to remind the person of appointments or other activities. Tips for reducing the risk of injury  Keep floors clear of clutter. Remove rugs, magazine racks, and floor lamps.  Keep hallways well lit, especially at night.  Put a handrail and nonslip mat in the bathtub or  shower.  Put childproof locks on cabinets that contain dangerous items, such as medicines, alcohol, guns, toxic cleaning items, sharp tools or utensils, matches, and lighters.  Put the locks in places where the person cannot see or reach them easily. This will help ensure that the person does not wander out of the house and get lost.  Be prepared for emergencies. Keep a list of  emergency phone numbers and addresses in a convenient area.  Remove car keys and lock garage doors so that the person does not try to get in the car and drive.  Have the person wear a bracelet that tracks locations and identifies the person as having memory problems. This should be worn at all times for safety. Where to find support: Many individuals and organizations offer support. These include:  Support groups for people with dementia and for caregivers.  Counselors or therapists.  Home health care services.  Adult day care centers. Where to find more information Alzheimer's Association: [employer] Contact a health care provider if:  The person's health is rapidly getting worse.  You are no longer able to care for the person.  Caring for the person is affecting your physical and emotional health.  The person threatens himself or herself, you, or anyone else. Summary  Dementia is a term used to describe a number of symptoms that affect memory and thinking.  Dementia usually gets worse slowly over time.  Take steps to reduce the person's risk of injury, and to plan for future care.  Caregivers need support, relief from caregiving, and time for their own lives. This information is not intended to replace advice given to you by your health care provider. Make sure you discuss any questions you have with your health care provider. Document Released: 12/27/2015 Document Revised: 12/27/2015 Document Reviewed: 12/27/2015 Elsevier Interactive Patient Education  2019 [employer].

## 2018-02-15 LAB — CMP14+EGFR
ALT: 16 IU/L (ref 0–44)
AST: 19 IU/L (ref 0–40)
Albumin/Globulin Ratio: 1.4 (ref 1.2–2.2)
Albumin: 4.2 g/dL (ref 3.5–4.7)
Alkaline Phosphatase: 73 IU/L (ref 39–117)
BUN/Creatinine Ratio: 12 (ref 10–24)
BUN: 17 mg/dL (ref 8–27)
Bilirubin Total: 0.3 mg/dL (ref 0.0–1.2)
CO2: 26 mmol/L (ref 20–29)
Calcium: 9.9 mg/dL (ref 8.6–10.2)
Chloride: 99 mmol/L (ref 96–106)
Creatinine, Ser: 1.46 mg/dL — ABNORMAL HIGH (ref 0.76–1.27)
GFR calc Af Amer: 49 mL/min/{1.73_m2} — ABNORMAL LOW (ref >59)
GFR calc non Af Amer: 43 mL/min/{1.73_m2} — ABNORMAL LOW (ref >59)
Globulin, Total: 2.9 g/dL (ref 1.5–4.5)
Glucose: 88 mg/dL (ref 65–99)
Potassium: 4.9 mmol/L (ref 3.5–5.2)
Sodium: 138 mmol/L (ref 134–144)
Total Protein: 7.1 g/dL (ref 6.0–8.5)

## 2018-02-15 LAB — CBC WITH DIFFERENTIAL/PLATELET
Basophils Absolute: 0.1 10*3/uL (ref 0.0–0.2)
Basos: 2 %
EOS (ABSOLUTE): 0.5 10*3/uL — ABNORMAL HIGH (ref 0.0–0.4)
Eos: 9 %
Hematocrit: 33.2 % — ABNORMAL LOW (ref 37.5–51.0)
Hemoglobin: 11 g/dL — ABNORMAL LOW (ref 13.0–17.7)
Immature Grans (Abs): 0 10*3/uL (ref 0.0–0.1)
Immature Granulocytes: 0 %
Lymphocytes Absolute: 1.7 10*3/uL (ref 0.7–3.1)
Lymphs: 32 %
MCH: 29.5 pg (ref 26.6–33.0)
MCHC: 33.1 g/dL (ref 31.5–35.7)
MCV: 89 fL (ref 79–97)
Monocytes Absolute: 0.5 10*3/uL (ref 0.1–0.9)
Monocytes: 10 %
Neutrophils Absolute: 2.5 10*3/uL (ref 1.4–7.0)
Neutrophils: 47 %
Platelets: 174 10*3/uL (ref 150–450)
RBC: 3.73 x10E6/uL — ABNORMAL LOW (ref 4.14–5.80)
RDW: 16.3 % — ABNORMAL HIGH (ref 11.6–15.4)
WBC: 5.2 10*3/uL (ref 3.4–10.8)

## 2018-02-15 LAB — LIPID PANEL
Chol/HDL Ratio: 1.9 ratio (ref 0.0–5.0)
Cholesterol, Total: 124 mg/dL (ref 100–199)
HDL: 67 mg/dL (ref >39)
LDL Calculated: 41 mg/dL (ref 0–99)
Triglycerides: 78 mg/dL (ref 0–149)
VLDL Cholesterol Cal: 16 mg/dL (ref 5–40)

## 2018-02-18 ENCOUNTER — Telehealth: Payer: Medicare HMO

## 2018-02-27 ENCOUNTER — Ambulatory Visit: Payer: Self-pay | Admitting: Licensed Clinical Social Worker

## 2018-02-27 DIAGNOSIS — I10 Essential (primary) hypertension: Secondary | ICD-10-CM | POA: Diagnosis not present

## 2018-02-27 DIAGNOSIS — N183 Chronic kidney disease, stage 3 unspecified: Secondary | ICD-10-CM

## 2018-02-27 DIAGNOSIS — F0391 Unspecified dementia with behavioral disturbance: Secondary | ICD-10-CM | POA: Diagnosis not present

## 2018-02-27 DIAGNOSIS — F411 Generalized anxiety disorder: Secondary | ICD-10-CM

## 2018-02-27 DIAGNOSIS — F05 Delirium due to known physiological condition: Secondary | ICD-10-CM | POA: Diagnosis not present

## 2018-02-27 DIAGNOSIS — F0390 Unspecified dementia without behavioral disturbance: Secondary | ICD-10-CM

## 2018-02-27 DIAGNOSIS — I5032 Chronic diastolic (congestive) heart failure: Secondary | ICD-10-CM

## 2018-02-27 DIAGNOSIS — J439 Emphysema, unspecified: Secondary | ICD-10-CM | POA: Diagnosis not present

## 2018-02-27 NOTE — Patient Instructions (Addendum)
Licensed Clinical Social Worker Visit Information  Goals we discussed today:  Goals Addressed            This Visit's Progress   . "I want to make sure I can keep control of my anxiety"  (pt-stated)       Clinical Social Work Clinical Goal(s): Over the next 30 days client and family caregiver will verbalize understanding of long term plan for management of anxiety with panic attacks.    Interventions: . Telephone interview with patient regarding anxiety, panic attack issues  . Review with client of strategies to manage symptoms faced.  . Encouraged client to talk with [name], [place], as needed regarding effectiveness of medication prescribed for anxiety  Patient Self Care Activities:  . Participates in morning breakfast gathering with friends. . Attends Tuesday night bingo at [employer] . Socializes with family. Sees son each Saturday.  Talks openly about his struggles with anxiety and medications taken for anxiety.     Nurse Case Manager Clinical Goal(s): Over the next 30 days, patient and/or son (primary caregiver) will verbalize understanding of role of CCM services in assisting patient with chronic management of anxiety  Interventions: discussed role of CCM services in long term/chronic management of anxiety; discussed medication management r/t to anxiety Reviewed patient's current symptoms. Recommended stress management measures. Recommended community resources Geophysicist/field seismologist center) to address patient's reported depression related to social isolation. Referral to LCSW for psychosocial support.  Patient reports that clonazepam is currently not being included in his prefilled blister packs from the pharmacy. Patient did not notify pharmacy or practice regarding this omission. Taking from old pill bottle that he had on hand.  RN Case Manager to address with provider. Education provided to patient and son regarding proactive medication management as part of plan of care.      [name] "I want to talk with my son about advanced directive" (pt-stated)       Current Barriers:  [name] Mental Health Concerns   Clinical Social Work Clinical Goal(s): Over the next 30 days, client will work with LCSW to address concerns related to completion of HCPOA  Interventions: . LCSW spoke with client about completion of HCPOA . LCSW mailed, at client request, Advanced Directives booklet to son of client . LCSW encouraged client to talk with his son about completion of HCPOA  Patient Self Care Activities:  . Client talks with his son regarding medical decisions for client.  Plan:  . LCSW to call client in 3 weeks to discuss HCPOA completion with client.  . Client to call LCSW as needed to discuss psychosocial needs of client.   *initial goal documentation        Materials Provided: LCSW mailed, at client request, Advanced Directives Packet to son of client.  The patient verbalized understanding of instructions provided today and declined a print copy of patient instruction materials.    Follow Up Plan: Appointment scheduled for LCSW follow up with client by phone on:  03/20/2018  [name].Leslee Haueter MSW, LCSW Licensed Clinical Social Worker [facility] [phone]

## 2018-02-27 NOTE — Chronic Care Management (AMB) (Addendum)
Goals Addressed            This Visit's Progress   . "I want to make sure I can keep control of my anxiety"  (pt-stated)       Clinical Social Work Clinical Goal(s): Over the next 30 days client and family caregiver will verbalize understanding of long term plan for management of anxiety with panic attacks.    Interventions: . Telephone interview with patient regarding anxiety, panic attack issues  . Review with client of strategies to manage symptoms faced.  . Encouraged client to talk with [name], [place], as needed regarding effectiveness of medication prescribed for anxiety  Patient Self Care Activities:  . Participates in morning breakfast gathering with friends. . Attends Tuesday night bingo at [employer] . Socializes with family. Sees son each Saturday.  Talks openly about his struggles with anxiety and medications taken for anxiety.     Nurse Case Manager Clinical Goal(s): Over the next 30 days, patient and/or son (primary caregiver) will verbalize understanding of role of CCM services in assisting patient with chronic management of anxiety  Interventions: discussed role of CCM services in long term/chronic management of anxiety; discussed medication management r/t to anxiety Reviewed patient's current symptoms. Recommended stress management measures. Recommended community resources Geophysicist/field seismologist center) to address patient's reported depression related to social isolation. Referral to LCSW for psychosocial support.  Patient reports that clonazepam is currently not being included in his prefilled blister packs from the pharmacy. Patient did not notify pharmacy or practice regarding this omission. Taking from old pill bottle that he had on hand.  RN Case Manager to address with provider. Education provided to patient and son regarding proactive medication management as part of plan of care.     [name] "I want to talk with my son about advanced directive" (pt-stated)        Current Barriers:  [name] Mental Health Concerns   Clinical Social Work Clinical Goal(s): Over the next 30 days, client will work with LCSW to address concerns related to completion of HCPOA  Interventions: . LCSW spoke with client about completion of HCPOA . LCSW mailed, at client request, Advanced Directives booklet to son of client . LCSW encouraged client to talk with his son about completion of HCPOA  Patient Self Care Activities:  . Client talks with his son regarding medical decisions for client.  Plan:  . LCSW to call client in 3 weeks to discuss HCPOA completion with client.  . Client to call LCSW as needed to discuss psychosocial needs of client.   *initial goal documentation        Chronic Care Management from 02/10/2018 in [place]  PHQ-9 Total Score  7     GAD 7 : Generalized Anxiety Score 05/08/2016  Nervous, Anxious, on Edge 1  Control/stop worrying 1  Worry too much - different things 0  Trouble relaxing 1  Restless 1  Easily annoyed or irritable 0  Afraid - awful might happen 0  Total GAD 7 Score 4  Anxiety Difficulty Not difficult at all   Plan:   LCSW to call client in 3 weeks to discuss HCPOA completion with client.  LCSW to call client in 3 weeks to discuss anxiety/stress management for client.  Client to call LCSW as needed to discuss psychosocial needs of client  [name].Taneil Lazarus MSW, LCSW Licensed Clinical Social Worker [facility] [phone]  I have reviewed the CCM documentation and agree with  the written assessment and plan of care.  Mary-Margaret Hassell Done, [name]

## 2018-03-06 ENCOUNTER — Ambulatory Visit (INDEPENDENT_AMBULATORY_CARE_PROVIDER_SITE_OTHER): Payer: Medicare HMO | Admitting: Internal Medicine

## 2018-03-14 ENCOUNTER — Ambulatory Visit (INDEPENDENT_AMBULATORY_CARE_PROVIDER_SITE_OTHER): Payer: Medicare HMO | Admitting: Internal Medicine

## 2018-03-20 ENCOUNTER — Ambulatory Visit (INDEPENDENT_AMBULATORY_CARE_PROVIDER_SITE_OTHER): Payer: Medicare HMO | Admitting: Licensed Clinical Social Worker

## 2018-03-20 DIAGNOSIS — F411 Generalized anxiety disorder: Secondary | ICD-10-CM

## 2018-03-20 DIAGNOSIS — I10 Essential (primary) hypertension: Secondary | ICD-10-CM

## 2018-03-20 DIAGNOSIS — J439 Emphysema, unspecified: Secondary | ICD-10-CM | POA: Diagnosis not present

## 2018-03-20 NOTE — Patient Instructions (Addendum)
Licensed Clinical Water engineer Provided:  No  Goals we discussed today:              . "I want to make sure I can keep control of my anxiety" (pt-stated)        Clinical Social Work Clinical Goal(s):Over the next 30 days client and family caregiver will verbalize understanding of long term plan for management of anxiety with panic attacks.   Interventions:  Telephone interview with patient regarding anxiety, panic attack issues   Review with client of strategies to manage symptoms faced.   Encouraged client to talk with [name], [place], as needed regarding effectiveness of medication prescribed for anxiety  Provided counseling support for client  Encouraged client to socialize with friends as means of reducing anxiety, stress.  Patient Self Care Activities:   Participates in morning breakfast gathering with friends.  Attends Tuesday night bingo at [employer] with family. Sees son each Saturday.    Talks openly about his struggles with anxiety and medications taken for anxiety.   Plan:   LCSW to call client in 3 weeks to discuss anxiety/stress management for client.   Client to call LCSW as needed to discuss psychosocial needs of client   Client to socialize with friends to help manage anxiety  Client to use relaxation techniques of choice to help manage anxiety symptoms  Nurse Case Manager Clinical Goal(s):Over the next 30 days, patient and/or son (primary caregiver) will verbalize understanding of role of CCM services in assisting patient with chronic management of anxiety  Interventions: discussed role of CCM services in long term/chronic management of anxiety; discussed medication management r/t to anxiety Reviewed patient's current symptoms. Recommended stress management measures. Recommended community resources Geophysicist/field seismologist center) to address patient's reported depression related to social  isolation. Referral to LCSW for psychosocial support.  Patient reports that clonazepam is currently not being included in his prefilled blister packs from the pharmacy.Patient did not notify pharmacy or practice regarding this omission. Taking from old pill bottle that he had on hand. RN Case Manager to address with provider. Education provided to patient and son regarding proactive medication management as part of plan of care.     [name] "I want to talk with my son about advanced directive" (pt-stated)        Current Barriers:  Mental Health Concerns   Clinical Social Work Clinical Goal(s):Over the next 30 days, client will work with LCSW to address concerns related to completion of HCPOA  Interventions:  LCSW spoke with client about completion of HCPOA  LCSW previously mailed, at client request, Advanced Directives booklet to son of client  LCSW encouraged client to talk with his son about completion of HCPOA  Patient Self Care Activities:   Client talks with his son regarding medical decisions for client.  Plan:   LCSW to call client in 3 weeks to discuss HCPOA completion with client.   Client to call LCSW as needed to discuss psychosocial needs of client.   *initial goal documentation   Follow Up Plan:  LCSW to call client in 3 weeks to discuss anxiety/stress management for client and to discuss client progress related to Advanced Directive completion          The patient verbalized understanding of instructions provided today and declined a print copy of patient instruction materials.   [name].Lenetta Piche MSW, LCSW Licensed Clinical Social Worker [facility] [phone]

## 2018-03-20 NOTE — Chronic Care Management (AMB) (Addendum)
Chronic Care Management    Clinical Social Work General Follow Up Note  03/20/2018 Name: Steven [name] MRN: [id number] DOB: [birth year]    Referred by: PCP, [name], [name] for psychosocial assessment  Review of patient status, including review of consultants reports, relevant laboratory and other test results, and collaboration with appropriate care team members and the patient's provider was performed as part of comprehensive patient evaluation and provision of chronic care management services.    Last CCM Appointment: 03/20/2018  Depression screen PHQ 2/9 02/14/2018  Decreased Interest 0  Down, Depressed, Hopeless 0  PHQ - 2 Score 0  Altered sleeping -  Tired, decreased energy -  Change in appetite -  Feeling bad or failure about yourself  -  Trouble concentrating -  Moving slowly or fidgety/restless -  Suicidal thoughts -  PHQ-9 Score -  Difficult doing work/chores -  Some recent data might be hidden     GAD 7 : Generalized Anxiety Score 05/08/2016  Nervous, Anxious, on Edge 1  Control/stop worrying 1  Worry too much - different things 0  Trouble relaxing 1  Restless 1  Easily annoyed or irritable 0  Afraid - awful might happen 0  Total GAD 7 Score 4  Anxiety Difficulty Not difficult at all      Goals Addressed                      This Visit's Progress    . "I want to make sure I can keep control of my anxiety"  (pt-stated)        Clinical Social Work Clinical Goal(s): Over the next 30 days client and family caregiver will verbalize understanding of long term plan for management of anxiety with panic attacks.    Interventions:  Telephone interview with patient regarding anxiety, panic attack issues   Review with client of strategies to manage symptoms faced.   Encouraged client to talk with [name], [place], as needed regarding effectiveness of medication prescribed for anxiety  Provided counseling support for  client  Encouraged client to socialize with friends as means of reducing anxiety, stress.  Patient Self Care Activities:   Participates in morning breakfast gathering with friends.  Attends Tuesday night bingo at [employer] with family. Sees son each Saturday.    Talks openly about his struggles with anxiety and medications taken for anxiety.   Plan:   LCSW to call client in 3 weeks to discuss anxiety/stress management for client.   Client to call LCSW as needed to discuss psychosocial needs of client   Client to socialize with friends to help manage anxiety  Client to use relaxation techniques of choice to help manage anxiety symptoms   Nurse Case Manager Clinical Goal(s): Over the next 30 days, patient and/or son (primary caregiver) will verbalize understanding of role of CCM services in assisting patient with chronic management of anxiety  Interventions: discussed role of CCM services in long term/chronic management of anxiety; discussed medication management r/t to anxiety Reviewed patient's current symptoms. Recommended stress management measures. Recommended community resources Geophysicist/field seismologist center) to address patient's reported depression related to social isolation. Referral to LCSW for psychosocial support.  Patient reports that clonazepam is currently not being included in his prefilled blister packs from the pharmacy. Patient did not notify pharmacy or practice regarding this omission. Taking from old pill bottle that he had on hand.  RN Case Manager to address with provider. Education provided to  patient and son regarding proactive medication management as part of plan of care.     [name] "I want to talk with my son about advanced directive" (pt-stated)        Current Barriers:   Mental Health Concerns   Clinical Social Work Clinical Goal(s): Over the next 30 days, client will work with LCSW to address concerns related to completion of  HCPOA  Interventions:  LCSW spoke with client about completion of HCPOA  LCSW previously mailed, at client request, Advanced Directives booklet to son of client  LCSW encouraged client to talk with his son about completion of HCPOA  Patient Self Care Activities:   Client talks with his son regarding medical decisions for client.  Plan:   LCSW to call client in 3 weeks to discuss HCPOA completion with client.   Client to call LCSW as needed to discuss psychosocial needs of client.   *initial goal documentation   Follow Up Plan:  LCSW to call client in 3 weeks to discuss anxiety/stress management for client and to discuss client progress related to Advanced Directive completion  [name].Henrry Feil MSW, LCSW Licensed Clinical Social Worker [facility] [phone]   "I have reviewed this encounter including the documentation in this note and/or discussed this patient with the nurse coordinator, K. HUDY . I am certifying that I agree with the content of this note as supervising physician." [place], [name]

## 2018-04-07 ENCOUNTER — Other Ambulatory Visit: Payer: Self-pay | Admitting: Pediatrics

## 2018-04-07 DIAGNOSIS — N400 Enlarged prostate without lower urinary tract symptoms: Secondary | ICD-10-CM

## 2018-04-08 ENCOUNTER — Ambulatory Visit (INDEPENDENT_AMBULATORY_CARE_PROVIDER_SITE_OTHER): Payer: Medicare HMO | Admitting: Licensed Clinical Social Worker

## 2018-04-08 DIAGNOSIS — I5032 Chronic diastolic (congestive) heart failure: Secondary | ICD-10-CM | POA: Diagnosis not present

## 2018-04-08 DIAGNOSIS — J439 Emphysema, unspecified: Secondary | ICD-10-CM

## 2018-04-08 DIAGNOSIS — I10 Essential (primary) hypertension: Secondary | ICD-10-CM

## 2018-04-08 DIAGNOSIS — F411 Generalized anxiety disorder: Secondary | ICD-10-CM

## 2018-04-08 NOTE — Chronic Care Management (AMB) (Addendum)
Chronic Care Management    Clinical Social Work General Follow Up Note  04/08/2018 Name: Steven [name] MRN: [id number] DOB: 10/25/1928  Steven [name] is a 85 y.o. year old male who is a primary care patient of [name], [place]. [name] [name] asked the CCM team to consult the patient for assistance with psychosocial assessment and counseling support.   Review of patient status, including review of consultants reports, relevant laboratory and other test results, and collaboration with appropriate care team members and the patient's provider was performed as part of comprehensive patient evaluation and provision of chronic care management services.    Depression screen [facility] 2/9 04/08/2018 02/14/2018 02/10/2018 02/07/2018 12/13/2017  Decreased Interest 1 0 1 0 0  Down, Depressed, Hopeless 1 0 2 1 0  PHQ - 2 Score 2 0 3 1 0  Altered sleeping 0 - 1 - -  Tired, decreased energy 1 - 1 - -  Change in appetite 0 - 0 - -  Feeling bad or failure about yourself  0 - 0 - -  Trouble concentrating 1 - 1 - -  Moving slowly or fidgety/restless 1 - 1 - -  Suicidal thoughts 0 - 0 - -  PHQ-9 Score 5 - 7 - -  Difficult doing work/chores Somewhat difficult - Somewhat difficult - -  Some recent data might be hidden    GAD 7 : Generalized Anxiety Score 05/08/2016  Nervous, Anxious, on Edge 1  Control/stop worrying 1  Worry too much - different things 0  Trouble relaxing 1  Restless 1  Easily annoyed or irritable 0  Afraid - awful might happen 0  Total GAD 7 Score 4  Anxiety Difficulty Not difficult at all                 Goals Addressed                      This Visit's Progress    . "I want to make sure I can keep control of my anxiety" (pt-stated)        Clinical Social Work Clinical Goal(s):Over the next 30 days client and family caregiver will verbalize understanding of long term plan for management of anxiety with panic attacks.    Interventions:  Telephone interview with patient/son of patient regarding anxiety, panic attack issues of client  Review with client /son of client  strategies to manage symptoms faced for client  Encouraged client to talk with [name], [place], as needed regarding effectiveness of medication prescribed for anxiety  Provided counseling support for client  Encouraged client to socialize with friends as means of reducing anxiety, stress.  Talked about client use of music to help reduce stress/anxiety of client  Patient Self Care Activities:   Participates in morning breakfast gathering with friends.  Attends Tuesday night bingo at [employer] with family. Sees son each Saturday.    Talks openly about his struggles with anxiety and medications taken for anxiety.   Occasionally plays music with friend to manage stress symptoms  Plan:   LCSW to call client/son of client in 3 weeks to discuss anxiety/stress management for client.   Client to call LCSW as needed to discuss psychosocial needs of client   Client to socialize with friends to help manage anxiety  Client to use relaxation techniques of choice to help manage anxiety symptoms (socialize with friends, play music/guitar as form of relaxation)   Nurse Case  Manager Clinical Goal(s):Over the next 30 days, patient and/or son (primary caregiver) will verbalize understanding of role of CCM services in assisting patient with chronic management of anxiety  Interventions: discussed role of CCM services in long term/chronic management of anxiety; discussed medication management r/t to anxiety Reviewed patient's current symptoms. Recommended stress management measures. Recommended community resources Geophysicist/field seismologist center) to address patient's reported depression related to social isolation. Referral to LCSW for psychosocial support.  Patient reports that clonazepam is currently not being included in  his prefilled blister packs from the pharmacy.Patient did not notify pharmacy or practice regarding this omission. Taking from old pill bottle that he had on hand. RN Case Manager to address with provider. Education provided to patient and son regarding proactive medication management as part of plan of care.     [name] "I want to talk with my son about advanced directive" (pt-stated)        Current Barriers:  Mental Health Concerns   Clinical Social Work Clinical Goal(s):Over the next 30 days, client will work with LCSW to address concerns related to completion of HCPOA  Interventions:  LCSW spoke with client/son of client about completion of HCPOA for client  LCSW again mailed, at client request, Advanced Directives booklet to son of client    LCSW encouraged client/son of client o talk about completion of HCPOA for client.  Patient Self Care Activities:   Client talks with his son regularly regarding medical decisions for client.  Plan:   LCSW to call client/son of client  in 3 weeks to discuss HCPOA completion with client.   Client to call LCSW as needed to discuss psychosocial needs of client.   *initial goal documentation   Follow Up Plan:  LCSW to call client/son of client in 3 weeks to discuss anxiety/stress management for client and to discuss client progress related to Advanced Directive completion  [name].Elvyn Krohn MSW, LCSW Licensed Clinical Social Worker [facility] [phone]   "I have reviewed this encounter including the documentation in this note and/or discussed this patient with the nurse coordinator, K. Hudy . I am certifying that I agree with the content of this note as supervising physician." [place], [name]

## 2018-04-08 NOTE — Patient Instructions (Addendum)
Licensed Clinical Water engineer Provided:  No  Goals we discussed today:     . "I want to make sure I can keep control of my anxiety" (pt-stated)        Clinical Social Work Clinical Goal(s):Over the next 30 days client and family caregiver will verbalize understanding of long term plan for management of anxiety with panic attacks.   Interventions:  Telephone interview with patient/son of patient regarding anxiety, panic attack issues of client  Review with client /son of client  strategies to manage symptoms faced for client  Encouraged client to talk with [name], [place], as needed regarding effectiveness of medication prescribed for anxiety  Provided counseling support for client  Encouraged client to socialize with friends as means of reducing anxiety, stress.  Talked about client use of music to help reduce stress/anxiety of client  Patient Self Care Activities:   Participates in morning breakfast gathering with friends.  Attends Tuesday night bingo at [employer] with family. Sees son each Saturday.    Talks openly about his struggles with anxiety and medications taken for anxiety.   Occasionally plays music with friend to manage stress symptoms  Plan:   LCSW to call client/son of client in 3 weeks to discuss anxiety/stress management for client.   Client to call LCSW as needed to discuss psychosocial needs of client   Client to socialize with friends to help manage anxiety  Client to use relaxation techniques of choice to help manage anxiety symptoms (socialize with friends, play music/guitar as form of relaxation)   Nurse Case Manager Clinical Goal(s):Over the next 30 days, patient and/or son (primary caregiver) will verbalize understanding of role of CCM services in assisting patient with chronic management of anxiety  Interventions: discussed role of CCM services in long term/chronic  management of anxiety; discussed medication management r/t to anxiety Reviewed patient's current symptoms. Recommended stress management measures. Recommended community resources Geophysicist/field seismologist center) to address patient's reported depression related to social isolation. Referral to LCSW for psychosocial support.  Patient reports that clonazepam is currently not being included in his prefilled blister packs from the pharmacy.Patient did not notify pharmacy or practice regarding this omission. Taking from old pill bottle that he had on hand. RN Case Manager to address with provider. Education provided to patient and son regarding proactive medication management as part of plan of care.     [name] "I want to talk with my son about advanced directive" (pt-stated)        Current Barriers:  Mental Health Concerns   Clinical Social Work Clinical Goal(s):Over the next 30 days, client will work with LCSW to address concerns related to completion of HCPOA  Interventions:  LCSW spoke with client/son of client about completion of HCPOA for client  LCSW again mailed, at client request, Advanced Directives booklet to son of client LCSW encouraged client/son of client o talk about completion of HCPOA for client.  Patient Self Care Activities:   Client talks with his son regularly regarding medical decisions for client.  Plan:   LCSW to call client/son of client  in 3 weeks to discuss HCPOA completion with client.   Client to call LCSW as needed to discuss psychosocial needs of client.   *initial goal documentation   Follow Up Plan:  LCSW to call client/son of client in 3 weeks to discuss anxiety/stress management for client and to discuss client progress related to Advanced Directive completion  The patient verbalized  understanding of instructions provided today and declined a print copy of patient instruction materials.   [name].Malaiah Viramontes MSW, LCSW Licensed Clinical Social  Worker [facility] [phone]

## 2018-04-28 ENCOUNTER — Ambulatory Visit: Payer: Self-pay | Admitting: Licensed Clinical Social Worker

## 2018-04-28 DIAGNOSIS — F411 Generalized anxiety disorder: Secondary | ICD-10-CM

## 2018-04-28 DIAGNOSIS — I5032 Chronic diastolic (congestive) heart failure: Secondary | ICD-10-CM

## 2018-04-28 DIAGNOSIS — J439 Emphysema, unspecified: Secondary | ICD-10-CM

## 2018-04-28 DIAGNOSIS — F0391 Unspecified dementia with behavioral disturbance: Secondary | ICD-10-CM

## 2018-04-28 DIAGNOSIS — I10 Essential (primary) hypertension: Secondary | ICD-10-CM

## 2018-04-28 NOTE — Chronic Care Management (AMB) (Addendum)
Care Management Note   Steven [name] is a 85 y.o. year old male who is a primary care patient of Steven [name], Steven [name]. The CM team was consulted for assistance with chronic disease management and care coordination.   I reached out to Steven [name] by phone today.   Review of patient status, including review of consultants reports, relevant laboratory and other test results, and collaboration with appropriate care team members and the patient's provider was performed as part of comprehensive patient evaluation and provision of chronic care management services.   Social Determinants of Health:  Risk for Tobacco exposure, Risk for Depression, Risk for Stress, some challenges in ambulation  GAD 7 : Generalized Anxiety Score 05/08/2016  Nervous, Anxious, on Edge 1  Control/stop worrying 1  Worry too much - different things 0  Trouble relaxing 1  Restless 1  Easily annoyed or irritable 0  Afraid - awful might happen 0  Total GAD 7 Score 4  Anxiety Difficulty Not difficult at all   Depression screen [facility] 2/9 04/08/2018 02/14/2018 02/10/2018  Decreased Interest 1 0 1  Down, Depressed, Hopeless 1 0 2  PHQ - 2 Score 2 0 3  Altered sleeping 0 - 1  Tired, decreased energy 1 - 1  Change in appetite 0 - 0  Feeling bad or failure about yourself  0 - 0  Trouble concentrating 1 - 1  Moving slowly or fidgety/restless 1 - 1  Suicidal thoughts 0 - 0  PHQ-9 Score 5 - 7  Difficult doing work/chores Somewhat difficult - Somewhat difficult  Some recent data might be hidden    Goals Addressed            This Visit's Progress   . "I want to make sure I can keep control of my anxiety"  (pt-stated)       Clinical Social Work Clinical Goal(s): Over the next 30 days client and family caregiver will verbalize understanding of long term plan for management of anxiety with panic attacks.    Interventions: . Telephone interview with patient/son of patient regarding anxiety, panic attack issues  .  Reviewed with client strategies to manage symptoms faced.  . Encouraged client to talk with Steven Pretty, [name], as needed regarding effectiveness of medication prescribed for anxiety . Encouraged client to communicate with family members as needed for support  Patient Self Care Activities:  . Participates in morning breakfast gathering with friends. . Attends Tuesday night bingo at [employer] . Socializes with family. Sees son each Saturday.  .  Talks openly about his struggles with anxiety and medications taken for anxiety.   Plan:   Client to communicate with family members as needed to discuss ongoing client needs. Client to communicate with LCSW as needed to discuss psychosocial needs of client LCSW to call client in next 3 weeks to discuss client management of anxiety and panic symptoms Client to attend scheduled medical appointments Client to communicate with RN CM as needed to discuss nursing needs of client.   Nurse Case Manager Clinical Goal(s): Over the next 30 days, patient and/or son (primary caregiver) will verbalize understanding of role of CCM services in assisting patient with chronic management of anxiety  Interventions: discussed role of CCM services in long term/chronic management of anxiety; discussed medication management r/t to anxiety Reviewed patient's current symptoms. Recommended stress management measures. Recommended community resources Geophysicist/field seismologist center) to address patient's reported depression related to social isolation. Referral to LCSW for psychosocial support.  Patient reports that clonazepam is currently not being included in his prefilled blister packs from the pharmacy. Patient did not notify pharmacy or practice regarding this omission. Taking from old pill bottle that he had on hand.  RN Case Manager to address with provider. Education provided to patient and son regarding proactive medication management as part of plan of care.     [name] "I want  to talk with my son about advanced directive" (pt-stated)       Current Barriers:  [name] Mental Health Concerns   Clinical Social Work Clinical Goal(s): Over the next 30 days, client will work with LCSW to address concerns related to completion of HCPOA  Interventions: . LCSW spoke with client about completion of HCPOA . LCSW mailed, at client request, Advanced Directives booklet to son of client . LCSW encouraged client to talk with his son about completion of HCPOA  Patient Self Care Activities:  . Client talks with his son regarding medical decisions for client.  Plan:  . LCSW to call client in 3 weeks to discuss HCPOA completion with client.  . Client to call LCSW as needed to discuss psychosocial needs of client.  . Client to communicate with his son regarding completion of HCPOA for client  *initial goal documentation     Follow Up Plan: LCSW to call client in next 3 weeks to talk with client about his management of anxiety and stress symptoms and about his progress related to HCPOA completion  Steven [name].Steven [name] MSW, LCSW Licensed Clinical Social Worker [place] [facility] [phone]  "I have reviewed this encounter including the documentation in this note and/or discussed this patient with the nurse coordinator, Steven Flattery, RN . I am certifying that I agree with the content of this note as supervising physician." [name], [name]

## 2018-04-28 NOTE — Patient Instructions (Signed)
Licensed Clinical Social Worker Visit Information  Goals we discussed today:  Goals Addressed            This Visit's Progress   . "I want to make sure I can keep control of my anxiety"  (pt-stated)       Clinical Social Work Clinical Goal(s): Over the next 30 days client and family caregiver will verbalize understanding of long term plan for management of anxiety with panic attacks.    Interventions: . Telephone interview with patient regarding anxiety, panic attack issues  . Reviewed with client strategies to manage symptoms faced.  . Encouraged client to talk with [name], [name], as needed regarding effectiveness of medication prescribed for anxiety . Encouraged client to communicate with family members for support  Patient Self Care Activities:  . Participates in morning breakfast gathering with friends. . Attends Tuesday night bingo at [employer] . Socializes with family. Sees son each Saturday.  Talks openly about his struggles with anxiety and medications taken for anxiety.   Plan:   Client to communicate with family members as needed to discuss ongoing client needs. Client to communicate with LCSW as needed to discuss psychosocial needs of client LCSW to call client in next 3 weeks to discuss client management of anxiety and panic symptoms Client to attend scheduled medical appointments Client to communicate with RN CM as needed to discuss nursing needs of client.   Nurse Case Manager Clinical Goal(s): Over the next 30 days, patient and/or son (primary caregiver) will verbalize understanding of role of CCM services in assisting patient with chronic management of anxiety  Interventions: discussed role of CCM services in long term/chronic management of anxiety; discussed medication management r/t to anxiety Reviewed patient's current symptoms. Recommended stress management measures. Recommended community resources Geophysicist/field seismologist center) to address patient's reported  depression related to social isolation. Referral to LCSW for psychosocial support.  Patient reports that clonazepam is currently not being included in his prefilled blister packs from the pharmacy. Patient did not notify pharmacy or practice regarding this omission. Taking from old pill bottle that he had on hand.  RN Case Manager to address with provider. Education provided to patient and son regarding proactive medication management as part of plan of care.     [name] "I want to talk with my son about advanced directive" (pt-stated)       Current Barriers:  [name] Mental Health Concerns   Clinical Social Work Clinical Goal(s): Over the next 30 days, client will work with LCSW to address concerns related to completion of HCPOA  Interventions: . LCSW spoke with client about completion of HCPOA . LCSW mailed, at client request, Advanced Directives booklet to son of client . LCSW encouraged client to talk with his son about completion of HCPOA  Patient Self Care Activities:  . Client talks withLCSW his son regarding medical decisions for client.  Plan:  . LCSW to call client in 3 weeks to discuss HCPOA completion with client.  . Client to call LCSW as needed to discuss psychosocial needs of client.  . Client to communicate with his son regarding completion of HCPOA for client  *initial goal documentation    Materials Provided: No  Follow Up Plan: LCSW to call client in next 3 weeks to talk with client about his management of anxiety and stress symptoms and to talk with client about his progress related to HCPOA completion  The patient verbalized understanding of instructions provided today and declined a print copy  of patient instruction materials.   [name].Steven [name] MSW, LCSW Licensed Clinical Social Worker [facility] [phone]

## 2018-04-30 ENCOUNTER — Other Ambulatory Visit: Payer: Self-pay | Admitting: Pediatrics

## 2018-04-30 DIAGNOSIS — F411 Generalized anxiety disorder: Secondary | ICD-10-CM

## 2018-04-30 NOTE — Telephone Encounter (Signed)
Last seen 02/14/2018

## 2018-04-30 NOTE — Telephone Encounter (Signed)
Patient had filled on 04/09/18 and cannot refill till 05/10/18

## 2018-05-01 ENCOUNTER — Other Ambulatory Visit: Payer: Self-pay | Admitting: Pediatrics

## 2018-05-01 DIAGNOSIS — F411 Generalized anxiety disorder: Secondary | ICD-10-CM

## 2018-05-06 ENCOUNTER — Telehealth: Payer: Medicare HMO

## 2018-05-19 ENCOUNTER — Ambulatory Visit (INDEPENDENT_AMBULATORY_CARE_PROVIDER_SITE_OTHER): Payer: Medicare HMO | Admitting: Licensed Clinical Social Worker

## 2018-05-19 DIAGNOSIS — J439 Emphysema, unspecified: Secondary | ICD-10-CM

## 2018-05-19 DIAGNOSIS — I10 Essential (primary) hypertension: Secondary | ICD-10-CM

## 2018-05-19 DIAGNOSIS — N183 Chronic kidney disease, stage 3 unspecified: Secondary | ICD-10-CM

## 2018-05-19 DIAGNOSIS — F0391 Unspecified dementia with behavioral disturbance: Secondary | ICD-10-CM

## 2018-05-19 DIAGNOSIS — I5032 Chronic diastolic (congestive) heart failure: Secondary | ICD-10-CM

## 2018-05-19 DIAGNOSIS — F411 Generalized anxiety disorder: Secondary | ICD-10-CM

## 2018-05-19 NOTE — Chronic Care Management (AMB) (Addendum)
Care Management Note   Steven [name] is a 85 y.o. year old male who is a primary care patient of Steven [name], [place]. The CM team was consulted for assistance with chronic disease management and care coordination.   I reached out to Bud Face [place] by phone today.   Review of patient status, including review of consultants reports, relevant laboratory and other test results, and collaboration with appropriate care team members and the patient's provider was performed as part of comprehensive patient evaluation and provision of chronic care management services.   Social Determinants of Health:  Risk for Tobacco exposure; Risk for Depression; Risk for Stress  GAD 7 : Generalized Anxiety Score 05/08/2016  Nervous, Anxious, on Edge 1  Control/stop worrying 1  Worry too much - different things 0  Trouble relaxing 1  Restless 1  Easily annoyed or irritable 0  Afraid - awful might happen 0  Total GAD 7 Score 4  Anxiety Difficulty Not difficult at all     Chronic Care Management from 04/08/2018 in [facility]  PHQ-9 Total Score  5     Goals    . "I want to make sure I can keep control of my anxiety"  (pt-stated)     Clinical Social Work Clinical Goal(s): Over the next 30 days client and family caregiver will verbalize understanding of long term plan for management of anxiety with panic attacks.    Interventions: . Review with client of strategies to manage symptoms faced.  . Encouraged client to talk with Steven Pretty, [name], as needed regarding effectiveness of medication prescribed for anxiety  .  Encouraged client to use relaxation techniques of choice to manage symptoms (talks with friends via phone or in person, watches favorite TV shows, communicates regularly with his son)  . Talked with client about client social support network  Patient Self Care Activities:  . Participates in morning breakfast gathering with friends. Steven [name] with family.  Sees son each Saturday.  .  Talks openly about his struggles with anxiety and medications taken for anxiety.   Plan:   Client to communicate with family members as needed to discuss ongoing client needs. Client to communicate with LCSW as needed to discuss psychosocial needs of client LCSW to call client in next 3 weeks to discuss client management of anxiety and panic symptoms Client to attend scheduled medical appointments Client to communicate with RN CM as needed to discuss nursing needs of client.        Steven [name] "I want to talk with my son about advanced directive" (pt-stated)     Current Barriers:  Steven [name] Mental Health Concerns   Clinical Social Work Clinical Goal(s): Over the next 30 days, client will work with LCSW to address concerns related to completion of HCPOA  Interventions: . LCSW spoke with client about completion of HCPOA . LCSW has previously mailed, at client request, Advanced Directives booklet to son of client . LCSW encouraged client to talk with his son about completion of HCPOA  Patient Self Care Activities:  . Client talks with his son regarding medical decisions for client.  Plan:  . LCSW to call client in 3 weeks to discuss HCPOA completion with client.  . Client to call LCSW as needed to discuss psychosocial needs of client.  . Client to communicate with his son regarding completion of HCPOA for client  *initial goal documentation    Client said he is driving to appointments and to complete errands.  Client said he obtains his own groceries and said that his son helps him manage his (client's ) medications  Follow Up Plan: LCSW to call client in next 3 weeks to talk with client about client management of anxiety and panic symptoms faced and to assess client status related to advanced directives completion  Steven [name].Steven [name] MSW, LCSW Licensed Clinical Social Worker [place] [facility] [phone]  "I have reviewed this  encounter including the documentation in this note and/or discussed this patient with [name] . I am certifying that I agree with the content of this note as supervising physician." [place], [name]

## 2018-05-19 NOTE — Patient Instructions (Signed)
Licensed Clinical Social Worker Visit Information  Goals we discussed today:   Goals    . "I want to make sure I can keep control of my anxiety"  (pt-stated)     Clinical Social Work Clinical Goal(s): Over the next 30 days client and family caregiver will verbalize understanding of long term plan for management of anxiety with panic attacks.    Interventions: . Review with client of strategies to manage symptoms faced.  . Encouraged client to talk with [name], [name], as needed regarding effectiveness of medication prescribed for anxiety . Talked with client about social support network . Encouraged client to use relaxation techniques to manage symptoms faced (relaxation techniques such as speaking via phone or in person with family or friends, watching favorite TV shows, speaking regularly with his son)  Patient Self Care Activities:  . Participates in morning breakfast gathering with friends. . Attends Tuesday night bingo at [employer] . Socializes with family. Sees son each Saturday.  Talks openly about his struggles with anxiety and medications taken for anxiety.   Plan:   Client to communicate with family members as needed to discuss ongoing client needs. Client to communicate with LCSW as needed to discuss psychosocial needs of client LCSW to call client in next 3 weeks to discuss client management of anxiety and panic symptoms Client to attend scheduled medical appointments Client to communicate with RN CM as needed to discuss nursing needs of client.          [name] "I want to talk with my son about advanced directive" (pt-stated)     Current Barriers:  [name] Mental Health Concerns   Clinical Social Work Clinical Goal(s): Over the next 30 days, client will work with LCSW to address concerns related to completion of HCPOA  Interventions: . LCSW spoke with client about completion of HCPOA . LCSW previously mailed, at client request, Advanced Directives booklet to son of  client . LCSW encouraged client to talk with his son about completion of HCPOA  Patient Self Care Activities:  . Client talks with his son regarding medical decisions for client.  Plan:  . LCSW to call client in 3 weeks to discuss HCPOA completion with client.  . Client to call LCSW as needed to discuss psychosocial needs of client.  . Client to communicate with his son regarding completion of HCPOA for client  *initial goal documentation  Materials Provided: No  Follow Up Plan: LCSW to call client in next 3 weeks to talk with client about his management of anxiety and panic symptoms and to talk with client about his status regarding advanced directive completion  The patient verbalized understanding of instructions provided today and declined a print copy of patient instruction materials.   [name].Karelly Dewalt MSW, LCSW Licensed Clinical Social Worker [facility] [phone]

## 2018-05-20 ENCOUNTER — Other Ambulatory Visit: Payer: Self-pay

## 2018-05-20 ENCOUNTER — Ambulatory Visit (INDEPENDENT_AMBULATORY_CARE_PROVIDER_SITE_OTHER): Payer: Medicare HMO | Admitting: Nurse Practitioner

## 2018-05-20 ENCOUNTER — Encounter: Payer: Self-pay | Admitting: Nurse Practitioner

## 2018-05-20 DIAGNOSIS — E785 Hyperlipidemia, unspecified: Secondary | ICD-10-CM

## 2018-05-20 DIAGNOSIS — I10 Essential (primary) hypertension: Secondary | ICD-10-CM | POA: Diagnosis not present

## 2018-05-20 DIAGNOSIS — N400 Enlarged prostate without lower urinary tract symptoms: Secondary | ICD-10-CM | POA: Diagnosis not present

## 2018-05-20 DIAGNOSIS — C3490 Malignant neoplasm of unspecified part of unspecified bronchus or lung: Secondary | ICD-10-CM

## 2018-05-20 DIAGNOSIS — F411 Generalized anxiety disorder: Secondary | ICD-10-CM | POA: Diagnosis not present

## 2018-05-20 DIAGNOSIS — F0390 Unspecified dementia without behavioral disturbance: Secondary | ICD-10-CM

## 2018-05-20 DIAGNOSIS — N183 Chronic kidney disease, stage 3 unspecified: Secondary | ICD-10-CM

## 2018-05-20 DIAGNOSIS — J439 Emphysema, unspecified: Secondary | ICD-10-CM

## 2018-05-20 DIAGNOSIS — I5032 Chronic diastolic (congestive) heart failure: Secondary | ICD-10-CM

## 2018-05-20 MED ORDER — ATORVASTATIN CALCIUM 10 MG PO TABS: ORAL_TABLET | ORAL | 1 refills | Status: DC

## 2018-05-20 MED ORDER — DILTIAZEM HCL ER COATED BEADS 120 MG PO CP24: 120.0000 mg | ORAL_CAPSULE | Freq: Every day | ORAL | 1 refills | Status: DC

## 2018-05-20 MED ORDER — CLONAZEPAM 1 MG PO TABS: ORAL_TABLET | ORAL | 1 refills | Status: DC

## 2018-05-20 MED ORDER — DOXAZOSIN MESYLATE 2 MG PO TABS: ORAL_TABLET | ORAL | 1 refills | Status: DC

## 2018-05-20 MED ORDER — FUROSEMIDE 20 MG PO TABS: 20.0000 mg | ORAL_TABLET | ORAL | 1 refills | Status: DC

## 2018-05-20 MED ORDER — POLYSACCHARIDE IRON COMPLEX 150 MG PO CAPS: 150.0000 mg | ORAL_CAPSULE | Freq: Every day | ORAL | 1 refills | Status: DC

## 2018-05-20 NOTE — Progress Notes (Signed)
Patient ID: Steven [name], male   DOB: 03-25-1928, 85 y.o.   MRN: [id number]   .  Virtual Visit via telephone Note  I connected with Steven [name] on 05/20/18 at 10:00AM by telephone and verified that I am speaking with the correct person using two identifiers. Steven [name] is currently located at home  and no one is currently with her during visit. The provider, Mary-Margaret Hassell Done, [name] is located in their home office at time of visit.  I discussed the limitations, risks, security and privacy concerns of performing an evaluation and management service by telephone and the availability of in person appointments. I also discussed with the patient that there may be a patient responsible charge related to this service. The patient expressed understanding and agreed to proceed.   History and Present Illness:   Chief Complaint: medical management of chronic issues   HPI:  1. Essential hypertension No c/o chest pain, sob or headache. doesnot check blood pressure at home. BP Readings from Last 3 Encounters:  02/14/18 133/66  02/07/18 117/67  02/04/18 (!) 122/51     2. Chronic diastolic congestive heart failure ([place]) Saw cardiology in December 2019 and according to note no changes made to plan of care.  3. Pulmonary emphysema, unspecified emphysema type ([place]) No problems right now- he has been staying at home other then going to [employer] for breakfast.  4. Squamous cell carcinoma of lung, unspecified laterality ([place]) This was many years ago and has had no flare up.  5. Dementia without behavioral disturbance, unspecified dementia type ([place]) Gets confused at times. Has short term memory problems  6. Benign prostatic hyperplasia without lower urinary tract symptoms No problems voiding  7. CKD (chronic kidney disease), stage III (H[facility]) Last creatine was 1.46. will check labs when able  8. Hyperlipidemia with target LDL less than 100 Does not watch diet and does no exercise  9.  Anxiety state Says he is doing well right  Now.    Outpatient Encounter Medications as of 05/20/2018  Medication Sig  . albuterol (PROVENTIL) (2.5 MG/3ML) 0.083% nebulizer solution Take 3 mLs (2.5 mg total) by nebulization every 2 (two) hours as needed for wheezing or shortness of breath.  [name] aspirin 81 MG tablet Take 81 mg by mouth daily.   [name] atorvastatin (LIPITOR) 10 MG tablet TAKE (1) TABLET BY MOUTH ONCE DAILY.  . clonazePAM (KLONOPIN) 1 MG tablet TAKE (1) TABLET BY MOUTH THREE TIMES DAILY.  [name] diltiazem (CARDIZEM CD) 120 MG 24 hr capsule TAKE 1 CAPSULE BY MOUTH ONCE A DAY.  [name] doxazosin (CARDURA) 2 MG tablet TAKE (1) TABLET BY MOUTH ONCE DAILY.  [name] FERREX 150 150 MG capsule TAKE 1 CAPSULE BY MOUTH ONCE A DAY.  . furosemide (LASIX) 20 MG tablet TAKE 1 TABLET BY MOUTH EVERY OTHER DAY.  [name] ipratropium-albuterol (DUONEB) 0.5-2.5 (3) MG/3ML SOLN Take 3 mLs by nebulization 3 (three) times daily.      New complaints: None today  Social history:  Lives alone- family checks on him daily.       Review of Systems  Constitutional: Negative for diaphoresis and weight loss.  Eyes: Negative for blurred vision, double vision and pain.  Respiratory: Negative for shortness of breath.   Cardiovascular: Negative for chest pain, palpitations, orthopnea and leg swelling.  Gastrointestinal: Negative for abdominal pain.  Skin: Negative for rash.  Neurological: Negative for dizziness, sensory change, loss of consciousness, weakness and headaches.  Endo/Heme/Allergies: Negative for polydipsia. Does not bruise/bleed  easily.  Psychiatric/Behavioral: Negative for memory loss. The patient does not have insomnia.   All other systems reviewed and are negative.    Observations/Objective: Alert  And oriented- answers all questions appropriately No distress noted  Assessment and Plan: [name] comes in today with chief complaint of Medical Management of Chronic Issues   Diagnosis and orders  addressed:  1. Essential hypertension Low salt diet - furosemide (LASIX) 20 MG tablet; Take 1 tablet (20 mg total) by mouth every other day.  Dispense: 45 tablet; Refill: 1  2. Chronic diastolic congestive heart failure ([place]) Keep follow up with cardiology every 6 months - diltiazem (CARDIZEM CD) 120 MG 24 hr capsule; Take 1 capsule (120 mg total) by mouth daily.  Dispense: 90 capsule; Refill: 1  3. Pulmonary emphysema, unspecified emphysema type ([place]) Wear a mask when going out in [ublic  4. Squamous cell carcinoma of lung, unspecified laterality ([place])  5. Dementia without behavioral disturbance, unspecified dementia type ([place]) Watch news daily to stay oriented  6. Benign prostatic hyperplasia without lower urinary tract symptoms - doxazosin (CARDURA) 2 MG tablet; TAKE (1) TABLET BY MOUTH ONCE DAILY.  Dispense: 90 tablet; Refill: 1  7. CKD (chronic kidney disease), stage III ([place]) Will check labs when able  8. Hyperlipidemia with target LDL less than 100 Low fat diet - atorvastatin (LIPITOR) 10 MG tablet; TAKE (1) TABLET BY MOUTH ONCE DAILY.  Dispense: 90 tablet; Refill: 1  9. Anxiety state stress management - clonazePAM (KLONOPIN) 1 MG tablet; TAKE (1) TABLET BY MOUTH THREE TIMES DAILY.  Dispense: 90 tablet; Refill: 1   Previous labs reviewed Health Maintenance reviewed Diet and exercise encouraged  Follow up plan: 3 months     I discussed the assessment and treatment plan with the patient. The patient was provided an opportunity to ask questions and all were answered. The patient agreed with the plan and demonstrated an understanding of the instructions.   The patient was advised to call back or seek an in-person evaluation if the symptoms worsen or if the condition fails to improve as anticipated.  The above assessment and management plan was discussed with the patient. The patient verbalized understanding of and has agreed to the management plan. Patient is aware to  call the clinic if symptoms persist or worsen. Patient is aware when to return to the clinic for a follow-up visit. Patient educated on when it is appropriate to go to the emergency department.    I provided 15 minutes of non-face-to-face time during this encounter.    Mary-Margaret Hassell Done, [name]

## 2018-05-23 ENCOUNTER — Other Ambulatory Visit: Payer: Self-pay

## 2018-05-23 ENCOUNTER — Ambulatory Visit: Payer: Medicare HMO | Admitting: *Deleted

## 2018-05-23 DIAGNOSIS — F0390 Unspecified dementia without behavioral disturbance: Secondary | ICD-10-CM

## 2018-05-23 DIAGNOSIS — F411 Generalized anxiety disorder: Secondary | ICD-10-CM

## 2018-05-27 NOTE — Patient Instructions (Signed)
Visit Information  Goals Addressed            This Visit's Progress     Patient Stated   . "I want to make sure that my weight is ok" (pt-stated)       Nurse Case Manager Clinical Goal(s):   Over the next 30 days patient will state understanding of recommended nutritional intake  Over the next 30 days patient will weight weekly and report any weight loss of greater than 2 pounds in one week or 5 pounds in a month.   Interventions:   Discussed current eating habits  Typically has coffee and a biscuit from a fast food restaurant each morning  Prepares light meals. Son brings groceries regularly.  Patient Self Care Activities:  1. Patient able to prepare light meals independently            The patient verbalized understanding of instructions provided today and declined a print copy of patient instruction materials.   The CM team will reach out to the patient again over the next 30 days.    Chong Sicilian, RN-BC, BSN Nurse Case Manager [place] [phone]

## 2018-05-27 NOTE — Chronic Care Management (AMB) (Addendum)
  Chronic Care Management   RN Case Management  Telephone Note  05/23/2018 Name: Steven [name] MRN: [id number] DOB: 23-Apr-1928  [name] is an 85 year old male primary care patient of [name] who was referred for CCM services to help manage chronic medical conditions and coordinate care. I spoke with Mr Zink today by telephone. He has been speaking with Legrand Como "Scott" Forrest, LCSW regarding his psychosocial needs.   Goals Addressed    Patient Stated   . "I want to make sure that my weight is ok" (pt-stated)       Nurse Case Manager Clinical Goal(s):   Over the next 30 days patient will state understanding of recommended nutritional intake  Over the next 30 days patient will weight weekly and report any weight loss of greater than 2 pounds in one week or 5 pounds in a month.   Interventions:   Discussed current eating habits  Typically has coffee and a biscuit from a fast food restaurant each morning  Prepares light meals. Son brings groceries regularly.  Patient Self Care Activities:   Patient able to prepare light meals independently          Follow up plan: The CM team will reach out to the patient again over the next 30 days.    Chong Sicilian, RN-BC, BSN Nurse Case Manager [place] [phone]   "I have reviewed this encounter including the documentation in this note and/or discussed this patient with the nurse coordinator, Chong Sicilian, RN . I am certifying that I agree with the content of this note as supervising physician." [place], [name]

## 2018-06-09 ENCOUNTER — Ambulatory Visit (INDEPENDENT_AMBULATORY_CARE_PROVIDER_SITE_OTHER): Payer: Medicare HMO | Admitting: Licensed Clinical Social Worker

## 2018-06-09 ENCOUNTER — Other Ambulatory Visit: Payer: Self-pay | Admitting: Cardiovascular Disease

## 2018-06-09 DIAGNOSIS — J439 Emphysema, unspecified: Secondary | ICD-10-CM | POA: Diagnosis not present

## 2018-06-09 DIAGNOSIS — I5032 Chronic diastolic (congestive) heart failure: Secondary | ICD-10-CM

## 2018-06-09 DIAGNOSIS — F0391 Unspecified dementia with behavioral disturbance: Secondary | ICD-10-CM | POA: Diagnosis not present

## 2018-06-09 DIAGNOSIS — N183 Chronic kidney disease, stage 3 unspecified: Secondary | ICD-10-CM

## 2018-06-09 DIAGNOSIS — I6523 Occlusion and stenosis of bilateral carotid arteries: Secondary | ICD-10-CM

## 2018-06-09 DIAGNOSIS — F411 Generalized anxiety disorder: Secondary | ICD-10-CM

## 2018-06-09 DIAGNOSIS — I10 Essential (primary) hypertension: Secondary | ICD-10-CM

## 2018-06-09 NOTE — Patient Instructions (Signed)
Licensed Clinical Social Worker Visit Information  Goals we discussed today:  Goals Addressed            This Visit's Progress   . "I want to make sure I can keep control of my anxiety"  (pt-stated)       Clinical Social Work Clinical Goal(s): Over the next 30 days client and family caregiver will verbalize understanding of long term plan for management of anxiety with panic attacks.    Interventions: . Review with client of strategies to manage symptoms faced.  . Encouraged client to talk with [name], [name], as needed regarding effectiveness of medication prescribed for anxiety   .  Talked with client about social support network of client (he enjoys going to [employer] regularly for coffee and to visit with his friends ) .  Talked with client about support client receives from client's son  Patient Self Care Activities:  . Participates in morning breakfast gathering with friends. [name] with family. Sees son each Saturday.   .  Talks openly about his struggles with anxiety and medications taken for anxiety.   Plan:   Client to communicate with family members as needed to discuss ongoing client needs. Client to communicate with LCSW as needed to discuss psychosocial needs of client LCSW to call client in next 3 weeks to discuss client management of anxiety and panic symptoms Client to attend scheduled medical appointments Client to communicate with RN CM as needed to discuss nursing needs of client.      Materials Provided: No  Follow Up Plan: LCSW to call client in next 3 weeks to talk with client about client management of anxiety and panic symptoms faced by client.  The patient verbalized understanding of instructions provided today and declined a print copy of patient instruction materials.   [name].Cherylanne Ardelean MSW, LCSW Licensed Clinical Social Worker [facility] [phone]

## 2018-06-09 NOTE — Chronic Care Management (AMB) (Addendum)
  Care Management Note   Steven [name] is a 85 y.o. year old male who is a primary care patient of [name], [place]. The CM team was consulted for assistance with chronic disease management and care coordination.   I reached out to [place] Shrestha by phone today.   Review of patient status, including review of consultants reports, relevant laboratory and other test results, and collaboration with appropriate care team members and the patient's provider was performed as part of comprehensive patient evaluation and provision of chronic care management services.   Social Determinants of Health:Risk for Depression; Risk for Stress; Risk for tobacco exposure    Chronic Care Management from 04/08/2018 in [facility]  PHQ-9 Total Score  5     Goals Addressed            This Visit's Progress   . "I want to make sure I can keep control of my anxiety"  (pt-stated)       Clinical Social Work Clinical Goal(s): Over the next 30 days client and family caregiver will verbalize understanding of long term plan for management of anxiety with panic attacks.    Interventions: . Review with client of strategies to manage symptoms faced.  . Encouraged client to talk with [name], [name], as needed regarding effectiveness of medication prescribed for anxiety  . Talked with client about social support network of client (enjoys going to [employer] regularly for coffee and visits with friends)  . Talked with client about client's support from his son  Patient Self Care Activities:  . Participates in morning breakfast gathering with friends. [name] with family. Sees son each Saturday.   .  Talks openly about his struggles with anxiety and medications taken for anxiety.   Plan:   Client to communicate with family members as needed to discuss ongoing client needs. Client to communicate with LCSW as needed to discuss psychosocial needs of client LCSW to call client  in next 3 weeks to discuss client management of anxiety and panic symptoms Client to attend scheduled medical appointments Client to communicate with RN CM as needed to discuss nursing needs of client.     Client said he thinks his medications are working well. He said he thinks Klonipin is very helpful to him in managing his symptoms  Client drives his car to complete errands or go to get food as needed. He said he is eating well and is sleeping well. He has his prescribed medications and is taking medications as prescribed.Client has support from his son.   Follow Up Plan: LCSW to call client in next 3 weeks to discuss client management of anxiety and panic symptoms faced  [name].Kean Gautreau MSW, LCSW Licensed Clinical Social Worker [facility]  "I have reviewed this encounter including the documentation in this note and/or discussed this patient with [name] . I am certifying that I agree with the content of this note as supervising physician." [place], [place]

## 2018-06-27 ENCOUNTER — Ambulatory Visit: Payer: Self-pay | Admitting: Licensed Clinical Social Worker

## 2018-06-27 DIAGNOSIS — I10 Essential (primary) hypertension: Secondary | ICD-10-CM

## 2018-06-27 DIAGNOSIS — I5033 Acute on chronic diastolic (congestive) heart failure: Secondary | ICD-10-CM

## 2018-06-27 DIAGNOSIS — F411 Generalized anxiety disorder: Secondary | ICD-10-CM

## 2018-06-27 DIAGNOSIS — F0391 Unspecified dementia with behavioral disturbance: Secondary | ICD-10-CM

## 2018-06-27 DIAGNOSIS — J439 Emphysema, unspecified: Secondary | ICD-10-CM

## 2018-06-27 DIAGNOSIS — N183 Chronic kidney disease, stage 3 unspecified: Secondary | ICD-10-CM

## 2018-06-27 NOTE — Chronic Care Management (AMB) (Addendum)
  Care Management Note   Steven [name] is a 85 y.o. year old male who is a primary care patient of [name], [place]. The CM team was consulted for assistance with chronic disease management and care coordination.   I reached out to Steven [name] by phone today.   Review of patient status, including review of consultants reports, relevant laboratory and other test results, and collaboration with appropriate care team members and the patient's provider was performed as part of comprehensive patient evaluation and provision of chronic care management services.   Social Determinants of Health:Risk for Depression; risk for tobacco exposure; risk for Stress    Chronic Care Management from 04/08/2018 in [place]  PHQ-9 Total Score  5     Goals Addressed            This Visit's Progress   . "I want to make sure I can keep control of my anxiety"  (pt-stated)       Clinical Social Work Clinical Goal(s): Over the next 30 days client and family caregiver will verbalize understanding of long term plan for management of anxiety with panic attacks.    Interventions: . Review with client of strategies to manage symptoms faced.  . Encouraged client to talk with [name], [name], as needed regarding effectiveness of medication prescribed for anxiety  . Talked with client about social support network of client . Encouraged client to talk with [facility] regarding nursing needs of client  Patient Self Care Activities:  . Participates in morning breakfast gathering with friends. Steven [name] with family. Sees son each Saturday.   .  Talks openly about his struggles with anxiety and medications taken for anxiety.   Plan:   Client to communicate with family members as needed to discuss ongoing client needs. Client to communicate with LCSW as needed to discuss psychosocial needs of client LCSW to call client in next 3 weeks to discuss client management of anxiety  and panic symptoms Client to attend scheduled medical appointments Client to communicate with RN CM as needed to discuss nursing needs of client. Client to socialize with family or friends as a way to manage anxiety or stress symptoms faced.      Client said he thinks his medications are working well. He said he thinks Klonipin is very helpful to him in managing his symptoms .  Client drives his car to complete errands or go to get food as needed. He said he is eating well and is sleeping well. He has his prescribed medications and is taking medications as prescribed.Client has support from his son. Client said he has pain in his legs. Client said he has pain in his legs while walking . Client said that when he sits down , his legs do not hurt.  Client does not use any ambulation equipment.  Client said his legs hurt from his knees down.  Client said he has had this pain in his legs for about 2 weeks.    Follow Up Plan: LCSW to call client in next 3 weeks to discuss client management of anxiety and panic symptoms.  Steven [name].Steven [name] MSW, LCSW Licensed Clinical Social Worker [facility] [phone]  "I have reviewed this encounter including the documentation in this note and/or discussed this patient with Steven [name] . I am certifying that I agree with the content of this note as supervising physician." [place], [name]

## 2018-06-27 NOTE — Patient Instructions (Signed)
Licensed Clinical Social Worker Visit Information  Goals we discussed today:  Goals Addressed            This Visit's Progress   . "I want to make sure I can keep control of my anxiety"  (pt-stated)       Clinical Social Work Clinical Goal(s): Over the next 30 days client and family caregiver will verbalize understanding of long term plan for management of anxiety with panic attacks.    Interventions: . Review with client of strategies to manage symptoms faced.  . Encouraged client to talk with [name], [name], as needed regarding effectiveness of medication prescribed for anxiety  . Talked with client about social support network of client . Encouraged client to talk with [facility] regarding nursing needs of client  Patient Self Care Activities:  . Participates in morning breakfast gathering with friends. [name] with family. Sees son each Saturday.   .  Talks openly about his struggles with anxiety and medications taken for anxiety.   Plan:   Client to communicate with family members as needed to discuss ongoing client needs. Client to communicate with LCSW as needed to discuss psychosocial needs of client LCSW to call client in next 3 weeks to discuss client management of anxiety and panic symptoms Client to attend scheduled medical appointments Client to communicate with RN CM as needed to discuss nursing needs of client. Client to socialize with family or friends as a way to manage anxiety or stress symptoms      Materials Provided: No  Follow Up Plan: LCSW to call client in next 3 weeks to discuss client management of anxiety and panic symptoms  The patient verbalized understanding of instructions provided today and declined a print copy of patient instruction materials.   Steven [name].Leyah Bocchino MSW, LCSW Licensed Clinical Social Worker [facility] [phone]

## 2018-07-07 ENCOUNTER — Other Ambulatory Visit: Payer: Self-pay | Admitting: Nurse Practitioner

## 2018-07-07 DIAGNOSIS — F411 Generalized anxiety disorder: Secondary | ICD-10-CM

## 2018-07-18 ENCOUNTER — Ambulatory Visit (INDEPENDENT_AMBULATORY_CARE_PROVIDER_SITE_OTHER): Payer: Medicare HMO | Admitting: Licensed Clinical Social Worker

## 2018-07-18 DIAGNOSIS — I10 Essential (primary) hypertension: Secondary | ICD-10-CM

## 2018-07-18 DIAGNOSIS — I5032 Chronic diastolic (congestive) heart failure: Secondary | ICD-10-CM | POA: Diagnosis not present

## 2018-07-18 DIAGNOSIS — F0390 Unspecified dementia without behavioral disturbance: Secondary | ICD-10-CM | POA: Diagnosis not present

## 2018-07-18 DIAGNOSIS — N183 Chronic kidney disease, stage 3 unspecified: Secondary | ICD-10-CM

## 2018-07-18 DIAGNOSIS — F411 Generalized anxiety disorder: Secondary | ICD-10-CM

## 2018-07-18 DIAGNOSIS — J439 Emphysema, unspecified: Secondary | ICD-10-CM | POA: Diagnosis not present

## 2018-07-18 NOTE — Chronic Care Management (AMB) (Addendum)
  Chronic Care Management    Clinical Social Work CCM Outreach Note  07/18/2018 Name: [name] MRN: [id number] DOB: 26-May-1928  Steven [name] is a 85 y.o. year old male who is a primary care patient of [name], [place] . The CCM team was consulted for assistance with assessment of psychosocial needs.   LCSW reached out to [place] today by phone.   Social Determinants of Health: Risk for Social Isolation; Risk for Depression; risk for Stress; risk for tobacco exposure    Chronic Care Management from 04/08/2018 in [place]  PHQ-9 Total Score  5     Patient Stated Goal:  I want to make sure I can keep control of my anxiety.  Clinical Social Work Clinical Goal(s): Over the next 30 days client and family caregiver will verbalize understanding of long term plan for management of anxiety with panic attacks.    Interventions: . Review with client of strategies to manage symptoms faced.  . Talked with client about social support network of client (son, friends)  .  Encouraged client to talk with [facility] regarding nursing needs of client  . Encouraged client to use relaxation techniques to help manage symptoms faced, (socializing with friends, taking short walks, visiting with family, watching TV)  Patient Self Care Activities:  . Participates in morning breakfast gathering with friends. [name] with family. Sees son each Saturday.   .  Talks openly about his struggles with anxiety and medications taken for anxiety.   Plan:   Client to communicate with family members as needed to discuss ongoing client needs. Client to communicate with LCSW as needed to discuss psychosocial needs of client LCSW to call client in next 3 weeks to discuss client management of anxiety and panic symptoms Client to attend scheduled medical appointments Client to communicate with RN CM as needed to discuss nursing needs of client. Client to use relaxation techniques to  help manage symptoms faced   Client said he thinks his medications are working well. 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 He said he is eating well and is sleeping well. He has his prescribed medications and is taking medications as prescribed.Client has support from his son. Client said he sees his friends regularly for morning breakfast. Client said he has periodic pain in his knees and it is hard for him to walk more than a short distance. He said he is appreciative of medical support of [name], [name] at [facility]. He said he has support from his family and his son ensures he has prescribed medications  Follow Up Plan: LCSW to call client in next 3 weeks to discuss client management of anxiety and panic symptoms.  [name].Treshawn Allen MSW, LCSW Licensed Clinical Social Worker [facility] [phone]  "I have reviewed this encounter including the documentation in this note and/or discussed this patient with [name] . I am certifying that I agree with the content of this note as supervising physician." [place], [name]

## 2018-07-18 NOTE — Patient Instructions (Addendum)
Licensed Clinical Social Worker Visit Information    Materials Provided: No   Patient Stated Goal:  I want to make sure I can keep control of my anxiety.  Clinical Social Work Clinical Goal(s): Over the next 30 days client and family caregiver will verbalize understanding of long term plan for management of anxiety with panic attacks.    Interventions:  Review with client of strategies to manage symptoms faced.   Talked with client about social support network of client (son, friends)    Encouraged client to talk with RNCM regarding nursing needs of client   Encouraged client to use relaxation techniques to help manage symptoms faced, (socializing with friends, taking short walks, visiting with family, watching TV)  Patient Self Care Activities:   Participates in morning breakfast gathering with friends.  Socializes with family. Sees son each Saturday.     Talks openly about his struggles with anxiety and medications taken for anxiety.   Plan:   Client to communicate with family members as needed to discuss ongoing client needs. Client to communicate with LCSW as needed to discuss psychosocial needs of client LCSW to call client in next 3 weeks to discuss client management of anxiety and panic symptoms Client to attend scheduled medical appointments Client to communicate with RN CM as needed to discuss nursing needs of client. Client to use relaxation techniques to help manage symptoms faced   Client said he thinks his medications are working well. 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 He said he is eating well and is sleeping well. He has his prescribed medications and is taking medications as prescribed.Client has support from his son.Client said he sees his friends regularly for morning breakfast. Client said he has periodic pain in his knees and it is hard for him  to walk more than a short distance. He said he is appreciative of medical support of [name], [name] at [facility]. He said he has support from his family and his son ensures he has prescribed medications  Follow Up Plan: LCSW to call client in next 3 weeks to discuss client management of anxiety and panic symptoms.     Patient verbalized understanding of patient instructions and declined a print copy of patient instructions   [name].Suellen Durocher MSW, LCSW Licensed Clinical Social Worker [facility] [phone]

## 2018-07-23 ENCOUNTER — Other Ambulatory Visit: Payer: Self-pay | Admitting: Nurse Practitioner

## 2018-07-23 DIAGNOSIS — F411 Generalized anxiety disorder: Secondary | ICD-10-CM

## 2018-08-05 ENCOUNTER — Ambulatory Visit: Payer: Medicare HMO | Admitting: *Deleted

## 2018-08-05 DIAGNOSIS — F0390 Unspecified dementia without behavioral disturbance: Secondary | ICD-10-CM

## 2018-08-05 DIAGNOSIS — M79604 Pain in right leg: Secondary | ICD-10-CM

## 2018-08-05 DIAGNOSIS — F411 Generalized anxiety disorder: Secondary | ICD-10-CM

## 2018-08-05 NOTE — Patient Instructions (Signed)
Visit Information  Goals Addressed            This Visit's Progress     Patient Stated   . "I want to find out why my legs hurt when I walk" (pt-stated)       Current Barriers:  [name] Knowledge Deficits related to cause of bilateral LE leg pain during ambulation . Cognitive Deficits  Nurse Case Manager Clinical Goal(s):  [name] Over the next 30 days, patient will verbalize understanding of plan for bilateral lower leg pain (suspected claudication) . Over the next 30 days, patient will work with PCP, [name] to address needs related to lower leg pain.  Interventions:  . Discussed history of present illness with patient o Bilateral lower leg pain for approximately 1 month o Pain is only present with ambulation and resolves during rest . Advised patient to schedule an appointment with PCP to discuss symptoms and potential treatment . Discussed plans with patient for ongoing care management follow up and provided patient with direct contact information for care management team  Patient Self Care Activities:  . Performs ADL's independently . Has help as needed from his son  Initial goal documentation     . "I want to make sure that my weight is ok" (pt-stated)       Nurse Case Manager Clinical Goal(s):   Over the next 60 days patient will eat 3 planned meals a day.  Over the next 60 days patient will weight himself once a week and report any weight loss greater than 2 lbs per week by calling [phone]  Interventions:   Discussed current eating habits  Typically has coffee and a biscuit from a fast food restaurant each morning  Prepares light meals. Son brings groceries regularly.  No problems with access to food  Patient Self Care Activities:  1. Patient able to prepare light meals independently            The patient verbalized understanding of instructions provided today and declined a print copy of patient instruction materials.   The care  management team will reach out to the patient again over the next 30 days.    Chong Sicilian, RN-BC, BSN Nurse Care Manager [facility] [phone]

## 2018-08-05 NOTE — Chronic Care Management (AMB) (Addendum)
  Chronic Care Management   Follow Up Note   08/05/2018 Name: JAMASON PECKHAM MRN: [id number] DOB: 23-Nov-1928  Referred by: [name], [name] Reason for referral : Chronic Care Management (RNCM follow up)   JOHNELL BAS is a 85 y.o. year old male who is a primary care patient of [name], [place]. The CCM team was consulted for assistance with chronic disease management and care coordination needs.    Review of patient status, including review of consultants reports, relevant laboratory and other test results, and collaboration with appropriate care team members and the patient's provider was performed as part of comprehensive patient evaluation and provision of chronic care management services.    I spoke with Mr Kaeser by telephone today. He reported that he was doing well overall. He said that he didn't like to brag, but that most of his brothers and sisters died before the age of 63, so he felt that he was doing pretty good. He continues to have coffee each morning at [employer] with his friends and he has a sausage biscuit as well most days.   Goals Addressed      Patient Stated   . "I want to find out why my legs hurt when I walk" (pt-stated)       Current Barriers:  [name] Knowledge Deficits related to cause of bilateral LE leg pain during ambulation . Cognitive Deficits  Nurse Case Manager Clinical Goal(s):  [name] Over the next 30 days, patient will verbalize understanding of plan for bilateral lower leg pain (suspected claudication) . Over the next 30 days, patient will work with PCP, [name] to address needs related to lower leg pain.  Interventions:  . Discussed history of present illness with patient o Bilateral lower leg pain for approximately 1 month o Pain is only present with ambulation and resolves during rest  Advised patient to schedule an appointment with PCP to discuss symptoms and potential treatment. Offered to make appt for him, but he  preferred to wait. . Discussed plans with patient for ongoing care management follow up and provided patient with direct contact information for care management team  Patient Self Care Activities:  . Performs ADL's independently . Has help as needed from his son  Initial goal documentation     . "I want to make sure that my weight is ok" (pt-stated)       Nurse Case Manager Clinical Goal(s):   Over the next 60 days patient will eat 3 planned meals a day.  Over the next 60 days patient will weight himself once a week and report any weight loss greater than 2 lbs per week by calling [phone]  Interventions:   Discussed current eating habits  Typically has coffee and a biscuit from a fast food restaurant each morning  Prepares light meals. Son brings groceries regularly.  No problems with access to food  Patient Self Care Activities:  1. Patient able to prepare light meals independently       Follow up Plan The care management team will reach out to the patient again over the next 30 days.    Chong Sicilian, RN-BC, BSN Nurse Care Manager [facility] [phone]    "I have reviewed this encounter including the documentation in this note and/or discussed this patient with the nurse coordinator, Chong Sicilian, RN . I am certifying that I agree with the content of this note as supervising physician." [place], [name]

## 2018-08-06 ENCOUNTER — Telehealth: Payer: Self-pay

## 2018-08-11 ENCOUNTER — Ambulatory Visit (INDEPENDENT_AMBULATORY_CARE_PROVIDER_SITE_OTHER): Payer: Medicare HMO | Admitting: Licensed Clinical Social Worker

## 2018-08-11 DIAGNOSIS — I5032 Chronic diastolic (congestive) heart failure: Secondary | ICD-10-CM

## 2018-08-11 DIAGNOSIS — F411 Generalized anxiety disorder: Secondary | ICD-10-CM

## 2018-08-11 DIAGNOSIS — J439 Emphysema, unspecified: Secondary | ICD-10-CM | POA: Diagnosis not present

## 2018-08-11 DIAGNOSIS — F0390 Unspecified dementia without behavioral disturbance: Secondary | ICD-10-CM

## 2018-08-11 DIAGNOSIS — I10 Essential (primary) hypertension: Secondary | ICD-10-CM | POA: Diagnosis not present

## 2018-08-11 DIAGNOSIS — N183 Chronic kidney disease, stage 3 unspecified: Secondary | ICD-10-CM

## 2018-08-11 NOTE — Patient Instructions (Signed)
Licensed Clinical Social Worker Visit Information  Goals we discussed today:  Goals Addressed            This Visit's Progress   . "I want to make sure I can keep control of my anxiety"  (pt-stated)       Clinical Social Work Clinical Goal(s): Over the next 30 days client and family caregiver will verbalize understanding of long term plan for management of anxiety with panic attacks.    Interventions: . Review with client of strategies to manage symptoms faced.  . Talked with client about social support network of client (friends, son) . Encouraged client to talk with [facility] regarding nursing needs of client  Patient Self Care Activities:  . Participates in morning breakfast gathering with friends. [name] with family. Sees son each Saturday.   .  Talks openly about his struggles with anxiety and medications taken for anxiety.   Plan:   Client to communicate with family members as needed to discuss ongoing client needs. Client to communicate with LCSW as needed to discuss psychosocial needs of client LCSW to call client in next 3 weeks to discuss client management of anxiety and panic symptoms Client to attend scheduled medical appointments Client to communicate with RN CM as needed to discuss nursing needs of client. Client to socialize with family or friends as a way to manage anxiety or stress symptoms       Materials Provided: No  Follow Up Plan: LCSW to call client in next 3 weeks to discuss client management of anxiety and panic symptoms of client   The patient verbalized understanding of instructions provided today and declined a print copy of patient instruction materials.   [name].Azaryah Heathcock MSW, LCSW Licensed Clinical Social Worker [facility] [phone]

## 2018-08-11 NOTE — Chronic Care Management (AMB) (Addendum)
Care Management Note   Steven [name] is a 85 y.o. year old male who is a primary care patient of [name], [place]. The CM team was consulted for assistance with chronic disease management and care coordination.   I reached out to Bud Face Novitski by phone today.   Review of patient status, including review of consultants reports, relevant laboratory and other test results, and collaboration with appropriate care team members and the patient's provider was performed as part of comprehensive patient evaluation and provision of chronic care management services.   Social Determinants of Health:Risk for tobacco use; risk for Depression; risk for Stress     Chronic Care Management from 04/08/2018 in [place]  PHQ-9 Total Score  5     GAD 7 : Generalized Anxiety Score 05/08/2016  Nervous, Anxious, on Edge 1  Control/stop worrying 1  Worry too much - different things 0  Trouble relaxing 1  Restless 1  Easily annoyed or irritable 0  Afraid - awful might happen 0  Total GAD 7 Score 4  Anxiety Difficulty Not difficult at all    Goals Addressed            This Visit's Progress   . "I want to make sure I can keep control of my anxiety"  (pt-stated)       Clinical Social Work Clinical Goal(s): Over the next 30 days client and family caregiver will verbalize understanding of long term plan for management of anxiety with panic attacks.    Interventions: . Review with client of strategies to manage symptoms faced.  . Talked with client about social support network of client (friends, son of client) . Encouraged client to talk with [facility] regarding nursing needs of client  Patient Self Care Activities:  . Participates in morning breakfast gathering with friends. [name] with family. Sees son each Saturday.   .  Talks openly about his struggles with anxiety and medications taken for anxiety.   Plan:   Client to communicate with family members as needed to  discuss ongoing client needs. Client to communicate with LCSW as needed to discuss psychosocial needs of client LCSW to call client in next 3 weeks to discuss client management of anxiety and panic symptoms Client to attend scheduled medical appointments Client to communicate with RN CM as needed to discuss nursing needs of client. Client to socialize with family or friends as a way to manage anxiety or stress symptoms      Client said he has his prescribed medications and is taking medications as prescribed.He likes to have breakfast most mornings with his friends at [employer]. 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 He said he has some pain in his lower extremities when he walks. LCSW encouraged client  to continue talking with [facility] regarding nursing needs of client. He said he watches TV to relax and he said he sometimes plays guitar to relax. He talked of support he receives regularly from his son. LCSW encouraged client to call LCSW as needed to discuss social work needs of client  Follow Up Plan: LCSW to call client in next 3 weeks to discuss client management of anxiety and panic symptoms  [name].Rosalie Gelpi MSW, LCSW Licensed Clinical Social Worker [facility] Care [facility] [phone]  "I have reviewed this encounter including the documentation in this note and/or discussed this patient with [name] . I am certifying that I agree with the content  of this note as supervising physician." Mary-Margaret Hassell Done, [name]

## 2018-09-01 ENCOUNTER — Ambulatory Visit: Payer: Self-pay | Admitting: Licensed Clinical Social Worker

## 2018-09-01 DIAGNOSIS — J439 Emphysema, unspecified: Secondary | ICD-10-CM

## 2018-09-01 DIAGNOSIS — F411 Generalized anxiety disorder: Secondary | ICD-10-CM

## 2018-09-01 DIAGNOSIS — F0390 Unspecified dementia without behavioral disturbance: Secondary | ICD-10-CM

## 2018-09-01 DIAGNOSIS — I5032 Chronic diastolic (congestive) heart failure: Secondary | ICD-10-CM

## 2018-09-01 DIAGNOSIS — N183 Chronic kidney disease, stage 3 unspecified: Secondary | ICD-10-CM

## 2018-09-01 DIAGNOSIS — I10 Essential (primary) hypertension: Secondary | ICD-10-CM

## 2018-09-01 NOTE — Chronic Care Management (AMB) (Signed)
Care Management Note   Steven [name] is a 85 y.o. year old male who is a primary care patient of [name], [place]. The CM team was consulted for assistance with chronic disease management and care coordination.   I reached out to Bud Face Saccente by phone today.   Review of patient status, including review of consultants reports, relevant laboratory and other test results, and collaboration with appropriate care team members and the patient's provider was performed as part of comprehensive patient evaluation and provision of chronic care management services.   Social Determinants of Health: risk for tobacco use; risk for Depression; risk for Stress    Chronic Care Management from 04/08/2018 in [place]  PHQ-9 Total Score  5     GAD 7 : Generalized Anxiety Score 05/08/2016  Nervous, Anxious, on Edge 1  Control/stop worrying 1  Worry too much - different things 0  Trouble relaxing 1  Restless 1  Easily annoyed or irritable 0  Afraid - awful might happen 0  Total GAD 7 Score 4  Anxiety Difficulty Not difficult at all   Goals        . "I want to make sure I can keep control of my anxiety"  (pt-stated)     Clinical Social Work Clinical Goal(s): Over the next 30 days client and family caregiver will verbalize understanding of long term plan for management of anxiety with panic attacks.    Interventions: . Review with client of strategies to manage symptoms faced.  . Talked with client about social support network of client . Encouraged client to talk with [facility] regarding nursing needs of client  Patient Self Care Activities:  . Participates in morning breakfast gathering with friends. [name] with family. Sees son each Saturday.   .  Talks openly about his struggles with anxiety and medications taken for anxiety.   Plan:   Client to communicate with family members as needed to discuss ongoing client needs. Client to communicate with LCSW as  needed to discuss psychosocial needs of client LCSW to call client in next 3 weeks to discuss client management of anxiety and panic symptoms Client to attend scheduled medical appointments Client to communicate with RN CM as needed to discuss nursing needs of client. Client to socialize with family or friends as a way to manage anxiety or stress symptoms          . "I want to talk with my son about advanced directive" (pt-stated)     Current Barriers:  [name] Mental Health Concerns   Clinical Social Work Clinical Goal(s): Over the next 30 days, client will work with LCSW to address concerns related to completion of HCPOA  Interventions: . LCSW spoke with client about completion of HCPOA . LCSW previously mailed, at client request, Advanced Directives booklet to son of client . LCSW encouraged client to talk with his son about completion of HCPOA  Patient Self Care Activities:  . Client talks with his son regarding medical decisions for client.  Plan:  . LCSW to call client in 3 weeks to discuss HCPOA completion with client.  . Client to call LCSW as needed to discuss psychosocial needs of client.  . Client to communicate with his son regarding completion of HCPOA for client  *initial goal documentation   Client said he has his prescribed medications and is taking medications as prescribed.  He receives support from his son.  He said he has occasional leg pain.  LCSW and client spoke of completion of HCPOA for client.  LCSW informed client that LCSW had mailed HCPOA materials to his son several months ago. LCSW encouraged Engelbert to talk , as needed , with his son regarding HCPOA completion for client. He said he is eating well.He said he is sleeping well.   Follow Up Plan:  LCSW to call client in next 3 weeks to talk with client about management of anxiety and panic symptoms.  [name].Shakil Dirk MSW, LCSW Licensed Clinical Social Worker [facility] Care  Management [phone]

## 2018-09-01 NOTE — Patient Instructions (Addendum)
Licensed Clinical Social Worker Visit Information  Goals we discussed today:         . "I want to make sure I can keep control of my anxiety"  (pt-stated)     Clinical Social Work Clinical Goal(s): Over the next 30 days client and family caregiver will verbalize understanding of long term plan for management of anxiety with panic attacks.    Interventions: . Review with client of strategies to manage symptoms faced.  . Talked with client about social support network of client . Encouraged client to talk with [facility] regarding nursing needs of client  Patient Self Care Activities:  . Participates in morning breakfast gathering with friends. [name] with family. Sees son each Saturday.   .  Talks openly about his struggles with anxiety and medications taken for anxiety.    Plan:   Client to communicate with family members as needed to discuss ongoing client needs. Client to communicate with LCSW as needed to discuss psychosocial needs of client LCSW to call client in next 3 weeks to discuss client management of anxiety and panic symptoms Client to attend scheduled medical appointments Client to communicate with RN CM as needed to discuss nursing needs of client. Client to socialize with family or friends as a way to manage anxiety or stress symptoms          . "I want to talk with my son about advanced directive" (pt-stated)     Current Barriers:  [name] Mental Health Concerns   Clinical Social Work Clinical Goal(s): Over the next 30 days, client will work with LCSW to address concerns related to completion of HCPOA  Interventions: . LCSW spoke with client about completion of HCPOA . LCSW previously mailed, at client request, Advanced Directives booklet to son of client . LCSW encouraged client to talk with his son about completion of HCPOA  Patient Self Care Activities:  . Client talks with his son regarding medical decisions for client.  Plan:  . LCSW to call client in 3  weeks to discuss HCPOA completion with client.  . Client to call LCSW as needed to discuss psychosocial needs of client.  . Client to communicate with his son regarding completion of HCPOA for client  *initial goal documentation      Materials Provided: No  Follow Up Plan: LCSW to call client in next 3 weeks to talk with client about management  of anxiety and panic symptoms experienced by client  The patient verbalized understanding of instructions provided today and declined a print copy of patient instruction materials.   [name].Azaya Goedde MSW, LCSW Licensed Clinical Social Worker [facility] [phone]

## 2018-09-10 ENCOUNTER — Other Ambulatory Visit: Payer: Self-pay

## 2018-09-10 ENCOUNTER — Encounter: Payer: Self-pay | Admitting: Family Medicine

## 2018-09-10 ENCOUNTER — Ambulatory Visit (INDEPENDENT_AMBULATORY_CARE_PROVIDER_SITE_OTHER): Payer: Medicare HMO | Admitting: Family Medicine

## 2018-09-10 VITALS — BP 139/64 | HR 85 | Temp 98.9°F | Ht 67.0 in | Wt 128.8 lb

## 2018-09-10 DIAGNOSIS — H6123 Impacted cerumen, bilateral: Secondary | ICD-10-CM

## 2018-09-10 NOTE — Patient Instructions (Signed)
Debrox wax removal kit over the counter if needed.   Earwax Buildup, Adult The ears produce a substance called earwax that helps keep bacteria out of the ear and protects the skin in the ear canal. Occasionally, earwax can build up in the ear and cause discomfort or hearing loss. What increases the risk? This condition is more likely to develop in people who:  Are male.  Are elderly.  Naturally produce more earwax.  Clean their ears often with cotton swabs.  Use earplugs often.  Use in-ear headphones often.  Wear hearing aids.  Have narrow ear canals.  Have earwax that is overly thick or sticky.  Have eczema.  Are dehydrated.  Have excess hair in the ear canal. What are the signs or symptoms? Symptoms of this condition include:  Reduced or muffled hearing.  A feeling of fullness in the ear or feeling that the ear is plugged.  Fluid coming from the ear.  Ear pain.  Ear itch.  Ringing in the ear.  Coughing.  An obvious piece of earwax that can be seen inside the ear canal. How is this diagnosed? This condition may be diagnosed based on:  Your symptoms.  Your medical history.  An ear exam. During the exam, your health care provider will look into your ear with an instrument called an otoscope. You may have tests, including a hearing test. How is this treated? This condition may be treated by:  Using ear drops to soften the earwax.  Having the earwax removed by a health care provider. The health care provider may: ? Flush the ear with water. ? Use an instrument that has a loop on the end (curette). ? Use a suction device.  Surgery to remove the wax buildup. This may be done in severe cases. Follow these instructions at home:   Take over-the-counter and prescription medicines only as told by your health care provider.  Do not put any objects, including cotton swabs, into your ear. You can clean the opening of your ear canal with a washcloth or  facial tissue.  Follow instructions from your health care provider about cleaning your ears. Do not over-clean your ears.  Drink enough fluid to keep your urine clear or pale yellow. This will help to thin the earwax.  Keep all follow-up visits as told by your health care provider. If earwax builds up in your ears often or if you use hearing aids, consider seeing your health care provider for routine, preventive ear cleanings. Ask your health care provider how often you should schedule your cleanings.  If you have hearing aids, clean them according to instructions from the manufacturer and your health care provider. Contact a health care provider if:  You have ear pain.  You develop a fever.  You have blood, pus, or other fluid coming from your ear.  You have hearing loss.  You have ringing in your ears that does not go away.  Your symptoms do not improve with treatment.  You feel like the room is spinning (vertigo). Summary  Earwax can build up in the ear and cause discomfort or hearing loss.  The most common symptoms of this condition include reduced or muffled hearing and a feeling of fullness in the ear or feeling that the ear is plugged.  This condition may be diagnosed based on your symptoms, your medical history, and an ear exam.  This condition may be treated by using ear drops to soften the earwax or by having the earwax  removed by a health care provider.  Do not put any objects, including cotton swabs, into your ear. You can clean the opening of your ear canal with a washcloth or facial tissue. This information is not intended to replace advice given to you by your health care provider. Make sure you discuss any questions you have with your health care provider. Document Released: 03/01/2004 Document Revised: 01/04/2017 Document Reviewed: 04/04/2016 Elsevier Patient Education  2020 [employer].

## 2018-09-10 NOTE — Progress Notes (Signed)
Assessment & Plan:  1. Excessive cerumen in both ear canals - Ear lavage successful. Patient reports immediate increase in hearing. Education provided on excessive cerumen.  - Ear wax removal   Follow up plan: Return if symptoms worsen or fail to improve.  Hendricks Limes, MSN, APRN, [name]-C [facility]  Subjective:   Patient ID: Steven [name], male    DOB: [birth year], 85 y.o.   MRN: [id number]  HPI: Steven [name] is a 85 y.o. male presenting on 09/10/2018 for Cerumen Impaction  Patient comes in today requested that we flush his ears due to excessive ear wax. He reports he has had a gradual reduction in hearing (especially the right side) and knows this is why. He has had his ears flushed once before, years ago.   ROS: Negative unless specifically indicated above in HPI.   Relevant past medical history reviewed and updated as indicated.   Allergies and medications reviewed and updated.   Current Outpatient Medications:  .  albuterol (PROVENTIL) (2.5 MG/3ML) 0.083% nebulizer solution, Take 3 mLs (2.5 mg total) by nebulization every 2 (two) hours as needed for wheezing or shortness of breath., Disp: 75 mL, Rfl: 12 .  aspirin 81 MG tablet, Take 81 mg by mouth daily. , Disp: , Rfl:  .  atorvastatin (LIPITOR) 10 MG tablet, TAKE (1) TABLET BY MOUTH ONCE DAILY., Disp: 90 tablet, Rfl: 1 .  clonazePAM (KLONOPIN) 1 MG tablet, TAKE (1) TABLET BY MOUTH THREE TIMES DAILY., Disp: 84 tablet, Rfl: 4 .  diltiazem (CARDIZEM CD) 120 MG 24 hr capsule, Take 1 capsule (120 mg total) by mouth daily., Disp: 90 capsule, Rfl: 1 .  doxazosin (CARDURA) 2 MG tablet, TAKE (1) TABLET BY MOUTH ONCE DAILY., Disp: 90 tablet, Rfl: 1 .  furosemide (LASIX) 20 MG tablet, Take 1 tablet (20 mg total) by mouth every other day., Disp: 45 tablet, Rfl: 1 .  ipratropium-albuterol (DUONEB) 0.5-2.5 (3) MG/3ML SOLN, Take 3 mLs by nebulization 3 (three) times daily., Disp: 360 mL, Rfl:  .  iron polysaccharides  (FERREX 150) 150 MG capsule, Take 1 capsule (150 mg total) by mouth daily., Disp: 90 capsule, Rfl: 1  No Known Allergies  Objective:   BP 139/64   Pulse 85   Temp 98.9 F (37.2 C) (Temporal)   Ht 5\' 7"  (1.702 m)   Wt 128 lb 12.8 oz (58.4 kg)   BMI 20.17 kg/m    Physical Exam Vitals signs reviewed.  Constitutional:      General: He is not in acute distress.    Appearance: Normal appearance. He is normal weight. He is not ill-appearing, toxic-appearing or diaphoretic.  HENT:     Head: Normocephalic and atraumatic.     Right Ear: Tympanic membrane, ear canal and external ear normal. There is impacted cerumen (none after removal).     Left Ear: Tympanic membrane, ear canal and external ear normal. There is impacted cerumen (none after removal).  Eyes:     General: No scleral icterus.       Right eye: No discharge.        Left eye: No discharge.     Conjunctiva/sclera: Conjunctivae normal.  Neck:     Musculoskeletal: Normal range of motion.  Cardiovascular:     Rate and Rhythm: Normal rate.  Pulmonary:     Effort: Pulmonary effort is normal. No respiratory distress.  Musculoskeletal: Normal range of motion.  Skin:    General: Skin is warm and dry.  Neurological:     General: No focal deficit present.     Mental Status: He is alert and oriented to person, place, and time.  Psychiatric:        Mood and Affect: Mood normal.        Behavior: Behavior normal.        Thought Content: Thought content normal.        Judgment: Judgment normal.

## 2018-09-22 ENCOUNTER — Ambulatory Visit (INDEPENDENT_AMBULATORY_CARE_PROVIDER_SITE_OTHER): Payer: Medicare HMO | Admitting: Licensed Clinical Social Worker

## 2018-09-22 DIAGNOSIS — J439 Emphysema, unspecified: Secondary | ICD-10-CM

## 2018-09-22 DIAGNOSIS — I5032 Chronic diastolic (congestive) heart failure: Secondary | ICD-10-CM

## 2018-09-22 DIAGNOSIS — F0390 Unspecified dementia without behavioral disturbance: Secondary | ICD-10-CM

## 2018-09-22 DIAGNOSIS — N183 Chronic kidney disease, stage 3 unspecified: Secondary | ICD-10-CM

## 2018-09-22 DIAGNOSIS — I10 Essential (primary) hypertension: Secondary | ICD-10-CM

## 2018-09-22 DIAGNOSIS — F411 Generalized anxiety disorder: Secondary | ICD-10-CM

## 2018-09-22 NOTE — Patient Instructions (Signed)
Licensed Clinical Social Worker Visit Information  Goals we discussed today:  Goals        . "I want to make sure I can keep control of my anxiety"  (pt-stated)     Clinical Social Work Clinical Goal(s): Over the next 30 days client and family caregiver will verbalize understanding of long term plan for management of anxiety with panic attacks.    Interventions: . Previously reviewed with client of strategies to manage symptoms faced.  . Talked with client/son of client about social support network of client . Previously encouraged client to talk with [facility] regarding nursing needs of client  Patient Self Care Activities:  . Participates in morning breakfast gathering with friends. [name] with family. Sees son each Saturday.   .  Talks openly about his struggles with anxiety and medications taken for anxiety.   Plan:   Client to communicate with family members as needed to discuss ongoing client needs. Client to communicate with LCSW as needed to discuss psychosocial needs of client LCSW to call client in next 3 weeks to discuss client management of anxiety and panic symptoms Client to attend scheduled medical appointments Client to communicate with RN CM as needed to discuss nursing needs of client. Client to socialize with family or friends as a way to manage anxiety or stress symptoms      . "I want to talk with my son about advanced directive" (pt-stated)     Current Barriers:  [name] Mental Health Concerns   Clinical Social Work Clinical Goal(s): Over the next 30 days, client will work with LCSW to address concerns related to completion of HCPOA  Interventions: . Previously LCSW spoke with client about completion of HCPOA . LCSW mailed, at client request, Advanced Directives booklet to son of client . Previously LCSW encouraged client to talk with his son about completion of HCPOA   Patient Self Care Activities:  . Client talks with his son regarding medical decisions for  client.  Plan:  . LCSW to call client in 3 weeks to discuss HCPOA completion with client.  . Client to call LCSW as needed to discuss psychosocial needs of client.  . Client to communicate with his son regarding completion of HCPOA for client  *initial goal documentation          Materials Provided: No  Follow Up Plan: LCSW to call client in next 3 weeks to talk with client about management of anxiety and panic symptoms experienced by client  The patient/son of client verbalized understanding of instructions provided today and declined a print copy of patient instruction materials.   [name].Thedore Pickel MSW, LCSW Licensed Clinical Social Worker [facility] [phone]

## 2018-09-22 NOTE — Chronic Care Management (AMB) (Addendum)
Care Management Note   Steven [name] is a 85 y.o. year old male who is a primary care patient of [name], [place]. The CM team was consulted for assistance with chronic disease management and care coordination.   I reached out to Bud Face Zeiders by phone today.   Review of patient status, including review of consultants reports, relevant laboratory and other test results, and collaboration with appropriate care team members and the patient's provider was performed as part of comprehensive patient evaluation and provision of chronic care management services.   Social Determinants of Health: risk of social isolation; risk of tobacco use; risk of stress    Chronic Care Management from 04/08/2018 in [facility]  PHQ-9 Total Score  5     GAD 7 : Generalized Anxiety Score 05/08/2016  Nervous, Anxious, on Edge 1  Control/stop worrying 1  Worry too much - different things 0  Trouble relaxing 1  Restless 1  Easily annoyed or irritable 0  Afraid - awful might happen 0  Total GAD 7 Score 4  Anxiety Difficulty Not difficult at all    Goals        . "I want to make sure I can keep control of my anxiety"  (pt-stated)     Clinical Social Work Clinical Goal(s): Over the next 30 days client and family caregiver will verbalize understanding of long term plan for management of anxiety with panic attacks.    Interventions: . Previously reviewed with client of strategies to manage symptoms faced.  . Talked with client/son of client about social support network of client . Previously encouraged client to talk with [facility] regarding nursing needs of client . Talked with client/son of client about social work needs of client  Patient Self Care Activities:  . Participates in morning breakfast gathering with friends. [name] with family. Sees son each Saturday.   .  Talks openly about his struggles with anxiety and medications taken for anxiety.   Plan:   Client to  communicate with family members as needed to discuss ongoing client needs. Client to communicate with LCSW as needed to discuss psychosocial needs of client LCSW to call client in next 3 weeks to discuss client management of anxiety and panic symptoms Client to attend scheduled medical appointments Client to communicate with RN CM as needed to discuss nursing needs of client. Client to socialize with family or friends as a way to manage anxiety or stress symptoms      . "I want to talk with my son about advanced directive" (pt-stated)     Current Barriers:  [name] Mental Health Concerns   Clinical Social Work Clinical Goal(s): Over the next 30 days, client will work with LCSW to address concerns related to completion of HCPOA  Interventions: . Previously LCSW spoke with client about completion of HCPOA . LCSW mailed, at client request, Advanced Directives booklet to son of client . Previously LCSW encouraged client to talk with his son about completion of HCPOA  Patient Self Care Activities:  . Client talks with his son regarding medical decisions for client.  Plan:  . LCSW to call client in 3 weeks to discuss HCPOA completion with client.  . Client to call LCSW as needed to discuss psychosocial needs of client.  . Client to communicate with his son regarding completion of HCPOA for client  *initial goal documentation       Client likes playing the guitar to help him relax.  Client enjoys visiting with friends at [facility] for breakfast.Client is eating adequately. 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 Client has support from his son.  Client is sleeping adequately. LCSW encouraged that client/son of client call LCSW as needed to discuss social work needs of client  Follow Up Plan: LCSW to call client in next 3 weeks to talk with client about managing anxiety and panic symptoms of client  [name].Khyle Goodell MSW, LCSW Licensed Clinical Social Worker  [facility] [phone]  "I have reviewed this encounter including the documentation in this note and/or discussed this patient with [name] . I am certifying that I agree with the content of this note as supervising physician." [place], [name]

## 2018-10-14 ENCOUNTER — Ambulatory Visit (INDEPENDENT_AMBULATORY_CARE_PROVIDER_SITE_OTHER): Payer: Medicare HMO | Admitting: Licensed Clinical Social Worker

## 2018-10-14 DIAGNOSIS — I10 Essential (primary) hypertension: Secondary | ICD-10-CM | POA: Diagnosis not present

## 2018-10-14 DIAGNOSIS — N183 Chronic kidney disease, stage 3 unspecified: Secondary | ICD-10-CM

## 2018-10-14 DIAGNOSIS — J439 Emphysema, unspecified: Secondary | ICD-10-CM | POA: Diagnosis not present

## 2018-10-14 DIAGNOSIS — I5032 Chronic diastolic (congestive) heart failure: Secondary | ICD-10-CM

## 2018-10-14 DIAGNOSIS — F0390 Unspecified dementia without behavioral disturbance: Secondary | ICD-10-CM | POA: Diagnosis not present

## 2018-10-14 DIAGNOSIS — F411 Generalized anxiety disorder: Secondary | ICD-10-CM

## 2018-10-14 NOTE — Chronic Care Management (AMB) (Addendum)
Care Management Note   Steven [name] is a 85 y.o. year old male who is a primary care patient of [name], [place]. The CM team was consulted for assistance with chronic disease management and care coordination.   I reached out to Steven [name] by phone today.   Review of patient status, including review of consultants reports, relevant laboratory and other test results, and collaboration with appropriate care team members and the patient's provider was performed as part of comprehensive patient evaluation and provision of chronic care management services.   Social Determinants of Health: risk of social isolation; risk of tobacco use; risk of stress;     Chronic Care Management from 04/08/2018 in [place]  PHQ-9 Total Score  5     GAD 7 : Generalized Anxiety Score 05/08/2016  Nervous, Anxious, on Edge 1  Control/stop worrying 1  Worry too much - different things 0  Trouble relaxing 1  Restless 1  Easily annoyed or irritable 0  Afraid - awful might happen 0  Total GAD 7 Score 4  Anxiety Difficulty Not difficult at all       albuterol (PROVENTIL) (2.5 MG/3ML) 0.083% nebulizer solution    aspirin 81 MG tablet    atorvastatin (LIPITOR) 10 MG tablet    clonazePAM (KLONOPIN) 1 MG tablet    diltiazem (CARDIZEM CD) 120 MG 24 hr capsule    doxazosin (CARDURA) 2 MG tablet    furosemide (LASIX) 20 MG tablet    ipratropium-albuterol (DUONEB) 0.5-2.5 (3) MG/3ML SOLN    iron polysaccharides (FERREX 150) 150 MG capsule     Goals        . "I want to make sure I can keep control of my anxiety"  (pt-stated)     Clinical Social Work Clinical Goal(s): Over the next 30 days client and family caregiver will verbalize understanding of long term plan for management of anxiety with panic attacks.    Interventions: . Talked with client about relaxation techniques of choice (watching TV, playing guitar, sharing breakfast with friends at [employer]  [name] Talked with  client about social support network of client . Encouraged client to talk with [facility] regarding nursing needs of client . Talked with client about support he receives from his son and from his daughter  Patient Self Care Activities:  . Participates in morning breakfast gathering with friends. Steven [name] with family. Sees son each Saturday.   .  Talks openly about his struggles with anxiety and medications taken for anxiety.   Plan:   Client to communicate with family members as needed to discuss ongoing client needs. Client to communicate with LCSW as needed to discuss psychosocial needs of client LCSW to call client in next 3 weeks to discuss client management of anxiety and panic symptoms Client to attend scheduled medical appointments Client to communicate with RN CM as needed to discuss nursing needs of client. Client to socialize with family or friends as a way to manage anxiety or stress symptoms          . "I want to talk with my son about advanced directive" (pt-stated)     Current Barriers:  [name] Mental Health Concerns   Clinical Social Work Clinical Goal(s): Over the next 30 days, client will work with LCSW to address concerns related to completion of HCPOA  Interventions: . Previously LCSW spoke with client about completion of HCPOA . Previously LCSW mailed, at client request, Advanced Directives booklet to son of  client . LCSW encouraged client to talk with his son about completion of HCPOA  Patient Self Care Activities:  . Client talks with his son regarding medical decisions for client.  Plan:  . LCSW to call client in 3 weeks to discuss HCPOA completion with client.  . Client to call LCSW as needed to discuss psychosocial needs of client.  . Client to communicate with his son regarding completion of HCPOA for client  *initial goal documentation     Client said he has his prescribed medications and is taking medications as prescribed.He likes to have breakfast  most mornings with his friends at [employer]. 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 He said he has some pain in his lower extremities when he walks. LCSW encouraged client  to continue talking with [facility] regarding nursing needs of client. He said he watches TV to relax and he said he sometimes plays guitar to relax. He talked of support he receives regularly from his son. Client said he is hearing well.   LCSW encouraged client to call LCSW as needed to discuss social work needs of client. Client said he is eating well and went to grocery store today to buy food items. He said his daughter also sometimes will bring food for him to eat. He said he is sleeping well.  LCSW talked with client about client completion of HCPOA. LCSW encouraged Steven [name] to talk with his son about completion of HCPOA for client.  Follow Up Plan: LCSW to call client in next 3 weeks to talk with client about client management of panic and anxiety symptoms.  Steven [name].Monta Police MSW, LCSW Licensed Clinical Social Worker [facility] [phone]  "I have reviewed this encounter including the documentation in this note and/or discussed this patient with [name] . I am certifying that I agree with the content of this note as supervising physician." [place], [name]

## 2018-10-14 NOTE — Patient Instructions (Signed)
Licensed Clinical Social Worker Visit Information  Goals we discussed today:  Goals        . "I want to make sure I can keep control of my anxiety"  (pt-stated)     Clinical Social Work Clinical Goal(s): Over the next 30 days client and family caregiver will verbalize understanding of long term plan for management of anxiety with panic attacks.    Interventions: . Talked with client about use of relaxation techniques (playing guitar, watching TV, having        breakfast with friends at [employer]  . Talked with client about social support network of client . Encouraged client to talk with [facility] regarding nursing needs of client . Talked with client about support he receives from his son and from his daughter  Patient Self Care Activities:  . Participates in morning breakfast gathering with friends. [name] with family. Sees son each Saturday.   .  Talks openly about his struggles with anxiety and medications taken for anxiety.   Plan:   Client to communicate with family members as needed to discuss ongoing client needs. Client to communicate with LCSW as needed to discuss psychosocial needs of client LCSW to call client in next 3 weeks to discuss client management of anxiety and panic symptoms Client to attend scheduled medical appointments Client to communicate with RN CM as needed to discuss nursing needs of client. Client to socialize with family or friends as a way to manage anxiety or stress symptoms              . "I want to talk with my son about advanced directive" (pt-stated)     Current Barriers:  [name] Mental Health Concerns    Clinical Social Work Clinical Goal(s): Over the next 30 days, client will work with LCSW to address concerns related to completion of HCPOA  Interventions: . LCSW spoke with client about completion of HCPOA . Previously LCSW mailed, at client request, Advanced Directives booklet to son of client . LCSW encouraged client to talk with his son  about completion of HCPOA  Patient Self Care Activities:  . Client talks with his son regarding medical decisions for client.  Plan:  . LCSW to call client in 3 weeks to discuss HCPOA completion with client.  . Client to call LCSW as needed to discuss psychosocial needs of client.  . Client to communicate with his son regarding completion of HCPOA for client  *initial goal documentation      Materials Provided: No  Follow Up Plan: LCSW to call client in next 3 weeks to talk with client about client management of anxiety and panic symptoms  The patient verbalized understanding of instructions provided today and declined a print copy of patient instruction materials.   [name].Aleece Loyd MSW, LCSW Licensed Clinical Social Worker [facility] [phone]

## 2018-11-06 ENCOUNTER — Ambulatory Visit (INDEPENDENT_AMBULATORY_CARE_PROVIDER_SITE_OTHER): Payer: Medicare HMO | Admitting: Licensed Clinical Social Worker

## 2018-11-06 DIAGNOSIS — I5032 Chronic diastolic (congestive) heart failure: Secondary | ICD-10-CM | POA: Diagnosis not present

## 2018-11-06 DIAGNOSIS — F0390 Unspecified dementia without behavioral disturbance: Secondary | ICD-10-CM | POA: Diagnosis not present

## 2018-11-06 DIAGNOSIS — I10 Essential (primary) hypertension: Secondary | ICD-10-CM

## 2018-11-06 DIAGNOSIS — F411 Generalized anxiety disorder: Secondary | ICD-10-CM

## 2018-11-06 NOTE — Patient Instructions (Signed)
Licensed Clinical Social Worker Visit Information  Goals we discussed today:  Goals        . "I want to make sure I can keep control of my anxiety"  (pt-stated)     Clinical Social Work Clinical Goal(s): Over the next 30 days client and family caregiver will verbalize understanding of long term plan for management of anxiety with panic attacks.    Interventions: . Talked with client about social support network of client . Encouraged client to talk with [facility] regarding nursing needs of client . Talked with client about relaxation techniques of choice for client (he likes to play the guitar) . Talked with client about transportation needs of client  Patient Self Care Activities:  . Participates in morning breakfast gathering with friends. [name] with family. Sees son each Saturday.   .  Talks openly about his struggles with anxiety and medications taken for anxiety.   Plan:   Client to communicate with family members as needed to discuss ongoing client needs. Client to communicate with LCSW as needed to discuss psychosocial needs of client LCSW to call client in next 3 weeks to discuss client management of anxiety and panic symptoms Client to attend scheduled medical appointments Client to communicate with RN CM as needed to discuss nursing needs of client. Client to socialize with family or friends as a way to manage anxiety or stress symptoms      . "I want to talk with my son about advanced directive" (pt-stated)     Current Barriers:  [name] Mental Health Concerns   Clinical Social Work Clinical Goal(s): Over the next 30 days, client will work with LCSW to address concerns related to completion of HCPOA  Interventions: . LCSW previously spoke with client about completion of HCPOA . LCSW previously mailed, at client request, Advanced Directives booklet to son of client . LCSW previously encouraged client to talk with his son about completion of HCPOA  Patient Self Care  Activities:  . Client talks with his son regarding medical decisions for client.  Plan:  . LCSW to call client in 3 weeks to discuss HCPOA completion with client.  . Client to call LCSW as needed to discuss psychosocial needs of client.  . Client to communicate with his son regarding completion of HCPOA for client  *initial goal documentation          Materials Provided: No   Follow Up Plan: LCSW to call client in next 3 weeks to talk with client about client management of anxiety and panic issues  The patient verbalized understanding of instructions provided today and declined a print copy of patient instruction materials.   [name].Anupama Piehl MSW, LCSW Licensed Clinical Social Worker [facility] [phone]

## 2018-11-06 NOTE — Chronic Care Management (AMB) (Addendum)
Care Management Note   Steven [name] is a 85 y.o. year old male who is a primary care patient of [name], [place]. The CM team was consulted for assistance with chronic disease management and care coordination.   I reached out to Bud Face Mckeehan by phone today.     Review of patient status, including review of consultants reports, relevant laboratory and other test results, and collaboration with appropriate care team members and the patient's provider was performed as part of comprehensive patient evaluation and provision of chronic care management services.   Social Determinants of Health: Risk of social isolation; risk of tobacco use; risk of stress    Chronic Care Management from 04/08/2018 in [place]  PHQ-9 Total Score  5     GAD 7 : Generalized Anxiety Score 05/08/2016  Nervous, Anxious, on Edge 1  Control/stop worrying 1  Worry too much - different things 0  Trouble relaxing 1  Restless 1  Easily annoyed or irritable 0  Afraid - awful might happen 0  Total GAD 7 Score 4  Anxiety Difficulty Not difficult at all   Medications    albuterol (PROVENTIL) (2.5 MG/3ML) 0.083% nebulizer solution    aspirin 81 MG tablet    atorvastatin (LIPITOR) 10 MG tablet    clonazePAM (KLONOPIN) 1 MG tablet    diltiazem (CARDIZEM CD) 120 MG 24 hr capsule    doxazosin (CARDURA) 2 MG tablet    furosemide (LASIX) 20 MG tablet    ipratropium-albuterol (DUONEB) 0.5-2.5 (3) MG/3ML SOLN    iron polysaccharides (FERREX 150) 150 MG capsule    Goals        . "I want to make sure I can keep control of my anxiety"  (pt-stated)     Clinical Social Work Clinical Goal(s): Over the next 30 days client and family caregiver will verbalize understanding of long term plan for management of anxiety with panic attacks.    Interventions: . Talked with client about social support network of client . Encouraged client to talk with [facility] regarding nursing needs of client . Talked  with client about relaxation techniques ( plays guitar) . Talked with client about transportation needs of client  Patient Self Care Activities:  . Participates in morning breakfast gathering with friends. [name] with family. Sees son each Saturday.   .  Talks openly about his struggles with anxiety and medications taken for anxiety.   Plan:   Client to communicate with family members as needed to discuss ongoing client needs. Client to communicate with LCSW as needed to discuss psychosocial needs of client LCSW to call client in next 3 weeks to discuss client management of anxiety and panic symptoms Client to attend scheduled medical appointments Client to communicate with RN CM as needed to discuss nursing needs of client. Client to socialize with family or friends as a way to manage anxiety or stress symptoms          . "I want to talk with my son about advanced directive" (pt-stated)     Current Barriers:  [name] Mental Health Concerns   Clinical Social Work Clinical Goal(s): Over the next 30 days, client will work with LCSW to address concerns related to completion of HCPOA  Interventions: . LCSW previously spoke with client about completion of HCPOA . LCSW previously mailed, at client request, Advanced Directives booklet to son of client . LCSW previously encouraged client to talk with his son about completion of HCPOA  Patient Self Care Activities:  . Client talks with his son regarding medical decisions for client.  Plan:  . LCSW to call client in 3 weeks to discuss HCPOA completion with client.  . Client to call LCSW as needed to discuss psychosocial needs of client.  . Client to communicate with his son regarding completion of HCPOA for client  *initial goal documentation    Follow Up Plan: LCSW to call client in next 3 weeks to talk with client about client management of anxiety and panic symptoms faced  [name].Sage Hammill MSW, LCSW Licensed Clinical Social  Worker [facility] [phone]  "I have reviewed this encounter including the documentation in this note and/or discussed this patient with [name] . I am certifying that I agree with the content of this note as supervising physician." [place], [name]

## 2018-11-12 ENCOUNTER — Other Ambulatory Visit: Payer: Self-pay | Admitting: Nurse Practitioner

## 2018-11-12 DIAGNOSIS — E785 Hyperlipidemia, unspecified: Secondary | ICD-10-CM

## 2018-11-12 DIAGNOSIS — I5032 Chronic diastolic (congestive) heart failure: Secondary | ICD-10-CM

## 2018-11-12 DIAGNOSIS — I10 Essential (primary) hypertension: Secondary | ICD-10-CM

## 2018-11-12 DIAGNOSIS — N400 Enlarged prostate without lower urinary tract symptoms: Secondary | ICD-10-CM

## 2018-11-20 ENCOUNTER — Other Ambulatory Visit: Payer: Self-pay | Admitting: Nurse Practitioner

## 2018-11-20 DIAGNOSIS — N400 Enlarged prostate without lower urinary tract symptoms: Secondary | ICD-10-CM

## 2018-11-20 DIAGNOSIS — F411 Generalized anxiety disorder: Secondary | ICD-10-CM

## 2018-11-26 ENCOUNTER — Other Ambulatory Visit: Payer: Self-pay | Admitting: Nurse Practitioner

## 2018-11-26 DIAGNOSIS — F411 Generalized anxiety disorder: Secondary | ICD-10-CM

## 2018-11-28 ENCOUNTER — Ambulatory Visit: Payer: Self-pay | Admitting: Licensed Clinical Social Worker

## 2018-11-28 DIAGNOSIS — F411 Generalized anxiety disorder: Secondary | ICD-10-CM

## 2018-11-28 DIAGNOSIS — I5032 Chronic diastolic (congestive) heart failure: Secondary | ICD-10-CM

## 2018-11-28 DIAGNOSIS — I10 Essential (primary) hypertension: Secondary | ICD-10-CM

## 2018-11-28 DIAGNOSIS — F0390 Unspecified dementia without behavioral disturbance: Secondary | ICD-10-CM

## 2018-11-28 NOTE — Chronic Care Management (AMB) (Addendum)
Care Management Note   Steven [name] is a 85 y.o. year old male who is a primary care patient of [name], [place]. The CM team was consulted for assistance with chronic disease management and care coordination.   I reached out to Steven [name] by phone today.     Review of patient status, including review of consultants reports, relevant laboratory and other test results, and collaboration with appropriate care team members and the patient's provider was performed as part of comprehensive patient evaluation and provision of chronic care management services.   Social determinants of health: risk of social isolation; risk of tobacco use; risk of stress    Chronic Care Management from 04/08/2018 in [place]  PHQ-9 Total Score  5      GAD 7 : Generalized Anxiety Score 05/08/2016  Nervous, Anxious, on Edge 1  Control/stop worrying 1  Worry too much - different things 0  Trouble relaxing 1  Restless 1  Easily annoyed or irritable 0  Afraid - awful might happen 0  Total GAD 7 Score 4  Anxiety Difficulty Not difficult at all   Medications    albuterol (PROVENTIL) (2.5 MG/3ML) 0.083% nebulizer solution    aspirin 81 MG tablet    atorvastatin (LIPITOR) 10 MG tablet    clonazePAM (KLONOPIN) 1 MG tablet    diltiazem (CARDIZEM CD) 120 MG 24 hr capsule    doxazosin (CARDURA) 2 MG tablet    furosemide (LASIX) 20 MG tablet    ipratropium-albuterol (DUONEB) 0.5-2.5 (3) MG/3ML SOLN    iron polysaccharides (FERREX 150) 150 MG capsule     Goals    . "I want to make sure I can keep control of my anxiety"  (pt-stated)     Clinical Social Work Clinical Goal(s): Over the next 30 days client and family caregiver will verbalize understanding of long term plan for management of anxiety with panic attacks.    Interventions: . Previously reviewed with client strategies to manage symptoms faced.  . Talked with client about social support network of client (enjoys  visiting with friends at breakfast) . Encouraged client previously to talk with [facility] regarding nursing needs of client . Talked with client about pain issues faced by client. (client said he has pain in his lower legs) . Collaborated with [name] [name] regarding client needs  Patient Self Care Activities:  . Participates in morning breakfast gathering with friends. Steven [name] with family. Sees son each Saturday.   .  Talks openly about his struggles with anxiety and medications taken for anxiety.   Plan:   Client to communicate with family members as needed to discuss ongoing client needs. Client to communicate with LCSW as needed to discuss psychosocial needs of client LCSW to call client in next 3 weeks to discuss client management of anxiety and panic symptoms Client to attend scheduled medical appointments Client to communicate with RN CM as needed to discuss nursing needs of client. Client to socialize with family or friends as a way to manage anxiety or stress symptoms            Follow Up Plan: LCSW to call client in next 3 weeks to discuss client management of anxiety and panic symptoms  Steven [name] MSW, LCSW Licensed Clinical Social Worker [facility] [phone]   "I have reviewed this encounter including the documentation in this note and/or discussed this patient with [name] . I am certifying that  I agree with the content of this note as supervising physician." [place], [name]

## 2018-11-28 NOTE — Patient Instructions (Signed)
Licensed Clinical Social Worker Visit Information  Goals we discussed today:  Goals        . "I want to make sure I can keep control of my anxiety"  (pt-stated)     Clinical Social Work Clinical Goal(s): Over the next 30 days client and family caregiver will verbalize understanding of long term plan for management of anxiety with panic attacks.    Interventions: . Previously reviewed with client strategies to manage symptoms faced.  . Talked with client about social support network of client (enjoys visiting with friends for breakfast) . Encouraged client previously to talk with [facility] regarding nursing needs of client   Collaborated with [name] [name] regarding client needs   Patient Self Care Activities:  . Participates in morning breakfast gathering with friends. [name] with family. Sees son each Saturday.   .  Talks openly about his struggles with anxiety and medications taken for anxiety.   Plan:   Client to communicate with family members as needed to discuss ongoing client needs. Client to communicate with LCSW as needed to discuss psychosocial needs of client LCSW to call client in next 3 weeks to discuss client management of anxiety and panic symptoms Client to attend scheduled medical appointments Client to communicate with RN CM as needed to discuss nursing needs of client. Client to socialize with family or friends as a way to manage anxiety or stress symptoms              Materials Provided: No  Follow Up Plan: LCSW to call client in next 3 weeks to discuss client management of anxiety and panic symptoms  The patient verbalized understanding of instructions provided today and declined a print copy of patient instruction materials.   [name].Oaklen Thiam MSW, LCSW Licensed Clinical Social Worker [facility] [phone]

## 2018-12-10 ENCOUNTER — Other Ambulatory Visit: Payer: Self-pay

## 2018-12-11 ENCOUNTER — Encounter: Payer: Self-pay | Admitting: Nurse Practitioner

## 2018-12-11 ENCOUNTER — Ambulatory Visit (INDEPENDENT_AMBULATORY_CARE_PROVIDER_SITE_OTHER): Payer: Medicare HMO | Admitting: Nurse Practitioner

## 2018-12-11 ENCOUNTER — Other Ambulatory Visit: Payer: Self-pay | Admitting: Nurse Practitioner

## 2018-12-11 VITALS — BP 118/51 | HR 68 | Temp 98.7°F | Resp 20 | Ht 67.0 in | Wt 130.0 lb

## 2018-12-11 DIAGNOSIS — C3490 Malignant neoplasm of unspecified part of unspecified bronchus or lung: Secondary | ICD-10-CM

## 2018-12-11 DIAGNOSIS — E785 Hyperlipidemia, unspecified: Secondary | ICD-10-CM

## 2018-12-11 DIAGNOSIS — I10 Essential (primary) hypertension: Secondary | ICD-10-CM

## 2018-12-11 DIAGNOSIS — N1832 Chronic kidney disease, stage 3b: Secondary | ICD-10-CM

## 2018-12-11 DIAGNOSIS — N400 Enlarged prostate without lower urinary tract symptoms: Secondary | ICD-10-CM

## 2018-12-11 DIAGNOSIS — F411 Generalized anxiety disorder: Secondary | ICD-10-CM

## 2018-12-11 DIAGNOSIS — F0390 Unspecified dementia without behavioral disturbance: Secondary | ICD-10-CM

## 2018-12-11 DIAGNOSIS — D508 Other iron deficiency anemias: Secondary | ICD-10-CM

## 2018-12-11 DIAGNOSIS — J439 Emphysema, unspecified: Secondary | ICD-10-CM | POA: Diagnosis not present

## 2018-12-11 DIAGNOSIS — I5032 Chronic diastolic (congestive) heart failure: Secondary | ICD-10-CM | POA: Diagnosis not present

## 2018-12-11 DIAGNOSIS — I130 Hypertensive heart and chronic kidney disease with heart failure and stage 1 through stage 4 chronic kidney disease, or unspecified chronic kidney disease: Secondary | ICD-10-CM | POA: Diagnosis not present

## 2018-12-11 MED ORDER — ATORVASTATIN CALCIUM 10 MG PO TABS: ORAL_TABLET | ORAL | 0 refills | Status: DC

## 2018-12-11 MED ORDER — CLONAZEPAM 1 MG PO TABS: 1.0000 mg | ORAL_TABLET | Freq: Three times a day (TID) | ORAL | 4 refills | Status: DC | PRN

## 2018-12-11 MED ORDER — DILTIAZEM HCL ER COATED BEADS 120 MG PO CP24: 120.0000 mg | ORAL_CAPSULE | Freq: Every day | ORAL | 0 refills | Status: DC

## 2018-12-11 MED ORDER — FUROSEMIDE 20 MG PO TABS: 20.0000 mg | ORAL_TABLET | ORAL | 0 refills | Status: DC

## 2018-12-11 MED ORDER — POLYSACCHARIDE IRON COMPLEX 150 MG PO CAPS: 150.0000 mg | ORAL_CAPSULE | Freq: Every day | ORAL | 0 refills | Status: DC

## 2018-12-11 MED ORDER — DOXAZOSIN MESYLATE 2 MG PO TABS: ORAL_TABLET | ORAL | 0 refills | Status: DC

## 2018-12-11 NOTE — Progress Notes (Signed)
Subjective:    Patient ID: Steven [name], male    DOB: 1928/05/13, 85 y.o.   MRN: [id number]   Chief Complaint: Medical Management of Chronic Issues    HPI:  1. Essential hypertension No c/o chest pain, sob or headache. Does not check blood pressure at home. BP Readings from Last 3 Encounters:  12/11/18 (!) 118/51  09/10/18 139/64  02/14/18 133/66      2. Chronic diastolic congestive heart failure ([place]) Doing well no sob. Saw cardiology in december of 2019. According to office note, no changes were made to plan of care.  3. Pulmonary emphysema, unspecified emphysema type ([place]) Says his breathing is good. Uses albuterol and duoneb when needed, but has not needed it at all in the last 6  months  4. Squamous cell carcinoma of lung, unspecified laterality ([place]) No recnet reoccurence since treatment in 2014.  5. Hyperlipidemia with target LDL less than 100 Does not watch diet and does little exercise Lab Results  Component Value Date   CHOL 124 02/14/2018   HDL 67 02/14/2018   LDLCALC 41 02/14/2018   TRIG 78 02/14/2018   CHOLHDL 1.9 02/14/2018      6. Anxiety state Stays stressed. GAD 7 : Generalized Anxiety Score 12/11/2018 05/08/2016  Nervous, Anxious, on Edge 1 1  Control/stop worrying 1 1  Worry too much - different things 1 0  Trouble relaxing 0 1  Restless 1 1  Easily annoyed or irritable 0 0  Afraid - awful might happen 0 0  Total GAD 7 Score 4 4  Anxiety Difficulty Not difficult at all Not difficult at all     7. Stage 3b chronic kidney disease *no problems passing his water Lab Results  Component Value Date   CREATININE 1.46 (H) 02/14/2018     8. Dementia without behavioral disturbance, unspecified dementia type ([place]) Ha snot had any recent episodes of confusion.    Outpatient Encounter Medications as of 12/11/2018  Medication Sig  . albuterol (PROVENTIL) (2.5 MG/3ML) 0.083% nebulizer solution Take 3 mLs (2.5 mg total) by nebulization every 2  (two) hours as needed for wheezing or shortness of breath.  [name] aspirin 81 MG tablet Take 81 mg by mouth daily.   [name] atorvastatin (LIPITOR) 10 MG tablet TAKE (1) TABLET BY MOUTH ONCE DAILY.(Needs to be seen before next refill)  . clonazePAM (KLONOPIN) 1 MG tablet TAKE (1) TABLET BY MOUTH THREE TIMES DAILY.  [name] diltiazem (CARDIZEM CD) 120 MG 24 hr capsule Take 1 capsule (120 mg total) by mouth daily. (Needs to be seen before next refill)  . doxazosin (CARDURA) 2 MG tablet (Needs to be seen before next refill)TAKE (1) TABLET BY MOUTH ONCE DAILY.  . furosemide (LASIX) 20 MG tablet Take 1 tablet (20 mg total) by mouth every other day. (Needs to be seen before next refill)  . ipratropium-albuterol (DUONEB) 0.5-2.5 (3) MG/3ML SOLN Take 3 mLs by nebulization 3 (three) times daily.  . iron polysaccharides (FERREX 150) 150 MG capsule Take 1 capsule (150 mg total) by mouth daily. (Needs to be seen before next refill)     Past Surgical History:  Procedure Laterality Date  . CARPAL TUNNEL RELEASE  1980's   right and left  . CHOLECYSTECTOMY    . COLONOSCOPY N/A 09/29/2014   Procedure: COLONOSCOPY;  Surgeon: Rogene Houston, MD;  [facility];  Service: Endoscopy;  Laterality: N/A;  . CORONARY ARTERY BYPASS GRAFT  1999   5 grafts  .  FLEXIBLE BRONCHOSCOPY  11/07/2011   Procedure: FLEXIBLE BRONCHOSCOPY;  Surgeon: Gaye Pollack, MD;  Location: [facility];  Service: Thoracic;  Laterality: N/A;  . HERNIA REPAIR  7035   umbilical hernia repair  . JOINT REPLACEMENT  2000   right shoulder rotator cuff repair    Family History  Problem Relation Age of Onset  . Cancer Mother        pancreatic  . Pulmonary embolism Father   . Anxiety disorder Other     New complaints: None today  Social history: Lives by hisself. His son comes to see him often  Controlled substance contract: n/a    Review of Systems  Constitutional: Negative for activity change and appetite change.  HENT: Negative.   Eyes:  Negative for pain.  Respiratory: Negative for shortness of breath.   Cardiovascular: Negative for chest pain, palpitations and leg swelling.  Gastrointestinal: Negative for abdominal pain.  Endocrine: Negative for polydipsia.  Genitourinary: Negative.   Skin: Negative for rash.  Neurological: Negative for dizziness, weakness and headaches.  Hematological: Does not bruise/bleed easily.  Psychiatric/Behavioral: Negative.   All other systems reviewed and are negative.      Objective:   Physical Exam Vitals signs and nursing note reviewed.  Constitutional:      Appearance: Normal appearance. He is well-developed.  HENT:     Head: Normocephalic.     Nose: Nose normal.  Eyes:     Pupils: Pupils are equal, round, and reactive to light.  Neck:     Musculoskeletal: Normal range of motion and neck supple.     Thyroid: No thyroid mass or thyromegaly.     Vascular: No carotid bruit or JVD.     Trachea: Phonation normal.  Cardiovascular:     Rate and Rhythm: Normal rate and regular rhythm.  Pulmonary:     Effort: Pulmonary effort is normal. No respiratory distress.     Breath sounds: Normal breath sounds.  Abdominal:     General: Bowel sounds are normal.     Palpations: Abdomen is soft.     Tenderness: There is no abdominal tenderness.  Musculoskeletal: Normal range of motion.  Lymphadenopathy:     Cervical: No cervical adenopathy.  Skin:    General: Skin is warm and dry.  Neurological:     Mental Status: He is alert and oriented to person, place, and time.  Psychiatric:        Behavior: Behavior normal.        Thought Content: Thought content normal.        Judgment: Judgment normal.    BP (!) 118/51   Pulse 68   Temp 98.7 F (37.1 C) (Temporal)   Resp 20   Ht 5\' 7"  (1.702 m)   Wt 130 lb (59 kg)   SpO2 99%   BMI 20.36 kg/m         Assessment & Plan:  Steven [name] comes in today with chief complaint of Medical Management of Chronic Issues   Diagnosis and  orders addressed:  1. Essential hypertension Low sodium diet - furosemide (LASIX) 20 MG tablet; Take 1 tablet (20 mg total) by mouth every other day. (Needs to be seen before next refill)  Dispense: 14 tablet; Refill: 0  2. Chronic diastolic congestive heart failure (HCC) Limit fluid intake - diltiazem (CARDIZEM CD) 120 MG 24 hr capsule; Take 1 capsule (120 mg total) by mouth daily. (Needs to be seen before next refill)  Dispense: 28 capsule; Refill:  0  3. Pulmonary emphysema, unspecified emphysema type ([place])  4. Squamous cell carcinoma of lung, unspecified laterality ([place])  5. Hyperlipidemia with target LDL less than 100 Low fat diet - atorvastatin (LIPITOR) 10 MG tablet; TAKE (1) TABLET BY MOUTH ONCE DAILY.(Needs to be seen before next refill)  Dispense: 28 tablet; Refill: 0  6. Anxiety state Stress management - clonazePAM (KLONOPIN) 1 MG tablet; Take 1 tablet (1 mg total) by mouth 3 (three) times daily as needed for anxiety.  Dispense: 90 tablet; Refill: 4  7. Stage 3b chronic kidney disease  8. Dementia without behavioral disturbance, unspecified dementia type ([place])  9. Benign prostatic hyperplasia without lower urinary tract symptoms - doxazosin (CARDURA) 2 MG tablet; (Needs to be seen before next refill)TAKE (1) TABLET BY MOUTH ONCE DAILY.  Dispense: 28 tablet; Refill: 0  10. Iron deficiency anemia secondary to inadequate dietary iron intake Labs pending - iron polysaccharides (FERREX 150) 150 MG capsule; Take 1 capsule (150 mg total) by mouth daily. (Needs to be seen before next refill)  Dispense: 28 capsule; Refill: 0   Labs pending Health Maintenance reviewed Diet and exercise encouraged  Follow up plan: 6 months   Mary-Margaret Hassell Done, [name]

## 2018-12-11 NOTE — Patient Instructions (Signed)

## 2018-12-12 LAB — CMP14+EGFR
ALT: 11 IU/L (ref 0–44)
AST: 19 IU/L (ref 0–40)
Albumin/Globulin Ratio: 1.5 (ref 1.2–2.2)
Albumin: 4.4 g/dL (ref 3.6–4.6)
Alkaline Phosphatase: 78 IU/L (ref 39–117)
BUN/Creatinine Ratio: 11 (ref 10–24)
BUN: 19 mg/dL (ref 8–27)
Bilirubin Total: 0.2 mg/dL (ref 0.0–1.2)
CO2: 29 mmol/L (ref 20–29)
Calcium: 9.7 mg/dL (ref 8.6–10.2)
Chloride: 98 mmol/L (ref 96–106)
Creatinine, Ser: 1.68 mg/dL — ABNORMAL HIGH (ref 0.76–1.27)
GFR calc Af Amer: 41 mL/min/{1.73_m2} — ABNORMAL LOW (ref >59)
GFR calc non Af Amer: 36 mL/min/{1.73_m2} — ABNORMAL LOW (ref >59)
Globulin, Total: 3 g/dL (ref 1.5–4.5)
Glucose: 110 mg/dL — ABNORMAL HIGH (ref 65–99)
Potassium: 4.4 mmol/L (ref 3.5–5.2)
Sodium: 141 mmol/L (ref 134–144)
Total Protein: 7.4 g/dL (ref 6.0–8.5)

## 2018-12-12 LAB — CBC WITH DIFFERENTIAL/PLATELET
Basophils Absolute: 0.1 10*3/uL (ref 0.0–0.2)
Basos: 1 %
EOS (ABSOLUTE): 0.4 10*3/uL (ref 0.0–0.4)
Eos: 6 %
Hematocrit: 36 % — ABNORMAL LOW (ref 37.5–51.0)
Hemoglobin: 12 g/dL — ABNORMAL LOW (ref 13.0–17.7)
Immature Grans (Abs): 0 10*3/uL (ref 0.0–0.1)
Immature Granulocytes: 0 %
Lymphocytes Absolute: 2.4 10*3/uL (ref 0.7–3.1)
Lymphs: 40 %
MCH: 31.5 pg (ref 26.6–33.0)
MCHC: 33.3 g/dL (ref 31.5–35.7)
MCV: 95 fL (ref 79–97)
Monocytes Absolute: 0.5 10*3/uL (ref 0.1–0.9)
Monocytes: 8 %
Neutrophils Absolute: 2.7 10*3/uL (ref 1.4–7.0)
Neutrophils: 45 %
Platelets: 154 10*3/uL (ref 150–450)
RBC: 3.81 x10E6/uL — ABNORMAL LOW (ref 4.14–5.80)
RDW: 12.3 % (ref 11.6–15.4)
WBC: 6.1 10*3/uL (ref 3.4–10.8)

## 2018-12-12 LAB — LIPID PANEL
Chol/HDL Ratio: 2.1 ratio (ref 0.0–5.0)
Cholesterol, Total: 121 mg/dL (ref 100–199)
HDL: 59 mg/dL (ref >39)
LDL Chol Calc (NIH): 40 mg/dL (ref 0–99)
Triglycerides: 128 mg/dL (ref 0–149)
VLDL Cholesterol Cal: 22 mg/dL (ref 5–40)

## 2018-12-17 ENCOUNTER — Other Ambulatory Visit: Payer: Self-pay | Admitting: Nurse Practitioner

## 2018-12-17 DIAGNOSIS — N400 Enlarged prostate without lower urinary tract symptoms: Secondary | ICD-10-CM

## 2018-12-17 DIAGNOSIS — I10 Essential (primary) hypertension: Secondary | ICD-10-CM

## 2018-12-17 DIAGNOSIS — D508 Other iron deficiency anemias: Secondary | ICD-10-CM

## 2018-12-17 DIAGNOSIS — I5032 Chronic diastolic (congestive) heart failure: Secondary | ICD-10-CM

## 2018-12-17 DIAGNOSIS — E785 Hyperlipidemia, unspecified: Secondary | ICD-10-CM

## 2018-12-19 ENCOUNTER — Telehealth: Payer: Self-pay

## 2018-12-19 ENCOUNTER — Ambulatory Visit (INDEPENDENT_AMBULATORY_CARE_PROVIDER_SITE_OTHER): Payer: Medicare HMO | Admitting: Licensed Clinical Social Worker

## 2018-12-19 DIAGNOSIS — F0390 Unspecified dementia without behavioral disturbance: Secondary | ICD-10-CM

## 2018-12-19 DIAGNOSIS — I5032 Chronic diastolic (congestive) heart failure: Secondary | ICD-10-CM

## 2018-12-19 DIAGNOSIS — F411 Generalized anxiety disorder: Secondary | ICD-10-CM

## 2018-12-19 DIAGNOSIS — J439 Emphysema, unspecified: Secondary | ICD-10-CM

## 2018-12-19 DIAGNOSIS — I10 Essential (primary) hypertension: Secondary | ICD-10-CM

## 2018-12-19 DIAGNOSIS — N1832 Chronic kidney disease, stage 3b: Secondary | ICD-10-CM

## 2018-12-19 NOTE — Patient Instructions (Addendum)
Licensed Clinical Social Worker Visit Information  Goals we discussed today:  Goals        . "I want to make sure I can keep control of my anxiety"  (pt-stated)     Clinical Social Work Clinical Goal(s): Over the next 30 days client and family caregiver will verbalize understanding of long term plan for management of anxiety with panic attacks.    Interventions:    Talked with client about social support network of client (friends, son)  Encouraged client to talk with [facility] regarding nursing needs of client  Talked with client about pain issues in his right shoulder  Talked with client about his upcoming appointments  Talked with client about relaxation techniques   Collaborated with [facility] Triage nurse, Jan, to discuss nursing needs of client   Patient Self Care Activities:  . Participates in morning breakfast gathering with friends. [name] with family. Sees son each Saturday.   .  Talks openly about his struggles with anxiety and medications taken for anxiety.   Plan:   Client to communicate with family members as needed to discuss ongoing client needs. Client to communicate with LCSW as needed to discuss psychosocial needs of client LCSW to call client in next 3 weeks to discuss client management of anxiety and panic symptoms Client to attend scheduled medical appointments Client to communicate with RN CM as needed to discuss nursing needs of client. Client to socialize with family or friends as a way to manage anxiety or stress symptoms           Materials Provided: No  Follow Up Plan: LCSW to call client in next 3 weeks to discuss client management of anxiety and panic symptoms of client  The patient verbalized understanding of instructions provided today and declined a print copy of patient instruction materials.   [name].Mathias Bogacki MSW, LCSW Licensed Clinical Social Worker [facility] [phone]

## 2018-12-19 NOTE — Chronic Care Management (AMB) (Addendum)
Care Management Note   Steven [name] is a 85 y.o. year old male who is a primary care patient of Steven [name], Steven [name]. The CM team was consulted for assistance with chronic disease management and care coordination.   I reached out to Steven [name] by phone today.    Review of patient status, including review of consultants reports, relevant laboratory and other test results, and collaboration with appropriate care team members and the patient's provider was performed as part of comprehensive patient evaluation and provision of chronic care management services.   Social determinants of health: risk of social isolation; risk of tobacco use; risk of stress; risk of physical inactivity    Chronic Care Management from 04/08/2018 in [place]  PHQ-9 Total Score  5     GAD 7 : Generalized Anxiety Score 12/11/2018 05/08/2016  Nervous, Anxious, on Edge 1 1  Control/stop worrying 1 1  Worry too much - different things 1 0  Trouble relaxing 0 1  Restless 1 1  Easily annoyed or irritable 0 0  Afraid - awful might happen 0 0  Total GAD 7 Score 4 4  Anxiety Difficulty Not difficult at all Not difficult at all   Medications    albuterol (PROVENTIL) (2.5 MG/3ML) 0.083% nebulizer solution    aspirin 81 MG tablet    atorvastatin (LIPITOR) 10 MG tablet    clonazePAM (KLONOPIN) 1 MG tablet    diltiazem (CARDIZEM CD) 120 MG 24 hr capsule    doxazosin (CARDURA) 2 MG tablet    FERREX 150 150 MG capsule    furosemide (LASIX) 20 MG tablet    ipratropium-albuterol (DUONEB) 0.5-2.5 (3) MG/3ML SOLN     Goals        . "I want to make sure I can keep control of my anxiety"  (pt-stated)     Clinical Social Work Clinical Goal(s): Over the next 30 days client and family caregiver will verbalize understanding of long term plan for management of anxiety with panic attacks.    Interventions: . Talked with client about social support network of client (friends, son) . Encouraged  client to talk with [facility] regarding nursing needs of client  Talked with client about pain issues in his right shoulder  Talked with client about his upcoming appointments  Talked with client about relaxation techniques   Collaborated with Steven [name], Steven [name], to discuss nursing needs of client  Patient Self Care Activities:  . Participates in morning breakfast gathering with friends. Steven [name] with family. Sees son each Saturday.   .  Talks openly about his struggles with anxiety and medications taken for anxiety.   Plan:   Client to communicate with family members as needed to discuss ongoing client needs. Client to communicate with LCSW as needed to discuss psychosocial needs of client LCSW to call client in next 3 weeks to discuss client management of anxiety and panic symptoms Client to attend scheduled medical appointments Client to communicate with RN CM as needed to discuss nursing needs of client. Client to socialize with family or friends as a way to manage anxiety or stress symptoms      Follow Up Plan: LCSW to call client in next 3 weeks to talk with client about his management of anxiety and panic symptoms.   Steven [name] MSW, LCSW Licensed Clinical Social Worker [facility] [phone]  "I have reviewed this encounter including the documentation in this note and/or discussed this  patient with Steven [name] . I am certifying that I agree with the content of this note as supervising physician." [place], [name]

## 2018-12-19 NOTE — Telephone Encounter (Signed)
Take extra strength tylenol

## 2018-12-19 NOTE — Telephone Encounter (Signed)
Patient complains of right shoulder pain, ongoing for the last four days, no known injury.  What do you recommend he takes for this?

## 2019-01-12 ENCOUNTER — Ambulatory Visit (INDEPENDENT_AMBULATORY_CARE_PROVIDER_SITE_OTHER): Payer: Medicare HMO | Admitting: Licensed Clinical Social Worker

## 2019-01-12 DIAGNOSIS — J439 Emphysema, unspecified: Secondary | ICD-10-CM | POA: Diagnosis not present

## 2019-01-12 DIAGNOSIS — I10 Essential (primary) hypertension: Secondary | ICD-10-CM

## 2019-01-12 DIAGNOSIS — I5032 Chronic diastolic (congestive) heart failure: Secondary | ICD-10-CM

## 2019-01-12 DIAGNOSIS — F0390 Unspecified dementia without behavioral disturbance: Secondary | ICD-10-CM | POA: Diagnosis not present

## 2019-01-12 DIAGNOSIS — N1832 Chronic kidney disease, stage 3b: Secondary | ICD-10-CM

## 2019-01-12 DIAGNOSIS — F411 Generalized anxiety disorder: Secondary | ICD-10-CM

## 2019-01-12 NOTE — Patient Instructions (Addendum)
Licensed Clinical Social Worker Visit Information  Goals we discussed today:  Goals        . "I want to make sure I can keep control of my anxiety"  (pt-stated)           Barrier: Mental Health concerns of client with chronic diagnoses of Dementia, CKD, COPD, Anxiety state, and HTN   Clinical Social Work Clinical Goal(s): Over the next 30 days client and family caregiver will verbalize understanding of long term plan for management of anxiety with panic attacks.    Interventions:  Talked with client about social support network of client(friends, son)  Encouraged client to talk with RNCM regarding nursing needs of client  Talked with client about pain issues in his right shoulder  Talked with client about his upcoming appointments  Talked with client about relaxation techniques (likes to have breakfast with friends several days weekly, plays guitar, enjoys visiting with family members  Patient Self Care Activities:  . Participates in morning breakfast gathering with friends. [name] with family. Sees son each Saturday.   .  Talks openly about his struggles with anxiety and medications taken for anxiety.   Plan:   Client to communicate with family members as needed to discuss ongoing client needs. Client to communicate with LCSW as needed to discuss psychosocial needs of client LCSW to call client in next 3 weeks to discuss client management of anxiety and panic symptoms Client to attend scheduled medical appointments Client to communicate with RN CM as needed to discuss nursing needs of client. Client to socialize with family or friends as a way to manage anxiety or stress symptoms          . "I want to talk with my son about advanced directive" (pt-stated)        Barrier: Mental Health concerns of client with chronic diagnoses of Dementia, CKD, COPD, Anxiety state, and HTN    Clinical Social Work Clinical Goal(s): Over the next 30 days, client will work  with LCSW to address concerns related to completion of HCPOA  Interventions: . LCSW spoke previously with client about completion of HCPOA . LCSW previously mailed, at client request, Advanced Directives booklet to son of client . LCSW encouraged client to talk with his son about completion of HCPOA  Patient Self Care Activities:  . Client talks with his son regarding medical decisions for client.  Plan:  . LCSW to call client in 3 weeks to discuss HCPOA completion with client.  . Client to call LCSW as needed to discuss psychosocial needs of client.  . Client to communicate with his son regarding completion of HCPOA for client  *initial goal documentation            Materials Provided:  No  Follow Up Plan: LCSW to call client in next 3 weeks to talk with client about client management of anxiety and panic symptoms faced  The patient verbalized understanding of instructions provided today and declined a print copy of patient instruction materials.   [name].Wave Calzada MSW, LCSW Licensed Clinical Social Worker [facility] [phone]

## 2019-01-12 NOTE — Chronic Care Management (AMB) (Addendum)
Care Management Note   Steven [name] is a 85 y.o. year old male who is a primary care patient of [name], [place]. The CM team was consulted for assistance with chronic disease management and care coordination.   I reached out to Bud Face Peace by phone today.     Review of patient status, including review of consultants reports, relevant laboratory and other test results, and collaboration with appropriate care team members and the patient's provider was performed as part of comprehensive patient evaluation and provision of chronic care management services.   Social determinants of health: risk of tobacco use; risk of social isolation; risk of stress ; risk of physical inactivity    Chronic Care Management from 04/08/2018 in [place]  PHQ-9 Total Score  5       GAD 7 : Generalized Anxiety Score 12/11/2018 05/08/2016  Nervous, Anxious, on Edge 1 1  Control/stop worrying 1 1  Worry too much - different things 1 0  Trouble relaxing 0 1  Restless 1 1  Easily annoyed or irritable 0 0  Afraid - awful might happen 0 0  Total GAD 7 Score 4 4  Anxiety Difficulty Not difficult at all Not difficult at all   Medications    albuterol (PROVENTIL) (2.5 MG/3ML) 0.083% nebulizer solution    aspirin 81 MG tablet    atorvastatin (LIPITOR) 10 MG tablet    clonazePAM (KLONOPIN) 1 MG tablet    diltiazem (CARDIZEM CD) 120 MG 24 hr capsule    doxazosin (CARDURA) 2 MG tablet    FERREX 150 150 MG capsule    furosemide (LASIX) 20 MG tablet    ipratropium-albuterol (DUONEB) 0.5-2.5 (3) MG/3ML SOLN    Goals      "I want to make sure I can keep control of my anxiety"  (pt-stated)      Barrier: Mental Health concerns of client with chronic diagnoses of Dementia, CKD, COPD, Anxiety state, and HTN  Clinical Social Work Clinical Goal(s): Over the next 30 days client and family caregiver will verbalize understanding of long term plan for management of anxiety with panic  attacks.    Interventions: Talked with client about social support network of client (friends, son) Encouraged client to talk with RNCM regarding nursing needs of client Talked with client about pain issues in his right shoulder Talked with client about his upcoming appointments Talked with client about relaxation techniques  (likes to have breakfast with friends several days weekly, plays guitar, enjoys visiting with family members  Patient Self Care Activities:  Participates in morning breakfast gathering with friends. Socializes with family. Sees son each Saturday.    Talks openly about his struggles with anxiety and medications taken for anxiety.   Plan:   Client to communicate with family members as needed to discuss ongoing client needs. Client to communicate with LCSW as needed to discuss psychosocial needs of client LCSW to call client in next 3 weeks to discuss client management of anxiety and panic symptoms Client to attend scheduled medical appointments Client to communicate with RN CM as needed to discuss nursing needs of client. Client to socialize with family or friends as a way to manage anxiety or stress symptoms          "I want to talk with my son about advanced directive" (pt-stated)     Current Barriers:  Mental Health Concerns of client with chronic diagnoses of HTN, anxiety state, COPD, CKD and Dementia  Clinical Social  Work Lawyer): Over the next 30 days, client will work with LCSW to address concerns related to completion of HCPOA  Interventions: LCSW previously spoke with client about completion of HCPOA LCSW previously mailed, at client request, Advanced Directives booklet to son of client LCSW previously encouraged client to talk with his son about completion of HCPOA  Patient Self Care Activities:  Client talks with his son regarding medical decisions for client.  Plan:  LCSW to call client in 3 weeks to discuss HCPOA completion with client.   Client to call LCSW as needed to discuss psychosocial needs of client.  Client to communicate with his son regarding completion of HCPOA for client  *initial goal documentation       Follow Up Plan: LCSW to call client in next 3 weeks to talk with client about his management of anxiety and panic symptoms.    [name].Reda Citron MSW, LCSW Licensed Clinical Social Worker [facility] [phone]  "I have reviewed this encounter including the documentation in this note and/or discussed this patient with [name] . I am certifying that I agree with the content of this note as supervising physician." [place], [place]   .

## 2019-01-26 ENCOUNTER — Ambulatory Visit: Payer: Self-pay | Admitting: Licensed Clinical Social Worker

## 2019-01-26 DIAGNOSIS — I10 Essential (primary) hypertension: Secondary | ICD-10-CM

## 2019-01-26 DIAGNOSIS — F411 Generalized anxiety disorder: Secondary | ICD-10-CM

## 2019-01-26 DIAGNOSIS — J439 Emphysema, unspecified: Secondary | ICD-10-CM

## 2019-01-26 DIAGNOSIS — N1832 Chronic kidney disease, stage 3b: Secondary | ICD-10-CM

## 2019-01-26 DIAGNOSIS — I5032 Chronic diastolic (congestive) heart failure: Secondary | ICD-10-CM

## 2019-01-26 DIAGNOSIS — E785 Hyperlipidemia, unspecified: Secondary | ICD-10-CM

## 2019-01-26 NOTE — Patient Instructions (Addendum)
Licensed Clinical Social Worker Visit Information  Goals we discussed today:  Goals Addressed            This Visit's Progress   . "I want to make sure I can keep control of my anxiety"  (pt-stated)        Barriers: Pain issues faced Mental Health issues in patient with Chronic Diagnoses of CHF, CKD, COPD,  HTN, Hyperlipidemia and Anxiety State  Clinical Social Work Clinical Goal(s): Over the next 30 days client and family caregiver will verbalize understanding of long term plan for management of anxiety with panic attacks.    Interventions:  Previously encouraged client to talk with RNCM regarding nursing needs of client  Talked with client about pain issues in his right shoulder  Talked with client about his upcoming appointments  Talked with client previously about relaxation techniques(likes to have breakfast with friends several days weekly, plays guitar, enjoys visiting with family members  Talked with Garald about his completion of daily ADLs.   Collaborated with [facility] Triage Nurse regarding nursing needs of client  Patient Self Care Activities:  . Participates in morning breakfast gathering with friends. [name] with family. Sees son each Saturday.   .  Talks openly about his struggles with anxiety and medications taken for anxiety.   Plan:   Client to communicate with family members as needed to discuss ongoing client needs. Client to communicate with LCSW as needed to discuss psychosocial needs of client LCSW to call client in next 4 weeks to discuss client management of anxiety and panic symptoms Client to attend scheduled medical appointments Client to communicate with RN CM as needed to discuss nursing needs of client. Client to socialize with family or friends as a way to manage anxiety or stress symptoms          Materials Provided: No  Follow Up Plan: LCSW to call client in next 4 weeks to discuss client management of anxiety and panic symptoms  faced  The patient verbalized understanding of instructions provided today and declined a print copy of patient instruction materials.   [name].Ruther Ephraim MSW, LCSW Licensed Clinical Social Worker [facility] [phone]

## 2019-01-26 NOTE — Chronic Care Management (AMB) (Addendum)
Care Management Note   Steven [name] is a 85 y.o. year old male who is a primary care patient of [name], [place]. The CM team was consulted for assistance with chronic disease management and care coordination.   I reached out to Bud Face Goldammer by phone today.    Review of patient status, including review of consultants reports, relevant laboratory and other test results, and collaboration with appropriate care team members and the patient's provider was performed as part of comprehensive patient evaluation and provision of chronic care management services.   Social determinants of health: Risk of tobacco use; risk of depression    Chronic Care Management from 04/08/2018 in [place]  PHQ-9 Total Score  5      GAD 7 : Generalized Anxiety Score 12/11/2018 05/08/2016  Nervous, Anxious, on Edge 1 1  Control/stop worrying 1 1  Worry too much - different things 1 0  Trouble relaxing 0 1  Restless 1 1  Easily annoyed or irritable 0 0  Afraid - awful might happen 0 0  Total GAD 7 Score 4 4  Anxiety Difficulty Not difficult at all Not difficult at all    Medications    albuterol (PROVENTIL) (2.5 MG/3ML) 0.083% nebulizer solution aspirin 81 MG tablet atorvastatin (LIPITOR) 10 MG tablet clonazePAM (KLONOPIN) 1 MG tablet diltiazem (CARDIZEM CD) 120 MG 24 hr capsule doxazosin (CARDURA) 2 MG tablet FERREX 150 150 MG capsule furosemide (LASIX) 20 MG tablet ipratropium-albuterol (DUONEB) 0.5-2.5 (3) MG/3ML SOLN    Goals Addressed             This Visit's Progress    "I want to make sure I can keep control of my anxiety"  (pt-stated)        Barriers: Pain issues faced Mental Health issues in patient with Chronic Diagnoses of CHF, CKD, COPD,  HTN, Hyperlipidemia and Anxiety State  Clinical Social Work Clinical Goal(s): Over the next 30 days client and family caregiver will verbalize understanding of long term plan for management of anxiety with  panic attacks.    Interventions: Previously encouraged client to talk with RNCM regarding nursing needs of client Talked with client about pain issues in his right shoulder Talked with client about his upcoming appointments Talked with client previously about relaxation techniques  (likes to have breakfast with friends several days weekly, plays guitar, enjoys visiting with family members Talked with Dimitrious about his completion of daily ADLs. Collaborated with [facility] Triage Nurse regarding nursing needs of client  Patient Self Care Activities:  Participates in morning breakfast gathering with friends. Socializes with family. Sees son each Saturday.    Talks openly about his struggles with anxiety and medications taken for anxiety.   Plan:   Client to communicate with family members as needed to discuss ongoing client needs. Client to communicate with LCSW as needed to discuss psychosocial needs of client LCSW to call client in next 4 weeks to discuss client management of anxiety and panic symptoms Client to attend scheduled medical appointments Client to communicate with RN CM as needed to discuss nursing needs of client. Client to socialize with family or friends as a way to manage anxiety or stress symptoms        Follow Up Plan: LCSW to call client in next 4 weeks to talk with client about his management of anxiety and panic symptoms  [name].Slevin Gunby MSW, LCSW Licensed Clinical Social Worker [facility] [phone]  "I have  reviewed this encounter including the documentation in this note and/or discussed this patient with [name] . I am certifying that I agree with the content of this note as supervising physician." [place], [name]

## 2019-02-11 ENCOUNTER — Other Ambulatory Visit: Payer: Self-pay

## 2019-02-11 ENCOUNTER — Ambulatory Visit (INDEPENDENT_AMBULATORY_CARE_PROVIDER_SITE_OTHER): Payer: Medicare HMO | Admitting: Family Medicine

## 2019-02-11 VITALS — BP 138/58 | HR 79 | Temp 98.9°F | Ht 67.0 in | Wt 132.0 lb

## 2019-02-11 DIAGNOSIS — M7501 Adhesive capsulitis of right shoulder: Secondary | ICD-10-CM

## 2019-02-11 DIAGNOSIS — M7551 Bursitis of right shoulder: Secondary | ICD-10-CM

## 2019-02-11 NOTE — Patient Instructions (Signed)
I have a high suspicion that you have something called a shoulder bursitis.  I think that this is what that soft tissue lump on the front of your shoulder is.  A lot of times when people have a shoulder injury they will guarded and try and not use it.  Subsequently they will develop something called frozen shoulder or adhesive capsulitis.  This is when scar tissue forms in the shoulder.  I think that you would benefit from a shot of your shoulder and imaging like an ultrasound of your shoulder.  This can be done with the orthopedist in [place].  You let me know if you decide you want to see him.  In the meantime, I am giving you physical therapy to do at home and I am recommending that you pick up a gel called Voltaren gel.  You can apply this gel to your shoulder 4 times a day if you need to for pain.

## 2019-02-11 NOTE — Progress Notes (Signed)
Subjective: CC: Right shoulder pain PCP: [name], [name] ZOX:[id number] D Affinito is a 86 y.o. male presenting to clinic today for:  1.  Right shoulder pain Patient reports a 35-month history of right-sided shoulder pain.  He denies any preceding injury.  He notes that he cannot raise his arm above shoulder level.  Denies any sensation changes or sensation of weakness.  He tried applying various muscle rubs without any improvement in symptoms.  He notes that there is a palpable knot on the anterior aspect of the shoulder that was there prior to onset of above pain   ROS: Per HPI  No Known Allergies Past Medical History:  Diagnosis Date  . Anxiety   . Cancer [facility]) 2013   Lung - cancer free x3 years.   . Hyperlipidemia   . Hypertension   . Myocardial infarction ([place]) 1999    Current Outpatient Medications:  .  albuterol (PROVENTIL) (2.5 MG/3ML) 0.083% nebulizer solution, Take 3 mLs (2.5 mg total) by nebulization every 2 (two) hours as needed for wheezing or shortness of breath., Disp: 75 mL, Rfl: 12 .  aspirin 81 MG tablet, Take 81 mg by mouth daily. , Disp: , Rfl:  .  atorvastatin (LIPITOR) 10 MG tablet, TAKE (1) TABLET BY MOUTH ONCE DAILY., Disp: 28 tablet, Rfl: 2 .  clonazePAM (KLONOPIN) 1 MG tablet, Take 1 tablet (1 mg total) by mouth 3 (three) times daily as needed for anxiety., Disp: 90 tablet, Rfl: 4 .  diltiazem (CARDIZEM CD) 120 MG 24 hr capsule, TAKE 1 CAPSULE BY MOUTH ONCE A DAY., Disp: 28 capsule, Rfl: 2 .  doxazosin (CARDURA) 2 MG tablet, TAKE (1) TABLET BY MOUTH ONCE DAILY., Disp: 28 tablet, Rfl: 2 .  FERREX 150 150 MG capsule, TAKE 1 CAPSULE BY MOUTH ONCE A DAY., Disp: 28 capsule, Rfl: 11 .  furosemide (LASIX) 20 MG tablet, TAKE 1 TABLET BY MOUTH EVERY OTHER DAY., Disp: 14 tablet, Rfl: 2 .  ipratropium-albuterol (DUONEB) 0.5-2.5 (3) MG/3ML SOLN, Take 3 mLs by nebulization 3 (three) times daily., Disp: 360 mL, Rfl:  Social History   Socioeconomic History  .  Marital status: Divorced    Spouse name: Not on file  . Number of children: 2  . Years of education: 6  . Highest education level: 6th grade  Occupational History  . Occupation: Retired    Comment: Charity fundraiser  Tobacco Use  . Smoking status: Former Smoker    Packs/day: 2.00    Years: 73.00    Pack years: 146.00    Types: Cigarettes    Start date: 02/05/2006    Quit date: 11/05/2006    Years since quitting: 12.2  . Smokeless tobacco: Current User  . Tobacco comment: uses camel patches  Substance and Sexual Activity  . Alcohol use: No  . Drug use: No  . Sexual activity: Not Currently  Other Topics Concern  . Not on file  Social History Narrative  . Not on file   Social Determinants of Health   Financial Resource Strain:   . Difficulty of Paying Living Expenses: Not on file  Food Insecurity:   . Worried About Charity fundraiser in the Last Year: Not on file  . Ran Out of Food in the Last Year: Not on file  Transportation Needs:   . Lack of Transportation (Medical): Not on file  . Lack of Transportation (Non-Medical): Not on file  Physical Activity:   . Days of Exercise per Week: Not on file  .  Minutes of Exercise per Session: Not on file  Stress:   . Feeling of Stress : Not on file  Social Connections:   . Frequency of Communication with Friends and Family: Not on file  . Frequency of Social Gatherings with Friends and Family: Not on file  . Attends Religious Services: Not on file  . Active Member of Clubs or Organizations: Not on file  . Attends Archivist Meetings: Not on file  . Marital Status: Not on file  Intimate Partner Violence:   . Fear of Current or Ex-Partner: Not on file  . Emotionally Abused: Not on file  . Physically Abused: Not on file  . Sexually Abused: Not on file   Family History  Problem Relation Age of Onset  . Cancer Mother        pancreatic  . Pulmonary embolism Father   . Anxiety disorder Other     Objective: Office vital  signs reviewed. BP (!) 138/58   Pulse 79   Temp 98.9 F (37.2 C) (Temporal)   Ht 5\' 7"  (1.702 m)   Wt 132 lb (59.9 kg)   SpO2 96%   BMI 20.67 kg/m   Physical Examination:  General: Awake, alert, No acute distress MSK: Patient with a grape sized soft tissue swelling noted along the [facility] [place] joint on the right.  There is otherwise no palpable deformity.  He is unable to actively raise his right upper extremity above 90 degrees and passively we cannot get it much above 95 degrees without significant pain.  He does have pain with Hawkins test.  He has weakness with empty can. Neuro: Light touch sensation appears grossly intact  Assessment/ Plan: 86 y.o. male   1. Adhesive capsulitis of right shoulder I suspect that the pain that he is experiencing now is adhesive capsulitis related to what appears to be a bursitis.  I have recommended that he actually seek evaluation of the shoulder under ultrasound with orthopedics but he would like to hold off on this for now as he is dependent upon his family members for transportation.  In the interim, I have recommended that he start home physical therapy exercises to improve range of motion.  A handout was reviewed and provided to the patient today.  For the bursitis and generalized shoulder pain I have recommended topical Voltaren applied 4 times daily as a his history of CHF and renal disease does not support use of oral NSAIDs.  Additionally, opioid pain medication was not considered for this patient who is on chronic benzodiazepines.  He will contact me should he decide to proceed with orthopedic evaluation, which I suspect would be in [place], since this is most convenient for him.  He otherwise will follow up with his PCP for ongoing needs  2. Chronic shoulder bursitis, right   No orders of the defined types were placed in this encounter.  No orders of the defined types were placed in this encounter.    Janora Norlander, DO [place] [phone]

## 2019-02-13 ENCOUNTER — Ambulatory Visit: Payer: Medicare HMO | Admitting: Family Medicine

## 2019-02-18 ENCOUNTER — Ambulatory Visit: Payer: Medicare HMO

## 2019-02-24 ENCOUNTER — Telehealth: Payer: Self-pay

## 2019-02-26 ENCOUNTER — Telehealth: Payer: Self-pay | Admitting: *Deleted

## 2019-02-26 ENCOUNTER — Ambulatory Visit (INDEPENDENT_AMBULATORY_CARE_PROVIDER_SITE_OTHER): Payer: Medicare HMO | Admitting: Licensed Clinical Social Worker

## 2019-02-26 DIAGNOSIS — E785 Hyperlipidemia, unspecified: Secondary | ICD-10-CM | POA: Diagnosis not present

## 2019-02-26 DIAGNOSIS — F411 Generalized anxiety disorder: Secondary | ICD-10-CM

## 2019-02-26 DIAGNOSIS — I5032 Chronic diastolic (congestive) heart failure: Secondary | ICD-10-CM | POA: Diagnosis not present

## 2019-02-26 DIAGNOSIS — N1832 Chronic kidney disease, stage 3b: Secondary | ICD-10-CM

## 2019-02-26 DIAGNOSIS — J439 Emphysema, unspecified: Secondary | ICD-10-CM

## 2019-02-26 DIAGNOSIS — I10 Essential (primary) hypertension: Secondary | ICD-10-CM

## 2019-02-26 NOTE — Telephone Encounter (Signed)
Steven [name] spoke with patient today and he was complaining with right shoulder pain and was requesting a shot. Patient called and I tried to get patient to schedule an appointment patient states he wants to wait a couple more weeks and if it gets worse he will give [place] a call to schedule.

## 2019-02-26 NOTE — Patient Instructions (Addendum)
Licensed Clinical Social Worker Visit Information  Goals we discussed today:   Goals        . "I want to make sure I can keep control of my anxiety"  (pt-stated)      Barriers: Pain issues faced Mental Health issues in patient with Chronic Diagnoses of CHF, CKD, COPD,  HTN, Hyperlipidemia and Anxiety State  Clinical Social Work Clinical Goal(s): Over the next 30 days client and family caregiver will verbalize understanding of long term plan for management of anxiety with panic attacks.    Interventions:  Previously encouraged client to talk with RNCM regarding nursing needs of client  Talked with client about pain issues in his right shoulder  Talked with client about his upcoming appointments  Talked with clientabout relaxation techniques(likes to have breakfast with friends several days weekly, plays guitar, enjoys visiting with family members  Talked with Kadeen about his completion of daily ADLs.  Collaborated with [facility] Triage Nurse to discuss nursing needs of client  Patient Self Care Activities:  . Participates in morning breakfast gathering with friends. [name] with family. Sees son each Saturday.   .  Talks openly about his struggles with anxiety and medications taken for anxiety.   Plan:   Client to communicate with family members as needed to discuss ongoing client needs. Client to communicate with LCSW as needed to discuss psychosocial needs of client LCSW to call client in next 3 weeks to discuss client management of anxiety and panic symptoms Client to attend scheduled medical appointments Client to communicate with RN CM as needed to discuss nursing needs of client. Client to socialize with family or friends as a way to manage anxiety or stress symptoms          . "I want to talk with my son about advanced directive" (pt-stated)     Current Barriers:  [name] Mental Health Concerns   Clinical Social Work Clinical Goal(s): Over the next 30 days, client  will work with LCSW to address concerns related to completion of HCPOA  Interventions: . LCSW spoke with client previously about completion of HCPOA . Previously LCSW mailed, at client request, Advanced Directives booklet to son of client . LCSW encouraged client previously to talk with his son about completion of HCPOA  Patient Self Care Activities:  . Client talks with his son regarding medical decisions for client.  Plan:  . LCSW to call client in 3 weeks to discuss HCPOA completion with client.  . Client to call LCSW as needed to discuss psychosocial needs of client.  . Client to communicate with his son regarding completion of HCPOA for client  *initial goal documentation       Materials Provided: No  Follow Up Plan: LCSW to call client in next 4 weeks to talk with client about his management of anxiety and panic symptoms faced  The patient verbalized understanding of instructions provided today and declined a print copy of patient instruction materials.   [name].Brooklynne Pereida MSW, LCSW Licensed Clinical Social Worker [facility] [phone]

## 2019-02-26 NOTE — Chronic Care Management (AMB) (Signed)
Care Management Note   Steven [name] is a 86 y.o. year old male who is a primary care patient of [name], [place]. The CM team was consulted for assistance with chronic disease management and care coordination.   I reached out to Steven [name] by phone today.     Review of patient status, including review of consultants reports, relevant laboratory and other test results, and collaboration with appropriate care team members and the patient's provider was performed as part of comprehensive patient evaluation and provision of chronic care management services.   Social determinants of health: risk of tobacco use; risk of depression    Office Visit from 02/11/2019 in [facility]  PHQ-9 Total Score  5     GAD 7 : Generalized Anxiety Score 12/11/2018 05/08/2016  Nervous, Anxious, on Edge 1 1  Control/stop worrying 1 1  Worry too much - different things 1 0  Trouble relaxing 0 1  Restless 1 1  Easily annoyed or irritable 0 0  Afraid - awful might happen 0 0  Total GAD 7 Score 4 4  Anxiety Difficulty Not difficult at all Not difficult at all   Medications   albuterol (PROVENTIL) (2.5 MG/3ML) 0.083% nebulizer solution aspirin 81 MG tablet atorvastatin (LIPITOR) 10 MG tablet clonazePAM (KLONOPIN) 1 MG tablet diltiazem (CARDIZEM CD) 120 MG 24 hr capsule doxazosin (CARDURA) 2 MG tablet FERREX 150 150 MG capsule furosemide (LASIX) 20 MG tablet ipratropium-albuterol (DUONEB) 0.5-2.5 (3) MG/3ML SOLN  Goals        . "I want to make sure I can keep control of my anxiety"  (pt-stated)      Barriers: Pain issues faced Mental Health issues in patient with Chronic Diagnoses of CHF, CKD, COPD,  HTN, Hyperlipidemia and Anxiety State  Clinical Social Work Clinical Goal(s): Over the next 30 days client and family caregiver will verbalize understanding of long term plan for management of anxiety with panic attacks.    Interventions:  Previously encouraged  client to talk with RNCM regarding nursing needs of client  Talked with client about pain issues in his right shoulder  Talked with client about his upcoming appointments  Talked with client about relaxation techniques(likes to have breakfast with friends several days weekly, plays guitar, enjoys visiting with family members  Talked with Steven [name] about his completion of daily ADLs.  Collaborated with [facility] Triage Nurse to discuss nursing needs of client  Patient Self Care Activities:  . Participates in morning breakfast gathering with friends. Steven [name] with family. Sees son each Saturday.   .  Talks openly about his struggles with anxiety and medications taken for anxiety.   Plan:   Client to communicate with family members as needed to discuss ongoing client needs. Client to communicate with LCSW as needed to discuss psychosocial needs of client LCSW to call client in next 4 weeks to discuss client management of anxiety and panic symptoms Client to attend scheduled medical appointments Client to communicate with RN CM as needed to discuss nursing needs of client. Client to socialize with family or friends as a way to manage anxiety or stress symptoms      . "I want to talk with my son about advanced directive" (pt-stated)     Current Barriers:  [name] Mental Health Concerns   Clinical Social Work Clinical Goal(s): Over the next 30 days, client will work with LCSW to address concerns related to completion of HCPOA  Interventions: . LCSW spoke with client  previously about completion of HCPOA . LCSW mailed, at client request, Advanced Directives booklet to son of client . LCSW encouraged client previously to talk with his son about completion of HCPOA  Patient Self Care Activities:  . Client talks with his son regarding medical decisions for client.  Plan:  . LCSW to call client in 4 weeks to discuss HCPOA completion with client.  . Client to call LCSW as needed to discuss  psychosocial needs of client.  . Client to communicate with his son regarding completion of HCPOA for client  *initial goal documentation     Follow Up Plan: LCSW to call client in next 4 weeks to talk with client about his management of anxiety and panic symptoms faced  Steven [name] MSW, LCSW Licensed Clinical Social Worker [facility] Care Management [phone]

## 2019-03-20 ENCOUNTER — Other Ambulatory Visit: Payer: Self-pay | Admitting: Nurse Practitioner

## 2019-03-20 DIAGNOSIS — I10 Essential (primary) hypertension: Secondary | ICD-10-CM

## 2019-03-20 DIAGNOSIS — E785 Hyperlipidemia, unspecified: Secondary | ICD-10-CM

## 2019-03-20 DIAGNOSIS — N400 Enlarged prostate without lower urinary tract symptoms: Secondary | ICD-10-CM

## 2019-03-20 DIAGNOSIS — I5032 Chronic diastolic (congestive) heart failure: Secondary | ICD-10-CM

## 2019-03-26 ENCOUNTER — Ambulatory Visit (INDEPENDENT_AMBULATORY_CARE_PROVIDER_SITE_OTHER): Payer: Medicare HMO | Admitting: Licensed Clinical Social Worker

## 2019-03-26 DIAGNOSIS — I5032 Chronic diastolic (congestive) heart failure: Secondary | ICD-10-CM

## 2019-03-26 DIAGNOSIS — J439 Emphysema, unspecified: Secondary | ICD-10-CM | POA: Diagnosis not present

## 2019-03-26 DIAGNOSIS — I10 Essential (primary) hypertension: Secondary | ICD-10-CM

## 2019-03-26 DIAGNOSIS — E785 Hyperlipidemia, unspecified: Secondary | ICD-10-CM | POA: Diagnosis not present

## 2019-03-26 DIAGNOSIS — F411 Generalized anxiety disorder: Secondary | ICD-10-CM

## 2019-03-26 DIAGNOSIS — N1832 Chronic kidney disease, stage 3b: Secondary | ICD-10-CM

## 2019-03-26 NOTE — Patient Instructions (Addendum)
Licensed Clinical Social Worker Visit Information  Goals we discussed today:  Goals        . "I want to make sure I can keep control of my anxiety"  (pt-stated)      Barriers: Pain issues faced Mental Health issues in patient with Chronic Diagnoses of CHF, CKD, COPD,  HTN, Hyperlipidemia and Anxiety State  Clinical Social Work Clinical Goal(s): Over the next 30 days client and family caregiver will verbalize understanding of long term plan for management of anxiety with panic attacks.    Interventions: Previously encouraged client to talk with RNCM regarding nursing needs of client Talked with client about pain issues in his right shoulder  Talked with client about his upcoming appointments  Talked with client about relaxation techniques (likes to have breakfast with friends several days weekly, plays guitar, enjoys visiting with family members  Previously talked with [name] about his completion of daily ADLs.  Talked with [name] about ambulation challenges  Talked with Nazair about social support network  Patient Self Care Activities:  . Participates in morning breakfast gathering with friends. [name] with family. Sees son each Saturday.   .  Talks openly about his struggles with anxiety and medications taken for anxiety.   Plan:   Client to communicate with family members as needed to discuss ongoing client needs. Client to communicate with LCSW as needed to discuss psychosocial needs of client LCSW to call client in next 4 weeks to discuss client management of anxiety and panic symptoms Client to attend scheduled medical appointments Client to communicate with RN CM as needed to discuss nursing needs of client. Client to socialize with family or friends as a way to manage anxiety or stress symptoms              Materials Provided: No  Follow Up Plan: LCSW to call client in next 4 weeks to discuss client management of anxiety and panic symptoms  The patient verbalized  understanding of instructions provided today and declined a print copy of patient instruction materials.   [name].Lizanne Erker MSW, LCSW Licensed Clinical Social Worker [facility] Care [facility] [phone]

## 2019-03-26 NOTE — Chronic Care Management (AMB) (Signed)
  Care Management Note   Steven [name] is a 86 y.o. year old male who is a primary care patient of [name], [place]. The CM team was consulted for assistance with chronic disease management and care coordination.   I reached out to Bud Face Tagliaferro by phone today.    Review of patient status, including review of consultants reports, relevant laboratory and other test results, and collaboration with appropriate care team members and the patient's provider was performed as part of comprehensive patient evaluation and provision of chronic care management services.   Social determinants of health: risk of tobacco use; risk of depression    Office Visit from 02/11/2019 in [facility]  PHQ-9 Total Score  5     GAD 7 : Generalized Anxiety Score 12/11/2018 05/08/2016  Nervous, Anxious, on Edge 1 1  Control/stop worrying 1 1  Worry too much - different things 1 0  Trouble relaxing 0 1  Restless 1 1  Easily annoyed or irritable 0 0  Afraid - awful might happen 0 0  Total GAD 7 Score 4 4  Anxiety Difficulty Not difficult at all Not difficult at all   Medications   albuterol (PROVENTIL) (2.5 MG/3ML) 0.083% nebulizer solution aspirin 81 MG tablet atorvastatin (LIPITOR) 10 MG tablet clonazePAM (KLONOPIN) 1 MG tablet diltiazem (CARDIZEM CD) 120 MG 24 hr capsule doxazosin (CARDURA) 2 MG tablet FERREX 150 150 MG capsule furosemide (LASIX) 20 MG tablet ipratropium-albuterol (DUONEB) 0.5-2.5 (3) MG/3ML SOLN  Goals        . "I want to make sure I can keep control of my anxiety"  (pt-stated)      Barriers: Pain issues faced Mental Health issues in patient with Chronic Diagnoses of CHF, CKD, COPD,  HTN, Hyperlipidemia and Anxiety State  Clinical Social Work Clinical Goal(s): Over the next 30 days client and family caregiver will verbalize understanding of long term plan for management of anxiety with panic attacks.    Interventions:  Previously encouraged client  to talk with RNCM regarding nursing needs of client  Talked with client about pain issues in his right shoulder  Talked with client about his upcoming appointments  Talked with clientabout relaxation techniques(likes to have breakfast with friends several days weekly, plays guitar, enjoys visiting with family members  Previously talked with [name] about his completion of daily ADLs.  Talked with [name] about ambulation challenges  Talked with Pharrell about social support network  Patient Self Care Activities:  . Participates in morning breakfast gathering with friends. [name] with family. Sees son each Saturday.   .  Talks openly about his struggles with anxiety and medications taken for anxiety.   Plan:   Client to communicate with family members as needed to discuss ongoing client needs. Client to communicate with LCSW as needed to discuss psychosocial needs of client LCSW to call client in next 4 weeks to discuss client management of anxiety and panic symptoms Client to attend scheduled medical appointments Client to communicate with RN CM as needed to discuss nursing needs of client. Client to socialize with family or friends as a way to manage anxiety or stress symptoms        Follow Up Plan: LCSW to call client in next 4 weeks to talk with client about his management of anxiety and panic symptoms  [name].Kirsti Mcalpine MSW, LCSW Licensed Clinical Social Worker [facility] Care [facility] [phone]

## 2019-04-21 ENCOUNTER — Other Ambulatory Visit: Payer: Self-pay | Admitting: Nurse Practitioner

## 2019-04-21 DIAGNOSIS — F411 Generalized anxiety disorder: Secondary | ICD-10-CM

## 2019-04-22 ENCOUNTER — Ambulatory Visit (INDEPENDENT_AMBULATORY_CARE_PROVIDER_SITE_OTHER): Payer: Medicare HMO | Admitting: Licensed Clinical Social Worker

## 2019-04-22 DIAGNOSIS — J439 Emphysema, unspecified: Secondary | ICD-10-CM

## 2019-04-22 DIAGNOSIS — E785 Hyperlipidemia, unspecified: Secondary | ICD-10-CM

## 2019-04-22 DIAGNOSIS — I5032 Chronic diastolic (congestive) heart failure: Secondary | ICD-10-CM | POA: Diagnosis not present

## 2019-04-22 DIAGNOSIS — F411 Generalized anxiety disorder: Secondary | ICD-10-CM

## 2019-04-22 DIAGNOSIS — N1832 Chronic kidney disease, stage 3b: Secondary | ICD-10-CM

## 2019-04-22 DIAGNOSIS — I2510 Atherosclerotic heart disease of native coronary artery without angina pectoris: Secondary | ICD-10-CM | POA: Diagnosis not present

## 2019-04-22 NOTE — Telephone Encounter (Signed)
Last office visit 12/11/2018, told to follow up in 6 months Last refill 12/11/2018, #90, 4 refills

## 2019-04-22 NOTE — Patient Instructions (Addendum)
Licensed Clinical Social Worker Visit Information  Goals we discussed today:  Goals        . "I want to make sure I can keep control of my anxiety"  (pt-stated)      Barriers: Pain issues faced Mental Health issues in patient with Chronic Diagnoses of CHF, CKD, COPD,  HTN, Hyperlipidemia and Anxiety State  Clinical Social Work Clinical Goal(s): Over the next 30 days client and family caregiver will verbalize understanding of long term plan for management of anxiety with panic attacks.    Interventions: Previously encouraged client to talk with RNCM regarding nursing needs of client Talked with client about pain issues in his right shoulder  Talked with client about his upcoming appointments  Talked with client about relaxation techniques (likes to have breakfast with friends several days weekly, plays guitar, enjoys visiting with family members  Talked with Steven [name] about his completion of daily ADLs.  Talked with Steven [name] about ambulation challenges (client said he often has pain in his lower extremities when walking)  Talked with Steven [name] about social support network  Patient Self Care Activities:  . Participates in morning breakfast gathering with friends. Steven [name] with family. Sees son each Saturday.   .  Talks openly about his struggles with anxiety and medications taken for anxiety.   Plan:   Client to communicate with family members as needed to discuss ongoing client needs. Client to communicate with LCSW as needed to discuss psychosocial needs of client LCSW to call client in next 4 weeks to discuss client management of anxiety and panic symptoms Client to attend scheduled medical appointments Client to communicate with RN CM as needed to discuss nursing needs of client. Client to socialize with family or friends as a way to manage anxiety or stress symptoms     Materials Provided: No  Follow Up Plan: LCSW to call client in next 4 weeks to discuss client management of  anxiety and panic symptoms  The patient verbalized understanding of instructions provided today and declined a print copy of patient instruction materials.   Steven [name].Steven [name] MSW, LCSW Licensed Clinical Social Worker [facility] [phone]

## 2019-04-22 NOTE — Chronic Care Management (AMB) (Signed)
  Care Management Note   Steven [name] is a 86 y.o. year old male who is a primary care patient of Steven [name], [place]. The CM team was consulted for assistance with chronic disease management and care coordination.   I reached out to Steven [name] by phone today.   Review of patient status, including review of consultants reports, relevant laboratory and other test results, and collaboration with appropriate care team members and the patient's provider was performed as part of comprehensive patient evaluation and provision of chronic care management services.   Social determinants of health: risk of tobacco use; risk of depression    Office Visit from 02/11/2019 in [facility]  PHQ-9 Total Score  5     GAD 7 : Generalized Anxiety Score 12/11/2018 05/08/2016  Nervous, Anxious, on Edge 1 1  Control/stop worrying 1 1  Worry too much - different things 1 0  Trouble relaxing 0 1  Restless 1 1  Easily annoyed or irritable 0 0  Afraid - awful might happen 0 0  Total GAD 7 Score 4 4  Anxiety Difficulty Not difficult at all Not difficult at all   Medications   albuterol (PROVENTIL) (2.5 MG/3ML) 0.083% nebulizer solution aspirin 81 MG tablet atorvastatin (LIPITOR) 10 MG tablet clonazePAM (KLONOPIN) 1 MG tablet diltiazem (CARDIZEM CD) 120 MG 24 hr capsule doxazosin (CARDURA) 2 MG tablet FERREX 150 150 MG capsule furosemide (LASIX) 20 MG tablet ipratropium-albuterol (DUONEB) 0.5-2.5 (3) MG/3ML SOLN  Goals        . "I want to make sure I can keep control of my anxiety"  (pt-stated)      Barriers: Pain issues faced Mental Health issues in patient with Chronic Diagnoses of CHF, CKD, COPD,  HTN, Hyperlipidemia and Anxiety State  Clinical Social Work Clinical Goal(s): Over the next 30 days client and family caregiver will verbalize understanding of long term plan for management of anxiety with panic attacks.    Interventions:  Previously encouraged client  to talk with RNCM regarding nursing needs of client  Talked with client about pain issues in his right shoulder  Talked with client about his upcoming appointments  Talked with clientabout relaxation techniques(likes to have breakfast with friends several days weekly, plays guitar, enjoys visiting with family members  Talked with Steven [name] about his completion of daily ADLs.  Talked with Steven [name] about ambulation challenges (client said he often has pain in his lower extremities when walking)  Talked with Steven [name] about social support network  Patient Self Care Activities:  . Participates in morning breakfast gathering with friends. Steven [name] with family. Sees son each Saturday.   .  Talks openly about his struggles with anxiety and medications taken for anxiety.   Plan:   Client to communicate with family members as needed to discuss ongoing client needs. Client to communicate with LCSW as needed to discuss psychosocial needs of client LCSW to call client in next 4 weeks to discuss client management of anxiety and panic symptoms Client to attend scheduled medical appointments Client to communicate with RN CM as needed to discuss nursing needs of client. Client to socialize with family or friends as a way to manage anxiety or stress symptoms    Follow Up Plan:  LCSW to call client in next 4 weeks to talk about client management of anxiety and panic symptoms  Steven [name].Steven [name] MSW, LCSW Licensed Clinical Social Worker [facility] Care [facility] [phone]

## 2019-05-05 ENCOUNTER — Ambulatory Visit: Payer: Medicare HMO | Admitting: *Deleted

## 2019-05-05 DIAGNOSIS — I5032 Chronic diastolic (congestive) heart failure: Secondary | ICD-10-CM | POA: Diagnosis not present

## 2019-05-05 DIAGNOSIS — I2510 Atherosclerotic heart disease of native coronary artery without angina pectoris: Secondary | ICD-10-CM

## 2019-05-05 DIAGNOSIS — M79605 Pain in left leg: Secondary | ICD-10-CM

## 2019-05-05 DIAGNOSIS — J439 Emphysema, unspecified: Secondary | ICD-10-CM

## 2019-05-05 DIAGNOSIS — M79604 Pain in right leg: Secondary | ICD-10-CM

## 2019-05-05 DIAGNOSIS — E785 Hyperlipidemia, unspecified: Secondary | ICD-10-CM | POA: Diagnosis not present

## 2019-05-05 NOTE — Patient Instructions (Signed)
Visit Information  Goals Addressed            This Visit's Progress     Patient Stated   . "I want to find out why my legs hurt when I walk" (pt-stated)       Lower extremity pain in patient with CAD, CHF, and CKD  Current Barriers:  [name] Knowledge Deficits related to cause of bilateral LE leg pain during ambulation . Cognitive Deficits  Nurse Case Manager Clinical Goal(s):  [name] Over the next 30 days, patient will verbalize understanding of plan for bilateral lower leg pain (suspected claudication) . Over the next 15 days, patient will see PCP regarding lower extremity pain  Interventions:  . Discussed history of present illness with patient o Bilateral lower leg pain for greater than 6 months o Pain is only present with ambulation and resolves during rest . Previously offered patient appointment with PCP but he declined at that time. . Scheduled patient an appointment with PCP for 05/15/19 and he is agreeable to that . Encouraged patient to reach out to PCP sooner if he develops any new or worsening symptoms . Encouraged patient to reach out to [facility] team as needed  Patient Self Care Activities:  . Performs ADL's independently . Has help as needed from his son  Please see past updates related to this goal by clicking on the "Past Updates" button in the selected goal         The patient verbalized understanding of instructions provided today and declined a print copy of patient instruction materials.   Follow-up Plan The care management team will reach out to the patient again over the next 45 days.   Chong Sicilian, BSN, RN-BC Embedded Chronic Care Manager [facility] / [facility] Management Direct Dial: [phone]

## 2019-05-05 NOTE — Chronic Care Management (AMB) (Signed)
Chronic Care Management   Follow Up Note   05/05/2019 Name: Steven [name] MRN: [id number] DOB: [birth year]  Referred by: [name], [name] Reason for referral : Chronic Care Management (RN follow up)   Steven [name] is a 86 y.o. year old male who is a primary care patient of [name], [place]. The CCM team was consulted for assistance with chronic disease management and care coordination needs.    Review of patient status, including review of consultants reports, relevant laboratory and other test results, and collaboration with appropriate care team members and the patient's provider was performed as part of comprehensive patient evaluation and provision of chronic care management services.    I spoke with Steven [name] by telephone today. He has ongoing BLE pain that is only present with ambulation. Symptoms have not changed or worsened since onset. He reports that otherwise he feels great and that he's doing much better for his age than people expect.   SDOH (Social Determinants of Health) assessments performed: No See Care Plan activities for detailed interventions related to [facility])     Outpatient Encounter Medications as of 05/05/2019  Medication Sig Note  . albuterol (PROVENTIL) (2.5 MG/3ML) 0.083% nebulizer solution Take 3 mLs (2.5 mg total) by nebulization every 2 (two) hours as needed for wheezing or shortness of breath. 04/17/2016: PRN Only  . aspirin 81 MG tablet Take 81 mg by mouth daily.  08/04/2015: Received from: External Pharmacy  . atorvastatin (LIPITOR) 10 MG tablet TAKE (1) TABLET BY MOUTH ONCE DAILY.   . clonazePAM (KLONOPIN) 1 MG tablet Take 1 tablet (1 mg total) by mouth 3 (three) times daily as needed for anxiety.   [name] diltiazem (CARDIZEM CD) 120 MG 24 hr capsule TAKE 1 CAPSULE BY MOUTH ONCE A DAY.   [name] doxazosin (CARDURA) 2 MG tablet TAKE (1) TABLET BY MOUTH ONCE DAILY.   [name] FERREX 150 150 MG capsule TAKE 1 CAPSULE BY MOUTH ONCE A DAY.   . furosemide  (LASIX) 20 MG tablet TAKE 1 TABLET BY MOUTH EVERY OTHER DAY.   [name] ipratropium-albuterol (DUONEB) 0.5-2.5 (3) MG/3ML SOLN Take 3 mLs by nebulization 3 (three) times daily. 12/31/2017: Patient reports infrequent use; does not know when he received his nebulizer; no DME maintenance being performed   No facility-administered encounter medications on file as of 05/05/2019.     RN Care Plan   . "I want to find out why my legs hurt when I walk" (pt-stated)       Lower extremity pain in patient with CAD, CHF, and CKD  Current Barriers:  [name] Knowledge Deficits related to cause of bilateral LE leg pain during ambulation . Cognitive Deficits  Nurse Case Manager Clinical Goal(s):  [name] Over the next 30 days, patient will verbalize understanding of plan for bilateral lower leg pain (suspected claudication) . Over the next 15 days, patient will see PCP regarding lower extremity pain  Interventions:  . Discussed history of present illness with patient o Bilateral lower leg pain for greater than 6 months o Pain is only present with ambulation and resolves during rest . Previously offered patient appointment with PCP but he declined at that time. . Scheduled patient an appointment with PCP for 05/15/19 and he is agreeable to that . Encouraged patient to reach out to PCP sooner if he develops any new or worsening symptoms . Encouraged patient to reach out to [facility] team as needed  Patient Self Care Activities:  . Performs ADL's independently . Has  help as needed from his son  Please see past updates related to this goal by clicking on the "Past Updates" button in the selected goal          Plan:   The care management team will reach out to the patient again over the next 45 days.  Follow up with provider re: lower extremity pain on 05/15/2019   Chong Sicilian, BSN, RN-BC [place] [facility] [place] Management Direct Dial: [phone]

## 2019-05-12 ENCOUNTER — Other Ambulatory Visit: Payer: Self-pay | Admitting: Nurse Practitioner

## 2019-05-12 DIAGNOSIS — F411 Generalized anxiety disorder: Secondary | ICD-10-CM

## 2019-05-15 ENCOUNTER — Other Ambulatory Visit: Payer: Self-pay

## 2019-05-15 ENCOUNTER — Encounter: Payer: Self-pay | Admitting: Nurse Practitioner

## 2019-05-15 ENCOUNTER — Ambulatory Visit (INDEPENDENT_AMBULATORY_CARE_PROVIDER_SITE_OTHER): Payer: Medicare HMO | Admitting: Nurse Practitioner

## 2019-05-15 VITALS — BP 139/53 | HR 73 | Temp 99.1°F | Resp 20 | Ht 67.0 in | Wt 130.0 lb

## 2019-05-15 DIAGNOSIS — M79604 Pain in right leg: Secondary | ICD-10-CM

## 2019-05-15 DIAGNOSIS — M79605 Pain in left leg: Secondary | ICD-10-CM

## 2019-05-15 NOTE — Progress Notes (Signed)
   Subjective:    Patient ID: Steven [name], male    DOB: 06/12/1928, 86 y.o.   MRN: [id number]   Chief Complaint: bilateral lower leg pain with walking   HPI Pt is here today for bilateral lower leg pain while walking from the knees down to the top of his feet. Says it has been going on for a while and wanted to get it checked out. Happens only when walking and once he sits down the pain goes away after a short time. Is an aching pain. Rates pain 5/10 when it does occur. Pain is not as bad when he walks with a walker.       Review of Systems  Constitutional: Negative.   HENT: Negative.   Eyes: Negative.   Respiratory: Negative.   Cardiovascular: Negative.   Gastrointestinal: Negative.   Genitourinary: Negative.   Musculoskeletal:       Leg pain when walking  Skin: Negative.   Neurological: Negative.   Psychiatric/Behavioral: Positive for confusion.       Objective:   Physical Exam Vitals and nursing note reviewed.  Constitutional:      Appearance: Normal appearance.  HENT:     Head: Normocephalic.  Cardiovascular:     Rate and Rhythm: Normal rate and regular rhythm.     Pulses: Normal pulses.     Heart sounds: Normal heart sounds.  Pulmonary:     Effort: Pulmonary effort is normal.     Breath sounds: Normal breath sounds.  Abdominal:     General: Bowel sounds are normal.  Musculoskeletal:        General: Normal range of motion.     Cervical back: Normal range of motion.  Skin:    General: Skin is warm and dry.  Neurological:     Mental Status: He is alert. Mental status is at baseline.  Psychiatric:        Cognition and Memory: He exhibits impaired recent memory.    BP (!) 139/53   Pulse 73   Temp 99.1 F (37.3 C) (Temporal)   Resp 20   Ht 5\' 7"  (1.702 m)   Wt 130 lb (59 kg)   SpO2 96%   BMI 20.36 kg/m      Assessment & Plan:  Steven [name] comes in today with chief complaint of bilateral lower leg pain with walking   Diagnosis and orders  addressed:  1. Bilateral leg pain Refuses to get peripheral artery studies   Labs pending Health Maintenance reviewed Diet and exercise encouraged  Follow up plan: appointment made in 2 weeks  [place], [name]

## 2019-05-22 ENCOUNTER — Ambulatory Visit: Payer: Medicare HMO | Admitting: *Deleted

## 2019-05-22 ENCOUNTER — Ambulatory Visit (INDEPENDENT_AMBULATORY_CARE_PROVIDER_SITE_OTHER): Payer: Medicare HMO | Admitting: Licensed Clinical Social Worker

## 2019-05-22 DIAGNOSIS — J439 Emphysema, unspecified: Secondary | ICD-10-CM

## 2019-05-22 DIAGNOSIS — I10 Essential (primary) hypertension: Secondary | ICD-10-CM | POA: Diagnosis not present

## 2019-05-22 DIAGNOSIS — M79605 Pain in left leg: Secondary | ICD-10-CM

## 2019-05-22 DIAGNOSIS — I5032 Chronic diastolic (congestive) heart failure: Secondary | ICD-10-CM

## 2019-05-22 DIAGNOSIS — N1832 Chronic kidney disease, stage 3b: Secondary | ICD-10-CM

## 2019-05-22 DIAGNOSIS — E785 Hyperlipidemia, unspecified: Secondary | ICD-10-CM | POA: Diagnosis not present

## 2019-05-22 DIAGNOSIS — F411 Generalized anxiety disorder: Secondary | ICD-10-CM

## 2019-05-22 DIAGNOSIS — M79604 Pain in right leg: Secondary | ICD-10-CM

## 2019-05-22 NOTE — Chronic Care Management (AMB) (Signed)
Chronic Care Management    Clinical Social Work Follow Up Note  05/22/2019 Name: Steven [name] MRN: [id number] DOB: 11-Jul-1928  Steven [name] is a 86 y.o. year old male who is a primary care patient of [name], [place]. The CCM team was consulted for assistance with [employer] .   Review of patient status, including review of consultants reports, other relevant assessments, and collaboration with appropriate care team members and the patient's provider was performed as part of comprehensive patient evaluation and provision of chronic care management services.    SDOH (Social Determinants of Health) assessments performed: Yes; risk of tobacco use; risk of depression    Office Visit from 02/11/2019 in [place]  PHQ-9 Total Score  5     GAD 7 : Generalized Anxiety Score 12/11/2018 05/08/2016  Nervous, Anxious, on Edge 1 1  Control/stop worrying 1 1  Worry too much - different things 1 0  Trouble relaxing 0 1  Restless 1 1  Easily annoyed or irritable 0 0  Afraid - awful might happen 0 0  Total GAD 7 Score 4 4  Anxiety Difficulty Not difficult at all Not difficult at all    Outpatient Encounter Medications as of 05/22/2019  Medication Sig Note  . albuterol (PROVENTIL) (2.5 MG/3ML) 0.083% nebulizer solution Take 3 mLs (2.5 mg total) by nebulization every 2 (two) hours as needed for wheezing or shortness of breath. 04/17/2016: PRN Only  . aspirin 81 MG tablet Take 81 mg by mouth daily.  08/04/2015: Received from: External Pharmacy  . atorvastatin (LIPITOR) 10 MG tablet TAKE (1) TABLET BY MOUTH ONCE DAILY.   . clonazePAM (KLONOPIN) 1 MG tablet Take 1 tablet (1 mg total) by mouth 3 (three) times daily as needed for anxiety.   [name] diltiazem (CARDIZEM CD) 120 MG 24 hr capsule TAKE 1 CAPSULE BY MOUTH ONCE A DAY.   [name] doxazosin (CARDURA) 2 MG tablet TAKE (1) TABLET BY MOUTH ONCE DAILY.   [name] FERREX 150 150 MG capsule TAKE 1 CAPSULE BY MOUTH ONCE A DAY.   .  furosemide (LASIX) 20 MG tablet TAKE 1 TABLET BY MOUTH EVERY OTHER DAY.   [name] ipratropium-albuterol (DUONEB) 0.5-2.5 (3) MG/3ML SOLN Take 3 mLs by nebulization 3 (three) times daily. 12/31/2017: Patient reports infrequent use; does not know when he received his nebulizer; no DME maintenance being performed   No facility-administered encounter medications on file as of 05/22/2019.    Goals    . "I want to make sure I can keep control of my anxiety"  (pt-stated)      Barriers: Pain issues faced Mental Health issues in patient with Chronic Diagnoses of CHF, CKD, COPD,  HTN, Hyperlipidemia and Anxiety State  Clinical Social Work Clinical Goal(s): Over the next 30 days client and family caregiver will verbalize understanding of long term plan for management of anxiety with panic attacks.    Interventions:  Encouraged client to talk with RNCM regarding nursing needs of client  Talked with client about his upcoming appointments  Talked with clientabout relaxation techniques(likes to have breakfast with friends several days weekly, plays guitar, enjoys visiting with family members  Talked with Donnis about his completion of daily ADLs.  Talked with [name] about ambulation challenges (client said he often has pain in his lower extremities when walking)  Talked with [name] about social support network (son is supportive)  Theme park manager with RNCM about nursing needs of client  Talked with Nehan about pain issues he  faces in his legs when walking  Patient Self Care Activities:  . Participates in morning breakfast gathering with friends. [name] with family. Sees son each Saturday.   .  Talks openly about his struggles with anxiety and medications taken for anxiety.   Plan:   Client to communicate with family members as needed to discuss ongoing client needs. Client to communicate with LCSW as needed to discuss psychosocial needs of client LCSW to call client in next 4 weeks to discuss  client management of anxiety and panic symptoms Client to attend scheduled medical appointments Client to communicate with RN CM as needed to discuss nursing needs of client. Client to socialize with family or friends as a way to manage anxiety or stress symptoms    Follow Up Plan:  LCSW to call client in next 4 weeks to talk about client management of anxiety and panic symptoms  [name].Tegh Franek MSW, LCSW Licensed Clinical Social Worker [facility] Care Management [phone]

## 2019-05-22 NOTE — Patient Instructions (Addendum)
Licensed Clinical Social Worker Visit Information  Goals we discussed today:  Goals        . "I want to make sure I can keep control of my anxiety"  (pt-stated)      Barriers: Pain issues faced Mental Health issues in patient with Chronic Diagnoses of CHF, CKD, COPD,  HTN, Hyperlipidemia and Anxiety State  Clinical Social Work Clinical Goal(s): Over the next 30 days client and family caregiver will verbalize understanding of long term plan for management of anxiety with panic attacks.    Interventions: Encouraged client to talk with RNCM regarding nursing needs of client Talked with client about his upcoming appointments  Talked with client about relaxation techniques (likes to have breakfast with friends several days weekly, plays guitar, enjoys visiting with family members  Talked with Steven [name] about his completion of daily ADLs.  Talked with Steven [name] about ambulation challenges  Talked with Steven [name] about social support network (son is supportive)  Theme park manager with RNCM about nursing needs of client Talked with Steven [name] about pain issues he faces in his legs when walking  Patient Self Care Activities:  . Participates in morning breakfast gathering with friends. Steven [name] with family. Sees son each Saturday.   .  Talks openly about his struggles with anxiety and medications taken for anxiety.   Plan:   Client to communicate with family members as needed to discuss ongoing client needs. Client to communicate with LCSW as needed to discuss psychosocial needs of client LCSW to call client in next 4 weeks to discuss client management of anxiety and panic symptoms Client to attend scheduled medical appointments Client to communicate with RN CM as needed to discuss nursing needs of client. Client to socialize with family or friends as a way to manage anxiety or stress symptoms      Materials Provided: No  Follow Up Plan: LCSW to call client in next 4 weeks to discuss client management of   anxiety and panic symptoms   The patient verbalized understanding of instructions provided today and declined a print copy of patient instruction materials.   Steven [name].Vasil Juhasz MSW, LCSW Licensed Clinical Social Worker [facility] [phone]

## 2019-05-25 ENCOUNTER — Other Ambulatory Visit: Payer: Self-pay | Admitting: Nurse Practitioner

## 2019-05-25 ENCOUNTER — Telehealth: Payer: Self-pay | Admitting: Nurse Practitioner

## 2019-05-25 DIAGNOSIS — F411 Generalized anxiety disorder: Secondary | ICD-10-CM

## 2019-05-25 NOTE — Chronic Care Management (AMB) (Signed)
  Chronic Care Management   Care Coordination Note   05/22/2019 Name: Steven [name] MRN: [id number] DOB: 05-03-1928  Referred by: [name], [name] Reason for referral : Chronic Care Management (care coordination)   CLAYDEN WITHEM is a 86 y.o. year old male who is a primary care patient of [name], [place]. The CCM team was consulted for assistance with chronic disease management and care coordination needs.    Review of patient status, including review of consultants reports, relevant laboratory and other test results, and collaboration with appropriate care team members and the patient's provider was performed as part of comprehensive patient evaluation and provision of chronic care management services.     RN Care Plan     Patient Stated   . "I want to find out why my legs hurt when I walk" (pt-stated)       Lower extremity pain in patient with CAD, CHF, and CKD  Current Barriers:  [name] Knowledge Deficits related to cause of bilateral LE leg pain during ambulation . Cognitive Deficits  Nurse Case Manager Clinical Goal(s):  [name] Over the next 30 days, patient will discuss lower leg pain with RN Care Manager  Interventions:  . Chart reviewed . Collaborated with PCP  o Patient declined further testing on lower legs . Collaborated with LCSW who had a telephone call with patient today o Patient still complains of leg pain o Advised LCSW that he was evaluated by PCP and refused further testing . RN will talk with patient again over the next 30 days and will talk with patient's son if patient is agreeable  Patient Self Care Activities:  . Performs ADL's independently . Has help as needed from his son  Please see past updates related to this goal by clicking on the "Past Updates" button in the selected goal          Plan:   The care management team will reach out to the patient again over the next 30 days.   Chong Sicilian, BSN, RN-BC Embedded Chronic Care  Manager [facility] Direct Dial: [phone]

## 2019-05-25 NOTE — Telephone Encounter (Signed)
  Prescription Request  05/25/2019  What is the name of the medication or equipment? Klonopin  Have you contacted your pharmacy to request a refill? (if applicable) Yes  Which pharmacy would you like this sent to? [facility] [place]  Pt came in stating that he is completely out of this Rx and is requesting that MMM send him in [facility] [place] to last him until he can come in for his appt with her on 06/04/19.   Patient notified that their request is being sent to the clinical staff for review and that they should receive a response within 2 business days.

## 2019-05-25 NOTE — Telephone Encounter (Signed)
Appointment moved up

## 2019-05-25 NOTE — Patient Instructions (Signed)
Visit Information  Goals Addressed            This Visit's Progress     Patient Stated   . "I want to find out why my legs hurt when I walk" (pt-stated)       Lower extremity pain in patient with CAD, CHF, and CKD  Current Barriers:  [name] Knowledge Deficits related to cause of bilateral LE leg pain during ambulation . Cognitive Deficits  Nurse Case Manager Clinical Goal(s):  [name] Over the next 30 days, patient will discuss lower leg pain with RN Care Manager  Interventions:  . Chart reviewed . Collaborated with PCP  o Patient declined further testing on lower legs . Collaborated with LCSW who had a telephone call with patient today o Patient still complains of leg pain o Advised LCSW that he was evaluated by PCP and refused further testing . RN will talk with patient again over the next 30 days and will talk with patient's son if patient is agreeable  Patient Self Care Activities:  . Performs ADL's independently . Has help as needed from his son  Please see past updates related to this goal by clicking on the "Past Updates" button in the selected goal         The patient verbalized understanding of instructions provided today and declined a print copy of patient instruction materials.   Follow-up Plan The care management team will reach out to the patient again over the next 30 days.   Chong Sicilian, BSN, RN-BC Embedded Chronic Care Manager [facility] Direct Dial: [phone]

## 2019-05-26 ENCOUNTER — Other Ambulatory Visit: Payer: Self-pay

## 2019-05-26 ENCOUNTER — Ambulatory Visit (INDEPENDENT_AMBULATORY_CARE_PROVIDER_SITE_OTHER): Payer: Medicare HMO | Admitting: Nurse Practitioner

## 2019-05-26 ENCOUNTER — Encounter: Payer: Self-pay | Admitting: Nurse Practitioner

## 2019-05-26 VITALS — BP 144/88 | HR 84 | Temp 98.4°F | Resp 20 | Ht 67.0 in | Wt 130.0 lb

## 2019-05-26 DIAGNOSIS — I5032 Chronic diastolic (congestive) heart failure: Secondary | ICD-10-CM

## 2019-05-26 DIAGNOSIS — N1832 Chronic kidney disease, stage 3b: Secondary | ICD-10-CM

## 2019-05-26 DIAGNOSIS — R0989 Other specified symptoms and signs involving the circulatory and respiratory systems: Secondary | ICD-10-CM | POA: Diagnosis not present

## 2019-05-26 DIAGNOSIS — F411 Generalized anxiety disorder: Secondary | ICD-10-CM | POA: Diagnosis not present

## 2019-05-26 DIAGNOSIS — D649 Anemia, unspecified: Secondary | ICD-10-CM

## 2019-05-26 DIAGNOSIS — F0390 Unspecified dementia without behavioral disturbance: Secondary | ICD-10-CM

## 2019-05-26 DIAGNOSIS — J439 Emphysema, unspecified: Secondary | ICD-10-CM

## 2019-05-26 DIAGNOSIS — C3490 Malignant neoplasm of unspecified part of unspecified bronchus or lung: Secondary | ICD-10-CM | POA: Diagnosis not present

## 2019-05-26 DIAGNOSIS — E785 Hyperlipidemia, unspecified: Secondary | ICD-10-CM

## 2019-05-26 DIAGNOSIS — N400 Enlarged prostate without lower urinary tract symptoms: Secondary | ICD-10-CM | POA: Diagnosis not present

## 2019-05-26 DIAGNOSIS — I10 Essential (primary) hypertension: Secondary | ICD-10-CM

## 2019-05-26 DIAGNOSIS — D508 Other iron deficiency anemias: Secondary | ICD-10-CM

## 2019-05-26 MED ORDER — CLONAZEPAM 1 MG PO TABS: 1.0000 mg | ORAL_TABLET | Freq: Two times a day (BID) | ORAL | 4 refills | Status: DC

## 2019-05-26 MED ORDER — DILTIAZEM HCL ER COATED BEADS 120 MG PO CP24: 120.0000 mg | ORAL_CAPSULE | Freq: Every day | ORAL | 3 refills | Status: DC

## 2019-05-26 MED ORDER — FUROSEMIDE 20 MG PO TABS: 20.0000 mg | ORAL_TABLET | ORAL | 3 refills | Status: DC

## 2019-05-26 MED ORDER — DOXAZOSIN MESYLATE 2 MG PO TABS: 2.0000 mg | ORAL_TABLET | Freq: Every day | ORAL | 3 refills | Status: DC

## 2019-05-26 MED ORDER — POLYSACCHARIDE IRON COMPLEX 150 MG PO CAPS: 150.0000 mg | ORAL_CAPSULE | Freq: Every day | ORAL | 3 refills | Status: DC

## 2019-05-26 MED ORDER — ATORVASTATIN CALCIUM 10 MG PO TABS: 10.0000 mg | ORAL_TABLET | Freq: Every day | ORAL | 3 refills | Status: DC

## 2019-05-26 NOTE — Patient Instructions (Signed)
Stress, Adult Stress is a normal reaction to life events. Stress is what you feel when life demands more than you are used to, or more than you think you can handle. Some stress can be useful, such as studying for a test or meeting a deadline at work. Stress that occurs too often or for too long can cause problems. It can affect your emotional health and interfere with relationships and normal daily activities. Too much stress can weaken your body's defense system (immune system) and increase your risk for physical illness. If you already have a medical problem, stress can make it worse. What are the causes? All sorts of life events can cause stress. An event that causes stress for one person may not be stressful for another person. Major life events, whether positive or negative, commonly cause stress. Examples include:  Losing a job or starting a new job.  Losing a loved one.  Moving to a new town or home.  Getting married or divorced.  Having a baby.  Getting injured or sick. Less obvious life events can also cause stress, especially if they occur day after day or in combination with each other. Examples include:  Working long hours.  Driving in traffic.  Caring for children.  Being in debt.  Being in a difficult relationship. What are the signs or symptoms? Stress can cause emotional symptoms, including:  Anxiety. This is feeling worried, afraid, on edge, overwhelmed, or out of control.  Anger, including irritation or impatience.  Depression. This is feeling sad, down, helpless, or guilty.  Trouble focusing, remembering, or making decisions. Stress can cause physical symptoms, including:  Aches and pains. These may affect your head, neck, back, stomach, or other areas of your body.  Tight muscles or a clenched jaw.  Low energy.  Trouble sleeping. Stress can cause unhealthy behaviors, including:  Eating to feel better (overeating) or skipping meals.  Working too  much or putting off tasks.  Smoking, drinking alcohol, or using drugs to feel better. How is this diagnosed? Stress is diagnosed through an assessment by your health care provider. He or she may diagnose this condition based on:  Your symptoms and any stressful life events.  Your medical history.  Tests to rule out other causes of your symptoms. Depending on your condition, your health care provider may refer you to a specialist for further evaluation. How is this treated?  Stress management techniques are the recommended treatment for stress. Medicine is not typically recommended for the treatment of stress. Techniques to reduce your reaction to stressful life events include:  Stress identification. Monitor yourself for symptoms of stress and identify what causes stress for you. These skills may help you to avoid or prepare for stressful events.  Time management. Set your priorities, keep a calendar of events, and learn to say no. Taking these actions can help you avoid making too many commitments. Techniques for coping with stress include:  Rethinking the problem. Try to think realistically about stressful events rather than ignoring them or overreacting. Try to find the positives in a stressful situation rather than focusing on the negatives.  Exercise. Physical exercise can release both physical and emotional tension. The key is to find a form of exercise that you enjoy and do it regularly.  Relaxation techniques. These relax the body and mind. The key is to find one or more that you enjoy and use the techniques regularly. Examples include: ? Meditation, deep breathing, or progressive relaxation techniques. ? Yoga or   tai chi. ? Biofeedback, mindfulness techniques, or journaling. ? Listening to music, being out in nature, or participating in other hobbies.  Practicing a healthy lifestyle. Eat a balanced diet, drink plenty of water, limit or avoid caffeine, and get plenty of  sleep.  Having a strong support network. Spend time with family, friends, or other people you enjoy being around. Express your feelings and talk things over with someone you trust. Counseling or talk therapy with a mental health professional may be helpful if you are having trouble managing stress on your own. Follow these instructions at home: Lifestyle   Avoid drugs.  Do not use any products that contain nicotine or tobacco, such as cigarettes, e-cigarettes, and chewing tobacco. If you need help quitting, ask your health care provider.  Limit alcohol intake to no more than 1 drink a day for nonpregnant women and 2 drinks a day for men. One drink equals 12 oz of beer, 5 oz of wine, or 1 oz of hard liquor  Do not use alcohol or drugs to relax.  Eat a balanced diet that includes fresh fruits and vegetables, whole grains, lean meats, fish, eggs, and beans, and low-fat dairy. Avoid processed foods and foods high in added fat, sugar, and salt.  Exercise at least 30 minutes on 5 or more days each week.  Get 7-8 hours of sleep each night. General instructions   Practice stress management techniques as discussed with your health care provider.  Drink enough fluid to keep your urine clear or pale yellow.  Take over-the-counter and prescription medicines only as told by your health care provider.  Keep all follow-up visits as told by your health care provider. This is important. Contact a health care provider if:  Your symptoms get worse.  You have new symptoms.  You feel overwhelmed by your problems and can no longer manage them on your own. Get help right away if:  You have thoughts of hurting yourself or others. If you ever feel like you may hurt yourself or others, or have thoughts about taking your own life, get help right away. You can go to your nearest emergency department or call:  Your local emergency services (911 in the [place]  A suicide crisis helpline, such as the  [place] at (316) 250-6172. This is open 24 hours a day. Summary  Stress is a normal reaction to life events. It can cause problems if it happens too often or for too long.  Practicing stress management techniques is the best way to treat stress.  Counseling or talk therapy with a mental health professional may be helpful if you are having trouble managing stress on your own. This information is not intended to replace advice given to you by your health care provider. Make sure you discuss any questions you have with your health care provider. Document Revised: 08/22/2018 Document Reviewed: 03/14/2016 Elsevier Patient Education  [place].

## 2019-05-26 NOTE — Progress Notes (Signed)
Subjective:    Patient ID: Steven [name], male    DOB: Feb 17, 1928, 86 y.o.   MRN: [id number]   Chief Complaint: Medical Management of Chronic Issues    HPI:  1. Essential hypertension No c/o chest pain, sob or headache. Does not check blood pressure at home. He is currently not on any medication because blood pressure was getting low. BP Readings from Last 3 Encounters:  05/26/19 (!) 179/65  05/15/19 (!) 139/53  02/11/19 (!) 138/58     2. Chronic diastolic congestive heart failure ([place]) He is on cardizem daily. He has not seen cardiology lately- says he has trouble getting there and he feels fine.  3. Pulmonary emphysema, unspecified emphysema type ([place]) Still smoking daily. Says he just cant quit.  4. Squamous cell carcinoma of lung, unspecified laterality ([place]) Had lung cancerin 2013. Has been released from oncology  5. Dementia without behavioral disturbance, unspecified dementia type [facility]) He has terrible short term memory loss. stya anxious  6. Benign prostatic hyperplasia without lower urinary tract symptoms No problems voiding  7. Stage 3b chronic kidney disease Lab Results  Component Value Date   CREATININE 1.68 (H) 12/11/2018   BUN 19 12/11/2018   NA 141 12/11/2018   K 4.4 12/11/2018   CL 98 12/11/2018   CO2 29 12/11/2018     8. Abdominal bruit No problems that he is awareof  9. Anxiety state stayss anxious all the time. Says he I so anxious that he can not go out in public much. He was on klonopin in November an d  I cannot figure out who stopped medication and why. He says that it worked well  To keep him calm. Needs to go back on it. GAD 7 : Generalized Anxiety Score 12/11/2018 05/08/2016  Nervous, Anxious, on Edge 1 1  Control/stop worrying 1 1  Worry too much - different things 1 0  Trouble relaxing 0 1  Restless 1 1  Easily annoyed or irritable 0 0  Afraid - awful might happen 0 0  Total GAD 7 Score 4 4  Anxiety Difficulty Not difficult at all  Not difficult at all      10. Chronic anemia Denies fatigue Lab Results  Component Value Date   HGB 12.0 (L) 12/11/2018     11. Hyperlipidemia with target LDL less than 100 Does not wtach diet and does no exercise.    Outpatient Encounter Medications as of 05/26/2019  Medication Sig  . albuterol (PROVENTIL) (2.5 MG/3ML) 0.083% nebulizer solution Take 3 mLs (2.5 mg total) by nebulization every 2 (two) hours as needed for wheezing or shortness of breath.  [name] aspirin 81 MG tablet Take 81 mg by mouth daily.   [name] atorvastatin (LIPITOR) 10 MG tablet TAKE (1) TABLET BY MOUTH ONCE DAILY.  . clonazePAM (KLONOPIN) 1 MG tablet Take 1 tablet (1 mg total) by mouth 3 (three) times daily as needed for anxiety.  [name] diltiazem (CARDIZEM CD) 120 MG 24 hr capsule TAKE 1 CAPSULE BY MOUTH ONCE A DAY.  [name] doxazosin (CARDURA) 2 MG tablet TAKE (1) TABLET BY MOUTH ONCE DAILY.  [name] FERREX 150 150 MG capsule TAKE 1 CAPSULE BY MOUTH ONCE A DAY.  . furosemide (LASIX) 20 MG tablet TAKE 1 TABLET BY MOUTH EVERY OTHER DAY.  [name] ipratropium-albuterol (DUONEB) 0.5-2.5 (3) MG/3ML SOLN Take 3 mLs by nebulization 3 (three) times daily.   No facility-administered encounter medications on file as of 05/26/2019.    Past Surgical History:  Procedure Laterality Date  . CARPAL TUNNEL RELEASE  1980's   right and left  . CHOLECYSTECTOMY    . COLONOSCOPY N/A 09/29/2014   Procedure: COLONOSCOPY;  Surgeon: Najeeb U Rehman, MD;  Location: [facility];  Service: Endoscopy;  Laterality: N/A;  . CORONARY ARTERY BYPASS GRAFT  1999   5 grafts  . FLEXIBLE BRONCHOSCOPY  11/07/2011   Procedure: FLEXIBLE BRONCHOSCOPY;  Surgeon: Bryan K Bartle, MD;  Location: [facility] OR;  Service: Thoracic;  Laterality: N/A;  . HERNIA REPAIR  2000   umbilical hernia repair  . JOINT REPLACEMENT  2000   right shoulder rotator cuff repair    Family History  Problem Relation Age of Onset  . Cancer Mother        pancreatic  . Pulmonary embolism Father   .  Anxiety disorder Other     New complaints: None other then his anxiety mentioned above.  Social history: Lives by hisself  Controlled substance contract: 05/26/19    Review of Systems  Constitutional: Negative for diaphoresis.  Eyes: Negative for pain.  Respiratory: Negative for shortness of breath.   Cardiovascular: Negative for chest pain, palpitations and leg swelling.  Gastrointestinal: Negative for abdominal pain.  Endocrine: Negative for polydipsia.  Skin: Negative for rash.  Neurological: Negative for dizziness, weakness and headaches.  Hematological: Does not bruise/bleed easily.  Psychiatric/Behavioral: The patient is nervous/anxious.   All other systems reviewed and are negative.      Objective:   Physical Exam Vitals and nursing note reviewed.  Constitutional:      Appearance: Normal appearance. He is well-developed.  HENT:     Head: Normocephalic.     Nose: Nose normal.  Eyes:     Pupils: Pupils are equal, round, and reactive to light.  Neck:     Thyroid: No thyroid mass or thyromegaly.     Vascular: No carotid bruit or JVD.     Trachea: Phonation normal.  Cardiovascular:     Rate and Rhythm: Normal rate and regular rhythm.     Heart sounds: Murmur (2/6 systolic) present.  Pulmonary:     Effort: Pulmonary effort is normal. No respiratory distress.     Breath sounds: Normal breath sounds.  Abdominal:     General: Bowel sounds are normal.     Palpations: Abdomen is soft.     Tenderness: There is no abdominal tenderness.  Musculoskeletal:        General: Normal range of motion.     Cervical back: Normal range of motion and neck supple.  Lymphadenopathy:     Cervical: No cervical adenopathy.  Skin:    General: Skin is warm and dry.  Neurological:     Mental Status: He is alert and oriented to person, place, and time.  Psychiatric:        Thought Content: Thought content normal.        Judgment: Judgment normal.     Comments: Very nervous Poor  eye contact Shaky Answers all questions appropriately    BP (!) 144/88 (BP Location: Left Arm, Cuff Size: Normal)   Pulse 84   Temp 98.4 F (36.9 C) (Temporal)   Resp 20   Ht 5' 7" (1.702 m)   Wt 130 lb (59 kg)   SpO2 95%   BMI 20.36 kg/m          Assessment & Plan:  Elzia D Varble comes in today with chief complaint of Medical Management of Chronic Issues   Diagnosis and   orders addressed:  1. Essential hypertension Low sodium diet - furosemide (LASIX) 20 MG tablet; Take 1 tablet (20 mg total) by mouth every other day.  Dispense: 30 tablet; Refill: 3 - CMP14+EGFR - Lipid panel  2. Chronic diastolic congestive heart failure ([place]) Needs to see cardiology butis just not able to do so right now - diltiazem (CARDIZEM CD) 120 MG 24 hr capsule; Take 1 capsule (120 mg total) by mouth daily.  Dispense: 28 capsule; Refill: 3  3. Pulmonary emphysema, unspecified emphysema type ([place]) Smoking cessation encouraged, but he is to anxious to stop now  4. Squamous cell carcinoma of lung, unspecified laterality ([place])  5. Dementia without behavioral disturbance, unspecified dementia type ([place])  6. Benign prostatic hyperplasia without lower urinary tract symptoms - doxazosin (CARDURA) 2 MG tablet; Take 1 tablet (2 mg total) by mouth daily.  Dispense: 28 tablet; Refill: 3  7. Stage 3b chronic kidney disease Labs pending  8. Abdominal bruit  9. Anxiety state aqdded klonopin back BID - clonazePAM (KLONOPIN) 1 MG tablet; Take 1 tablet (1 mg total) by mouth 2 (two) times daily.  Dispense: 60 tablet; Refill: 4  10. Chronic anemia - CBC with Differential/Platelet  11. Hyperlipidemia with target LDL less than 100 Low fat diet - atorvastatin (LIPITOR) 10 MG tablet; Take 1 tablet (10 mg total) by mouth daily.  Dispense: 28 tablet; Refill: 3  12. Iron deficiency anemia secondary to inadequate dietary iron intake Continue iron supplemengt daily - iron polysaccharides (FERREX 150) 150  MG capsule; Take 1 capsule (150 mg total) by mouth daily.  Dispense: 28 capsule; Refill: 3   Labs pending Health Maintenance reviewed Diet and exercise encouraged  Follow up plan: 2 weeks   Mary-Margaret Hassell Done, [name]

## 2019-05-27 LAB — CBC WITH DIFFERENTIAL/PLATELET
Basophils Absolute: 0.1 10*3/uL (ref 0.0–0.2)
Basos: 1 %
EOS (ABSOLUTE): 0.2 10*3/uL (ref 0.0–0.4)
Eos: 4 %
Hematocrit: 34.4 % — ABNORMAL LOW (ref 37.5–51.0)
Hemoglobin: 11.3 g/dL — ABNORMAL LOW (ref 13.0–17.7)
Immature Grans (Abs): 0 10*3/uL (ref 0.0–0.1)
Immature Granulocytes: 0 %
Lymphocytes Absolute: 1.9 10*3/uL (ref 0.7–3.1)
Lymphs: 30 %
MCH: 31.7 pg (ref 26.6–33.0)
MCHC: 32.8 g/dL (ref 31.5–35.7)
MCV: 97 fL (ref 79–97)
Monocytes Absolute: 0.7 10*3/uL (ref 0.1–0.9)
Monocytes: 10 %
Neutrophils Absolute: 3.5 10*3/uL (ref 1.4–7.0)
Neutrophils: 55 %
Platelets: 170 10*3/uL (ref 150–450)
RBC: 3.56 x10E6/uL — ABNORMAL LOW (ref 4.14–5.80)
RDW: 12.2 % (ref 11.6–15.4)
WBC: 6.4 10*3/uL (ref 3.4–10.8)

## 2019-05-27 LAB — CMP14+EGFR
ALT: 8 IU/L (ref 0–44)
AST: 17 IU/L (ref 0–40)
Albumin/Globulin Ratio: 1.6 (ref 1.2–2.2)
Albumin: 4.5 g/dL (ref 3.6–4.6)
Alkaline Phosphatase: 74 IU/L (ref 39–117)
BUN/Creatinine Ratio: 10 (ref 10–24)
BUN: 17 mg/dL (ref 8–27)
Bilirubin Total: 0.2 mg/dL (ref 0.0–1.2)
CO2: 28 mmol/L (ref 20–29)
Calcium: 9.7 mg/dL (ref 8.6–10.2)
Chloride: 100 mmol/L (ref 96–106)
Creatinine, Ser: 1.63 mg/dL — ABNORMAL HIGH (ref 0.76–1.27)
GFR calc Af Amer: 43 mL/min/{1.73_m2} — ABNORMAL LOW (ref >59)
GFR calc non Af Amer: 37 mL/min/{1.73_m2} — ABNORMAL LOW (ref >59)
Globulin, Total: 2.9 g/dL (ref 1.5–4.5)
Glucose: 119 mg/dL — ABNORMAL HIGH (ref 65–99)
Potassium: 4.7 mmol/L (ref 3.5–5.2)
Sodium: 140 mmol/L (ref 134–144)
Total Protein: 7.4 g/dL (ref 6.0–8.5)

## 2019-05-27 LAB — LIPID PANEL
Chol/HDL Ratio: 2.1 ratio (ref 0.0–5.0)
Cholesterol, Total: 130 mg/dL (ref 100–199)
HDL: 62 mg/dL (ref >39)
LDL Chol Calc (NIH): 51 mg/dL (ref 0–99)
Triglycerides: 88 mg/dL (ref 0–149)
VLDL Cholesterol Cal: 17 mg/dL (ref 5–40)

## 2019-06-04 ENCOUNTER — Ambulatory Visit: Payer: Self-pay | Admitting: Nurse Practitioner

## 2019-06-11 ENCOUNTER — Other Ambulatory Visit: Payer: Self-pay

## 2019-06-11 ENCOUNTER — Ambulatory Visit: Payer: Medicare PPO | Admitting: Nurse Practitioner

## 2019-06-11 ENCOUNTER — Encounter: Payer: Self-pay | Admitting: Nurse Practitioner

## 2019-06-11 VITALS — BP 126/70 | HR 84 | Temp 97.8°F | Ht 67.0 in | Wt 128.2 lb

## 2019-06-11 DIAGNOSIS — E785 Hyperlipidemia, unspecified: Secondary | ICD-10-CM

## 2019-06-11 DIAGNOSIS — R0989 Other specified symptoms and signs involving the circulatory and respiratory systems: Secondary | ICD-10-CM | POA: Diagnosis not present

## 2019-06-11 DIAGNOSIS — D649 Anemia, unspecified: Secondary | ICD-10-CM

## 2019-06-11 DIAGNOSIS — F0390 Unspecified dementia without behavioral disturbance: Secondary | ICD-10-CM | POA: Diagnosis not present

## 2019-06-11 DIAGNOSIS — N1832 Chronic kidney disease, stage 3b: Secondary | ICD-10-CM | POA: Diagnosis not present

## 2019-06-11 DIAGNOSIS — I5032 Chronic diastolic (congestive) heart failure: Secondary | ICD-10-CM | POA: Diagnosis not present

## 2019-06-11 DIAGNOSIS — F411 Generalized anxiety disorder: Secondary | ICD-10-CM | POA: Diagnosis not present

## 2019-06-11 DIAGNOSIS — I10 Essential (primary) hypertension: Secondary | ICD-10-CM | POA: Diagnosis not present

## 2019-06-11 DIAGNOSIS — N400 Enlarged prostate without lower urinary tract symptoms: Secondary | ICD-10-CM

## 2019-06-11 DIAGNOSIS — J439 Emphysema, unspecified: Secondary | ICD-10-CM

## 2019-06-11 NOTE — Patient Instructions (Signed)
Stress, Adult Stress is a normal reaction to life events. Stress is what you feel when life demands more than you are used to, or more than you think you can handle. Some stress can be useful, such as studying for a test or meeting a deadline at work. Stress that occurs too often or for too long can cause problems. It can affect your emotional health and interfere with relationships and normal daily activities. Too much stress can weaken your body's defense system (immune system) and increase your risk for physical illness. If you already have a medical problem, stress can make it worse. What are the causes? All sorts of life events can cause stress. An event that causes stress for one person may not be stressful for another person. Major life events, whether positive or negative, commonly cause stress. Examples include:  Losing a job or starting a new job.  Losing a loved one.  Moving to a new town or home.  Getting married or divorced.  Having a baby.  Getting injured or sick. Less obvious life events can also cause stress, especially if they occur day after day or in combination with each other. Examples include:  Working long hours.  Driving in traffic.  Caring for children.  Being in debt.  Being in a difficult relationship. What are the signs or symptoms? Stress can cause emotional symptoms, including:  Anxiety. This is feeling worried, afraid, on edge, overwhelmed, or out of control.  Anger, including irritation or impatience.  Depression. This is feeling sad, down, helpless, or guilty.  Trouble focusing, remembering, or making decisions. Stress can cause physical symptoms, including:  Aches and pains. These may affect your head, neck, back, stomach, or other areas of your body.  Tight muscles or a clenched jaw.  Low energy.  Trouble sleeping. Stress can cause unhealthy behaviors, including:  Eating to feel better (overeating) or skipping meals.  Working too  much or putting off tasks.  Smoking, drinking alcohol, or using drugs to feel better. How is this diagnosed? Stress is diagnosed through an assessment by your health care provider. He or she may diagnose this condition based on:  Your symptoms and any stressful life events.  Your medical history.  Tests to rule out other causes of your symptoms. Depending on your condition, your health care provider may refer you to a specialist for further evaluation. How is this treated?  Stress management techniques are the recommended treatment for stress. Medicine is not typically recommended for the treatment of stress. Techniques to reduce your reaction to stressful life events include:  Stress identification. Monitor yourself for symptoms of stress and identify what causes stress for you. These skills may help you to avoid or prepare for stressful events.  Time management. Set your priorities, keep a calendar of events, and learn to say no. Taking these actions can help you avoid making too many commitments. Techniques for coping with stress include:  Rethinking the problem. Try to think realistically about stressful events rather than ignoring them or overreacting. Try to find the positives in a stressful situation rather than focusing on the negatives.  Exercise. Physical exercise can release both physical and emotional tension. The key is to find a form of exercise that you enjoy and do it regularly.  Relaxation techniques. These relax the body and mind. The key is to find one or more that you enjoy and use the techniques regularly. Examples include: ? Meditation, deep breathing, or progressive relaxation techniques. ? Yoga or   tai chi. ? Biofeedback, mindfulness techniques, or journaling. ? Listening to music, being out in nature, or participating in other hobbies.  Practicing a healthy lifestyle. Eat a balanced diet, drink plenty of water, limit or avoid caffeine, and get plenty of sleep.   Having a strong support network. Spend time with family, friends, or other people you enjoy being around. Express your feelings and talk things over with someone you trust. Counseling or talk therapy with a mental health professional may be helpful if you are having trouble managing stress on your own. Follow these instructions at home: Lifestyle   Avoid drugs.  Do not use any products that contain nicotine or tobacco, such as cigarettes, e-cigarettes, and chewing tobacco. If you need help quitting, ask your health care provider.  Limit alcohol intake to no more than 1 drink a day for nonpregnant women and 2 drinks a day for men. One drink equals 12 oz of beer, 5 oz of wine, or 1 oz of hard liquor  Do not use alcohol or drugs to relax.  Eat a balanced diet that includes fresh fruits and vegetables, whole grains, lean meats, fish, eggs, and beans, and low-fat dairy. Avoid processed foods and foods high in added fat, sugar, and salt.  Exercise at least 30 minutes on 5 or more days each week.  Get 7-8 hours of sleep each night. General instructions   Practice stress management techniques as discussed with your health care provider.  Drink enough fluid to keep your urine clear or pale yellow.  Take over-the-counter and prescription medicines only as told by your health care provider.  Keep all follow-up visits as told by your health care provider. This is important. Contact a health care provider if:  Your symptoms get worse.  You have new symptoms.  You feel overwhelmed by your problems and can no longer manage them on your own. Get help right away if:  You have thoughts of hurting yourself or others. If you ever feel like you may hurt yourself or others, or have thoughts about taking your own life, get help right away. You can go to your nearest emergency department or call:  Your local emergency services (911 in the [place]  A suicide crisis helpline, such as the National  Suicide Prevention Lifeline at 1-800-273-8255. This is open 24 hours a day. Summary  Stress is a normal reaction to life events. It can cause problems if it happens too often or for too long.  Practicing stress management techniques is the best way to treat stress.  Counseling or talk therapy with a mental health professional may be helpful if you are having trouble managing stress on your own. This information is not intended to replace advice given to you by your health care provider. Make sure you discuss any questions you have with your health care provider. Document Revised: 08/22/2018 Document Reviewed: 03/14/2016 Elsevier Patient Education  2020 Elsevier Inc.  

## 2019-06-11 NOTE — Progress Notes (Signed)
Subjective:    Patient ID: Steven [name], male    DOB: 02-12-1928, 86 y.o.   MRN: [id number]   Chief Complaint: Anxiety (Patient states he is doing good since he has been on clonazepam.)    HPI:  1. Essential hypertension Patient states he does not take his blood pressures at home. Denies dizziness, syncope, or headaches. BP Readings from Last 3 Encounters:  06/11/19 126/70  05/26/19 (!) 144/88  05/15/19 (!) 139/53    2. Chronic diastolic congestive heart failure ([place]) Patient denies shortness of breath, swelling, or decrease in activity level.   3. Hyperlipidemia with target LDL less than 100 Patient states that he doesn't watch what he eats but doesn't eat a lot. Denies a decrease in appetite. Lab Results  Component Value Date   CHOL 130 05/26/2019   HDL 62 05/26/2019   LDLCALC 51 05/26/2019   TRIG 88 05/26/2019   CHOLHDL 2.1 05/26/2019     4. Stage 3b chronic kidney disease Lab Results  Component Value Date   CREATININE 1.63 (H) 05/26/2019     5. Pulmonary emphysema, unspecified emphysema type [facility]) Patient states he hasn't had to use his inhaler for a while. Denies coughing.  6. Anxiety state Patient states he is doing well on Klonopin. GAD 7 : Generalized Anxiety Score 06/11/2019 12/11/2018 05/08/2016  Nervous, Anxious, on Edge 0 1 1  Control/stop worrying 1 1 1   Worry too much - different things 1 1 0  Trouble relaxing 0 0 1  Restless 0 1 1  Easily annoyed or irritable 0 0 0  Afraid - awful might happen 1 0 0  Total GAD 7 Score 3 4 4   Anxiety Difficulty Not difficult at all Not difficult at all Not difficult at all     7. Abdominal bruit   8. Chronic anemia Patient state he has no more fatigue than usual.  Lab Results  Component Value Date   WBC 6.4 05/26/2019   HGB 11.3 (L) 05/26/2019   HCT 34.4 (L) 05/26/2019   MCV 97 05/26/2019   PLT 170 05/26/2019     9. Dementia without behavioral disturbance, unspecified dementia type [facility]) Patient  alert and oriented. Memory intact.  10. Benign prostatic hyperplasia without lower urinary tract symptoms Patient denies urinary symptoms.  New complaints: Patient denies new complaints today.  Social history: Patient denies new social complaints today.  Controlled substance contract:      Review of Systems  All other systems reviewed and are negative.      Objective:   Physical Exam Constitutional:      Appearance: Normal appearance.  HENT:     Head: Normocephalic.  Eyes:     Conjunctiva/sclera: Conjunctivae normal.  Cardiovascular:     Rate and Rhythm: Normal rate and regular rhythm.     Heart sounds: Murmur present. Systolic murmur present with a grade of 2/6.     Comments: Patient complains of intermittent claudication symptoms. Pulmonary:     Effort: Pulmonary effort is normal.     Breath sounds: Decreased breath sounds present.  Abdominal:     General: Bowel sounds are normal.     Palpations: Abdomen is soft.  Musculoskeletal:     Cervical back: Normal range of motion and neck supple.  Skin:    General: Skin is warm and dry.  Neurological:     Mental Status: He is alert and oriented to person, place, and time.  Psychiatric:        Mood  and Affect: Mood normal.        Behavior: Behavior normal.    Blood pressure 126/70, pulse 84, temperature 97.8 F (36.6 C), temperature source Temporal, height 5\' 7"  (1.702 m), weight 128 lb 3.2 oz (58.2 kg), SpO2 97 %.      Assessment & Plan:  Steven [name] comes in today with chief complaint of Anxiety (Patient states he is doing good since he has been on clonazepam.)   Diagnosis and orders addressed:  1. Essential hypertension Low odium diet  2. Chronic diastolic congestive heart failure ([place]) Keep follow up with cardiology  3. Hyperlipidemia with target LDL less than 100 Low fat diet  4. Stage 3b chronic kidney disease No lab drawn today  5. Pulmonary emphysema, unspecified emphysema type  ([place]) Smoking cessation  6. Anxiety state stress management Will keep on klonopin for now  7. Abdominal bruit  8. Chronic anemia  9. Dementia without behavioral disturbance, unspecified dementia type ([place]) Encouraged to interact with others daily  10. Benign prostatic hyperplasia without lower urinary tract symptoms   Labs pending Health Maintenance reviewed Diet and exercise encouraged  Follow up plan: 3 month   [place], [name]

## 2019-06-12 ENCOUNTER — Ambulatory Visit: Payer: Self-pay | Admitting: Nurse Practitioner

## 2019-06-16 ENCOUNTER — Ambulatory Visit: Payer: Medicare PPO | Admitting: *Deleted

## 2019-06-16 NOTE — Chronic Care Management (AMB) (Signed)
  Chronic Care Management   Follow-up Note 06/16/2019 Name: Steven [name] MRN: [id number] DOB: 09-21-1928  Referred by: [name], [name] Reason for referral : Chronic Care Management (RN follow up)   An unsuccessful telephone follow-up was attempted today. The patient was referred to the case management team for assistance with care management and care coordination.   Follow Up Plan: The care management team will reach out to the patient again over the next 30 days.   Chong Sicilian, BSN, RN-BC Embedded Chronic Care Manager [facility] [place] Management Direct Dial: [phone]

## 2019-06-22 ENCOUNTER — Ambulatory Visit (INDEPENDENT_AMBULATORY_CARE_PROVIDER_SITE_OTHER): Payer: Medicare PPO | Admitting: Licensed Clinical Social Worker

## 2019-06-22 DIAGNOSIS — F411 Generalized anxiety disorder: Secondary | ICD-10-CM

## 2019-06-22 DIAGNOSIS — E785 Hyperlipidemia, unspecified: Secondary | ICD-10-CM | POA: Diagnosis not present

## 2019-06-22 DIAGNOSIS — J439 Emphysema, unspecified: Secondary | ICD-10-CM | POA: Diagnosis not present

## 2019-06-22 DIAGNOSIS — N1832 Chronic kidney disease, stage 3b: Secondary | ICD-10-CM

## 2019-06-22 DIAGNOSIS — I10 Essential (primary) hypertension: Secondary | ICD-10-CM

## 2019-06-22 DIAGNOSIS — I5032 Chronic diastolic (congestive) heart failure: Secondary | ICD-10-CM

## 2019-06-22 NOTE — Chronic Care Management (AMB) (Signed)
Chronic Care Management    Clinical Social Work Follow Up Note  06/22/2019 Name: Steven [name] MRN: [id number] DOB: March 16, 1928  Steven [name] is a 86 y.o. year old male who is a primary care patient of [name], [place]. The CCM team was consulted for assistance with [employer] .   Review of patient status, including review of consultants reports, other relevant assessments, and collaboration with appropriate care team members and the patient's provider was performed as part of comprehensive patient evaluation and provision of chronic care management services.    SDOH (Social Determinants of Health) assessments performed: Yes; risk of tobacco use; risk of depression    Office Visit from 02/11/2019 in [place]  PHQ-9 Total Score  5     GAD 7 : Generalized Anxiety Score 06/11/2019 12/11/2018 05/08/2016  Nervous, Anxious, on Edge 0 1 1  Control/stop worrying 1 1 1   Worry too much - different things 1 1 0  Trouble relaxing 0 0 1  Restless 0 1 1  Easily annoyed or irritable 0 0 0  Afraid - awful might happen 1 0 0  Total GAD 7 Score 3 4 4   Anxiety Difficulty Not difficult at all Not difficult at all Not difficult at all    Outpatient Encounter Medications as of 06/22/2019  Medication Sig Note  . albuterol (PROVENTIL) (2.5 MG/3ML) 0.083% nebulizer solution Take 3 mLs (2.5 mg total) by nebulization every 2 (two) hours as needed for wheezing or shortness of breath. 04/17/2016: PRN Only  . aspirin 81 MG tablet Take 81 mg by mouth daily.  08/04/2015: Received from: External Pharmacy  . atorvastatin (LIPITOR) 10 MG tablet Take 1 tablet (10 mg total) by mouth daily.   . clonazePAM (KLONOPIN) 1 MG tablet Take 1 tablet (1 mg total) by mouth 2 (two) times daily.   [name] diltiazem (CARDIZEM CD) 120 MG 24 hr capsule Take 1 capsule (120 mg total) by mouth daily.   [name] doxazosin (CARDURA) 2 MG tablet Take 1 tablet (2 mg total) by mouth daily.   . furosemide (LASIX) 20 MG  tablet Take 1 tablet (20 mg total) by mouth every other day.   . ipratropium-albuterol (DUONEB) 0.5-2.5 (3) MG/3ML SOLN Take 3 mLs by nebulization 3 (three) times daily. 12/31/2017: Patient reports infrequent use; does not know when he received his nebulizer; no DME maintenance being performed  . iron polysaccharides (FERREX 150) 150 MG capsule Take 1 capsule (150 mg total) by mouth daily.    No facility-administered encounter medications on file as of 06/22/2019.    Goals    . "I want to make sure I can keep control of my anxiety"  (pt-stated)      Barriers: Pain issues faced Mental Health issues in patient with Chronic Diagnoses of CHF, CKD, COPD,  HTN, Hyperlipidemia and Anxiety State  Clinical Social Work Clinical Goal(s): Over the next 30 days client and family caregiver will verbalize understanding of long term plan for management of anxiety with panic attacks.    Interventions:  Encouraged client to talk with RNCM regarding nursing needs of client  Talked with client about his upcoming appointments  Talked with clientabout relaxation techniques(likes to have breakfast with friends several days weekly, plays guitar, enjoys visiting with family members  Talked with Steven [name] about his completion of daily ADLs.  Talked with Steven [name] about ambulation challenges(client said he often has pain in his lower extremities when walking)  Talked with Steven [name] about social support network (son  is supportive)  Talked with Steven [name] about pain issues he faces in his legs when walking  Talked with Steven [name] about socialization (client said he goes to [employer] regularly for breakfast and to visit with friends)  Patient Self Care Activities:  . Participates in morning breakfast gathering with friends. [name] with family. Sees son each Saturday.   .  Talks openly about his struggles with anxiety and medications taken for anxiety.   Plan:   Client to communicate with family members as needed to  discuss ongoing client needs. Client to communicate with LCSW as needed to discuss psychosocial needs of client LCSW to call client in next 4 weeks to discuss client management of anxiety and panic symptoms Client to attend scheduled medical appointments Client to communicate with RN CM as needed to discuss nursing needs of client. Client to socialize with family or friends as a way to manage anxiety or stress symptoms     Follow Up Plan:LCSW to call client in next 4 weeks to talk about client management of anxiety and panic symptoms  Steven [name].Azizi Bally MSW, LCSW Licensed Clinical Social Worker [facility] Care Management [phone]

## 2019-06-22 NOTE — Patient Instructions (Addendum)
Licensed Clinical Social Worker Visit Information  Goals we discussed today:  Goals    . "I want to make sure I can keep control of my anxiety"  (pt-stated)      Barriers: Pain issues faced Mental Health issues in patient with Chronic Diagnoses of CHF, CKD, COPD,  HTN, Hyperlipidemia and Anxiety State  Clinical Social Work Clinical Goal(s): Over the next 30 days client and family caregiver will verbalize understanding of long term plan for management of anxiety with panic attacks.    Interventions: Encouraged client to talk with RNCM regarding nursing needs of client Talked with client about his upcoming appointments  Talked with client about relaxation techniques (likes to have breakfast with friends several days weekly, plays guitar, enjoys visiting with family members  Talked with Dwyne about his completion of daily ADLs.  Talked with [name] about ambulation challenges (client said he often has pain in his lower extremities when walking)  Talked with [name] about social support network (son is supportive) Talked with Savvas about pain issues he faces in his legs when walking  Talked with [name] about socialization (client said he goes to [employer] regularly for breakfast and to visit with friends)  Patient Self Care Activities:  . Participates in morning breakfast gathering with friends. [name] with family. Sees son each Saturday.   .  Talks openly about his struggles with anxiety and medications taken for anxiety.   Plan:   Client to communicate with family members as needed to discuss ongoing client needs. Client to communicate with LCSW as needed to discuss psychosocial needs of client LCSW to call client in next 4 weeks to discuss client management of anxiety and panic symptoms Client to attend scheduled medical appointments Client to communicate with RN CM as needed to discuss nursing needs of client. Client to socialize with family or friends as a way to manage anxiety  or stress symptoms    Materials Provided: No  Follow Up Plan: LCSW to call client in next 4 weeks to talk about client management of anxiety or panic symptoms  The patient verbalized understanding of instructions provided today and declined a print copy of patient instruction materials.   [name].Hassel Uphoff MSW, LCSW Licensed Clinical Social Worker [facility] [phone]

## 2019-07-03 ENCOUNTER — Ambulatory Visit: Payer: Medicare PPO | Admitting: *Deleted

## 2019-07-03 DIAGNOSIS — I5032 Chronic diastolic (congestive) heart failure: Secondary | ICD-10-CM | POA: Diagnosis not present

## 2019-07-03 DIAGNOSIS — E785 Hyperlipidemia, unspecified: Secondary | ICD-10-CM

## 2019-07-03 DIAGNOSIS — I10 Essential (primary) hypertension: Secondary | ICD-10-CM

## 2019-07-03 DIAGNOSIS — J439 Emphysema, unspecified: Secondary | ICD-10-CM

## 2019-07-03 DIAGNOSIS — N1832 Chronic kidney disease, stage 3b: Secondary | ICD-10-CM

## 2019-07-03 DIAGNOSIS — M79604 Pain in right leg: Secondary | ICD-10-CM

## 2019-07-03 DIAGNOSIS — M79605 Pain in left leg: Secondary | ICD-10-CM

## 2019-07-09 NOTE — Patient Instructions (Signed)
Visit Information  Goals Addressed            This Visit's Progress     Patient Stated   . "I want to find out why my legs hurt when I walk" (pt-stated)       Lower extremity pain in patient with CAD, CHF, and CKD  Current Barriers:  [name] Knowledge Deficits related to cause of bilateral LE leg pain during ambulation . Cognitive Deficits  Nurse Case Manager Clinical Goal(s):  [name] Over the next 30 days, patient will discuss lower leg pain with RN Care Manager  Interventions:  . Chart reviewed . Previously collaborated with PCP  o Patient declined further testing on lower legs . Talked with patient by telephone and discussed continued leg pain  o Reports that it is tolerable and he doesn't want to have any testing at this time . Encouraged patient to reach out to [facility] or PCP with any new or worsening symptoms  Patient Self Care Activities:  . Performs ADL's independently . Has help as needed from his son  Please see past updates related to this goal by clicking on the "Past Updates" button in the selected goal      . "I want to make sure that my weight is ok" (pt-stated)       Nurse Case Manager Clinical Goal(s):   Over the next 60 days patient will eat 3 planned meals a day.  Over the next 60 days patient will weight himself once a week   Interventions:   Discussed current eating habits  Typically has coffee and a biscuit from a fast food restaurant each morning  Prepares light meals. Son brings groceries regularly.  No problems with access to food  Encouraged patient to weigh and record weight once a week and to report any weight loss of >2 lbs to PCP at [phone]  Patient Self Care Activities:  1. Patient able to prepare light meals independently            The patient verbalized understanding of instructions provided today and declined a print copy of patient instruction materials.   Follow-up Plan The care management team will reach out to the  patient again over the next 45 days.   Chong Sicilian, BSN, RN-BC Embedded Chronic Care Manager [facility] / [place] Management Direct Dial: [phone]

## 2019-07-09 NOTE — Chronic Care Management (AMB) (Signed)
Chronic Care Management   Follow Up Note   07/03/2019 Name: Steven [name] MRN: [id number] DOB: 04/29/1928  Referred by: [name], [name] Reason for referral : Chronic Care Management (RN follow up)   Steven [name] is a 86 y.o. year old male who is a primary care patient of [name], [place]. The CCM team was consulted for assistance with chronic disease management and care coordination needs.    Review of patient status, including review of consultants reports, relevant laboratory and other test results, and collaboration with appropriate care team members and the patient's provider was performed as part of comprehensive patient evaluation and provision of chronic care management services.    SDOH (Social Determinants of Health) assessments performed: No See Care Plan activities for detailed interventions related to [facility])     Outpatient Encounter Medications as of 07/03/2019  Medication Sig Note  . albuterol (PROVENTIL) (2.5 MG/3ML) 0.083% nebulizer solution Take 3 mLs (2.5 mg total) by nebulization every 2 (two) hours as needed for wheezing or shortness of breath. 04/17/2016: PRN Only  . aspirin 81 MG tablet Take 81 mg by mouth daily.  08/04/2015: Received from: External Pharmacy  . atorvastatin (LIPITOR) 10 MG tablet Take 1 tablet (10 mg total) by mouth daily.   . clonazePAM (KLONOPIN) 1 MG tablet Take 1 tablet (1 mg total) by mouth 2 (two) times daily.   [name] diltiazem (CARDIZEM CD) 120 MG 24 hr capsule Take 1 capsule (120 mg total) by mouth daily.   [name] doxazosin (CARDURA) 2 MG tablet Take 1 tablet (2 mg total) by mouth daily.   . furosemide (LASIX) 20 MG tablet Take 1 tablet (20 mg total) by mouth every other day.   . ipratropium-albuterol (DUONEB) 0.5-2.5 (3) MG/3ML SOLN Take 3 mLs by nebulization 3 (three) times daily. 12/31/2017: Patient reports infrequent use; does not know when he received his nebulizer; no DME maintenance being performed  . iron polysaccharides  (FERREX 150) 150 MG capsule Take 1 capsule (150 mg total) by mouth daily.    No facility-administered encounter medications on file as of 07/03/2019.     RN Follow-up   . "I want to find out why my legs hurt when I walk" (pt-stated)       Lower extremity pain in patient with CAD, CHF, and CKD  Current Barriers:  [name] Knowledge Deficits related to cause of bilateral LE leg pain during ambulation . Cognitive Deficits  Nurse Case Manager Clinical Goal(s):  [name] Over the next 30 days, patient will discuss lower leg pain with RN Care Manager  Interventions:  . Chart reviewed . Previously collaborated with PCP  o Patient declined further testing on lower legs . Talked with patient by telephone and discussed continued leg pain  o Reports that it is tolerable and he doesn't want to have any testing at this time . Encouraged patient to reach out to [facility] or PCP with any new or worsening symptoms  Patient Self Care Activities:  . Performs ADL's independently . Has help as needed from his son  Please see past updates related to this goal by clicking on the "Past Updates" button in the selected goal      . "I want to make sure that my weight is ok" (pt-stated)       Nurse Case Manager Clinical Goal(s):   Over the next 60 days patient will eat 3 planned meals a day.  Over the next 60 days patient will weight himself once a week  Interventions:   Discussed current eating habits  Typically has coffee and a biscuit from a fast food restaurant each morning  Prepares light meals. Son brings groceries regularly.  No problems with access to food  Encouraged patient to weigh and record weight once a week and to report any weight loss of >2 lbs to PCP at [phone]  Patient Self Care Activities:  1. Patient able to prepare light meals independently             Plan:   The care management team will reach out to the patient again over the next 45 days.   Chong Sicilian, BSN,  RN-BC Embedded Chronic Care Manager [facility] / [place] Management Direct Dial: [phone]

## 2019-07-27 ENCOUNTER — Telehealth: Payer: Self-pay

## 2019-08-19 ENCOUNTER — Ambulatory Visit (INDEPENDENT_AMBULATORY_CARE_PROVIDER_SITE_OTHER): Payer: Medicare PPO | Admitting: *Deleted

## 2019-08-19 DIAGNOSIS — I10 Essential (primary) hypertension: Secondary | ICD-10-CM | POA: Diagnosis not present

## 2019-08-19 DIAGNOSIS — F0390 Unspecified dementia without behavioral disturbance: Secondary | ICD-10-CM

## 2019-08-19 DIAGNOSIS — E785 Hyperlipidemia, unspecified: Secondary | ICD-10-CM | POA: Diagnosis not present

## 2019-08-19 DIAGNOSIS — I5032 Chronic diastolic (congestive) heart failure: Secondary | ICD-10-CM

## 2019-08-19 DIAGNOSIS — J439 Emphysema, unspecified: Secondary | ICD-10-CM | POA: Diagnosis not present

## 2019-09-01 ENCOUNTER — Ambulatory Visit: Payer: Medicare PPO | Admitting: Licensed Clinical Social Worker

## 2019-09-01 DIAGNOSIS — I5032 Chronic diastolic (congestive) heart failure: Secondary | ICD-10-CM

## 2019-09-01 DIAGNOSIS — E785 Hyperlipidemia, unspecified: Secondary | ICD-10-CM

## 2019-09-01 DIAGNOSIS — J439 Emphysema, unspecified: Secondary | ICD-10-CM

## 2019-09-01 DIAGNOSIS — I10 Essential (primary) hypertension: Secondary | ICD-10-CM | POA: Diagnosis not present

## 2019-09-01 DIAGNOSIS — F411 Generalized anxiety disorder: Secondary | ICD-10-CM

## 2019-09-01 DIAGNOSIS — F0390 Unspecified dementia without behavioral disturbance: Secondary | ICD-10-CM

## 2019-09-01 DIAGNOSIS — N1832 Chronic kidney disease, stage 3b: Secondary | ICD-10-CM

## 2019-09-01 NOTE — Patient Instructions (Addendum)
Licensed Clinical Social Worker Visit Information  Goals we discussed today:     .  "I want to make sure I can keep control of my anxiety"  (pt-stated)        Barriers: Pain issues faced Mental Health issues in patient with Chronic Diagnoses of CHF, CKD, COPD,  HTN, Hyperlipidemia and Anxiety State  Clinical Social Work Clinical Goal(s): Over the next 30 days client and family caregiver will verbalize understanding of long term plan for management of anxiety with panic attacks.    Interventions:  Encouraged client to talk with RNCM regarding nursing needs of client  Talked with client about his upcoming appointments  Talked with clientabout relaxation techniques(likes to have breakfast with friends several days weekly, plays guitar, enjoys visiting with family members  Talked with Danial about his completion of daily ADLs.  Talked with [name] about ambulation challenges(client said he often has pain in his lower extremities when walking)  Talked with [name] about social support network(son is supportive)  Talked with [name] about pain issues he faces in his legs when walking  Talked with [name] about socialization (client said he goes to [employer] regularly for breakfast and to visit with friends)  Talked with client about sleeping issues of client  Talked with client about vision of client  Talked with client about his management of anxiety issues  Patient Self Care Activities:   Participates in morning breakfast gathering with friends.  Socializes with family. Sees son each Saturday.     Talks openly about his struggles with anxiety and medications taken for anxiety.   Plan:   Client to communicate with family members as needed to discuss ongoing client needs. Client to communicate with LCSW as needed to discuss psychosocial needs of client LCSW to call client in next 4 weeks to discuss client management of anxiety and panic symptoms Client to attend  scheduled medical appointments Client to communicate with RN CM as needed to discuss nursing needs of client. Client to socialize with family or friends as a way to manage anxiety or stress symptoms     Follow Up Plan: LCSW to call client in next 4 weeks to talk about client management of anxiety or panic symptoms  Materials Provided: No  The patient verbalized understanding of instructions provided today and declined a print copy of patient instruction materials.   [name].Daphne Karrer MSW, LCSW Licensed Clinical Social Worker [facility] Care [facility] [phone]

## 2019-09-01 NOTE — Chronic Care Management (AMB) (Signed)
Chronic Care Management    Clinical Social Work Follow Up Note  09/01/2019 Name: Steven [name] MRN: [id number] DOB: 16-Apr-1928  Steven [name] is a 86 y.o. year old male who is a primary care patient of [name], [place]. The CCM team was consulted for assistance with [employer] .   Review of patient status, including review of consultants reports, other relevant assessments, and collaboration with appropriate care team members and the patient's provider was performed as part of comprehensive patient evaluation and provision of chronic care management services.    SDOH (Social Determinants of Health) assessments performed: No;risk for tobacco use; risk for depression; risk for physical inactivity    Office Visit from 02/11/2019 in [place]  PHQ-9 Total Score 5     GAD 7 : Generalized Anxiety Score 06/11/2019 12/11/2018 05/08/2016  Nervous, Anxious, on Edge 0 1 1  Control/stop worrying 1 1 1   Worry too much - different things 1 1 0  Trouble relaxing 0 0 1  Restless 0 1 1  Easily annoyed or irritable 0 0 0  Afraid - awful might happen 1 0 0  Total GAD 7 Score 3 4 4   Anxiety Difficulty Not difficult at all Not difficult at all Not difficult at all    Outpatient Encounter Medications as of 09/01/2019  Medication Sig Note  . albuterol (PROVENTIL) (2.5 MG/3ML) 0.083% nebulizer solution Take 3 mLs (2.5 mg total) by nebulization every 2 (two) hours as needed for wheezing or shortness of breath. 04/17/2016: PRN Only  . aspirin 81 MG tablet Take 81 mg by mouth daily.  08/04/2015: Received from: External Pharmacy  . atorvastatin (LIPITOR) 10 MG tablet Take 1 tablet (10 mg total) by mouth daily.   . clonazePAM (KLONOPIN) 1 MG tablet Take 1 tablet (1 mg total) by mouth 2 (two) times daily.   [name] diltiazem (CARDIZEM CD) 120 MG 24 hr capsule Take 1 capsule (120 mg total) by mouth daily.   [name] doxazosin (CARDURA) 2 MG tablet Take 1 tablet (2 mg total) by mouth daily.     . furosemide (LASIX) 20 MG tablet Take 1 tablet (20 mg total) by mouth every other day.   . ipratropium-albuterol (DUONEB) 0.5-2.5 (3) MG/3ML SOLN Take 3 mLs by nebulization 3 (three) times daily. 12/31/2017: Patient reports infrequent use; does not know when he received his nebulizer; no DME maintenance being performed  . iron polysaccharides (FERREX 150) 150 MG capsule Take 1 capsule (150 mg total) by mouth daily.    No facility-administered encounter medications on file as of 09/01/2019.    Goals    .  "I want to make sure I can keep control of my anxiety"  (pt-stated)       Barriers: Pain issues faced Mental Health issues in patient with Chronic Diagnoses of CHF, CKD, COPD,  HTN, Hyperlipidemia and Anxiety State  Clinical Social Work Clinical Goal(s): Over the next 30 days client and family caregiver will verbalize understanding of long term plan for management of anxiety with panic attacks.    Interventions:  Encouraged client to talk with RNCM regarding nursing needs of client  Talked with client about his upcoming appointments  Talked with clientabout relaxation techniques(likes to have breakfast with friends several days weekly, plays guitar, enjoys visiting with family members  Talked with Darcey about his completion of daily ADLs.  Talked with [name] about ambulation challenges(client said he often has pain in his lower extremities when walking)  Talked with [name]  about social support network(son is supportive)  Talked with Nizar about pain issues he faces in his legs when walking  Talked with [name] about socialization (client said he goes to [employer] regularly for breakfast and to visit with friends)  Talked with client about sleeping issues of client  Talked with client about vision of client  Talked with client about his management of anxiety issues  Patient Self Care Activities:  . Participates in morning breakfast gathering with friends. [name]  with family. Sees son each Saturday.   .  Talks openly about his struggles with anxiety and medications taken for anxiety.   Plan:   Client to communicate with family members as needed to discuss ongoing client needs. Client to communicate with LCSW as needed to discuss psychosocial needs of client LCSW to call client in next 4 weeks to discuss client management of anxiety and panic symptoms Client to attend scheduled medical appointments Client to communicate with RN CM as needed to discuss nursing needs of client. Client to socialize with family or friends as a way to manage anxiety or stress symptoms     Follow Up Plan: LCSW to call client in next 4 weeks to talk about client management of anxiety or panic symptoms  [name].Eydie Wormley MSW, LCSW Licensed Clinical Social Worker [facility] Care [facility] [phone]

## 2019-09-11 ENCOUNTER — Ambulatory Visit: Payer: Self-pay | Admitting: Nurse Practitioner

## 2019-09-14 NOTE — Chronic Care Management (AMB) (Signed)
Chronic Care Management   Follow Up Note   08/19/2019 Name: Steven [name] MRN: [id number] DOB: 19-Nov-1928  Referred by: Steven Pretty, [name] Reason for referral : Chronic Care Management (RN follow-up)   Steven [name] is a 86 y.o. year old male who is a primary care patient of Steven [name], [place]. The CCM team was consulted for assistance with chronic disease management and care coordination needs.    Review of patient status, including review of consultants reports, relevant laboratory and other test results, and collaboration with appropriate care team members and the patient's provider was performed as part of comprehensive patient evaluation and provision of chronic care management services.    SDOH (Social Determinants of Health) assessments performed: No See Care Plan activities for detailed interventions related to [facility])     Outpatient Encounter Medications as of 08/19/2019  Medication Sig Note  . albuterol (PROVENTIL) (2.5 MG/3ML) 0.083% nebulizer solution Take 3 mLs (2.5 mg total) by nebulization every 2 (two) hours as needed for wheezing or shortness of breath. 04/17/2016: PRN Only  . aspirin 81 MG tablet Take 81 mg by mouth daily.  08/04/2015: Received from: External Pharmacy  . atorvastatin (LIPITOR) 10 MG tablet Take 1 tablet (10 mg total) by mouth daily.   . clonazePAM (KLONOPIN) 1 MG tablet Take 1 tablet (1 mg total) by mouth 2 (two) times daily.   [name] diltiazem (CARDIZEM CD) 120 MG 24 hr capsule Take 1 capsule (120 mg total) by mouth daily.   [name] doxazosin (CARDURA) 2 MG tablet Take 1 tablet (2 mg total) by mouth daily.   . furosemide (LASIX) 20 MG tablet Take 1 tablet (20 mg total) by mouth every other day.   . ipratropium-albuterol (DUONEB) 0.5-2.5 (3) MG/3ML SOLN Take 3 mLs by nebulization 3 (three) times daily. 12/31/2017: Patient reports infrequent use; does not know when he received his nebulizer; no DME maintenance being performed  . iron polysaccharides  (FERREX 150) 150 MG capsule Take 1 capsule (150 mg total) by mouth daily.    No facility-administered encounter medications on file as of 08/19/2019.    BP Readings from Last 3 Encounters:  06/11/19 126/70  05/26/19 (!) 144/88  05/15/19 (!) 139/53     RN Care Plan     Patient Stated   .  "I want to make sure that my weight is ok" (pt-stated)   On track     Nurse Case Manager Clinical Goal(s):   Over the next 60 days patient will eat 3 planned meals a day.  Over the next 60 days patient will weight himself once a week   Interventions:   Discussed current eating habits  Typically has coffee and a biscuit from a fast food restaurant each morning  Prepares light meals. Son brings groceries regularly.  No problems with access to food  Encouraged patient to weigh and record weight once a week and to report any weight loss of >2 lbs to PCP at [phone]  Patient Self Care Activities:  1. Patient able to prepare light meals independently           Other   .  Chronic Disease Management Needs        CARE PLAN ENTRY (see longtitudinal plan of care for additional care plan information)  Current Barriers:  . Chronic Disease Management support, education, and care coordination needs related to Anxiety state, HTN, COPD, CHF, Dementia, CKD,HLD  Clinical Goal(s) related to Anxiety state, HTN, COPD, CHF, Dementia, CKD,HLD:  Over  the next 60 days, patient will:  . Work with the care management team to address educational, disease management, and care coordination needs  . Begin or continue self health monitoring activities as directed today Measure and record blood pressure 3 times per week . Call provider office for new or worsened signs and symptoms Blood pressure findings outside established parameters . Call care management team with questions or concerns . Verbalize basic understanding of patient centered plan of care established today  Interventions related to Anxiety  state, HTN, COPD, CHF, Dementia, CKD,HLD:  . Evaluation of current treatment plans and patient's adherence to plan as established by provider . Assessed patient understanding of disease states . Assessed patient's education and care coordination needs . Provided disease specific education to patient  . Collaborated with appropriate clinical care team members regarding patient needs . Chart reviewed  . Medications reviewed . Provided with RNCM contact number and encouraged to reach out as needed . Reviewed upcoming appts  Patient Self Care Activities related to Anxiety state, HTN, COPD, CHF, Dementia, CKD,HLD:  . Patient is unable to independently self-manage chronic health conditions  Initial goal documentation         Plan:   The care management team will reach out to the patient again over the next 60 days.    Chong Sicilian, BSN, RN-BC Embedded Chronic Care Manager [facility] Care Management Direct Dial: [phone]   g

## 2019-09-14 NOTE — Patient Instructions (Signed)
Visit Information  Goals Addressed              This Visit's Progress     Patient Stated   .  COMPLETED: "I want to find out why my legs hurt when I walk" (pt-stated)        Lower extremity pain in patient with CAD, CHF, and CKD  Current Barriers:  [name] Knowledge Deficits related to cause of bilateral LE leg pain during ambulation . Cognitive Deficits  Nurse Case Manager Clinical Goal(s):  [name] Over the next 30 days, patient will discuss lower leg pain with RN Care Manager  Interventions:  . Chart reviewed . Previously collaborated with PCP  o Patient declined further testing on lower legs . Talked with patient by telephone and discussed continued leg pain  o Reports that it is tolerable and he doesn't want to have any testing at this time . Encouraged patient to reach out to [facility] or PCP with any new or worsening symptoms  Patient Self Care Activities:  . Performs ADL's independently . Has help as needed from his son  Please see past updates related to this goal by clicking on the "Past Updates" button in the selected goal      .  "I want to make sure that my weight is ok" (pt-stated)   On track     Nurse Case Manager Clinical Goal(s):   Over the next 60 days patient will eat 3 planned meals a day.  Over the next 60 days patient will weight himself once a week   Interventions:   Discussed current eating habits  Typically has coffee and a biscuit from a fast food restaurant each morning  Prepares light meals. Son brings groceries regularly.  No problems with access to food  Encouraged patient to weigh and record weight once a week and to report any weight loss of >2 lbs to PCP at [phone]  Patient Self Care Activities:  1. Patient able to prepare light meals independently           Other   .  Chronic Disease Management Needs        CARE PLAN ENTRY (see longtitudinal plan of care for additional care plan information)  Current Barriers:  . Chronic  Disease Management support, education, and care coordination needs related to Anxiety state, HTN, COPD, CHF, Dementia, CKD,HLD  Clinical Goal(s) related to Anxiety state, HTN, COPD, CHF, Dementia, CKD,HLD:  Over the next 60 days, patient will:  . Work with the care management team to address educational, disease management, and care coordination needs  . Begin or continue self health monitoring activities as directed today Measure and record blood pressure 3 times per week . Call provider office for new or worsened signs and symptoms Blood pressure findings outside established parameters . Call care management team with questions or concerns . Verbalize basic understanding of patient centered plan of care established today  Interventions related to Anxiety state, HTN, COPD, CHF, Dementia, CKD,HLD:  . Evaluation of current treatment plans and patient's adherence to plan as established by provider . Assessed patient understanding of disease states . Assessed patient's education and care coordination needs . Provided disease specific education to patient  . Collaborated with appropriate clinical care team members regarding patient needs . Chart reviewed  . Medications reviewed . Provided with RNCM contact number and encouraged to reach out as needed . Reviewed upcoming appts  Patient Self Care Activities related to Anxiety state, HTN, COPD, CHF, Dementia,  CKD,HLD:  . Patient is unable to independently self-manage chronic health conditions  Initial goal documentation        Patient verbalizes understanding of instructions provided today.   Follow-up Plan The care management team will reach out to the patient again over the next 60 days.   Chong Sicilian, BSN, RN-BC Embedded Chronic Care Manager [facility] / [place] [facility] Direct Dial: [phone]

## 2019-09-15 ENCOUNTER — Ambulatory Visit: Payer: Medicare PPO | Admitting: Nurse Practitioner

## 2019-09-15 ENCOUNTER — Encounter: Payer: Self-pay | Admitting: Nurse Practitioner

## 2019-09-15 ENCOUNTER — Other Ambulatory Visit: Payer: Self-pay

## 2019-09-15 VITALS — BP 131/57 | HR 77 | Temp 98.2°F | Resp 20 | Ht 67.0 in | Wt 129.0 lb

## 2019-09-15 DIAGNOSIS — I5032 Chronic diastolic (congestive) heart failure: Secondary | ICD-10-CM

## 2019-09-15 DIAGNOSIS — D508 Other iron deficiency anemias: Secondary | ICD-10-CM

## 2019-09-15 DIAGNOSIS — C3490 Malignant neoplasm of unspecified part of unspecified bronchus or lung: Secondary | ICD-10-CM

## 2019-09-15 DIAGNOSIS — I10 Essential (primary) hypertension: Secondary | ICD-10-CM | POA: Diagnosis not present

## 2019-09-15 DIAGNOSIS — N400 Enlarged prostate without lower urinary tract symptoms: Secondary | ICD-10-CM

## 2019-09-15 DIAGNOSIS — E785 Hyperlipidemia, unspecified: Secondary | ICD-10-CM | POA: Diagnosis not present

## 2019-09-15 DIAGNOSIS — N1832 Chronic kidney disease, stage 3b: Secondary | ICD-10-CM | POA: Diagnosis not present

## 2019-09-15 DIAGNOSIS — J439 Emphysema, unspecified: Secondary | ICD-10-CM

## 2019-09-15 DIAGNOSIS — F0390 Unspecified dementia without behavioral disturbance: Secondary | ICD-10-CM

## 2019-09-15 DIAGNOSIS — F411 Generalized anxiety disorder: Secondary | ICD-10-CM

## 2019-09-15 MED ORDER — FUROSEMIDE 20 MG PO TABS: 20.0000 mg | ORAL_TABLET | ORAL | 3 refills | Status: DC

## 2019-09-15 MED ORDER — CLONAZEPAM 1 MG PO TABS: 1.0000 mg | ORAL_TABLET | Freq: Two times a day (BID) | ORAL | 4 refills | Status: DC

## 2019-09-15 MED ORDER — DILTIAZEM HCL ER COATED BEADS 120 MG PO CP24: 120.0000 mg | ORAL_CAPSULE | Freq: Every day | ORAL | 3 refills | Status: DC

## 2019-09-15 MED ORDER — ATORVASTATIN CALCIUM 10 MG PO TABS: 10.0000 mg | ORAL_TABLET | Freq: Every day | ORAL | 3 refills | Status: DC

## 2019-09-15 MED ORDER — DOXAZOSIN MESYLATE 2 MG PO TABS: 2.0000 mg | ORAL_TABLET | Freq: Every day | ORAL | 3 refills | Status: DC

## 2019-09-15 MED ORDER — POLYSACCHARIDE IRON COMPLEX 150 MG PO CAPS: 150.0000 mg | ORAL_CAPSULE | Freq: Every day | ORAL | 3 refills | Status: DC

## 2019-09-15 NOTE — Progress Notes (Signed)
Subjective:    Patient ID: Steven [name], male    DOB: 1929-01-28, 86 y.o.   MRN: [id number]   Chief Complaint: Medical Management of Chronic Issues    HPI:  1. Essential hypertension No c/o chest pain, sob or headache. Does not check blood pressure at home. BP Readings from Last 3 Encounters:  06/11/19 126/70  05/26/19 (!) 144/88  05/15/19 (!) 139/53     2. Hyperlipidemia with target LDL less than 100 **does ot watch diet and does no exercise* Lab Results  Component Value Date   CHOL 130 05/26/2019   HDL 62 05/26/2019   LDLCALC 51 05/26/2019   TRIG 88 05/26/2019   CHOLHDL 2.1 05/26/2019    3. Chronic diastolic congestive heart failure ([place]) denies any SOB on exertion. No peripheral edema.  4. Pulmonary emphysema, unspecified emphysema type ([place]) He smoked for many years. Is on duoneb as needed. Says he has been doing well lately without any sob or cough. He has not used duoneb in months  5. Squamous cell carcinoma of lung, unspecified laterality ([place]) was released form oncology several years ago. Says he is dong well.  6. Benign prostatic hyperplasia without lower urinary tract symptoms Denies any problems voiding  7. Stage 3b chronic kidney disease Lab Results  Component Value Date   CREATININE 1.63 (H) 05/26/2019   BUN 17 05/26/2019   NA 140 05/26/2019   K 4.7 05/26/2019   CL 100 05/26/2019   CO2 28 05/26/2019     8. Anxiety state Has always been a nervous person. He has been on klonopin for many years. Helps keep him calm  GAD 7 : Generalized Anxiety Score 09/15/2019 06/11/2019 12/11/2018 05/08/2016  Nervous, Anxious, on Edge 1 0 1 1  Control/stop worrying 1 1 1 1   Worry too much - different things 0 1 1 0  Trouble relaxing 0 0 0 1  Restless 1 0 1 1  Easily annoyed or irritable 0 0 0 0  Afraid - awful might happen 1 1 0 0  Total GAD 7 Score 4 3 4 4   Anxiety Difficulty Not difficult at all Not difficult at all Not difficult at all Not difficult at all       9. Dementia without behavioral disturbance, unspecified dementia type ([place]) Memory is worsening. He says he very seldom see his children. No one checks on him he says.    Outpatient Encounter Medications as of 09/15/2019  Medication Sig  . albuterol (PROVENTIL) (2.5 MG/3ML) 0.083% nebulizer solution Take 3 mLs (2.5 mg total) by nebulization every 2 (two) hours as needed for wheezing or shortness of breath.  [name] aspirin 81 MG tablet Take 81 mg by mouth daily.   [name] atorvastatin (LIPITOR) 10 MG tablet Take 1 tablet (10 mg total) by mouth daily.  . clonazePAM (KLONOPIN) 1 MG tablet Take 1 tablet (1 mg total) by mouth 2 (two) times daily.  [name] diltiazem (CARDIZEM CD) 120 MG 24 hr capsule Take 1 capsule (120 mg total) by mouth daily.  [name] doxazosin (CARDURA) 2 MG tablet Take 1 tablet (2 mg total) by mouth daily.  . furosemide (LASIX) 20 MG tablet Take 1 tablet (20 mg total) by mouth every other day.  . ipratropium-albuterol (DUONEB) 0.5-2.5 (3) MG/3ML SOLN Take 3 mLs by nebulization 3 (three) times daily.  . iron polysaccharides (FERREX 150) 150 MG capsule Take 1 capsule (150 mg total) by mouth daily.     Past Surgical History:  Procedure Laterality Date  .  CARPAL TUNNEL RELEASE  1980's   right and left  . CHOLECYSTECTOMY    . COLONOSCOPY N/A 09/29/2014   Procedure: COLONOSCOPY;  Surgeon: Rogene Houston, MD;  [facility];  Service: Endoscopy;  Laterality: N/A;  . CORONARY ARTERY BYPASS GRAFT  1999   5 grafts  . FLEXIBLE BRONCHOSCOPY  11/07/2011   Procedure: FLEXIBLE BRONCHOSCOPY;  Surgeon: Gaye Pollack, MD;  Location: [facility];  Service: Thoracic;  Laterality: N/A;  . HERNIA REPAIR  7290   umbilical hernia repair  . JOINT REPLACEMENT  2000   right shoulder rotator cuff repair    Family History  Problem Relation Age of Onset  . Cancer Mother        pancreatic  . Pulmonary embolism Father   . Anxiety disorder Other     New complaints: None today  Social  history: Lives by hisself. Family calls but they do not come to see him much . Controlled substance contract: 05/27/19    Review of Systems  Constitutional: Negative for diaphoresis.  Eyes: Negative for pain.  Respiratory: Negative for shortness of breath.   Cardiovascular: Negative for chest pain, palpitations and leg swelling.  Gastrointestinal: Negative for abdominal pain.  Endocrine: Negative for polydipsia.  Skin: Negative for rash.  Neurological: Negative for dizziness, weakness and headaches.  Hematological: Does not bruise/bleed easily.  All other systems reviewed and are negative.      Objective:   Physical Exam Vitals and nursing note reviewed.  Constitutional:      Appearance: Normal appearance. He is well-developed.  HENT:     Head: Normocephalic.     Nose: Nose normal.  Eyes:     Pupils: Pupils are equal, round, and reactive to light.  Neck:     Thyroid: No thyroid mass or thyromegaly.     Vascular: No carotid bruit or JVD.     Trachea: Phonation normal.  Cardiovascular:     Rate and Rhythm: Normal rate and regular rhythm.  Pulmonary:     Effort: Pulmonary effort is normal. No respiratory distress.     Breath sounds: Normal breath sounds.  Abdominal:     General: Bowel sounds are normal.     Palpations: Abdomen is soft.     Tenderness: There is no abdominal tenderness.  Musculoskeletal:        General: Normal range of motion.     Cervical back: Normal range of motion and neck supple.  Lymphadenopathy:     Cervical: No cervical adenopathy.  Skin:    General: Skin is warm and dry.  Neurological:     Mental Status: He is alert and oriented to person, place, and time.  Psychiatric:        Behavior: Behavior normal.        Thought Content: Thought content normal.        Judgment: Judgment normal.    BP (!) 131/57   Pulse 77   Temp 98.2 F (36.8 C) (Temporal)   Resp 20   Ht 5' 7"  (1.702 m)   Wt 129 lb (58.5 kg)   SpO2 93%   BMI 20.20 kg/m          Assessment & Plan:  Steven [name] comes in today with chief complaint of Medical Management of Chronic Issues   Diagnosis and orders addressed:  1. Essential hypertension Low sodium diet - furosemide (LASIX) 20 MG tablet; Take 1 tablet (20 mg total) by mouth every other day.  Dispense: 30  tablet; Refill: 3 - CBC with Differential/Platelet - CMP14+EGFR  2. Hyperlipidemia with target LDL less than 100 Low fat diet - atorvastatin (LIPITOR) 10 MG tablet; Take 1 tablet (10 mg total) by mouth daily.  Dispense: 28 tablet; Refill: 3 - Lipid panel  3. Chronic diastolic congestive heart failure (HCC) Report any sob or swelling - diltiazem (CARDIZEM CD) 120 MG 24 hr capsule; Take 1 capsule (120 mg total) by mouth daily.  Dispense: 28 capsule; Refill: 3  4. Pulmonary emphysema, unspecified emphysema type ([place])  5. Squamous cell carcinoma of lung, unspecified laterality ([place])  6. Benign prostatic hyperplasia without lower urinary tract symptoms Report any problems voiding - doxazosin (CARDURA) 2 MG tablet; Take 1 tablet (2 mg total) by mouth daily.  Dispense: 28 tablet; Refill: 3  7. Stage 3b chronic kidney disease Labs oending  8. Anxiety state Stress management - clonazePAM (KLONOPIN) 1 MG tablet; Take 1 tablet (1 mg total) by mouth 2 (two) times daily.  Dispense: 60 tablet; Refill: 4  9. Dementia without behavioral disturbance, unspecified dementia type ([place])  10. Iron deficiency anemia secondary to inadequate dietary iron intake - iron polysaccharides (FERREX 150) 150 MG capsule; Take 1 capsule (150 mg total) by mouth daily.  Dispense: 28 capsule; Refill: 3   Labs pending Health Maintenance reviewed Diet and exercise encouraged  Follow up plan: 3 months   Mary-Margaret Hassell Done, [name]

## 2019-09-15 NOTE — Patient Instructions (Signed)

## 2019-09-16 LAB — CBC WITH DIFFERENTIAL/PLATELET
Basophils Absolute: 0.1 10*3/uL (ref 0.0–0.2)
Basos: 1 %
EOS (ABSOLUTE): 0.3 10*3/uL (ref 0.0–0.4)
Eos: 6 %
Hematocrit: 32.4 % — ABNORMAL LOW (ref 37.5–51.0)
Hemoglobin: 10.6 g/dL — ABNORMAL LOW (ref 13.0–17.7)
Immature Grans (Abs): 0 10*3/uL (ref 0.0–0.1)
Immature Granulocytes: 0 %
Lymphocytes Absolute: 1.6 10*3/uL (ref 0.7–3.1)
Lymphs: 32 %
MCH: 30.8 pg (ref 26.6–33.0)
MCHC: 32.7 g/dL (ref 31.5–35.7)
MCV: 94 fL (ref 79–97)
Monocytes Absolute: 0.4 10*3/uL (ref 0.1–0.9)
Monocytes: 8 %
Neutrophils Absolute: 2.7 10*3/uL (ref 1.4–7.0)
Neutrophils: 53 %
Platelets: 153 10*3/uL (ref 150–450)
RBC: 3.44 x10E6/uL — ABNORMAL LOW (ref 4.14–5.80)
RDW: 12.1 % (ref 11.6–15.4)
WBC: 5 10*3/uL (ref 3.4–10.8)

## 2019-09-16 LAB — CMP14+EGFR
ALT: 8 IU/L (ref 0–44)
AST: 15 IU/L (ref 0–40)
Albumin/Globulin Ratio: 1.4 (ref 1.2–2.2)
Albumin: 4 g/dL (ref 3.6–4.6)
Alkaline Phosphatase: 74 IU/L (ref 48–121)
BUN/Creatinine Ratio: 13 (ref 10–24)
BUN: 19 mg/dL (ref 8–27)
Bilirubin Total: 0.2 mg/dL (ref 0.0–1.2)
CO2: 29 mmol/L (ref 20–29)
Calcium: 9.1 mg/dL (ref 8.6–10.2)
Chloride: 101 mmol/L (ref 96–106)
Creatinine, Ser: 1.44 mg/dL — ABNORMAL HIGH (ref 0.76–1.27)
GFR calc Af Amer: 49 mL/min/{1.73_m2} — ABNORMAL LOW (ref >59)
GFR calc non Af Amer: 43 mL/min/{1.73_m2} — ABNORMAL LOW (ref >59)
Globulin, Total: 2.9 g/dL (ref 1.5–4.5)
Glucose: 101 mg/dL — ABNORMAL HIGH (ref 65–99)
Potassium: 5 mmol/L (ref 3.5–5.2)
Sodium: 141 mmol/L (ref 134–144)
Total Protein: 6.9 g/dL (ref 6.0–8.5)

## 2019-09-16 LAB — LIPID PANEL
Chol/HDL Ratio: 2.2 ratio (ref 0.0–5.0)
Cholesterol, Total: 110 mg/dL (ref 100–199)
HDL: 49 mg/dL (ref >39)
LDL Chol Calc (NIH): 39 mg/dL (ref 0–99)
Triglycerides: 126 mg/dL (ref 0–149)
VLDL Cholesterol Cal: 22 mg/dL (ref 5–40)

## 2019-10-04 IMAGING — DX DG CHEST 2V
2 series · 2 of 2 positions shown · non-contrast
Comparison: Chest x-ray of March 29, 2016

CLINICAL DATA: History of squamous cell carcinoma of the lung.
Previous MI, COPD, former smoker.

EXAM:
CHEST - 2 VIEW

[chest pa]
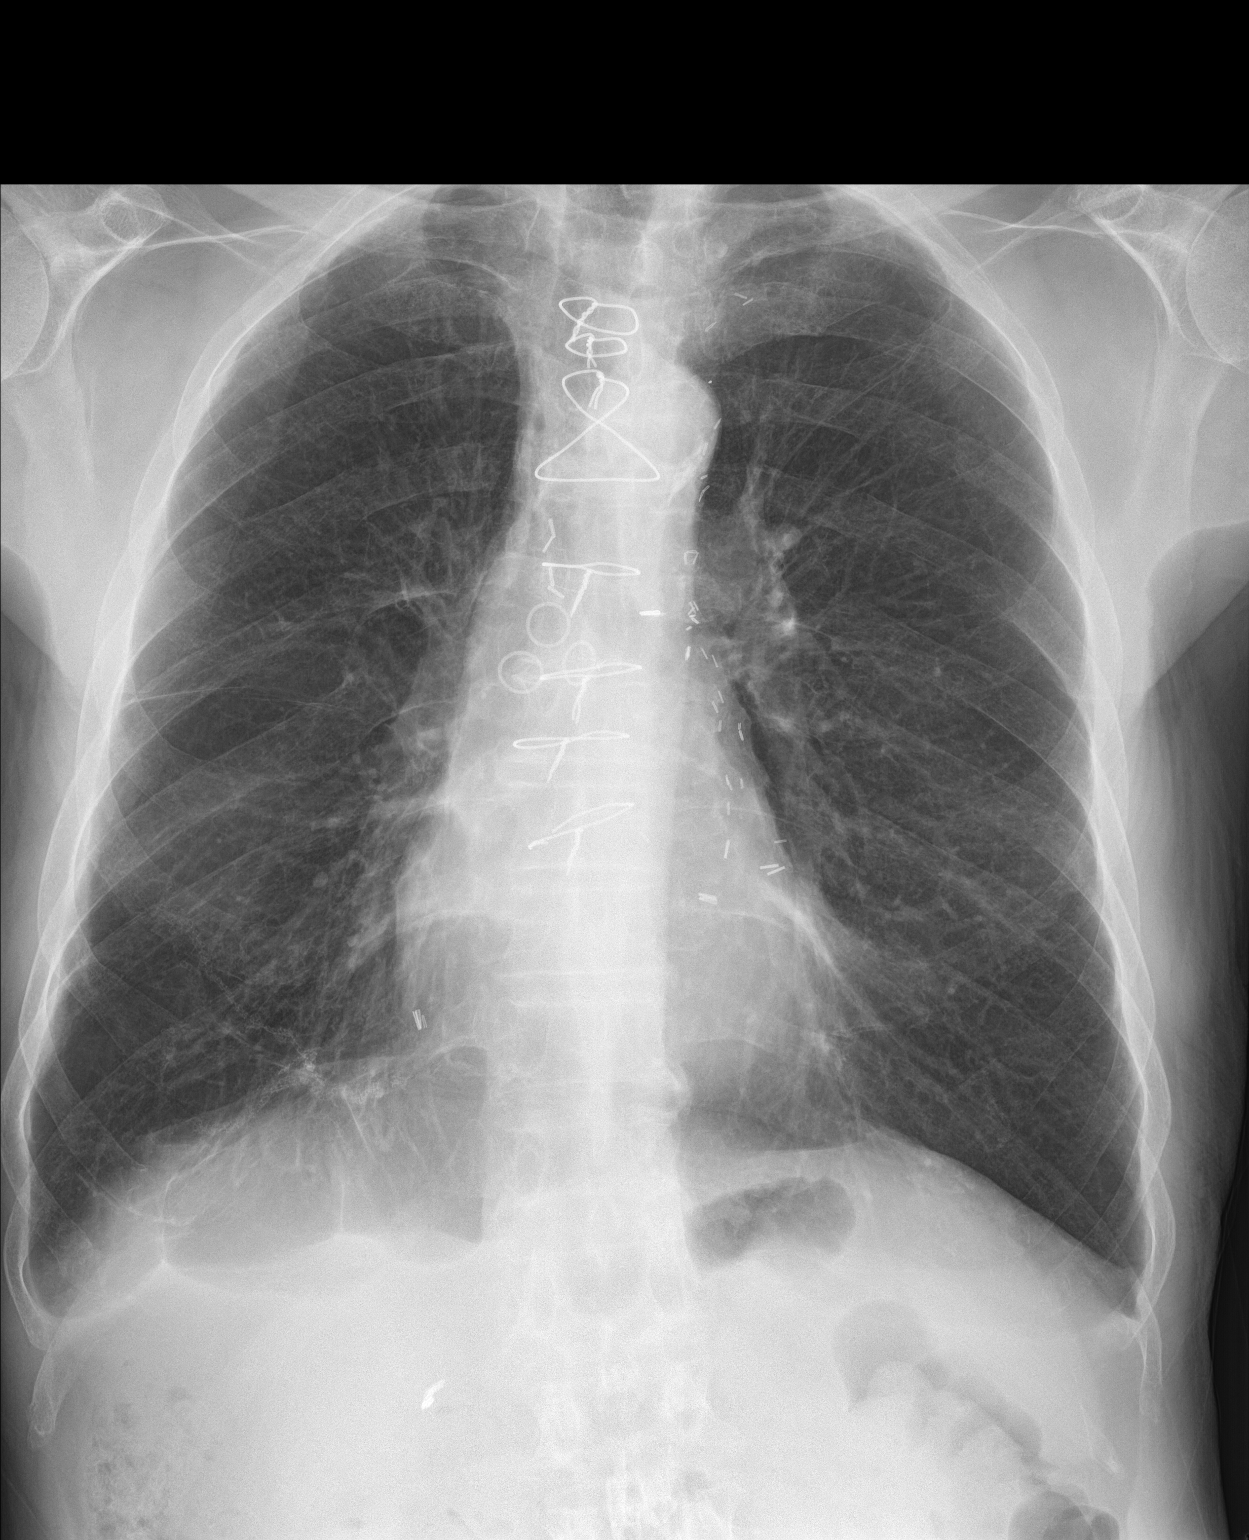

[chest lat]
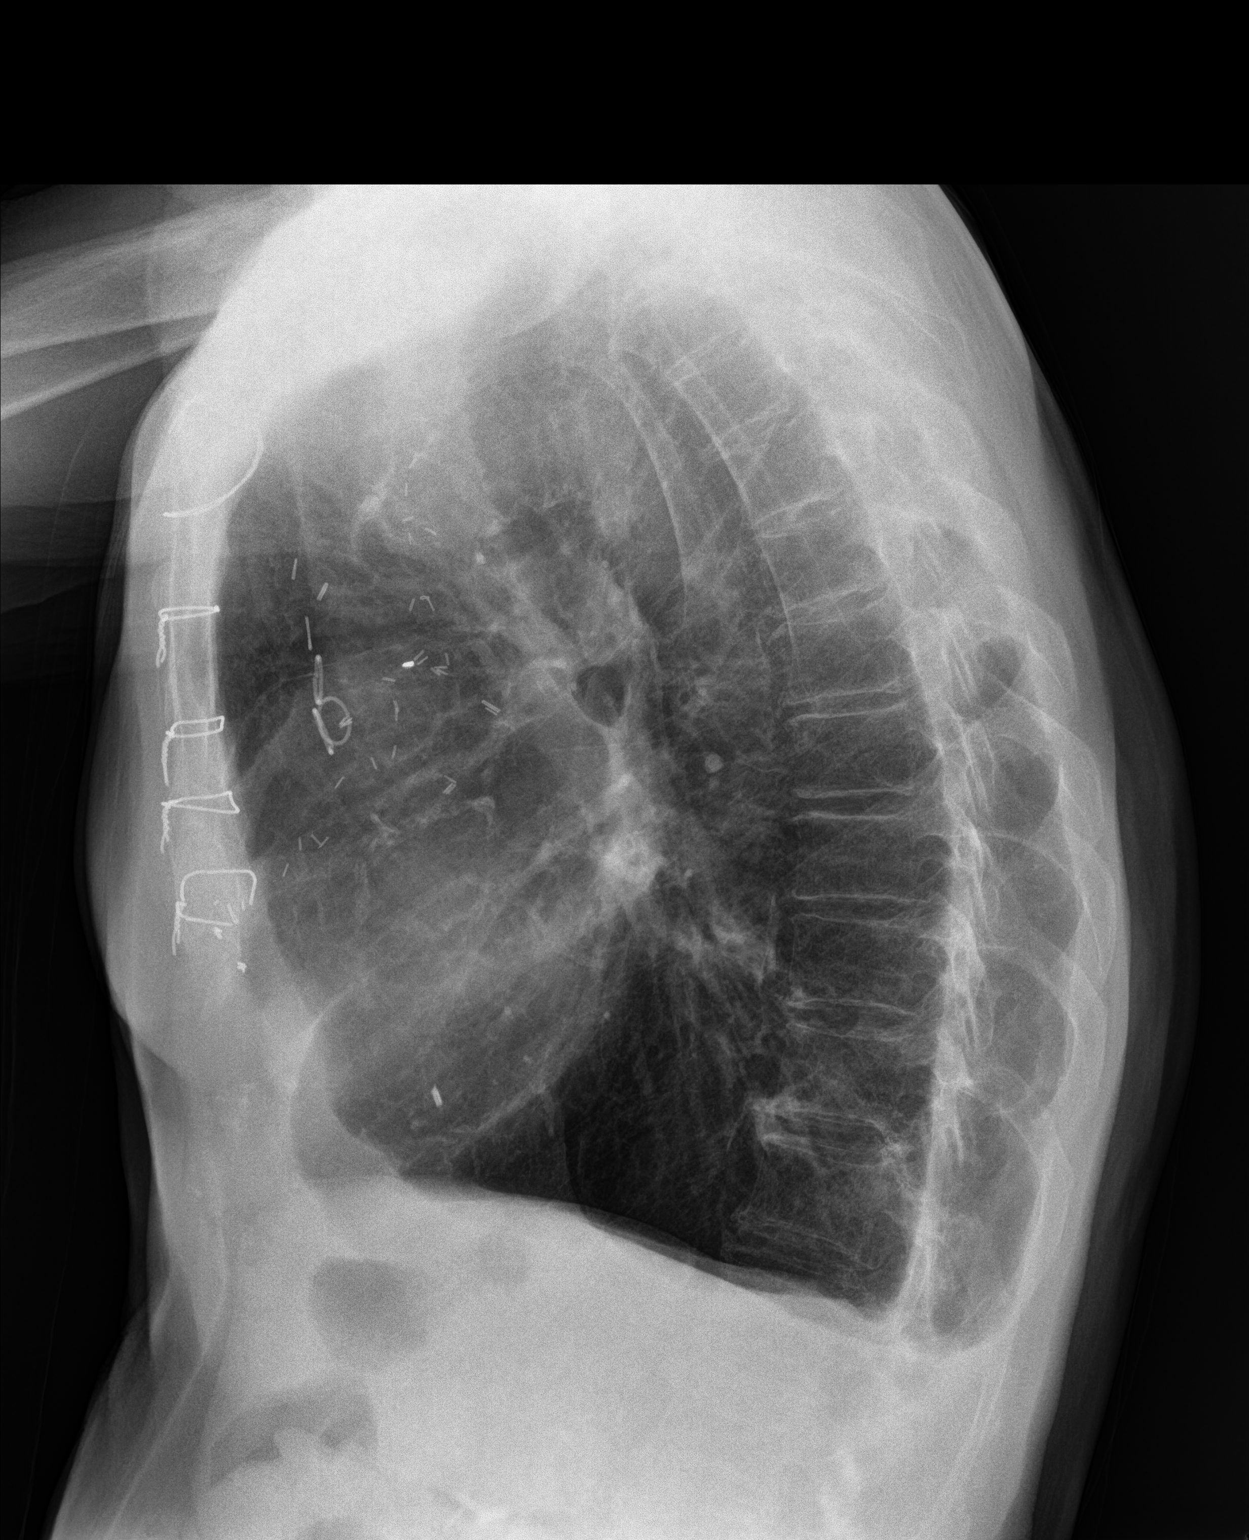

[2 of 2 positions shown; findings below may reference images not displayed]

FINDINGS: The lungs remain hyperinflated with hemidiaphragm flattening. There
is no focal infiltrate. There is no significant pleural effusion.
The heart and pulmonary vascularity are normal. The patient has
undergone previous CABG. There is calcification in the wall of the
thoracic aorta. The bony thorax exhibits no acute abnormality.
IMPRESSION: COPD. Previous CABG. No pneumonia, CHF, nor other acute
cardiopulmonary abnormality. No evidence of active malignancy.

Thoracic aortic atherosclerosis.

## 2019-10-05 ENCOUNTER — Telehealth: Payer: Medicare PPO

## 2019-11-09 ENCOUNTER — Ambulatory Visit (INDEPENDENT_AMBULATORY_CARE_PROVIDER_SITE_OTHER): Payer: Medicare HMO | Admitting: Licensed Clinical Social Worker

## 2019-11-09 DIAGNOSIS — F411 Generalized anxiety disorder: Secondary | ICD-10-CM

## 2019-11-09 DIAGNOSIS — J439 Emphysema, unspecified: Secondary | ICD-10-CM

## 2019-11-09 DIAGNOSIS — I10 Essential (primary) hypertension: Secondary | ICD-10-CM | POA: Diagnosis not present

## 2019-11-09 DIAGNOSIS — I5032 Chronic diastolic (congestive) heart failure: Secondary | ICD-10-CM | POA: Diagnosis not present

## 2019-11-09 DIAGNOSIS — N1832 Chronic kidney disease, stage 3b: Secondary | ICD-10-CM

## 2019-11-09 DIAGNOSIS — E785 Hyperlipidemia, unspecified: Secondary | ICD-10-CM | POA: Diagnosis not present

## 2019-11-09 NOTE — Chronic Care Management (AMB) (Signed)
Chronic Care Management    Clinical Social Work Follow Up Note  11/09/2019 Name: Steven [name] MRN: [id number] DOB: [birth year]  Steven [name] is a 86 y.o. year old male who is a primary care patient of [name], [place]. The CCM team was consulted for assistance with [employer] .   Review of patient status, including review of consultants reports, other relevant assessments, and collaboration with appropriate care team members and the patient's provider was performed as part of comprehensive patient evaluation and provision of chronic care management services.    SDOH (Social Determinants of Health) assessments performed: No; risk for depression; risk for tobacco use; risk for stress; risk for physical inactivity    Office Visit from 02/11/2019 in [facility]  PHQ-9 Total Score 5       GAD 7 : Generalized Anxiety Score 09/15/2019 06/11/2019 12/11/2018 05/08/2016  Nervous, Anxious, on Edge 1 0 1 1  Control/stop worrying 1 1 1 1   Worry too much - different things 0 1 1 0  Trouble relaxing 0 0 0 1  Restless 1 0 1 1  Easily annoyed or irritable 0 0 0 0  Afraid - awful might happen 1 1 0 0  Total GAD 7 Score 4 3 4 4   Anxiety Difficulty Not difficult at all Not difficult at all Not difficult at all Not difficult at all    Outpatient Encounter Medications as of 11/09/2019  Medication Sig Note  . albuterol (PROVENTIL) (2.5 MG/3ML) 0.083% nebulizer solution Take 3 mLs (2.5 mg total) by nebulization every 2 (two) hours as needed for wheezing or shortness of breath. 04/17/2016: PRN Only  . aspirin 81 MG tablet Take 81 mg by mouth daily.  08/04/2015: Received from: External Pharmacy  . atorvastatin (LIPITOR) 10 MG tablet Take 1 tablet (10 mg total) by mouth daily.   . clonazePAM (KLONOPIN) 1 MG tablet Take 1 tablet (1 mg total) by mouth 2 (two) times daily.   [name] diltiazem (CARDIZEM CD) 120 MG 24 hr capsule Take 1 capsule (120 mg total) by mouth daily.   [name]  doxazosin (CARDURA) 2 MG tablet Take 1 tablet (2 mg total) by mouth daily.   . furosemide (LASIX) 20 MG tablet Take 1 tablet (20 mg total) by mouth every other day.   . ipratropium-albuterol (DUONEB) 0.5-2.5 (3) MG/3ML SOLN Take 3 mLs by nebulization 3 (three) times daily. 12/31/2017: Patient reports infrequent use; does not know when he received his nebulizer; no DME maintenance being performed  . iron polysaccharides (FERREX 150) 150 MG capsule Take 1 capsule (150 mg total) by mouth daily.    No facility-administered encounter medications on file as of 11/09/2019.    Goals    .  "I want to make sure I can keep control of my anxiety"  (pt-stated)       Barriers: Pain issues faced Mental Health issues in patient with Chronic Diagnoses of CHF, CKD, COPD,  HTN, Hyperlipidemia and Anxiety State  Clinical Social Work Clinical Goal(s): Over the next 30 days client and family caregiver will verbalize understanding of long term plan for management of anxiety with panic attacks.    Interventions:  Encouraged client to talk with RNCM regarding nursing needs of client  Talked with client about his upcoming appointments  Talked with clientabout relaxation techniques(likes to have breakfast with friends several days weekly, plays guitar, enjoys visiting with family members  Talked with Steven [name] about his completion of daily ADLs.  Talked with Steven [name]  about ambulation challenges(client said he often has pain in his lower extremities when walking)  Talked with Steven [name] about social support network(son is supportive)  Talked with Steven [name] about pain issues he faces in his legs when walking  Talked with Steven [name] about socialization (client said he goes to [employer] regularly for breakfast and to visit with friends)  Talked with client about sleeping issues of client  Talked with client about vision of client  Talked with client about his management of anxiety issues  Talked with Steven [name] about support  from his son Steven [name] said that his son calls him daily)  Talked with Steven [name] about medication procurement for Steven [name] (client said his son helps him with his medications)  Talked with client about appetite of client  Talked with client about mood of client  Patient Self Care Activities:  . Participates in morning breakfast gathering with friends. [name] with family. Sees son each Saturday.   .  Talks openly about his struggles with anxiety and medications taken for anxiety.   Plan:   Client to communicate with family members as needed to discuss ongoing client needs. Client to communicate with LCSW as needed to discuss psychosocial needs of client LCSW to call client in next 4 weeks to discuss client management of anxiety and panic symptoms Client to attend scheduled medical appointments Client to communicate with RN CM as needed to discuss nursing needs of client. Client to socialize with family or friends as a way to manage anxiety or stress symptoms    Follow Up Plan: LCSW to call client in next 4 weeks to talk about client management of anxiety or panic symptoms  Steven [name] MSW, LCSW Licensed Clinical Social Worker [facility] Care Management [phone]

## 2019-11-09 NOTE — Patient Instructions (Addendum)
Licensed Clinical Education officer, museum Visit Information  Goals we discussed today:   "I want to make sure I can keep control of my anxiety"  (pt-stated)           Barriers: Pain issues faced Mental Health issues in patient with Chronic Diagnoses of CHF, CKD, COPD,  HTN, Hyperlipidemia and Anxiety State  Clinical Social Work Clinical Goal(s): Over the next 30 days client and family caregiver will verbalize understanding of long term plan for management of anxiety with panic attacks.    Interventions:  Encouraged client to talk with RNCM regarding nursing needs of client  Talked with client about his upcoming appointments  Talked with clientabout relaxation techniques(likes to have breakfast with friends several days weekly, plays guitar, enjoys visiting with family members  Talked with Anees about his completion of daily ADLs.  Talked with [name] about ambulation challenges(client said he often has pain in his lower extremities when walking)  Talked with [name] about social support network(son is supportive)  Talked with [name] about pain issues he faces in his legs when walking  Talked with [name] about socialization (client said he goes to [employer] regularly for breakfast and to visit with friends)  Talked with client about sleeping issues of client  Talked with client about vision of client  Talked with client about his management of anxiety issues  Talked with Lennell about support from his son Brosseau said that his son calls him daily)  Talked with [name] about medication procurement for [place] (client said his son helps him with his medications)  Talked with client about appetite of client  Talked with client about mood of client  Patient Self Care Activities:   Participates in morning breakfast gathering with friends.  Socializes with family. Sees son each Saturday.     Talks openly about his struggles with anxiety and medications taken for anxiety.   Plan:    Client to communicate with family members as needed to discuss ongoing client needs. Client to communicate with LCSW as needed to discuss psychosocial needs of client LCSW to call client in next 4 weeks to discuss client management of anxiety and panic symptoms Client to attend scheduled medical appointments Client to communicate with RN CM as needed to discuss nursing needs of client. Client to socialize with family or friends as a way to manage anxiety or stress symptoms    Follow Up Plan: LCSW to call client in next 4 weeks to talk about client management of anxiety or panic symptoms  Materials Provided: No  The patient verbalized understanding of instructions provided today and declined a print copy of patient instruction materials.   [name].Emmarae Cowdery MSW, LCSW Licensed Clinical Social Worker [facility] [phone]

## 2019-12-10 ENCOUNTER — Ambulatory Visit: Payer: Medicare HMO

## 2019-12-11 ENCOUNTER — Other Ambulatory Visit: Payer: Self-pay

## 2019-12-11 ENCOUNTER — Encounter: Payer: Self-pay | Admitting: Nurse Practitioner

## 2019-12-11 ENCOUNTER — Ambulatory Visit (INDEPENDENT_AMBULATORY_CARE_PROVIDER_SITE_OTHER): Payer: Medicare HMO

## 2019-12-11 ENCOUNTER — Ambulatory Visit (INDEPENDENT_AMBULATORY_CARE_PROVIDER_SITE_OTHER): Payer: Medicare HMO | Admitting: Nurse Practitioner

## 2019-12-11 VITALS — BP 138/58 | HR 81 | Temp 97.4°F | Resp 20 | Ht 67.0 in | Wt 127.2 lb

## 2019-12-11 DIAGNOSIS — R1084 Generalized abdominal pain: Secondary | ICD-10-CM | POA: Diagnosis not present

## 2019-12-11 DIAGNOSIS — K5909 Other constipation: Secondary | ICD-10-CM | POA: Diagnosis not present

## 2019-12-11 DIAGNOSIS — R109 Unspecified abdominal pain: Secondary | ICD-10-CM | POA: Diagnosis not present

## 2019-12-11 NOTE — Patient Instructions (Signed)

## 2019-12-11 NOTE — Progress Notes (Signed)
Subjective:    Patient ID: Steven [name], male    DOB: 12-Apr-1928, 86 y.o.   MRN: [id number]   Chief Complaint: Abdominal Pain   HPI Pt presents today for right-sided abdominal pain that has been present for the last 5 days. Also says that his bowel movements are hard. This has been going on for a while. He usually has a BM 1x/day. He had one yesterday but it was very hard. He has not taken anything for this and does not know what he should take. Right side has been hurting for at least 5 days. Pain is located under his ribs on the right side of his abdomen. Pain is constant except for at night when he's in bed. He describes this pain as sore. Denies any fall or injury to the area. Nothing makes the pain worse as it is always there. The only thing that makes it better is going to bed. Pain does not radiate anywhere else and the area is tender to touch. Denies n/v, fever, generalized abdominal pain, diarrhea.     Review of Systems  Constitutional: Negative.   HENT: Negative.   Eyes: Negative.   Respiratory: Negative.   Cardiovascular: Negative.   Gastrointestinal: Positive for abdominal pain (right upper quadrant) and constipation.  Genitourinary: Negative.   Musculoskeletal: Negative.   Skin: Negative.   Neurological: Negative.   Psychiatric/Behavioral: Negative.        Objective:   Physical Exam Vitals and nursing note reviewed.  Constitutional:      Appearance: He is well-developed.  HENT:     Head: Normocephalic.     Right Ear: There is impacted cerumen.     Left Ear: There is impacted cerumen.     Mouth/Throat:     Mouth: Mucous membranes are moist.  Cardiovascular:     Rate and Rhythm: Normal rate and regular rhythm.     Heart sounds: Normal heart sounds.  Pulmonary:     Effort: Pulmonary effort is normal.     Breath sounds: Normal breath sounds.  Abdominal:     General: Bowel sounds are normal.     Tenderness: There is abdominal tenderness in the right upper  quadrant.  Skin:    General: Skin is warm and dry.  Neurological:     Mental Status: He is alert. Mental status is at baseline.  Psychiatric:        Mood and Affect: Mood normal.        Behavior: Behavior normal.   BP (!) 138/58   Pulse 81   Temp (!) 97.4 F (36.3 C) (Temporal)   Resp 20   Ht 5\' 7"  (1.702 m)   Wt 127 lb 4 oz (57.7 kg)   SpO2 97%   BMI 19.93 kg/m   kub- MODERATE - SEVERE CONSTIPATION.-Preliminary reading by Ronnald Collum, [name]  [facility]     Assessment & Plan:  Camil Wilhelmsen Kelley in today with chief complaint of Abdominal Pain   1. Generalized abdominal pain  - DG Abd 1 View  2. Other constipation miralax daily Increase fiber in diet Follow up if not improving    The above assessment and management plan was discussed with the patient. The patient verbalized understanding of and has agreed to the management plan. Patient is aware to call the clinic if symptoms persist or worsen. Patient is aware when to return to the clinic for a follow-up visit. Patient educated on when it is appropriate to go to the emergency department.  Mary-Margaret Hassell Done, [name]

## 2019-12-14 ENCOUNTER — Ambulatory Visit: Payer: Medicare HMO | Admitting: Family Medicine

## 2019-12-14 ENCOUNTER — Other Ambulatory Visit: Payer: Self-pay

## 2019-12-14 ENCOUNTER — Encounter: Payer: Self-pay | Admitting: Family Medicine

## 2019-12-14 ENCOUNTER — Ambulatory Visit (INDEPENDENT_AMBULATORY_CARE_PROVIDER_SITE_OTHER): Payer: Medicare HMO | Admitting: Family Medicine

## 2019-12-14 VITALS — BP 136/72 | HR 75 | Temp 97.4°F | Ht 67.0 in | Wt 127.0 lb

## 2019-12-14 DIAGNOSIS — B029 Zoster without complications: Secondary | ICD-10-CM

## 2019-12-14 MED ORDER — VALACYCLOVIR HCL 1 G PO TABS: 1000.0000 mg | ORAL_TABLET | Freq: Two times a day (BID) | ORAL | 0 refills | Status: DC

## 2019-12-14 MED ORDER — GABAPENTIN 100 MG PO CAPS: ORAL_CAPSULE | ORAL | 0 refills | Status: DC

## 2019-12-14 NOTE — Progress Notes (Signed)
BP 136/72   Pulse 75   Temp (!) 97.4 F (36.3 C)   Ht 5\' 7"  (1.702 m)   Wt 127 lb (57.6 kg)   SpO2 98%   BMI 19.89 kg/m    Subjective:   Patient ID: Steven [name], male    DOB: [birth year], 86 y.o.   MRN: [id number]  HPI: Steven [name] is a 86 y.o. male presenting on 12/14/2019 for Abdominal Pain (RUQ) and Diarrhea   HPI Patient is coming in complaining of right upper abdominal pain.  He had an x-ray last time and the x-ray showed some constipation was treated with MiraLAX.  He has been taking the MiraLAX to where he is had watery stools and is no longer constipated but still having the pain.  Relevant past medical, surgical, family and social history reviewed and updated as indicated. Interim medical history since our last visit reviewed. Allergies and medications reviewed and updated.  Review of Systems  Constitutional: Negative for chills and fever.  Respiratory: Negative for shortness of breath and wheezing.   Cardiovascular: Negative for chest pain and leg swelling.  Gastrointestinal: Positive for abdominal pain. Negative for constipation, diarrhea, nausea and vomiting.  Skin: Positive for rash.  All other systems reviewed and are negative.   Per HPI unless specifically indicated above   Allergies as of 12/14/2019   No Known Allergies     Medication List       Accurate as of December 14, 2019  8:25 AM. If you have any questions, ask your nurse or doctor.        albuterol (2.5 MG/3ML) 0.083% nebulizer solution Commonly known as: PROVENTIL Take 3 mLs (2.5 mg total) by nebulization every 2 (two) hours as needed for wheezing or shortness of breath.   aspirin 81 MG tablet Take 81 mg by mouth daily.   atorvastatin 10 MG tablet Commonly known as: LIPITOR Take 1 tablet (10 mg total) by mouth daily.   clonazePAM 1 MG tablet Commonly known as: KLONOPIN Take 1 tablet (1 mg total) by mouth 2 (two) times daily.   diltiazem 120 MG 24 hr capsule Commonly known as:  CARDIZEM CD Take 1 capsule (120 mg total) by mouth daily.   doxazosin 2 MG tablet Commonly known as: CARDURA Take 1 tablet (2 mg total) by mouth daily.   furosemide 20 MG tablet Commonly known as: LASIX Take 1 tablet (20 mg total) by mouth every other day.   gabapentin 100 MG capsule Commonly known as: NEURONTIN Take once or twice daily for pain, if too groggy then just take at bedtime Started by: Fransisca Kaufmann Breigh Annett, MD   ipratropium-albuterol 0.5-2.5 (3) MG/3ML Soln Commonly known as: DUONEB Take 3 mLs by nebulization 3 (three) times daily.   iron polysaccharides 150 MG capsule Commonly known as: Ferrex 150 Take 1 capsule (150 mg total) by mouth daily.   valACYclovir 1000 MG tablet Commonly known as: VALTREX Take 1 tablet (1,000 mg total) by mouth 2 (two) times daily. Started by: Fransisca Kaufmann Shlomie Romig, MD        Objective:   BP 136/72   Pulse 75   Temp (!) 97.4 F (36.3 C)   Ht 5\' 7"  (1.702 m)   Wt 127 lb (57.6 kg)   SpO2 98%   BMI 19.89 kg/m   Wt Readings from Last 3 Encounters:  12/14/19 127 lb (57.6 kg)  12/11/19 127 lb 4 oz (57.7 kg)  09/15/19 129 lb (58.5 kg)    Physical  Exam Vitals and nursing note reviewed.  Constitutional:      General: He is not in acute distress.    Appearance: He is well-developed. He is not diaphoretic.  Eyes:     General: No scleral icterus.    Conjunctiva/sclera: Conjunctivae normal.  Neck:     Thyroid: No thyromegaly.  Musculoskeletal:     Cervical back: Neck supple.  Lymphadenopathy:     Cervical: No cervical adenopathy.  Skin:    General: Skin is warm and dry.     Findings: Rash (Vesicular rash on right abdomen following the nerve pattern, consistent with shingles) present.  Neurological:     Mental Status: He is alert and oriented to person, place, and time.     Coordination: Coordination normal.  Psychiatric:        Behavior: Behavior normal.       Assessment & Plan:   Problem List Items Addressed This  Visit    None    Visit Diagnoses    Herpes zoster without complication    -  Primary   Relevant Medications   valACYclovir (VALTREX) 1000 MG tablet       Follow up plan: Return if symptoms worsen or fail to improve.  Counseling provided for all of the vaccine components No orders of the defined types were placed in this encounter.   Caryl Pina, MD [facility] 12/14/2019, 8:25 AM

## 2019-12-16 ENCOUNTER — Telehealth: Payer: Medicare HMO

## 2019-12-17 ENCOUNTER — Encounter: Payer: Self-pay | Admitting: Nurse Practitioner

## 2019-12-17 ENCOUNTER — Ambulatory Visit (INDEPENDENT_AMBULATORY_CARE_PROVIDER_SITE_OTHER): Payer: Medicare HMO | Admitting: Nurse Practitioner

## 2019-12-17 ENCOUNTER — Other Ambulatory Visit: Payer: Self-pay

## 2019-12-17 VITALS — BP 139/55 | HR 87 | Temp 97.3°F | Resp 20 | Ht 67.0 in | Wt 127.0 lb

## 2019-12-17 DIAGNOSIS — I10 Essential (primary) hypertension: Secondary | ICD-10-CM | POA: Diagnosis not present

## 2019-12-17 DIAGNOSIS — J439 Emphysema, unspecified: Secondary | ICD-10-CM | POA: Diagnosis not present

## 2019-12-17 DIAGNOSIS — Z23 Encounter for immunization: Secondary | ICD-10-CM | POA: Diagnosis not present

## 2019-12-17 DIAGNOSIS — F411 Generalized anxiety disorder: Secondary | ICD-10-CM

## 2019-12-17 DIAGNOSIS — I5032 Chronic diastolic (congestive) heart failure: Secondary | ICD-10-CM

## 2019-12-17 DIAGNOSIS — E785 Hyperlipidemia, unspecified: Secondary | ICD-10-CM | POA: Diagnosis not present

## 2019-12-17 DIAGNOSIS — D649 Anemia, unspecified: Secondary | ICD-10-CM

## 2019-12-17 DIAGNOSIS — N1832 Chronic kidney disease, stage 3b: Secondary | ICD-10-CM

## 2019-12-17 DIAGNOSIS — F0390 Unspecified dementia without behavioral disturbance: Secondary | ICD-10-CM

## 2019-12-17 DIAGNOSIS — D508 Other iron deficiency anemias: Secondary | ICD-10-CM

## 2019-12-17 DIAGNOSIS — N400 Enlarged prostate without lower urinary tract symptoms: Secondary | ICD-10-CM

## 2019-12-17 MED ORDER — DILTIAZEM HCL ER COATED BEADS 120 MG PO CP24: 120.0000 mg | ORAL_CAPSULE | Freq: Every day | ORAL | 3 refills | Status: DC

## 2019-12-17 MED ORDER — ATORVASTATIN CALCIUM 10 MG PO TABS: 10.0000 mg | ORAL_TABLET | Freq: Every day | ORAL | 3 refills | Status: DC

## 2019-12-17 MED ORDER — DOXAZOSIN MESYLATE 2 MG PO TABS: 2.0000 mg | ORAL_TABLET | Freq: Every day | ORAL | 3 refills | Status: DC

## 2019-12-17 MED ORDER — POLYSACCHARIDE IRON COMPLEX 150 MG PO CAPS: 150.0000 mg | ORAL_CAPSULE | Freq: Every day | ORAL | 3 refills | Status: DC

## 2019-12-17 MED ORDER — CLONAZEPAM 1 MG PO TABS: 1.0000 mg | ORAL_TABLET | Freq: Two times a day (BID) | ORAL | 4 refills | Status: DC

## 2019-12-17 MED ORDER — FUROSEMIDE 20 MG PO TABS: 20.0000 mg | ORAL_TABLET | ORAL | 3 refills | Status: DC

## 2019-12-17 NOTE — Patient Instructions (Signed)
Stress, Adult °Stress is a normal reaction to life events. Stress is what you feel when life demands more than you are used to, or more than you think you can handle. Some stress can be useful, such as studying for a test or meeting a deadline at work. Stress that occurs too often or for too long can cause problems. It can affect your emotional health and interfere with relationships and normal daily activities. Too much stress can weaken your body's defense system (immune system) and increase your risk for physical illness. If you already have a medical problem, stress can make it worse. °What are the causes? °All sorts of life events can cause stress. An event that causes stress for one person may not be stressful for another person. Major life events, whether positive or negative, commonly cause stress. Examples include: °· Losing a job or starting a new job. °· Losing a loved one. °· Moving to a new town or home. °· Getting married or divorced. °· Having a baby. °· Getting injured or sick. °Less obvious life events can also cause stress, especially if they occur day after day or in combination with each other. Examples include: °· Working long hours. °· Driving in traffic. °· Caring for children. °· Being in debt. °· Being in a difficult relationship. °What are the signs or symptoms? °Stress can cause emotional symptoms, including: °· Anxiety. This is feeling worried, afraid, on edge, overwhelmed, or out of control. °· Anger, including irritation or impatience. °· Depression. This is feeling sad, down, helpless, or guilty. °· Trouble focusing, remembering, or making decisions. °Stress can cause physical symptoms, including: °· Aches and pains. These may affect your head, neck, back, stomach, or other areas of your body. °· Tight muscles or a clenched jaw. °· Low energy. °· Trouble sleeping. °Stress can cause unhealthy behaviors, including: °· Eating to feel better (overeating) or skipping meals. °· Working too  much or putting off tasks. °· Smoking, drinking alcohol, or using drugs to feel better. °How is this diagnosed? °Stress is diagnosed through an assessment by your health care provider. He or she may diagnose this condition based on: °· Your symptoms and any stressful life events. °· Your medical history. °· Tests to rule out other causes of your symptoms. °Depending on your condition, your health care provider may refer you to a specialist for further evaluation. °How is this treated? ° °Stress management techniques are the recommended treatment for stress. Medicine is not typically recommended for the treatment of stress. °Techniques to reduce your reaction to stressful life events include: °· Stress identification. Monitor yourself for symptoms of stress and identify what causes stress for you. These skills may help you to avoid or prepare for stressful events. °· Time management. Set your priorities, keep a calendar of events, and learn to say no. Taking these actions can help you avoid making too many commitments. °Techniques for coping with stress include: °· Rethinking the problem. Try to think realistically about stressful events rather than ignoring them or overreacting. Try to find the positives in a stressful situation rather than focusing on the negatives. °· Exercise. Physical exercise can release both physical and emotional tension. The key is to find a form of exercise that you enjoy and do it regularly. °· Relaxation techniques. These relax the body and mind. The key is to find one or more that you enjoy and use the techniques regularly. Examples include: °? Meditation, deep breathing, or progressive relaxation techniques. °? Yoga or   tai chi. ? Biofeedback, mindfulness techniques, or journaling. ? Listening to music, being out in nature, or participating in other hobbies.  Practicing a healthy lifestyle. Eat a balanced diet, drink plenty of water, limit or avoid caffeine, and get plenty of  sleep.  Having a strong support network. Spend time with family, friends, or other people you enjoy being around. Express your feelings and talk things over with someone you trust. Counseling or talk therapy with a mental health professional may be helpful if you are having trouble managing stress on your own. Follow these instructions at home: Lifestyle   Avoid drugs.  Do not use any products that contain nicotine or tobacco, such as cigarettes, e-cigarettes, and chewing tobacco. If you need help quitting, ask your health care provider.  Limit alcohol intake to no more than 1 drink a day for nonpregnant women and 2 drinks a day for men. One drink equals 12 oz of beer, 5 oz of wine, or 1 oz of hard liquor  Do not use alcohol or drugs to relax.  Eat a balanced diet that includes fresh fruits and vegetables, whole grains, lean meats, fish, eggs, and beans, and low-fat dairy. Avoid processed foods and foods high in added fat, sugar, and salt.  Exercise at least 30 minutes on 5 or more days each week.  Get 7-8 hours of sleep each night. General instructions   Practice stress management techniques as discussed with your health care provider.  Drink enough fluid to keep your urine clear or pale yellow.  Take over-the-counter and prescription medicines only as told by your health care provider.  Keep all follow-up visits as told by your health care provider. This is important. Contact a health care provider if:  Your symptoms get worse.  You have new symptoms.  You feel overwhelmed by your problems and can no longer manage them on your own. Get help right away if:  You have thoughts of hurting yourself or others. If you ever feel like you may hurt yourself or others, or have thoughts about taking your own life, get help right away. You can go to your nearest emergency department or call:  Your local emergency services (911 in the [place]  A suicide crisis helpline, such as the  [facility] at 507-293-2218. This is open 24 hours a day. Summary  Stress is a normal reaction to life events. It can cause problems if it happens too often or for too long.  Practicing stress management techniques is the best way to treat stress.  Counseling or talk therapy with a mental health professional may be helpful if you are having trouble managing stress on your own. This information is not intended to replace advice given to you by your health care provider. Make sure you discuss any questions you have with your health care provider. Document Revised: 08/22/2018 Document Reviewed: 03/14/2016 Elsevier Patient Education  [place].

## 2019-12-17 NOTE — Progress Notes (Signed)
Subjective:    Patient ID: Steven [name], male    DOB: May 22, 1928, 86 y.o.   MRN: [id number]   Chief Complaint: Medical Management of Chronic Issues (Legs weak and side hurting)    HPI:  1. Essential hypertension Does not check BP and does not watch diet. Denies chest pain, SOB, dizziness.  BP Readings from Last 3 Encounters:  12/14/19 136/72  12/11/19 (!) 138/58  09/15/19 (!) 131/57    2. Chronic diastolic congestive heart failure ([place]) Denies SOB, peripheral edema.  3. Pulmonary emphysema, unspecified emphysema type ([employer]) Denies SOB, cough. Has not needed to use his nebulizer for a while.  4. Dementia without behavioral disturbance, unspecified dementia type ([place]) Does have some confusion but son helps him do things and he is able to take care of himself at home.  5. Stage 3b chronic kidney disease ([place])  Lab Results  Component Value Date   CREATININE 1.44 (H) 09/15/2019   CREATININE 1.63 (H) 05/26/2019   CREATININE 1.68 (H) 12/11/2018    6. Hyperlipidemia with target LDL less than 100 Does not watch diet and does not exercise. Lab Results  Component Value Date   CHOL 110 09/15/2019   HDL 49 09/15/2019   LDLCALC 39 09/15/2019   TRIG 126 09/15/2019   CHOLHDL 2.2 09/15/2019    7. Anxiety state Has had some anxiety. Takes klonopin bid which helps keep him calm. GAD 7 : Generalized Anxiety Score 12/11/2019 09/15/2019 06/11/2019 12/11/2018  Nervous, Anxious, on Edge 0 1 0 1  Control/stop worrying 0 1 1 1   Worry too much - different things 0 0 1 1  Trouble relaxing 0 0 0 0  Restless 0 1 0 1  Easily annoyed or irritable 0 0 0 0  Afraid - awful might happen 0 1 1 0  Total GAD 7 Score 0 4 3 4   Anxiety Difficulty Not difficult at all Not difficult at all Not difficult at all Not difficult at all    8. Chronic anemia Takes iron pill daily. Has been taking miralax for constipation.  Lab Results  Component Value Date   HCT 32.4 (L) 09/15/2019   Lab Results   Component Value Date   HGB 10.6 (L) 09/15/2019    9. BPH (benign prostatic hyperplasia) No problems voiding.   Outpatient Encounter Medications as of 12/17/2019  Medication Sig  . albuterol (PROVENTIL) (2.5 MG/3ML) 0.083% nebulizer solution Take 3 mLs (2.5 mg total) by nebulization every 2 (two) hours as needed for wheezing or shortness of breath.  [name] aspirin 81 MG tablet Take 81 mg by mouth daily.   [name] atorvastatin (LIPITOR) 10 MG tablet Take 1 tablet (10 mg total) by mouth daily.  . clonazePAM (KLONOPIN) 1 MG tablet Take 1 tablet (1 mg total) by mouth 2 (two) times daily.  [name] diltiazem (CARDIZEM CD) 120 MG 24 hr capsule Take 1 capsule (120 mg total) by mouth daily.  [name] doxazosin (CARDURA) 2 MG tablet Take 1 tablet (2 mg total) by mouth daily.  . furosemide (LASIX) 20 MG tablet Take 1 tablet (20 mg total) by mouth every other day.  . gabapentin (NEURONTIN) 100 MG capsule Take once or twice daily for pain, if too groggy then just take at bedtime  . ipratropium-albuterol (DUONEB) 0.5-2.5 (3) MG/3ML SOLN Take 3 mLs by nebulization 3 (three) times daily.  . iron polysaccharides (FERREX 150) 150 MG capsule Take 1 capsule (150 mg total) by mouth daily.  . valACYclovir (VALTREX) 1000 MG tablet  Take 1 tablet (1,000 mg total) by mouth 2 (two) times daily.   No facility-administered encounter medications on file as of 12/17/2019.    Past Surgical History:  Procedure Laterality Date  . CARPAL TUNNEL RELEASE  1980's   right and left  . CHOLECYSTECTOMY    . COLONOSCOPY N/A 09/29/2014   Procedure: COLONOSCOPY;  Surgeon: Rogene Houston, MD;  Location: [facility];  Service: Endoscopy;  Laterality: N/A;  . CORONARY ARTERY BYPASS GRAFT  1999   5 grafts  . FLEXIBLE BRONCHOSCOPY  11/07/2011   Procedure: FLEXIBLE BRONCHOSCOPY;  Surgeon: Gaye Pollack, MD;  Location: [facility];  Service: Thoracic;  Laterality: N/A;  . HERNIA REPAIR  [id number]   umbilical hernia repair  . JOINT REPLACEMENT  2000   right  shoulder rotator cuff repair    Family History  Problem Relation Age of Onset  . Cancer Mother        pancreatic  . Pulmonary embolism Father   . Anxiety disorder Other     New complaints: Has been having pain in his legs which is causing trouble with walking. Once he starts walking he's okay but it is hard for him to initially get up. He would like some sort of pain pill for his legs. Says abdomen is feeling better.  Social history: Lives by himself. Son helps him.  Controlled substance contract: 05/27/19   Review of Systems  Constitutional: Negative.   HENT: Negative.   Eyes: Negative.   Respiratory: Negative.   Cardiovascular: Negative.   Gastrointestinal: Negative.   Genitourinary: Negative.   Musculoskeletal: Positive for gait problem (legs hurt when he stands up and he's afraid he will fall).  Skin: Positive for wound (scabs on right side of abd and lower back).  Psychiatric/Behavioral: Negative.        Objective:   Physical Exam Vitals and nursing note reviewed.  Constitutional:      Appearance: Normal appearance.  HENT:     Head: Normocephalic.     Right Ear: There is impacted cerumen.     Left Ear: There is impacted cerumen.     Nose: Nose normal.     Mouth/Throat:     Mouth: Mucous membranes are moist.     Pharynx: Oropharynx is clear.  Eyes:     Conjunctiva/sclera: Conjunctivae normal.  Cardiovascular:     Rate and Rhythm: Normal rate and regular rhythm.     Pulses: Normal pulses.     Heart sounds: Murmur heard.   Pulmonary:     Effort: Pulmonary effort is normal.     Breath sounds: Wheezing (Left upper lobe) present.  Abdominal:     General: Bowel sounds are normal.     Palpations: Abdomen is soft.  Musculoskeletal:        General: Normal range of motion.     Cervical back: Normal range of motion.  Skin:    General: Skin is warm and dry.     Findings: Lesion (across abdomen and back on right side) present.  Neurological:     Mental  Status: He is alert. Mental status is at baseline.  Psychiatric:        Mood and Affect: Mood normal.        Behavior: Behavior normal.   BP (!) 139/55   Pulse 87   Temp (!) 97.3 F (36.3 C) (Temporal)   Resp 20   Ht 5' 7"  (1.702 m)   Wt 127 lb (57.6 kg)   SpO2  93%   BMI 19.89 kg/m      Assessment & Plan:  TIGRAN HAYNIE comes in today with chief complaint of Medical Management of Chronic Issues (Legs weak and side hurting)   Diagnosis and orders addressed:  1. Essential hypertension Low salt diet  2. Chronic diastolic congestive heart failure ([facility]) Report SOB or peripheral edema  3. Pulmonary emphysema, unspecified emphysema type ([facility]) Use nebulizer PRN SOB, wheezing, cough  4. Dementia without behavioral disturbance, unspecified dementia type ([place])   5. Stage 3b chronic kidney disease ([place])   6. Hyperlipidemia with target LDL less than 100 Low fat diet  7. Anxiety state Stress management  8. Chronic anemia   9. Benign prostatic hyperplasia without lower urinary tract symptoms  Meds ordered this encounter  Medications  . clonazePAM (KLONOPIN) 1 MG tablet    Sig: Take 1 tablet (1 mg total) by mouth 2 (two) times daily.    Dispense:  60 tablet    Refill:  4    Order Specific Question:   Supervising Provider    Answer:   Caryl Pina A [id number]  . doxazosin (CARDURA) 2 MG tablet    Sig: Take 1 tablet (2 mg total) by mouth daily.    Dispense:  30 tablet    Refill:  3    Order Specific Question:   Supervising Provider    Answer:   Caryl Pina A [id number]  . diltiazem (CARDIZEM CD) 120 MG 24 hr capsule    Sig: Take 1 capsule (120 mg total) by mouth daily.    Dispense:  30 capsule    Refill:  3    Order Specific Question:   Supervising Provider    Answer:   Caryl Pina A [id number]  . furosemide (LASIX) 20 MG tablet    Sig: Take 1 tablet (20 mg total) by mouth every other day.    Dispense:  30 tablet    Refill:  3    Order Specific  Question:   Supervising Provider    Answer:   Caryl Pina A [id number]  . atorvastatin (LIPITOR) 10 MG tablet    Sig: Take 1 tablet (10 mg total) by mouth daily.    Dispense:  30 tablet    Refill:  3    Order Specific Question:   Supervising Provider    Answer:   Caryl Pina A [id number]  . iron polysaccharides (FERREX 150) 150 MG capsule    Sig: Take 1 capsule (150 mg total) by mouth daily.    Dispense:  30 capsule    Refill:  3    Order Specific Question:   Supervising Provider    Answer:   Caryl Pina A [[id number]]   Orders Placed This Encounter  Procedures  . CBC with Differential/Platelet  . CMP14+EGFR  . Lipid panel     Labs pending Health Maintenance reviewed Diet and exercise encouraged  Follow up plan: 3 months   Mary-Margaret Hassell Done, [name]

## 2019-12-19 ENCOUNTER — Encounter (HOSPITAL_COMMUNITY): Payer: Self-pay | Admitting: Emergency Medicine

## 2019-12-19 ENCOUNTER — Emergency Department (HOSPITAL_COMMUNITY)
Admission: EM | Admit: 2019-12-19 | Discharge: 2019-12-19 | Disposition: A | Discharge status: Home / Self Care | Payer: Medicare HMO | Attending: Emergency Medicine | Admitting: Emergency Medicine

## 2019-12-19 ENCOUNTER — Other Ambulatory Visit: Payer: Self-pay

## 2019-12-19 ENCOUNTER — Emergency Department (HOSPITAL_COMMUNITY): Payer: Medicare HMO

## 2019-12-19 DIAGNOSIS — N183 Chronic kidney disease, stage 3 unspecified: Secondary | ICD-10-CM | POA: Insufficient documentation

## 2019-12-19 DIAGNOSIS — I959 Hypotension, unspecified: Secondary | ICD-10-CM | POA: Diagnosis not present

## 2019-12-19 DIAGNOSIS — Z951 Presence of aortocoronary bypass graft: Secondary | ICD-10-CM | POA: Diagnosis not present

## 2019-12-19 DIAGNOSIS — R531 Weakness: Secondary | ICD-10-CM

## 2019-12-19 DIAGNOSIS — Z87891 Personal history of nicotine dependence: Secondary | ICD-10-CM | POA: Insufficient documentation

## 2019-12-19 DIAGNOSIS — R29818 Other symptoms and signs involving the nervous system: Secondary | ICD-10-CM | POA: Diagnosis not present

## 2019-12-19 DIAGNOSIS — F0390 Unspecified dementia without behavioral disturbance: Secondary | ICD-10-CM | POA: Insufficient documentation

## 2019-12-19 DIAGNOSIS — Z96611 Presence of right artificial shoulder joint: Secondary | ICD-10-CM | POA: Diagnosis not present

## 2019-12-19 DIAGNOSIS — I509 Heart failure, unspecified: Secondary | ICD-10-CM | POA: Diagnosis not present

## 2019-12-19 DIAGNOSIS — J449 Chronic obstructive pulmonary disease, unspecified: Secondary | ICD-10-CM | POA: Diagnosis not present

## 2019-12-19 DIAGNOSIS — Z79899 Other long term (current) drug therapy: Secondary | ICD-10-CM | POA: Diagnosis not present

## 2019-12-19 DIAGNOSIS — Z85118 Personal history of other malignant neoplasm of bronchus and lung: Secondary | ICD-10-CM | POA: Insufficient documentation

## 2019-12-19 DIAGNOSIS — I130 Hypertensive heart and chronic kidney disease with heart failure and stage 1 through stage 4 chronic kidney disease, or unspecified chronic kidney disease: Secondary | ICD-10-CM | POA: Insufficient documentation

## 2019-12-19 DIAGNOSIS — Z7951 Long term (current) use of inhaled steroids: Secondary | ICD-10-CM | POA: Diagnosis not present

## 2019-12-19 DIAGNOSIS — G319 Degenerative disease of nervous system, unspecified: Secondary | ICD-10-CM | POA: Diagnosis not present

## 2019-12-19 DIAGNOSIS — R52 Pain, unspecified: Secondary | ICD-10-CM | POA: Diagnosis not present

## 2019-12-19 LAB — COMPREHENSIVE METABOLIC PANEL
ALT: 17 U/L (ref 0–44)
AST: 19 U/L (ref 15–41)
Albumin: 3.7 g/dL (ref 3.5–5.0)
Alkaline Phosphatase: 71 U/L (ref 38–126)
Anion gap: 7 (ref 5–15)
BUN: 28 mg/dL — ABNORMAL HIGH (ref 8–23)
CO2: 28 mmol/L (ref 22–32)
Calcium: 9.5 mg/dL (ref 8.9–10.3)
Chloride: 101 mmol/L (ref 98–111)
Creatinine, Ser: 1.59 mg/dL — ABNORMAL HIGH (ref 0.61–1.24)
GFR, Estimated: 41 mL/min — ABNORMAL LOW (ref >60)
Glucose, Bld: 107 mg/dL — ABNORMAL HIGH (ref 70–99)
Potassium: 4.2 mmol/L (ref 3.5–5.1)
Sodium: 136 mmol/L (ref 135–145)
Total Bilirubin: 0.3 mg/dL (ref 0.3–1.2)
Total Protein: 7.6 g/dL (ref 6.5–8.1)

## 2019-12-19 LAB — URINALYSIS, ROUTINE W REFLEX MICROSCOPIC
Bacteria, UA: NONE SEEN
Bilirubin Urine: NEGATIVE
Glucose, UA: NEGATIVE mg/dL
Ketones, ur: NEGATIVE mg/dL
Leukocytes,Ua: NEGATIVE
Nitrite: NEGATIVE
Protein, ur: NEGATIVE mg/dL
Specific Gravity, Urine: 1.009 (ref 1.005–1.030)
pH: 5 (ref 5.0–8.0)

## 2019-12-19 LAB — CBC
HCT: 34.5 % — ABNORMAL LOW (ref 39.0–52.0)
Hemoglobin: 10.7 g/dL — ABNORMAL LOW (ref 13.0–17.0)
MCH: 30.7 pg (ref 26.0–34.0)
MCHC: 31 g/dL (ref 30.0–36.0)
MCV: 99.1 fL (ref 80.0–100.0)
Platelets: 147 10*3/uL — ABNORMAL LOW (ref 150–400)
RBC: 3.48 MIL/uL — ABNORMAL LOW (ref 4.22–5.81)
RDW: 13.3 % (ref 11.5–15.5)
WBC: 4.1 10*3/uL (ref 4.0–10.5)
nRBC: 0 % (ref 0.0–0.2)

## 2019-12-19 NOTE — ED Notes (Signed)
Provided pt with phone to call family for transport home.

## 2019-12-19 NOTE — Discharge Instructions (Addendum)
It was our pleasure to provide your ER care today - we hope that you feel better.  Stop taking the valtrex medication (as occasionally it causes neurologic symptoms especially in older patients with renal insufficiency).   Drink plenty of fluids.  Fall precautions.  Follow up with primary care doctor this coming week if symptoms fail to improve/resolve.  Return to ER if worse, new symptoms, fevers, new or severe pain, trouble breathing, change in speech or vision, one-side of body numbness/weakness, or other concern.

## 2019-12-19 NOTE — ED Triage Notes (Signed)
Per [employer], pt reports difficulty walking since 8am this am. Pt alert and oriented at baseline, from home. Pt denies any known injury or other symptoms.

## 2019-12-19 NOTE — ED Provider Notes (Addendum)
[facility] EMERGENCY DEPARTMENT Provider Note   CSN: [id number] Arrival date & time: 12/19/19  1705     History Chief Complaint  Patient presents with  . Weakness    Steven [name] is a 86 y.o. male.  Patient c/o general weakness for the past several days.  Symptoms gradual onset, moderate, constant, persistent. States he noticed the weakness has affected his ability to walk, states when he stands, he 'just cant go'. Denies abrupt change today. Denies change in speech or vision. Denies numbness or unilateral weakness. Denies headache. No neck or back pain. No radicular pain. States was recently txd for shingles but has completed that medication. Denies fever or chills.   The history is provided by the patient and the EMS personnel.  Weakness Associated symptoms: no abdominal pain, no chest pain, no cough, no dysuria, no fever, no headaches, no shortness of breath and no vomiting        Past Medical History:  Diagnosis Date  . Anxiety   . Cancer [facility]) 2013   Lung - cancer free x3 years.   . Hyperlipidemia   . Hypertension   . Myocardial infarction [facility]) 1999    Patient Active Problem List   Diagnosis Date Noted  . CHF (congestive heart failure) ([place]) 08/27/2017  . Dementia ([place]) 08/27/2017  . Chronic anemia 08/27/2017  . Sundowning 08/09/2017  . Goals of care, counseling/discussion   . Palliative care by specialist   . DNR (do not resuscitate) discussion   . Abdominal bruit 10/15/2014  . CKD (chronic kidney disease), stage III ([place]) 09/28/2014  . BPH (benign prostatic hyperplasia) 02/25/2013  . Squamous cell carcinoma of lung ([place]) 11/13/2011  . COPD with emphysema ([place]) 07/29/2007  . Hyperlipidemia with target LDL less than 100 07/28/2007  . Anxiety state 07/28/2007  . ERECTILE DYSFUNCTION 07/28/2007  . Essential hypertension 07/28/2007  . Coronary atherosclerosis 07/28/2007    Past Surgical History:  Procedure Laterality Date  . CARPAL TUNNEL RELEASE   1980's   right and left  . CHOLECYSTECTOMY    . COLONOSCOPY N/A 09/29/2014   Procedure: COLONOSCOPY;  Surgeon: Rogene Houston, MD;  [facility];  Service: Endoscopy;  Laterality: N/A;  . CORONARY ARTERY BYPASS GRAFT  1999   5 grafts  . FLEXIBLE BRONCHOSCOPY  11/07/2011   Procedure: FLEXIBLE BRONCHOSCOPY;  Surgeon: Gaye Pollack, MD;  Location: [place];  Service: Thoracic;  Laterality: N/A;  . HERNIA REPAIR  9371   umbilical hernia repair  . JOINT REPLACEMENT  2000   right shoulder rotator cuff repair       Family History  Problem Relation Age of Onset  . Cancer Mother        pancreatic  . Pulmonary embolism Father   . Anxiety disorder Other     Social History   Tobacco Use  . Smoking status: Former Smoker    Packs/day: 2.00    Years: 73.00    Pack years: 146.00    Types: Cigarettes    Start date: 02/05/2006    Quit date: 11/05/2006    Years since quitting: 13.1  . Smokeless tobacco: Current User  . Tobacco comment: uses camel patches  Vaping Use  . Vaping Use: Never used  Substance Use Topics  . Alcohol use: No  . Drug use: No    Home Medications Prior to Admission medications   Medication Sig Start Date End Date Taking? Authorizing Provider  albuterol (PROVENTIL) (2.5 MG/3ML) 0.083% nebulizer solution Take  3 mLs (2.5 mg total) by nebulization every 2 (two) hours as needed for wheezing or shortness of breath. 03/18/16   Laqueta Linden, MD  aspirin 81 MG tablet Take 81 mg by mouth daily.  07/19/15   [provider]  atorvastatin (LIPITOR) 10 MG tablet Take 1 tablet (10 mg total) by mouth daily. 12/17/19   Hassell Done, Mary-Margaret, [name]  clonazePAM (KLONOPIN) 1 MG tablet Take 1 tablet (1 mg total) by mouth 2 (two) times daily. 12/17/19   Hassell Done, Mary-Margaret, [name]  diltiazem (CARDIZEM CD) 120 MG 24 hr capsule Take 1 capsule (120 mg total) by mouth daily. 12/17/19   Hassell Done, Mary-Margaret, [name]  doxazosin (CARDURA) 2 MG tablet Take 1 tablet (2 mg total)  by mouth daily. 12/17/19   Hassell Done, Mary-Margaret, [name]  furosemide (LASIX) 20 MG tablet Take 1 tablet (20 mg total) by mouth every other day. 12/17/19   [name], [name]  gabapentin (NEURONTIN) 100 MG capsule Take once or twice daily for pain, if too groggy then just take at bedtime 12/14/19   Dettinger, Fransisca Kaufmann, MD  ipratropium-albuterol (DUONEB) 0.5-2.5 (3) MG/3ML SOLN Take 3 mLs by nebulization 3 (three) times daily. 11/04/17   Hassell Done Mary-Margaret, [name]  iron polysaccharides (FERREX 150) 150 MG capsule Take 1 capsule (150 mg total) by mouth daily. 12/17/19   Hassell Done, Mary-Margaret, [name]  valACYclovir (VALTREX) 1000 MG tablet Take 1 tablet (1,000 mg total) by mouth 2 (two) times daily. 12/14/19   Dettinger, Fransisca Kaufmann, MD    Allergies    Patient has no known allergies.  Review of Systems   Review of Systems  Constitutional: Negative for chills and fever.  HENT: Negative for sore throat and trouble swallowing.   Eyes: Negative for visual disturbance.  Respiratory: Negative for cough and shortness of breath.   Cardiovascular: Negative for chest pain.  Gastrointestinal: Negative for abdominal pain and vomiting.  Genitourinary: Negative for dysuria and flank pain.  Musculoskeletal: Negative for back pain and neck pain.  Skin: Negative for rash.  Neurological: Positive for weakness. Negative for speech difficulty and headaches.  Hematological: Does not bruise/bleed easily.  Psychiatric/Behavioral: Negative for confusion.    Physical Exam Updated Vital Signs BP 128/88   Pulse 64   Temp 97.8 F (36.6 C)   Resp 18   Ht 1.702 m (5\' 7" )   Wt 57 kg   SpO2 98%   BMI 19.68 kg/m   Physical Exam Vitals and nursing note reviewed.  Constitutional:      Appearance: Normal appearance. He is well-developed.  HENT:     Head: Atraumatic.     Nose: Nose normal.     Mouth/Throat:     Mouth: Mucous membranes are moist.     Pharynx: Oropharynx is clear.  Eyes:     General: No scleral  icterus.    Conjunctiva/sclera: Conjunctivae normal.     Pupils: Pupils are equal, round, and reactive to light.  Neck:     Vascular: No carotid bruit.     Trachea: No tracheal deviation.  Cardiovascular:     Rate and Rhythm: Normal rate and regular rhythm.     Pulses: Normal pulses.     Heart sounds: Normal heart sounds. No murmur heard.  No friction rub. No gallop.   Pulmonary:     Effort: Pulmonary effort is normal. No accessory muscle usage or respiratory distress.     Breath sounds: Normal breath sounds.  Abdominal:     General: Bowel sounds are normal. There  is no distension.     Palpations: Abdomen is soft.     Tenderness: There is no abdominal tenderness. There is no guarding.  Genitourinary:    Comments: No cva tenderness. Musculoskeletal:        General: No swelling.     Cervical back: Normal range of motion and neck supple. No rigidity.  Skin:    General: Skin is warm and dry.     Findings: No rash.     Comments: Scabbed over, erythematous lesions to right abd c/w resolving shingles. No cellulitis.   Neurological:     Mental Status: He is alert.     Comments: Alert, speech clear, fluent, no gross dysarthria or aphasia. Motor intact bil, stre 5/5. Sens grossly intact bil.   Psychiatric:        Mood and Affect: Mood normal.     ED Results / Procedures / Treatments   Labs (all labs ordered are listed, but only abnormal results are displayed) Results for orders placed or performed during the hospital encounter of 12/19/19  Comprehensive metabolic panel  Result Value Ref Range   Sodium 136 135 - 145 mmol/L   Potassium 4.2 3.5 - 5.1 mmol/L   Chloride 101 98 - 111 mmol/L   CO2 28 22 - 32 mmol/L   Glucose, Bld 107 (H) 70 - 99 mg/dL   BUN 28 (H) 8 - 23 mg/dL   Creatinine, Ser 1.59 (H) 0.61 - 1.24 mg/dL   Calcium 9.5 8.9 - 10.3 mg/dL   Total Protein 7.6 6.5 - 8.1 g/dL   Albumin 3.7 3.5 - 5.0 g/dL   AST 19 15 - 41 U/L   ALT 17 0 - 44 U/L   Alkaline Phosphatase 71  38 - 126 U/L   Total Bilirubin 0.3 0.3 - 1.2 mg/dL   GFR, Estimated 41 (L) >60 mL/min   Anion gap 7 5 - 15  CBC  Result Value Ref Range   WBC 4.1 4.0 - 10.5 K/uL   RBC 3.48 (L) 4.22 - 5.81 MIL/uL   Hemoglobin 10.7 (L) 13.0 - 17.0 g/dL   HCT 34.5 (L) 39 - 52 %   MCV 99.1 80.0 - 100.0 fL   MCH 30.7 26.0 - 34.0 pg   MCHC 31.0 30.0 - 36.0 g/dL   RDW 13.3 11.5 - 15.5 %   Platelets 147 (L) 150 - 400 K/uL   nRBC 0.0 0.0 - 0.2 %  Urinalysis, Routine w reflex microscopic Urine, Clean Catch  Result Value Ref Range   Color, Urine YELLOW YELLOW   APPearance CLEAR CLEAR   Specific Gravity, Urine 1.009 1.005 - 1.030   pH 5.0 5.0 - 8.0   Glucose, UA NEGATIVE NEGATIVE mg/dL   Hgb urine dipstick SMALL (A) NEGATIVE   Bilirubin Urine NEGATIVE NEGATIVE   Ketones, ur NEGATIVE NEGATIVE mg/dL   Protein, ur NEGATIVE NEGATIVE mg/dL   Nitrite NEGATIVE NEGATIVE   Leukocytes,Ua NEGATIVE NEGATIVE   RBC / HPF 0-5 0 - 5 RBC/hpf   WBC, UA 0-5 0 - 5 WBC/hpf   Bacteria, UA NONE SEEN NONE SEEN   Mucus PRESENT    Hyaline Casts, UA PRESENT    DG Abd 1 View  Result Date: 12/14/2019 CLINICAL DATA:  Abdominal pain EXAM: ABDOMEN - 1 VIEW COMPARISON:  None. FINDINGS: Scattered large and small bowel gas is noted. Mild retained fecal material is seen without obstructive change. Mild degenerative changes of lumbar spine are noted. Aortoiliac calcifications are seen without aneurysmal dilatation. No  acute abnormality is noted. IMPRESSION: No acute abnormality noted. Electronically Signed   By: Inez Catalina M.D.   On: 12/14/2019 09:21   CT HEAD WO CONTRAST  Result Date: 12/19/2019 CLINICAL DATA:  86 year old male with neurologic deficit. EXAM: CT HEAD WITHOUT CONTRAST TECHNIQUE: Contiguous axial images were obtained from the base of the skull through the vertex without intravenous contrast. COMPARISON:  Head CT dated 03/15/2016. FINDINGS: Brain: Moderate age-related atrophy and chronic microvascular ischemic changes.  There is no acute intracranial hemorrhage. No mass effect or midline shift. No extra-axial fluid collection. Vascular: No hyperdense vessel or unexpected calcification. Skull: Normal. Negative for fracture or focal lesion. Sinuses/Orbits: No acute finding. Cerumen noted in the external auditory canals. Other: None IMPRESSION: 1. No acute intracranial pathology. 2. Moderate age-related atrophy and chronic microvascular ischemic changes. Electronically Signed   By: Anner Crete M.D.   On: 12/19/2019 18:15    EKG None  Radiology CT HEAD WO CONTRAST  Result Date: 12/19/2019 CLINICAL DATA:  86 year old male with neurologic deficit. EXAM: CT HEAD WITHOUT CONTRAST TECHNIQUE: Contiguous axial images were obtained from the base of the skull through the vertex without intravenous contrast. COMPARISON:  Head CT dated 03/15/2016. FINDINGS: Brain: Moderate age-related atrophy and chronic microvascular ischemic changes. There is no acute intracranial hemorrhage. No mass effect or midline shift. No extra-axial fluid collection. Vascular: No hyperdense vessel or unexpected calcification. Skull: Normal. Negative for fracture or focal lesion. Sinuses/Orbits: No acute finding. Cerumen noted in the external auditory canals. Other: None IMPRESSION: 1. No acute intracranial pathology. 2. Moderate age-related atrophy and chronic microvascular ischemic changes. Electronically Signed   By: Anner Crete M.D.   On: 12/19/2019 18:15    Procedures Procedures (including critical care time)  Medications Ordered in ED Medications - No data to display  ED Course  I have reviewed the triage vital signs and the nursing notes.  Pertinent labs & imaging results that were available during my care of the patient were reviewed by me and considered in my medical decision making (see chart for details).    MDM Rules/Calculators/A&P                          Labs sent.  Reviewed nursing notes and prior charts for  additional history.   Labs reviewed/interpreted by me - chem normal. Wbc normal.  CT reviewed/intepreted by me - no hem.   ?possible neurotoxicity related to recent valtrex rx.   Po fluids. Ambulate in hall.  Pt currently appears stable for d/c.   Rec pcp f/u.  Return precautions provided.        Final Clinical Impression(s) / ED Diagnoses Final diagnoses:  None    Rx / DC Orders ED Discharge Orders    None           Lajean Saver, MD 12/19/19 [phone]

## 2019-12-19 NOTE — ED Notes (Signed)
Pt ambulated to restroom. Pt ambulated well with a slight limp. Pt states his left knee is bothering him.

## 2020-01-10 IMAGING — DX DG CHEST 1V
1 series · 1 of 1 positions shown · non-contrast
Comparison: August 06, 2017

CLINICAL DATA: Shortness of breath

EXAM:
CHEST  1 VIEW

[chest ap]
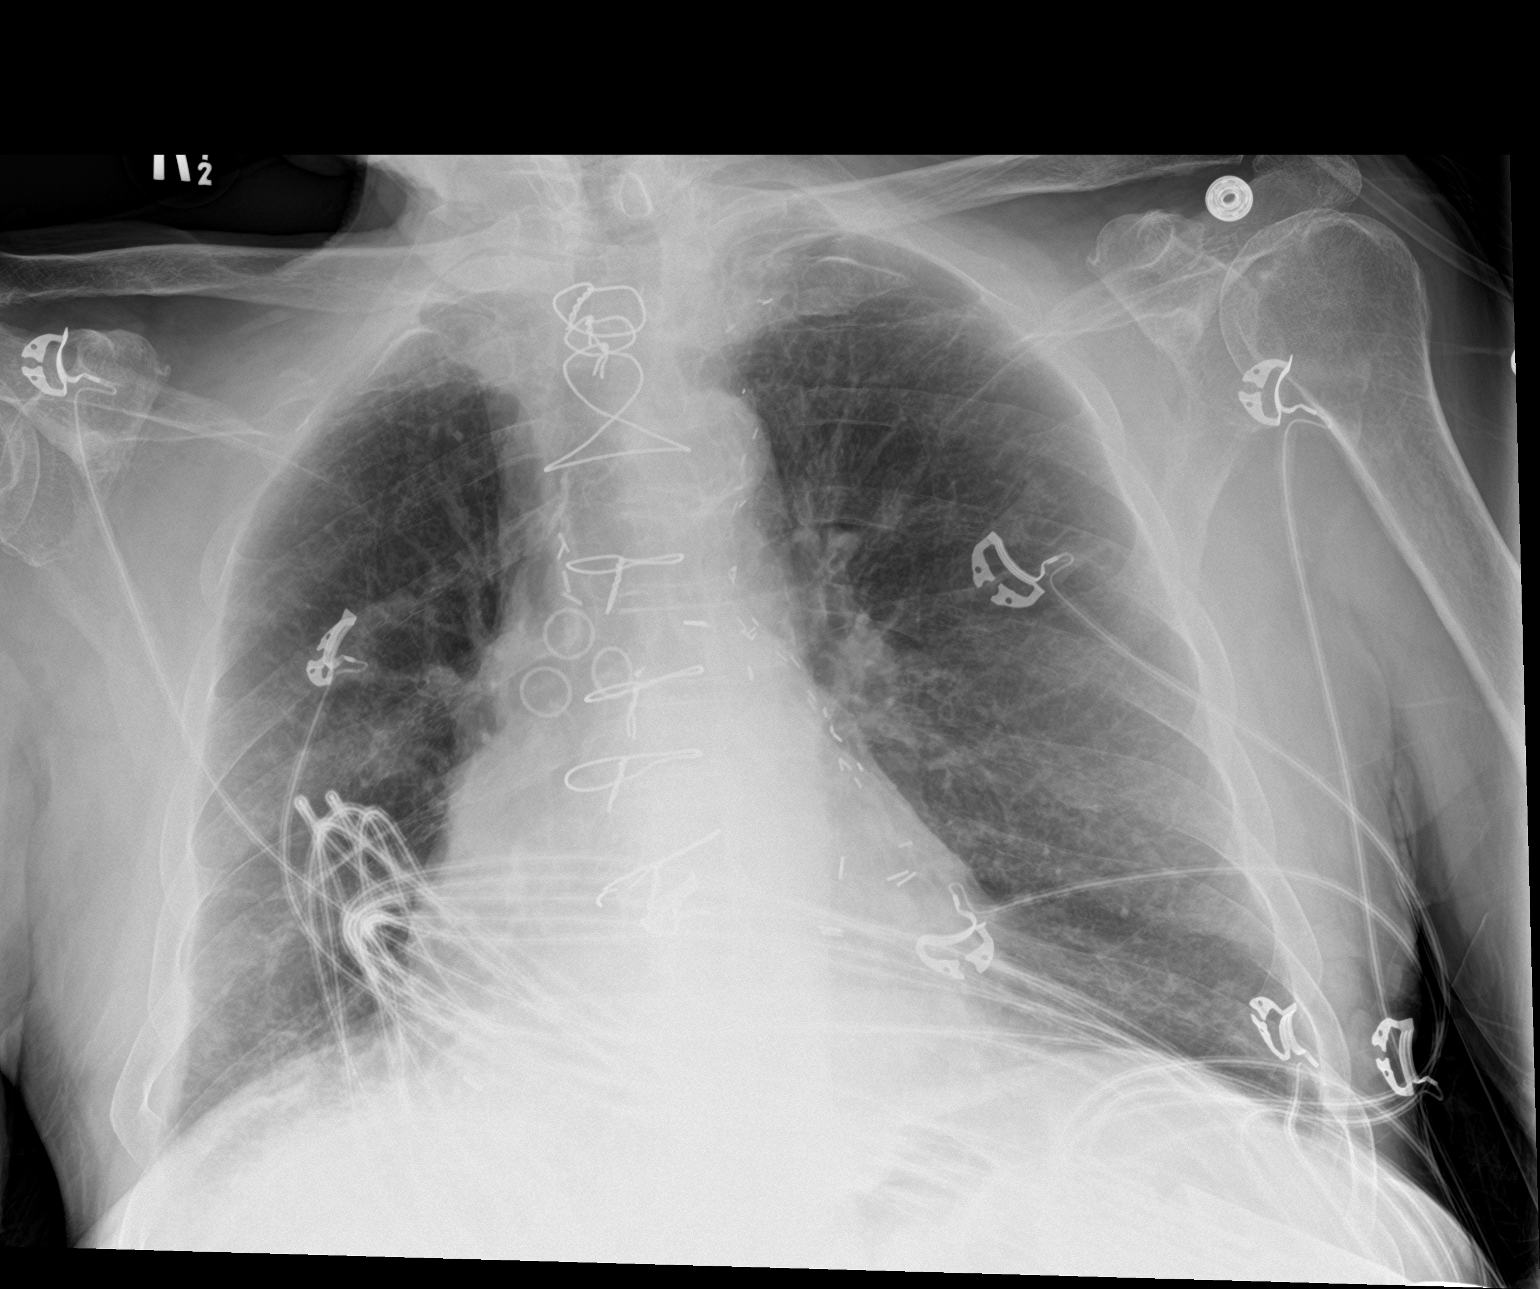

[1 of 1 positions shown; findings below may reference images not displayed]

FINDINGS: There is somewhat less patchy airspace opacity in the right base
compared to 1 day prior. There is atelectatic change in each lung
base currently. Heart is upper normal in size with pulmonary
vascularity normal. Patient is status post coronary artery bypass
grafting. No adenopathy. No evident lesions.
IMPRESSION: Partial clearing right base. Mild bibasilar atelectasis. Stable
cardiac silhouette.

## 2020-01-20 ENCOUNTER — Ambulatory Visit: Payer: Medicare HMO | Admitting: Licensed Clinical Social Worker

## 2020-01-20 DIAGNOSIS — I10 Essential (primary) hypertension: Secondary | ICD-10-CM

## 2020-01-20 DIAGNOSIS — F411 Generalized anxiety disorder: Secondary | ICD-10-CM

## 2020-01-20 DIAGNOSIS — I5032 Chronic diastolic (congestive) heart failure: Secondary | ICD-10-CM

## 2020-01-20 DIAGNOSIS — J439 Emphysema, unspecified: Secondary | ICD-10-CM

## 2020-01-20 DIAGNOSIS — N1832 Chronic kidney disease, stage 3b: Secondary | ICD-10-CM

## 2020-01-20 NOTE — Patient Instructions (Addendum)
Licensed Clinical Education officer, museum Visit Information  Goals we discussed today:    "I want to make sure I can keep control of my anxiety"  (pt-stated)         Barriers: Pain issues faced Mental Health issues in patient with Chronic Diagnoses of CHF, CKD, COPD,  HTN, Hyperlipidemia and Anxiety State  Clinical Social Work Clinical Goal(s): Over the next 30 days client and family caregiver will verbalize understanding of long term plan for management of anxiety with panic attacks.    Interventions:  Talked with client about current needs of client Talked with client about pain issues of client Talked with client about medication procurement of client Encouraged client to talk with RNCM regarding nursing needs of client Talked with client about vision of client Talked with client about meal provision of client Talked with client about transport needs of client Talked with client about mobility of client Talked with client about mobility of his arms,shoulders Talked with client about his completion of ADLs Talked with client about support from his son  Patient Self Care Activities:   Participates in morning breakfast gathering with friends.  Socializes with family. Sees son each Saturday.     Talks openly about his struggles with anxiety and medications taken for anxiety.   Plan:   Client to communicate with family members as needed to discuss ongoing client needs. Client to communicate with LCSW as needed to discuss psychosocial needs of client LCSW to call client in next 3 weeks to discuss client management of anxiety and panic symptoms Client to attend scheduled medical appointments Client to communicate with RN CM as needed to discuss nursing needs of client. Client to socialize with family or friends as a way to manage anxiety or stress symptoms           Follow Up Plan:LCSW to call client in next 4 weeks to talk about client management of anxiety or panic  symptoms  Materials Provided: No  The patient verbalized understanding of instructions provided today and declined a print copy of patient instruction materials.   [name].Ahja Martello MSW, LCSW Licensed Clinical Social Worker [facility] Care Management [phone]

## 2020-01-20 NOTE — Chronic Care Management (AMB) (Signed)
Chronic Care Management    Clinical Social Work Follow Up Note  01/20/2020 Name: Steven [name] MRN: [id number] DOB: Jun 18, 1928  Steven [name] is a 86 y.o. year old male who is a primary care patient of [name], [facility]. The CCM team was consulted for assistance with [employer] .   Review of patient status, including review of consultants reports, other relevant assessments, and collaboration with appropriate care team members and the patient's provider was performed as part of comprehensive patient evaluation and provision of chronic care management services.    SDOH (Social Determinants of Health) assessments performed: No; risk for depression; risk for tobacco use; risk for stress; risk for physical inactivity  [place] Office Visit from 02/11/2019 in [place]  PHQ-9 Total Score 5       GAD 7 : Generalized Anxiety Score 12/11/2019 09/15/2019 06/11/2019 12/11/2018  Nervous, Anxious, on Edge 0 1 0 1  Control/stop worrying 0 1 1 1   Worry too much - different things 0 0 1 1  Trouble relaxing 0 0 0 0  Restless 0 1 0 1  Easily annoyed or irritable 0 0 0 0  Afraid - awful might happen 0 1 1 0  Total GAD 7 Score 0 4 3 4   Anxiety Difficulty Not difficult at all Not difficult at all Not difficult at all Not difficult at all    Outpatient Encounter Medications as of 01/20/2020  Medication Sig Note  . albuterol (PROVENTIL) (2.5 MG/3ML) 0.083% nebulizer solution Take 3 mLs (2.5 mg total) by nebulization every 2 (two) hours as needed for wheezing or shortness of breath. 04/17/2016: PRN Only  . aspirin 81 MG tablet Take 81 mg by mouth daily.  08/04/2015: Received from: External Pharmacy  . atorvastatin (LIPITOR) 10 MG tablet Take 1 tablet (10 mg total) by mouth daily.   . clonazePAM (KLONOPIN) 1 MG tablet Take 1 tablet (1 mg total) by mouth 2 (two) times daily.   [name] diltiazem (CARDIZEM CD) 120 MG 24 hr capsule Take 1 capsule (120 mg total) by mouth  daily.   [name] doxazosin (CARDURA) 2 MG tablet Take 1 tablet (2 mg total) by mouth daily.   . furosemide (LASIX) 20 MG tablet Take 1 tablet (20 mg total) by mouth every other day.   . gabapentin (NEURONTIN) 100 MG capsule Take once or twice daily for pain, if too groggy then just take at bedtime   . ipratropium-albuterol (DUONEB) 0.5-2.5 (3) MG/3ML SOLN Take 3 mLs by nebulization 3 (three) times daily. 12/31/2017: Patient reports infrequent use; does not know when he received his nebulizer; no DME maintenance being performed  . iron polysaccharides (FERREX 150) 150 MG capsule Take 1 capsule (150 mg total) by mouth daily.    No facility-administered encounter medications on file as of 01/20/2020.   Goals    .  "I want to make sure I can keep control of my anxiety"  (pt-stated)       Barriers: Pain issues faced Mental Health issues in patient with Chronic Diagnoses of CHF, CKD, COPD,  HTN, Hyperlipidemia and Anxiety State  Clinical Social Work Clinical Goal(s): Over the next 30 days client and family caregiver will verbalize understanding of long term plan for management of anxiety with panic attacks.    Interventions:  Talked with client about current needs of client Talked with client about pain issues of client Talked with client about medication procurement of client Encouraged client to talk with [facility] regarding nursing  needs of client Talked with client about vision of client Talked with client about meal provision of client Talked with client about transport needs of client Talked with client about mobility of client Talked with client about mobility of his arms,shoulders Talked with client about his completion of ADLs Talked with client about support from his son  Patient Self Care Activities:  . Participates in morning breakfast gathering with friends. [name] with family. Sees son each Saturday.   .  Talks openly about his struggles with anxiety and medications taken for  anxiety.   Plan:   Client to communicate with family members as needed to discuss ongoing client needs. Client to communicate with LCSW as needed to discuss psychosocial needs of client LCSW to call client in next 3 weeks to discuss client management of anxiety and panic symptoms Client to attend scheduled medical appointments Client to communicate with RN CM as needed to discuss nursing needs of client. Client to socialize with family or friends as a way to manage anxiety or stress symptoms    .      Follow Up Plan: LCSW to call client in next 4 weeks to talk about client management of anxiety or panic symptoms  [name].Glennie Bose MSW, LCSW Licensed Clinical Social Worker [facility] [phone]

## 2020-02-24 ENCOUNTER — Ambulatory Visit: Payer: Medicare HMO | Admitting: Licensed Clinical Social Worker

## 2020-02-24 DIAGNOSIS — J439 Emphysema, unspecified: Secondary | ICD-10-CM

## 2020-02-24 DIAGNOSIS — I5032 Chronic diastolic (congestive) heart failure: Secondary | ICD-10-CM

## 2020-02-24 DIAGNOSIS — N1832 Chronic kidney disease, stage 3b: Secondary | ICD-10-CM

## 2020-02-24 DIAGNOSIS — I10 Essential (primary) hypertension: Secondary | ICD-10-CM

## 2020-02-24 DIAGNOSIS — E785 Hyperlipidemia, unspecified: Secondary | ICD-10-CM

## 2020-02-24 DIAGNOSIS — F411 Generalized anxiety disorder: Secondary | ICD-10-CM

## 2020-02-24 NOTE — Chronic Care Management (AMB) (Signed)
Chronic Care Management    Clinical Social Work Follow Up Note  02/24/2020 Name: Steven [name] MRN: [id number] DOB: 1928/11/26  Steven [name] is a 87 y.o. year old male who is a primary care patient of Steven [name], Steven [name]. The CCM team was consulted for assistance with [employer] .   Review of patient status, including review of consultants reports, other relevant assessments, and collaboration with appropriate care team members and the patient's provider was performed as part of comprehensive patient evaluation and provision of chronic care management services.    SDOH (Social Determinants of Health) assessments performed: No; risk for depression; risk for tobacco use; risk for stress; risk for physical inactivity  [facility] Office Visit from 02/11/2019 in [facility]  PHQ-9 Total Score 5     GAD 7 : Generalized Anxiety Score 12/11/2019 09/15/2019 06/11/2019 12/11/2018  Nervous, Anxious, on Edge 0 1 0 1  Control/stop worrying 0 1 1 1   Worry too much - different things 0 0 1 1  Trouble relaxing 0 0 0 0  Restless 0 1 0 1  Easily annoyed or irritable 0 0 0 0  Afraid - awful might happen 0 1 1 0  Total GAD 7 Score 0 4 3 4   Anxiety Difficulty Not difficult at all Not difficult at all Not difficult at all Not difficult at all    Outpatient Encounter Medications as of 02/24/2020  Medication Sig Note  . albuterol (PROVENTIL) (2.5 MG/3ML) 0.083% nebulizer solution Take 3 mLs (2.5 mg total) by nebulization every 2 (two) hours as needed for wheezing or shortness of breath. 04/17/2016: PRN Only  . aspirin 81 MG tablet Take 81 mg by mouth daily.  08/04/2015: Received from: External Pharmacy  . atorvastatin (LIPITOR) 10 MG tablet Take 1 tablet (10 mg total) by mouth daily.   . clonazePAM (KLONOPIN) 1 MG tablet Take 1 tablet (1 mg total) by mouth 2 (two) times daily.   [name] diltiazem (CARDIZEM CD) 120 MG 24 hr capsule Take 1 capsule (120 mg total) by mouth daily.    [name] doxazosin (CARDURA) 2 MG tablet Take 1 tablet (2 mg total) by mouth daily.   . furosemide (LASIX) 20 MG tablet Take 1 tablet (20 mg total) by mouth every other day.   . gabapentin (NEURONTIN) 100 MG capsule Take once or twice daily for pain, if too groggy then just take at bedtime   . ipratropium-albuterol (DUONEB) 0.5-2.5 (3) MG/3ML SOLN Take 3 mLs by nebulization 3 (three) times daily. 12/31/2017: Patient reports infrequent use; does not know when he received his nebulizer; no DME maintenance being performed  . iron polysaccharides (FERREX 150) 150 MG capsule Take 1 capsule (150 mg total) by mouth daily.    No facility-administered encounter medications on file as of 02/24/2020.    Goals    .  "I want to make sure I can keep control of my anxiety"  (pt-stated)       Barriers: Pain issues faced Mental Health issues in patient with Chronic Diagnoses of CHF, CKD, COPD,  HTN, Hyperlipidemia and Anxiety State  Clinical Social Work Clinical Goal(s): Over the next 30 days client and family caregiver will verbalize understanding of long term plan for management of anxiety with panic attacks.    Interventions:  Talked with Steven [name], son of client about client needs Talked with Steven [name] (son of client)about pain issues of client Talked with Steven [name] (son of client) about medication procurement of client Encouraged  client/Steven [name] to talk with RNCM regarding nursing needs of client Talked with Steven [name] (son of client) about vision of client Talked with son of client about meal provision of client Talked with son of client about transport needs of client Talked with Steven [name] (son of client) about mobility of client (client has pain occasionally in both legs) Talked with son of client about client completion of ADLs Talked with Steven [name] (son of client) about upcoming client appointments Talked with son of client about sleeping issues of client Talked with son of client about mood of client Talked with son of  client about socialization of client  Patient Self Care Activities:  . Participates in morning breakfast gathering with friends. Steven [name] with family. Sees son each Saturday.   .  Talks openly about his struggles with anxiety and medications taken for anxiety.   Plan:   Client to communicate with family members as needed to discuss ongoing client needs. Client to communicate with LCSW as needed to discuss psychosocial needs of client LCSW to call client in next 4 weeks to discuss client management of anxiety and panic symptoms Client to attend scheduled medical appointments Client to communicate with RN CM as needed to discuss nursing needs of client. Client to socialize with family or friends as a way to manage anxiety or stress symptoms     Follow Up Plan:LCSW to call client/son of client in next 4 weeks to talk about client management of anxiety or panic symptoms  Steven [name].Steven [name] MSW, LCSW Licensed Clinical Social Worker [facility] [phone]

## 2020-02-24 NOTE — Patient Instructions (Addendum)
Licensed Clinical Education officer, museum Visit Information  Goals we discussed today:   "I want to make sure I can keep control of my anxiety"  (pt-stated)           Barriers: Pain issues faced Mental Health issues in patient with Chronic Diagnoses of CHF, CKD, COPD,  HTN, Hyperlipidemia and Anxiety State  Clinical Social Work Clinical Goal(s): Over the next 30 days client and family caregiver will verbalize understanding of long term plan for management of anxiety with panic attacks.    Interventions:  Talked with Karenann Cai, son of client about client needs Talked with Dwyne (son of client)about pain issues of client Talked with Bryler (son of client) about medication procurement of client Encouraged client/Wyman to talk with RNCM regarding nursing needs of client Talked with Tayquan (son of client) about vision of client Talked with son of client about meal provision of client Talked with son of client about transport needs of client Talked with Jais (son of client) about mobility of client (client has pain occasionally in both legs) Talked with son of client about client completion of ADLs Talked with Dwyane (son of client) about upcoming client appointments Talked with son of client about sleeping issues of client Talked with son of client about mood of client Talked with son of client about socialization of client  Patient Self Care Activities:   Participates in morning breakfast gathering with friends.  Socializes with family. Sees son each Saturday.     Talks openly about his struggles with anxiety and medications taken for anxiety.   Plan:   Client to communicate with family members as needed to discuss ongoing client needs. Client to communicate with LCSW as needed to discuss psychosocial needs of client LCSW to call client in next 4 weeks to discuss client management of anxiety and panic symptoms Client to attend scheduled medical appointments Client to communicate with  RN CM as needed to discuss nursing needs of client. Client to socialize with family or friends as a way to manage anxiety or stress symptoms     Follow Up Plan:LCSW to call client/son of client in next 4 weeks to talk about client management of anxiety or panic symptoms  Materials Provided: No  The patient/ Steven [name] ,son of patient, verbalized understanding of instructions provided today and declined a print copy of patient instruction materials.   [name].[name] MSW, LCSW Licensed Clinical Social Worker [facility] [phone]

## 2020-03-21 ENCOUNTER — Other Ambulatory Visit: Payer: Self-pay

## 2020-03-21 ENCOUNTER — Encounter: Payer: Self-pay | Admitting: Nurse Practitioner

## 2020-03-21 ENCOUNTER — Ambulatory Visit (INDEPENDENT_AMBULATORY_CARE_PROVIDER_SITE_OTHER): Payer: Medicare HMO | Admitting: Nurse Practitioner

## 2020-03-21 VITALS — BP 136/72 | HR 71 | Temp 97.3°F | Resp 20 | Ht 67.0 in | Wt 126.0 lb

## 2020-03-21 DIAGNOSIS — C3490 Malignant neoplasm of unspecified part of unspecified bronchus or lung: Secondary | ICD-10-CM

## 2020-03-21 DIAGNOSIS — I10 Essential (primary) hypertension: Secondary | ICD-10-CM

## 2020-03-21 DIAGNOSIS — N1832 Chronic kidney disease, stage 3b: Secondary | ICD-10-CM

## 2020-03-21 DIAGNOSIS — J439 Emphysema, unspecified: Secondary | ICD-10-CM | POA: Diagnosis not present

## 2020-03-21 DIAGNOSIS — I5032 Chronic diastolic (congestive) heart failure: Secondary | ICD-10-CM

## 2020-03-21 DIAGNOSIS — D649 Anemia, unspecified: Secondary | ICD-10-CM

## 2020-03-21 DIAGNOSIS — G8929 Other chronic pain: Secondary | ICD-10-CM

## 2020-03-21 DIAGNOSIS — M545 Low back pain, unspecified: Secondary | ICD-10-CM

## 2020-03-21 DIAGNOSIS — I2510 Atherosclerotic heart disease of native coronary artery without angina pectoris: Secondary | ICD-10-CM | POA: Diagnosis not present

## 2020-03-21 DIAGNOSIS — F0390 Unspecified dementia without behavioral disturbance: Secondary | ICD-10-CM

## 2020-03-21 DIAGNOSIS — F411 Generalized anxiety disorder: Secondary | ICD-10-CM

## 2020-03-21 DIAGNOSIS — E785 Hyperlipidemia, unspecified: Secondary | ICD-10-CM | POA: Diagnosis not present

## 2020-03-21 DIAGNOSIS — N400 Enlarged prostate without lower urinary tract symptoms: Secondary | ICD-10-CM | POA: Diagnosis not present

## 2020-03-21 DIAGNOSIS — D508 Other iron deficiency anemias: Secondary | ICD-10-CM

## 2020-03-21 DIAGNOSIS — F05 Delirium due to known physiological condition: Secondary | ICD-10-CM

## 2020-03-21 MED ORDER — DILTIAZEM HCL ER COATED BEADS 120 MG PO CP24: 120.0000 mg | ORAL_CAPSULE | Freq: Every day | ORAL | 1 refills | Status: AC

## 2020-03-21 MED ORDER — ATORVASTATIN CALCIUM 10 MG PO TABS: 10.0000 mg | ORAL_TABLET | Freq: Every day | ORAL | 1 refills | Status: AC

## 2020-03-21 MED ORDER — FUROSEMIDE 20 MG PO TABS: 20.0000 mg | ORAL_TABLET | ORAL | 1 refills | Status: AC

## 2020-03-21 MED ORDER — DOXAZOSIN MESYLATE 2 MG PO TABS: 2.0000 mg | ORAL_TABLET | Freq: Every day | ORAL | 1 refills | Status: AC

## 2020-03-21 MED ORDER — CLONAZEPAM 1 MG PO TABS: 1.0000 mg | ORAL_TABLET | Freq: Two times a day (BID) | ORAL | 5 refills | Status: AC

## 2020-03-21 MED ORDER — POLYSACCHARIDE IRON COMPLEX 150 MG PO CAPS: 150.0000 mg | ORAL_CAPSULE | Freq: Every day | ORAL | 1 refills | Status: DC

## 2020-03-21 MED ORDER — GABAPENTIN 100 MG PO CAPS: ORAL_CAPSULE | ORAL | 1 refills | Status: DC

## 2020-03-21 NOTE — Patient Instructions (Signed)
Chronic Back Pain When back pain lasts longer than 3 months, it is called chronic back pain. Pain may get worse at certain times (flare-ups). There are things you can do at home to manage your pain. Follow these instructions at home: Pay attention to any changes in your symptoms. Take these actions to help with your pain: Managing pain and stiffness  If told, put ice on the painful area. Your doctor may tell you to use ice for 24-48 hours after the flare-up starts. To do this: ? Put ice in a plastic bag. ? Place a towel between your skin and the bag. ? Leave the ice on for 20 minutes, 2-3 times a day.  If told, put heat on the painful area. Do this as often as told by your doctor. Use the heat source that your doctor recommends, such as a moist heat pack or a heating pad. ? Place a towel between your skin and the heat source. ? Leave the heat on for 20-30 minutes. ? Take off the heat if your skin turns bright red. This is especially important if you are unable to feel pain, heat, or cold. You may have a greater risk of getting burned.  Soak in a warm bath. This can help relieve pain.      Activity  Avoid bending and other activities that make pain worse.  When standing: ? Keep your upper back and neck straight. ? Keep your shoulders pulled back. ? Avoid slouching.  When sitting: ? Keep your back straight. ? Relax your shoulders. Do not round your shoulders or pull them backward.  Do not sit or stand in one place for long periods of time.  Take short rest breaks during the day. Lying down or standing is usually better than sitting. Resting can help relieve pain.  When sitting or lying down for a long time, do some mild activity or stretching. This will help to prevent stiffness and pain.  Get regular exercise. Ask your doctor what activities are safe for you.  Do not lift anything that is heavier than 10 lb (4.5 kg) or the limit that you are told, until your doctor says that it  is safe.  To prevent injury when you lift things: ? Bend your knees. ? Keep the weight close to your body. ? Avoid twisting.  Sleep on a firm mattress. Try lying on your side with your knees slightly bent. If you lie on your back, put a pillow under your knees.   Medicines  Treatment may include medicines for pain and swelling taken by mouth or put on the skin, prescription pain medicine, or muscle relaxants.  Take over-the-counter and prescription medicines only as told by your doctor.  Ask your doctor if the medicine prescribed to you: ? Requires you to avoid driving or using machinery. ? Can cause trouble pooping (constipation). You may need to take these actions to prevent or treat trouble pooping:  Drink enough fluid to keep your pee (urine) pale yellow.  Take over-the-counter or prescription medicines.  Eat foods that are high in fiber. These include beans, whole grains, and fresh fruits and vegetables.  Limit foods that are high in fat and sugars. These include fried or sweet foods. General instructions  Do not use any products that contain nicotine or tobacco, such as cigarettes, e-cigarettes, and chewing tobacco. If you need help quitting, ask your doctor.  Keep all follow-up visits as told by your doctor. This is important. Contact a doctor if:    Your pain does not get better with rest or medicine.  Your pain gets worse, or you have new pain.  You have a high fever.  You lose weight very quickly.  You have trouble doing your normal activities. Get help right away if:  One or both of your legs or feet feel weak.  One or both of your legs or feet lose feeling (have numbness).  You have trouble controlling when you poop (have a bowel movement) or pee (urinate).  You have bad back pain and: ? You feel like you may vomit (nauseous), or you vomit. ? You have pain in your belly (abdomen). ? You have shortness of breath. ? You faint. Summary  When back pain  lasts longer than 3 months, it is called chronic back pain.  Pain may get worse at certain times (flare-ups).  Use ice and heat as told by your doctor. Your doctor may tell you to use ice after flare-ups. This information is not intended to replace advice given to you by your health care provider. Make sure you discuss any questions you have with your health care provider. Document Revised: 03/04/2019 Document Reviewed: 03/04/2019 Elsevier Patient Education  2021 Elsevier Inc.  

## 2020-03-21 NOTE — Progress Notes (Signed)
Subjective:    Patient ID: Steven [name], male    DOB: [birth year], 87 y.o.   MRN: [id number]   Chief Complaint: Medical Management of Chronic Issues    HPI:  1. Essential hypertension No c/o chest pain,  or headache. Does not check blood pressure at home. BP Readings from Last 3 Encounters:  03/21/20 (!) 157/69  12/19/19 124/79  12/17/19 (!) 139/55     2. Hyperlipidemia with target LDL less than 100 Doe snot watch diet. He eats whatever people bring him or whatever he can find. He does no exercise. Lab Results  Component Value Date   CHOL 110 09/15/2019   HDL 49 09/15/2019   LDLCALC 39 09/15/2019   TRIG 126 09/15/2019   CHOLHDL 2.2 09/15/2019    3. Atherosclerosis of native coronary artery of native heart without angina pectoris Has not seen cardiology  In awhile. He has a hard time getting transportation. He says he feels fine and really does not feel that he needs to go.  4. Chronic diastolic congestive heart failure ([place]) denies any edema. Has occasional SOB if he is to active.  5. Pulmonary emphysema, unspecified emphysema type ([place]) He is on albuterol and duoneb. Says he is about the same.  6. Squamous cell carcinoma of lung, unspecified laterality ([place]) Has not had a chest CT since 2019. Was negative for CA.  7. Dementia without behavioral disturbance, unspecified dementia type ([place]) Has lots of memory problems according to family.  8. Benign prostatic hyperplasia without lower urinary tract symptoms No problems voiding Lab Results  Component Value Date   PSA1 4.4 (H) 05/01/2017   PSA1 3.6 01/20/2015   PSA 3.1 05/29/2013   PSA 3.00 08/22/2012      9. Stage 3b chronic kidney disease ([place]) Lab Results  Component Value Date   CREATININE 1.59 (H) 12/19/2019     10. Anxiety state Stays anxious. Is on klonopin BID which helps.  GAD 7 : Generalized Anxiety Score 03/21/2020 12/11/2019 09/15/2019 06/11/2019  Nervous, Anxious, on Edge 0 0 1 0   Control/stop worrying 0 0 1 1  Worry too much - different things 0 0 0 1  Trouble relaxing 0 0 0 0  Restless 0 0 1 0  Easily annoyed or irritable 0 0 0 0  Afraid - awful might happen 0 0 1 1  Total GAD 7 Score 0 0 4 3  Anxiety Difficulty Not difficult at all Not difficult at all Not difficult at all Not difficult at all       11. Chronic anemia Denies any fatigue Lab Results  Component Value Date   HGB 10.7 (L) 12/19/2019     12. Sundowning Gets very confused in the evenings. He kids try  To check on him daily.    Outpatient Encounter Medications as of 03/21/2020  Medication Sig  . albuterol (PROVENTIL) (2.5 MG/3ML) 0.083% nebulizer solution Take 3 mLs (2.5 mg total) by nebulization every 2 (two) hours as needed for wheezing or shortness of breath.  [name] aspirin 81 MG tablet Take 81 mg by mouth daily.   [name] atorvastatin (LIPITOR) 10 MG tablet Take 1 tablet (10 mg total) by mouth daily.  . clonazePAM (KLONOPIN) 1 MG tablet Take 1 tablet (1 mg total) by mouth 2 (two) times daily.  [name] diltiazem (CARDIZEM CD) 120 MG 24 hr capsule Take 1 capsule (120 mg total) by mouth daily.  [name] doxazosin (CARDURA) 2 MG tablet Take 1 tablet (2 mg total) by  mouth daily.  . furosemide (LASIX) 20 MG tablet Take 1 tablet (20 mg total) by mouth every other day.  . gabapentin (NEURONTIN) 100 MG capsule Take once or twice daily for pain, if too groggy then just take at bedtime  . ipratropium-albuterol (DUONEB) 0.5-2.5 (3) MG/3ML SOLN Take 3 mLs by nebulization 3 (three) times daily.  . iron polysaccharides (FERREX 150) 150 MG capsule Take 1 capsule (150 mg total) by mouth daily.     Past Surgical History:  Procedure Laterality Date  . CARPAL TUNNEL RELEASE  1980's   right and left  . CHOLECYSTECTOMY    . COLONOSCOPY N/A 09/29/2014   Procedure: COLONOSCOPY;  Surgeon: Rogene Houston, MD;  Location[facility];  Service: Endoscopy;  Laterality: N/A;  . CORONARY ARTERY BYPASS GRAFT  1999   5 grafts  .  FLEXIBLE BRONCHOSCOPY  11/07/2011   Procedure: FLEXIBLE BRONCHOSCOPY;  Surgeon: Gaye Pollack, MD;  Location: [facility];  Service: Thoracic;  Laterality: N/A;  . HERNIA REPAIR  9470   umbilical hernia repair  . JOINT REPLACEMENT  2000   right shoulder rotator cuff repair    Family History  Problem Relation Age of Onset  . Cancer Mother        pancreatic  . Pulmonary embolism Father   . Anxiety disorder Other     New complaints: None today  Social history: Lives by hisself  Controlled substance contract: 05/27/19    Review of Systems  Constitutional: Negative for diaphoresis.  Eyes: Negative for pain.  Respiratory: Negative for shortness of breath.   Cardiovascular: Negative for chest pain, palpitations and leg swelling.  Gastrointestinal: Negative for abdominal pain.  Endocrine: Negative for polydipsia.  Skin: Negative for rash.  Neurological: Negative for dizziness, weakness and headaches.  Hematological: Does not bruise/bleed easily.  All other systems reviewed and are negative.      Objective:   Physical Exam Vitals and nursing note reviewed.  Constitutional:      Appearance: Normal appearance. He is well-developed and well-nourished.  HENT:     Head: Normocephalic.     Nose: Nose normal.     Mouth/Throat:     Mouth: Oropharynx is clear and moist.  Eyes:     Extraocular Movements: EOM normal.     Pupils: Pupils are equal, round, and reactive to light.  Neck:     Thyroid: No thyroid mass or thyromegaly.     Vascular: No carotid bruit or JVD.     Trachea: Phonation normal.  Cardiovascular:     Rate and Rhythm: Normal rate and regular rhythm.  Pulmonary:     Effort: Pulmonary effort is normal. No respiratory distress.     Breath sounds: Normal breath sounds.  Abdominal:     General: Bowel sounds are normal. Aorta is normal.     Palpations: Abdomen is soft.     Tenderness: There is no abdominal tenderness.  Musculoskeletal:        General: Normal range of  motion.     Cervical back: Normal range of motion and neck supple.  Lymphadenopathy:     Cervical: No cervical adenopathy.  Skin:    General: Skin is warm and dry.  Neurological:     Mental Status: He is alert and oriented to person, place, and time.  Psychiatric:        Mood and Affect: Mood and affect normal.        Behavior: Behavior normal.  Thought Content: Thought content normal.        Judgment: Judgment normal.     BP 136/72   Pulse 71   Temp (!) 97.3 F (36.3 C) (Temporal)   Resp 20   Ht 5\' 7"  (1.702 m)   Wt 126 lb (57.2 kg)   SpO2 98%   BMI 19.73 kg/m         Assessment & Plan:  ISTVAN BEHAR comes in today with chief complaint of Medical Management of Chronic Issues   Diagnosis and orders addressed:  1. Essential hypertension Low sodium diet - furosemide (LASIX) 20 MG tablet; Take 1 tablet (20 mg total) by mouth every other day.  Dispense: 90 tablet; Refill: 1  2. Hyperlipidemia with target LDL less than 100 Low fat diet - atorvastatin (LIPITOR) 10 MG tablet; Take 1 tablet (10 mg total) by mouth daily.  Dispense: 90 tablet; Refill: 1  3. Atherosclerosis of native coronary artery of native heart without angina pectoris Report any chest pain  4. Chronic diastolic congestive heart failure ([facility]) - diltiazem (CARDIZEM CD) 120 MG 24 hr capsule; Take 1 capsule (120 mg total) by mouth daily.  Dispense: 90 capsule; Refill: 1  5. Pulmonary emphysema, unspecified emphysema type ([place])  6. Squamous cell carcinoma of lung, unspecified laterality ([place])  7. Dementia without behavioral disturbance, unspecified dementia type ([place])  8. Benign prostatic hyperplasia without lower urinary tract symptoms Report any problems voiding - doxazosin (CARDURA) 2 MG tablet; Take 1 tablet (2 mg total) by mouth daily.  Dispense: 90 tablet; Refill: 1  9. Stage 3b chronic kidney disease ([place]) Labs pending  10. Anxiety state continue klonopin - clonazePAM (KLONOPIN) 1  MG tablet; Take 1 tablet (1 mg total) by mouth 2 (two) times daily.  Dispense: 60 tablet; Refill: 5  11. Chronic anemia Continue iron supplemenmt  12. Sundowning  13. Iron deficiency anemia secondary to inadequate dietary iron intake - iron polysaccharides (FERREX 150) 150 MG capsule; Take 1 capsule (150 mg total) by mouth daily.  Dispense: 90 capsule; Refill: 1  14. Chronic midline low back pain without sciatica Moist heat Rest No bending or stooping - gabapentin (NEURONTIN) 100 MG capsule; Take once or twice daily for pain, if too groggy then just take at bedtime  Dispense: 180 capsule; Refill: 1   Labs pending Health Maintenance reviewed Diet and exercise encouraged  Follow up plan: 6 months   Mary-Margaret Hassell Done, [name]

## 2020-03-22 LAB — CMP14+EGFR
ALT: 11 IU/L (ref 0–44)
AST: 17 IU/L (ref 0–40)
Albumin/Globulin Ratio: 1.3 (ref 1.2–2.2)
Albumin: 4 g/dL (ref 3.6–4.6)
Alkaline Phosphatase: 82 IU/L (ref 44–121)
BUN/Creatinine Ratio: 18 (ref 10–24)
BUN: 27 mg/dL (ref 8–27)
Bilirubin Total: 0.3 mg/dL (ref 0.0–1.2)
CO2: 26 mmol/L (ref 20–29)
Calcium: 9.5 mg/dL (ref 8.6–10.2)
Chloride: 97 mmol/L (ref 96–106)
Creatinine, Ser: 1.54 mg/dL — ABNORMAL HIGH (ref 0.76–1.27)
GFR calc Af Amer: 46 mL/min/{1.73_m2} — ABNORMAL LOW (ref >59)
GFR calc non Af Amer: 39 mL/min/{1.73_m2} — ABNORMAL LOW (ref >59)
Globulin, Total: 3.1 g/dL (ref 1.5–4.5)
Glucose: 88 mg/dL (ref 65–99)
Potassium: 4.8 mmol/L (ref 3.5–5.2)
Sodium: 136 mmol/L (ref 134–144)
Total Protein: 7.1 g/dL (ref 6.0–8.5)

## 2020-03-22 LAB — CBC WITH DIFFERENTIAL/PLATELET
Basophils Absolute: 0.1 10*3/uL (ref 0.0–0.2)
Basos: 1 %
EOS (ABSOLUTE): 0.3 10*3/uL (ref 0.0–0.4)
Eos: 4 %
Hematocrit: 31.2 % — ABNORMAL LOW (ref 37.5–51.0)
Hemoglobin: 10.5 g/dL — ABNORMAL LOW (ref 13.0–17.7)
Immature Grans (Abs): 0 10*3/uL (ref 0.0–0.1)
Immature Granulocytes: 0 %
Lymphocytes Absolute: 1.8 10*3/uL (ref 0.7–3.1)
Lymphs: 31 %
MCH: 31.2 pg (ref 26.6–33.0)
MCHC: 33.7 g/dL (ref 31.5–35.7)
MCV: 93 fL (ref 79–97)
Monocytes Absolute: 0.5 10*3/uL (ref 0.1–0.9)
Monocytes: 8 %
Neutrophils Absolute: 3.2 10*3/uL (ref 1.4–7.0)
Neutrophils: 56 %
Platelets: 154 10*3/uL (ref 150–450)
RBC: 3.37 x10E6/uL — ABNORMAL LOW (ref 4.14–5.80)
RDW: 12.4 % (ref 11.6–15.4)
WBC: 5.8 10*3/uL (ref 3.4–10.8)

## 2020-03-22 LAB — LIPID PANEL
Chol/HDL Ratio: 2.1 ratio (ref 0.0–5.0)
Cholesterol, Total: 106 mg/dL (ref 100–199)
HDL: 51 mg/dL (ref >39)
LDL Chol Calc (NIH): 40 mg/dL (ref 0–99)
Triglycerides: 70 mg/dL (ref 0–149)
VLDL Cholesterol Cal: 15 mg/dL (ref 5–40)

## 2020-03-29 ENCOUNTER — Telehealth: Payer: Medicare HMO

## 2020-04-27 ENCOUNTER — Telehealth: Payer: Medicare HMO

## 2020-05-13 ENCOUNTER — Ambulatory Visit (INDEPENDENT_AMBULATORY_CARE_PROVIDER_SITE_OTHER): Payer: Medicare HMO | Admitting: Nurse Practitioner

## 2020-05-13 ENCOUNTER — Encounter: Payer: Self-pay | Admitting: Nurse Practitioner

## 2020-05-13 ENCOUNTER — Other Ambulatory Visit: Payer: Self-pay

## 2020-05-13 ENCOUNTER — Telehealth: Payer: Self-pay

## 2020-05-13 VITALS — BP 87/52 | HR 92 | Temp 97.1°F | Resp 20 | Ht 67.0 in | Wt 114.0 lb

## 2020-05-13 DIAGNOSIS — R531 Weakness: Secondary | ICD-10-CM | POA: Diagnosis not present

## 2020-05-13 MED ORDER — GABAPENTIN 100 MG PO CAPS: 100.0000 mg | ORAL_CAPSULE | Freq: Three times a day (TID) | ORAL | 1 refills | Status: DC

## 2020-05-13 NOTE — Telephone Encounter (Signed)
Contacted son and he was concerned about O2 level being 91%. Advised son that patient really struggled walking from the waiting room to the exam room. I advised that his O2 was considered ok but he was probably on the lower side due to all that effort and walking. Son verbalized understanding. Advised to contact office with any further questions or concerns

## 2020-05-13 NOTE — Progress Notes (Signed)
   Subjective:    Patient ID: Steven [name], male    DOB: 1928/11/21, 87 y.o.   MRN: [id number]   Chief Complaint: leg pain   HPI Patient comes in today c/o bil leg pain and overall just not feeling well. Nothing has changed in his plan of care over the last few months and he is on no new medications. He lives by hisself and does not get out much . He does little to no exercise. His labs were last done on 03/21/20 and were stable for him. His hgb was slightly low but unchanged from 3 previous lab results. He is on neurontin for back pain and he takes it 2x a day. His son is with him today and he says he has really gotten weak in the last 2 months. He thinks the gabapentin is causing him to be weak. Is using a walker to get around.   Review of Systems  Constitutional: Positive for activity change and fatigue.  Respiratory: Negative.   Cardiovascular: Negative.  Negative for leg swelling.  Gastrointestinal: Negative.   Psychiatric/Behavioral: Negative.   All other systems reviewed and are negative.      Objective:   Physical Exam Vitals and nursing note reviewed.  Constitutional:      Appearance: Normal appearance.  Cardiovascular:     Rate and Rhythm: Normal rate and regular rhythm.     Heart sounds: Normal heart sounds.  Pulmonary:     Effort: Pulmonary effort is normal.     Breath sounds: Normal breath sounds.  Musculoskeletal:     Comments: Gait slow and steady. Walking with walker.  Skin:    General: Skin is warm and dry.  Neurological:     General: No focal deficit present.     Mental Status: He is alert and oriented to person, place, and time.  Psychiatric:        Mood and Affect: Mood normal.        Behavior: Behavior normal.    BP (!) 87/52   Pulse 92   Temp (!) 97.1 F (36.2 C) (Temporal)   Resp 20   Ht 5\' 7"  (1.702 m)   Wt 114 lb (51.7 kg)   SpO2 91%   BMI 17.85 kg/m         Assessment & Plan:  Steven [name] in today with chief complaint of Leg  Pain   1. Weakness Daily boost Referral to pallaitve care Stop neurontin Extra strength tylenol 2x a day Follow up in 1 month   - Amb Referral to Palliative Care    The above assessment and management plan was discussed with the patient. The patient verbalized understanding of and has agreed to the management plan. Patient is aware to call the clinic if symptoms persist or worsen. Patient is aware when to return to the clinic for a follow-up visit. Patient educated on when it is appropriate to go to the emergency department.   Mary-Margaret Hassell Done, [name]

## 2020-05-13 NOTE — Patient Instructions (Signed)
Fall Prevention in the Home, Adult Falls can cause injuries and can happen to people of all ages. There are many things you can do to make your home safe and to help prevent falls. Ask for help when making these changes. What actions can I take to prevent falls? General Instructions  Use good lighting in all rooms. Replace any light bulbs that burn out.  Turn on the lights in dark areas. Use night-lights.  Keep items that you use often in easy-to-reach places. Lower the shelves around your home if needed.  Set up your furniture so you have a clear path. Avoid moving your furniture around.  Do not have throw rugs or other things on the floor that can make you trip.  Avoid walking on wet floors.  If any of your floors are uneven, fix them.  Add color or contrast paint or tape to clearly mark and help you see: ? Grab bars or handrails. ? First and last steps of staircases. ? Where the edge of each step is.  If you use a stepladder: ? Make sure that it is fully opened. Do not climb a closed stepladder. ? Make sure the sides of the stepladder are locked in place. ? Ask someone to hold the stepladder while you use it.  Know where your pets are when moving through your home. What can I do in the bathroom?  Keep the floor dry. Clean up any water on the floor right away.  Remove soap buildup in the tub or shower.  Use nonskid mats or decals on the floor of the tub or shower.  Attach bath mats securely with double-sided, nonslip rug tape.  If you need to sit down in the shower, use a plastic, nonslip stool.  Install grab bars by the toilet and in the tub and shower. Do not use towel bars as grab bars.      What can I do in the bedroom?  Make sure that you have a light by your bed that is easy to reach.  Do not use any sheets or blankets for your bed that hang to the floor.  Have a firm chair with side arms that you can use for support when you get dressed. What can I do in  the kitchen?  Clean up any spills right away.  If you need to reach something above you, use a step stool with a grab bar.  Keep electrical cords out of the way.  Do not use floor polish or wax that makes floors slippery. What can I do with my stairs?  Do not leave any items on the stairs.  Make sure that you have a light switch at the top and the bottom of the stairs.  Make sure that there are handrails on both sides of the stairs. Fix handrails that are broken or loose.  Install nonslip stair treads on all your stairs.  Avoid having throw rugs at the top or bottom of the stairs.  Choose a carpet that does not hide the edge of the steps on the stairs.  Check carpeting to make sure that it is firmly attached to the stairs. Fix carpet that is loose or worn. What can I do on the outside of my home?  Use bright outdoor lighting.  Fix the edges of walkways and driveways and fix any cracks.  Remove anything that might make you trip as you walk through a door, such as a raised step or threshold.  Trim any   bushes or trees on paths to your home.  Check to see if handrails are loose or broken and that both sides of all steps have handrails.  Install guardrails along the edges of any raised decks and porches.  Clear paths of anything that can make you trip, such as tools or rocks.  Have leaves, snow, or ice cleared regularly.  Use sand or salt on paths during winter.  Clean up any spills in your garage right away. This includes grease or oil spills. What other actions can I take?  Wear shoes that: ? Have a low heel. Do not wear high heels. ? Have rubber bottoms. ? Feel good on your feet and fit well. ? Are closed at the toe. Do not wear open-toe sandals.  Use tools that help you move around if needed. These include: ? Canes. ? Walkers. ? Scooters. ? Crutches.  Review your medicines with your doctor. Some medicines can make you feel dizzy. This can increase your chance  of falling. Ask your doctor what else you can do to help prevent falls. Where to find more information  Centers for Disease Control and Prevention, [employer]: www.cdc.gov  National Institute on Aging: www.nia.nih.gov Contact a doctor if:  You are afraid of falling at home.  You feel weak, drowsy, or dizzy at home.  You fall at home. Summary  There are many simple things that you can do to make your home safe and to help prevent falls.  Ways to make your home safe include removing things that can make you trip and installing grab bars in the bathroom.  Ask for help when making these changes in your home. This information is not intended to replace advice given to you by your health care provider. Make sure you discuss any questions you have with your health care provider. Document Revised: 08/26/2019 Document Reviewed: 08/26/2019 Elsevier Patient Education  2021 Elsevier Inc.  

## 2020-05-26 ENCOUNTER — Telehealth: Payer: Self-pay | Admitting: Nurse Practitioner

## 2020-05-27 NOTE — Telephone Encounter (Signed)
NA/NVM - pt will have to start with applying for MCD, then he can call Loma Sousa with the [place] program at [phone]

## 2020-05-27 NOTE — Telephone Encounter (Signed)
Son has already talked with Baldo Ash with CAP program and ppw is in process, waiting on call back from [place] about the ppw we are needing to fill out

## 2020-05-27 NOTE — Telephone Encounter (Signed)
Son received new ppw today in mail, he will bring it to the office

## 2020-06-03 ENCOUNTER — Ambulatory Visit (INDEPENDENT_AMBULATORY_CARE_PROVIDER_SITE_OTHER): Payer: Medicare HMO | Admitting: Licensed Clinical Social Worker

## 2020-06-03 DIAGNOSIS — F0390 Unspecified dementia without behavioral disturbance: Secondary | ICD-10-CM

## 2020-06-03 DIAGNOSIS — E785 Hyperlipidemia, unspecified: Secondary | ICD-10-CM

## 2020-06-03 DIAGNOSIS — F411 Generalized anxiety disorder: Secondary | ICD-10-CM

## 2020-06-03 DIAGNOSIS — I5032 Chronic diastolic (congestive) heart failure: Secondary | ICD-10-CM

## 2020-06-03 DIAGNOSIS — J439 Emphysema, unspecified: Secondary | ICD-10-CM

## 2020-06-03 DIAGNOSIS — I10 Essential (primary) hypertension: Secondary | ICD-10-CM | POA: Diagnosis not present

## 2020-06-03 DIAGNOSIS — N1832 Chronic kidney disease, stage 3b: Secondary | ICD-10-CM

## 2020-06-03 NOTE — Chronic Care Management (AMB) (Signed)
Chronic Care Management    Clinical Social Work Note  06/03/2020 Name: Steven [name] MRN: [id number] DOB: 1928-04-29  Steven [name] is a 87 y.o. year old male who is a primary care patient of [name], [place]. The CCM team was consulted to assist the patient with chronic disease management and/or care coordination needs related to: [employer] .   Engaged with patient/son, Steven [name] by telephone for follow up visit in response to provider referral for social work chronic care management and care coordination services.   Consent to Services:  The patient was given information about Chronic Care Management services, agreed to services, and gave verbal consent prior to initiation of services.  Please see initial visit note for detailed documentation.   Patient agreed to services and consent obtained.   Assessment: Review of patient past medical history, allergies, medications, and health status, including review of relevant consultants reports was performed today as part of a comprehensive evaluation and provision of chronic care management and care coordination services.     SDOH (Social Determinants of Health) assessments and interventions performed:  SDOH Interventions   Flowsheet Row Most Recent Value  SDOH Interventions   Depression Interventions/Treatment  Medication       Advanced Directives Status: See Vynca application for related entries.  CCM Care Plan  No Known Allergies  Outpatient Encounter Medications as of 06/03/2020  Medication Sig Note  . albuterol (PROVENTIL) (2.5 MG/3ML) 0.083% nebulizer solution Take 3 mLs (2.5 mg total) by nebulization every 2 (two) hours as needed for wheezing or shortness of breath. 04/17/2016: PRN Only  . aspirin 81 MG tablet Take 81 mg by mouth daily.  08/04/2015: Received from: External Pharmacy  . atorvastatin (LIPITOR) 10 MG tablet Take 1 tablet (10 mg total) by mouth daily.   . clonazePAM (KLONOPIN) 1 MG tablet Take 1  tablet (1 mg total) by mouth 2 (two) times daily.   [name] diltiazem (CARDIZEM CD) 120 MG 24 hr capsule Take 1 capsule (120 mg total) by mouth daily.   [name] doxazosin (CARDURA) 2 MG tablet Take 1 tablet (2 mg total) by mouth daily.   . furosemide (LASIX) 20 MG tablet Take 1 tablet (20 mg total) by mouth every other day.   . ipratropium-albuterol (DUONEB) 0.5-2.5 (3) MG/3ML SOLN Take 3 mLs by nebulization 3 (three) times daily. 12/31/2017: Patient reports infrequent use; does not know when he received his nebulizer; no DME maintenance being performed  . iron polysaccharides (FERREX 150) 150 MG capsule Take 1 capsule (150 mg total) by mouth daily.    No facility-administered encounter medications on file as of 06/03/2020.    Patient Active Problem List   Diagnosis Date Noted  . CHF (congestive heart failure) ([place]) 08/27/2017  . Dementia ([place]) 08/27/2017  . Chronic anemia 08/27/2017  . Sundowning 08/09/2017  . Goals of care, counseling/discussion   . Palliative care by specialist   . DNR (do not resuscitate) discussion   . Abdominal bruit 10/15/2014  . CKD (chronic kidney disease), stage III ([place]) 09/28/2014  . BPH (benign prostatic hyperplasia) 02/25/2013  . Squamous cell carcinoma of lung ([place]) 11/13/2011  . COPD with emphysema ([place]) 07/29/2007  . Hyperlipidemia with target LDL less than 100 07/28/2007  . Anxiety state 07/28/2007  . ERECTILE DYSFUNCTION 07/28/2007  . Essential hypertension 07/28/2007  . Coronary atherosclerosis 07/28/2007    Conditions to be addressed/monitored: Monitor client management of anxiety symptoms   Care Plan : LCSW care plan  Updates made  by Katha Cabal, LCSW since 06/03/2020 12:00 AM    Problem: Emotional Distress     Goal: Emotional Health Supported;Manage Anxiety issues faced   Start Date: 06/03/2020  Expected End Date: 09/02/2020  This Visit's Progress: On track  Priority: Medium  Note:   Current Barriers:  . Chronic Mental Health needs related  to anxiety and anxiety management . Some pain issues; some mobility issues . Suicidal Ideation/Homicidal Ideation: No  Clinical Social Work Goal(s):  . patient will work with SW monthly by telephone or in person to reduce or manage symptoms related to anxiety issues faced . patient will work with SW monthly to address concerns related to pain issues faced and mobility issues faced  Interventions: . 1:1 collaboration with [name], [name] regarding development and update of comprehensive plan of care as evidenced by provider attestation and co-signature . Talked with client about pain issues of client . Talked with [name], son of client, about CAP program application for client . Talked with son of client, about ambulation of client (uses a cane or walker as needed) . Talked with son of client about sleeping issues of client . Talked with son of client about client use of supplements . Talked with son of client about transport needs of client  Patient Self Care Activities:  . Self administers medications as prescribed . Attends all scheduled provider appointments . Performs ADL's independently  Patient Coping Strengths:  . Family . Friends  Patient Self Care Deficits:  . Anxiety issues . Pain issues; mobility issues  Patient Goals:  - spend time or talk with others at least 2 to 3 times per week - practice relaxation or meditation daily - keep a calendar with appointment dates  Follow Up Plan: LCSW to call client on 07/12/20     [name].Tammera Engert MSW, LCSW Licensed Clinical Social Worker [facility] Care Management [phone]

## 2020-06-03 NOTE — Patient Instructions (Signed)
Visit Information  PATIENT GOALS: Goals Addressed            This Visit's Progress   . Protect My Health;Manage Anxiety symptoms       Timeframe:  Short-Term Goal Priority:  Medium Progress: On Track Start Date:               06/03/20              Expected End Date:             09/02/20          Follow Up Date 07/12/20   Protect My Health (Patient)   Manage anxiety issues faced    Why is this important?    Screening tests can find diseases early when they are easier to treat.   Your doctor or nurse will talk with you about which tests are important for you.   Getting shots for common diseases like the flu and shingles will help prevent them.     Patient Self Care Activities:  . Self administers medications as prescribed . Attends all scheduled provider appointments . Performs ADL's independently  Patient Coping Strengths:  . Family . Friends  Patient Self Care Deficits:  . Anxiety issues . Pain issues; mobility issues  Patient Goals:  - spend time or talk with others at least 2 to 3 times per week - practice relaxation or meditation daily - keep a calendar with appointment dates  Follow Up Plan: LCSW to call client on 07/12/20       [name].Kately Graffam MSW, LCSW Licensed Clinical Social Worker [facility] Care Management [phone]

## 2020-06-21 DIAGNOSIS — E785 Hyperlipidemia, unspecified: Secondary | ICD-10-CM | POA: Diagnosis not present

## 2020-06-21 DIAGNOSIS — Z28311 Partially vaccinated for covid-19: Secondary | ICD-10-CM | POA: Diagnosis not present

## 2020-06-21 DIAGNOSIS — I10 Essential (primary) hypertension: Secondary | ICD-10-CM | POA: Diagnosis not present

## 2020-06-21 DIAGNOSIS — F419 Anxiety disorder, unspecified: Secondary | ICD-10-CM | POA: Diagnosis not present

## 2020-07-06 DIAGNOSIS — H6123 Impacted cerumen, bilateral: Secondary | ICD-10-CM | POA: Diagnosis not present

## 2020-07-12 ENCOUNTER — Ambulatory Visit (INDEPENDENT_AMBULATORY_CARE_PROVIDER_SITE_OTHER): Payer: Medicare HMO | Admitting: Licensed Clinical Social Worker

## 2020-07-12 DIAGNOSIS — I5032 Chronic diastolic (congestive) heart failure: Secondary | ICD-10-CM

## 2020-07-12 DIAGNOSIS — I10 Essential (primary) hypertension: Secondary | ICD-10-CM | POA: Diagnosis not present

## 2020-07-12 DIAGNOSIS — E785 Hyperlipidemia, unspecified: Secondary | ICD-10-CM | POA: Diagnosis not present

## 2020-07-12 DIAGNOSIS — N1832 Chronic kidney disease, stage 3b: Secondary | ICD-10-CM

## 2020-07-12 DIAGNOSIS — J439 Emphysema, unspecified: Secondary | ICD-10-CM | POA: Diagnosis not present

## 2020-07-12 DIAGNOSIS — F0390 Unspecified dementia without behavioral disturbance: Secondary | ICD-10-CM

## 2020-07-12 DIAGNOSIS — F411 Generalized anxiety disorder: Secondary | ICD-10-CM

## 2020-07-12 NOTE — Patient Instructions (Signed)
Visit Information  PATIENT GOALS: Goals Addressed            This Visit's Progress   . Protect My Health;Manage Anxiety symptoms       Timeframe:  Short-Term Goal Priority:  Medium Progress: On Track Start Date:              07/12/20            Expected End Date:            10/12/20          Follow Up Date 08/25/20   Protect My Health (Patient)   Manage anxiety issues faced    Why is this important?    Screening tests can find diseases early when they are easier to treat.   Your doctor or nurse will talk with you about which tests are important for you.   Getting shots for common diseases like the flu and shingles will help prevent them.     Patient Self Care Activities:  . Self administers medications as prescribed . Attends all scheduled provider appointments . Performs ADL's independently  Patient Coping Strengths:  . Family . Friends  Patient Self Care Deficits:  . Anxiety issues . Pain issues; mobility issues  Patient Goals:  - spend time or talk with others at least 2 to 3 times per week - practice relaxation or meditation daily - keep a calendar with appointment dates  Follow Up Plan: LCSW to call client on 08/25/20       Steven [name] MSW, LCSW Licensed Clinical Social Worker [facility] Care Management [phone]

## 2020-07-12 NOTE — Chronic Care Management (AMB) (Signed)
Chronic Care Management    Clinical Social Work Note  07/12/2020 Name: Steven [name] MRN: [id number] DOB: 1928-06-30  Steven [name] is a 87 y.o. year old male who is a primary care patient of [name], [place]. The CCM team was consulted to assist the patient with chronic disease management and/or care coordination needs related to: [employer] .   Engaged with patient by telephone for follow up visit in response to provider referral for social work chronic care management and care coordination services.   Consent to Services:  The patient was given information about Chronic Care Management services, agreed to services, and gave verbal consent prior to initiation of services.  Please see initial visit note for detailed documentation.   Patient agreed to services and consent obtained.   Assessment: Review of patient past medical history, allergies, medications, and health status, including review of relevant consultants reports was performed today as part of a comprehensive evaluation and provision of chronic care management and care coordination services.     SDOH (Social Determinants of Health) assessments and interventions performed:  SDOH Interventions   Flowsheet Row Most Recent Value  SDOH Interventions   Depression Interventions/Treatment  Medication       Advanced Directives Status: See Vynca application for related entries.  CCM Care Plan  No Known Allergies  Outpatient Encounter Medications as of 07/12/2020  Medication Sig Note  . albuterol (PROVENTIL) (2.5 MG/3ML) 0.083% nebulizer solution Take 3 mLs (2.5 mg total) by nebulization every 2 (two) hours as needed for wheezing or shortness of breath. 04/17/2016: PRN Only  . aspirin 81 MG tablet Take 81 mg by mouth daily.  08/04/2015: Received from: External Pharmacy  . atorvastatin (LIPITOR) 10 MG tablet Take 1 tablet (10 mg total) by mouth daily.   . clonazePAM (KLONOPIN) 1 MG tablet Take 1 tablet (1 mg total)  by mouth 2 (two) times daily.   [name] diltiazem (CARDIZEM CD) 120 MG 24 hr capsule Take 1 capsule (120 mg total) by mouth daily.   [name] doxazosin (CARDURA) 2 MG tablet Take 1 tablet (2 mg total) by mouth daily.   . furosemide (LASIX) 20 MG tablet Take 1 tablet (20 mg total) by mouth every other day.   . ipratropium-albuterol (DUONEB) 0.5-2.5 (3) MG/3ML SOLN Take 3 mLs by nebulization 3 (three) times daily. 12/31/2017: Patient reports infrequent use; does not know when he received his nebulizer; no DME maintenance being performed  . iron polysaccharides (FERREX 150) 150 MG capsule Take 1 capsule (150 mg total) by mouth daily.    No facility-administered encounter medications on file as of 07/12/2020.    Patient Active Problem List   Diagnosis Date Noted  . CHF (congestive heart failure) ([place]) 08/27/2017  . Dementia ([place]) 08/27/2017  . Chronic anemia 08/27/2017  . Sundowning 08/09/2017  . Goals of care, counseling/discussion   . Palliative care by specialist   . DNR (do not resuscitate) discussion   . Abdominal bruit 10/15/2014  . CKD (chronic kidney disease), stage III ([place]) 09/28/2014  . BPH (benign prostatic hyperplasia) 02/25/2013  . Squamous cell carcinoma of lung ([place]) 11/13/2011  . COPD with emphysema ([place]) 07/29/2007  . Hyperlipidemia with target LDL less than 100 07/28/2007  . Anxiety state 07/28/2007  . ERECTILE DYSFUNCTION 07/28/2007  . Essential hypertension 07/28/2007  . Coronary atherosclerosis 07/28/2007    Conditions to be addressed/monitored: Monitor client management of depression issues; monitor client completion of daily ADLs   Care Plan : LCSW care plan  Updates made by Katha Cabal, LCSW since 07/12/2020 12:00 AM    Problem: Emotional Distress     Goal: Emotional Health Supported;Manage Anxiety issues faced   Start Date: 07/12/2020  Expected End Date: 10/12/2020  This Visit's Progress: On track  Recent Progress: On track  Priority: Medium  Note:   Current  Barriers:  . Chronic Mental Health needs related to anxiety and anxiety management . Some pain issues; some mobility issues . Suicidal Ideation/Homicidal Ideation: No  Clinical Social Work Goal(s):  . patient will work with SW monthly by telephone or in person to reduce or manage symptoms related to anxiety issues faced . patient will work with SW monthly to address concerns related to pain issues faced and mobility issues faced  Interventions: . 1:1 collaboration with [name], [name] regarding development and update of comprehensive plan of care as evidenced by provider attestation and co-signature . Talked with client about pain issues of client . Talked with client about walking issues of client  . Talked with client about sleeping issues of client . Talked with client about transport needs of client . Talked with client about client use of supplements . Talked with client about upcoming client appointments . Talked with client about appetite of client . Talked with client about home health needs of client . Talked with client about vision of client . Talked with client about family support (son of client is supportive) . Talked with client about food procurement of client  Patient Self Care Activities:  . Self administers medications as prescribed . Attends all scheduled provider appointments . Performs ADL's independently  Patient Coping Strengths:  . Family . Friends  Patient Self Care Deficits:  . Anxiety issues . Pain issues; mobility issues  Patient Goals:  - spend time or talk with others at least 2 to 3 times per week - practice relaxation or meditation daily - keep a calendar with appointment dates  Follow Up Plan: LCSW to call client on 08/25/20     [name].Ryleeann Urquiza MSW, LCSW Licensed Clinical Social Worker [facility] Care Management [phone]

## 2020-08-25 ENCOUNTER — Telehealth: Payer: Medicare HMO

## 2020-09-19 ENCOUNTER — Ambulatory Visit: Payer: Self-pay | Admitting: Nurse Practitioner

## 2020-09-21 ENCOUNTER — Encounter: Payer: Self-pay | Admitting: Nurse Practitioner

## 2020-10-05 ENCOUNTER — Telehealth: Payer: Medicare HMO

## 2020-10-12 ENCOUNTER — Other Ambulatory Visit: Payer: Self-pay | Admitting: Nurse Practitioner

## 2020-10-12 DIAGNOSIS — D508 Other iron deficiency anemias: Secondary | ICD-10-CM

## 2020-10-27 ENCOUNTER — Telehealth: Payer: Self-pay | Admitting: Nurse Practitioner

## 2020-10-27 NOTE — Telephone Encounter (Signed)
Left message for patient to call back and schedule Medicare Annual Wellness Visit (AWV)   Last AWV 12/13/17 ; please schedule at anytime with health coach  This should be a 45 minute visit.

## 2020-11-10 ENCOUNTER — Other Ambulatory Visit: Payer: Self-pay | Admitting: Nurse Practitioner

## 2020-11-10 DIAGNOSIS — D508 Other iron deficiency anemias: Secondary | ICD-10-CM

## 2020-11-10 NOTE — Telephone Encounter (Signed)
MMM NTBS 30 days given 10/12/20

## 2020-11-16 ENCOUNTER — Ambulatory Visit (INDEPENDENT_AMBULATORY_CARE_PROVIDER_SITE_OTHER): Payer: Medicare HMO | Admitting: Licensed Clinical Social Worker

## 2020-11-16 DIAGNOSIS — J439 Emphysema, unspecified: Secondary | ICD-10-CM

## 2020-11-16 DIAGNOSIS — I10 Essential (primary) hypertension: Secondary | ICD-10-CM

## 2020-11-16 DIAGNOSIS — E785 Hyperlipidemia, unspecified: Secondary | ICD-10-CM

## 2020-11-16 DIAGNOSIS — F411 Generalized anxiety disorder: Secondary | ICD-10-CM

## 2020-11-16 DIAGNOSIS — I5032 Chronic diastolic (congestive) heart failure: Secondary | ICD-10-CM

## 2020-11-16 DIAGNOSIS — N1832 Chronic kidney disease, stage 3b: Secondary | ICD-10-CM

## 2020-11-16 NOTE — Chronic Care Management (AMB) (Signed)
Chronic Care Management    Clinical Social Work Note  11/16/2020 Name: Steven [name] MRN: [id number] DOB: [birth year]  Steven [name] is a 87 y.o. year old male who is a primary care patient of [name], [place]. The CCM team was consulted to assist the patient with chronic disease management and/or care coordination needs related to: [employer] .   Engaged with patient by telephone for follow up visit in response to provider referral for social work chronic care management and care coordination services.   Consent to Services:  The patient was given information about Chronic Care Management services, agreed to services, and gave verbal consent prior to initiation of services.  Please see initial visit note for detailed documentation.   Patient agreed to services and consent obtained.   Assessment: Review of patient past medical history, allergies, medications, and health status, including review of relevant consultants reports was performed today as part of a comprehensive evaluation and provision of chronic care management and care coordination services.     SDOH (Social Determinants of Health) assessments and interventions performed:  SDOH Interventions    Flowsheet Row Most Recent Value  SDOH Interventions   Physical Activity Interventions Other (Comments)  [client has walking challenges.  Client uses a walker to help him walk]  Depression Interventions/Treatment  Medication        Advanced Directives Status: See Vynca application for related entries.  CCM Care Plan  No Known Allergies  Outpatient Encounter Medications as of 11/16/2020  Medication Sig Note   albuterol (PROVENTIL) (2.5 MG/3ML) 0.083% nebulizer solution Take 3 mLs (2.5 mg total) by nebulization every 2 (two) hours as needed for wheezing or shortness of breath. 04/17/2016: PRN Only   aspirin 81 MG tablet Take 81 mg by mouth daily.  08/04/2015: Received from: External Pharmacy   atorvastatin  (LIPITOR) 10 MG tablet Take 1 tablet (10 mg total) by mouth daily.    clonazePAM (KLONOPIN) 1 MG tablet Take 1 tablet (1 mg total) by mouth 2 (two) times daily.    diltiazem (CARDIZEM CD) 120 MG 24 hr capsule Take 1 capsule (120 mg total) by mouth daily.    doxazosin (CARDURA) 2 MG tablet Take 1 tablet (2 mg total) by mouth daily.    furosemide (LASIX) 20 MG tablet Take 1 tablet (20 mg total) by mouth every other day.    ipratropium-albuterol (DUONEB) 0.5-2.5 (3) MG/3ML SOLN Take 3 mLs by nebulization 3 (three) times daily. 12/31/2017: Patient reports infrequent use; does not know when he received his nebulizer; no DME maintenance being performed   iron polysaccharides (IFEREX 150) 150 MG capsule Take 1 capsule (150 mg total) by mouth daily. (NEEDS TO BE SEEN BEFORE NEXT REFILL)    No facility-administered encounter medications on file as of 11/16/2020.    Patient Active Problem List   Diagnosis Date Noted   CHF (congestive heart failure) ([place]) 08/27/2017   Dementia ([place]) 08/27/2017   Chronic anemia 08/27/2017   Sundowning 08/09/2017   Goals of care, counseling/discussion    Palliative care by specialist    DNR (do not resuscitate) discussion    Abdominal bruit 10/15/2014   CKD (chronic kidney disease), stage III ([place]) 09/28/2014   BPH (benign prostatic hyperplasia) 02/25/2013   Squamous cell carcinoma of lung ([place]) 11/13/2011   COPD with emphysema ([place]) 07/29/2007   Hyperlipidemia with target LDL less than 100 07/28/2007   Anxiety state 07/28/2007   ERECTILE DYSFUNCTION 07/28/2007   Essential hypertension 07/28/2007   Coronary  atherosclerosis 07/28/2007    Conditions to be addressed/monitored: monitor client management of anxiety issues  Care Plan : LCSW care plan  Updates made by Katha Cabal, LCSW since 11/16/2020 12:00 AM     Problem: Emotional Distress      Goal: Emotional Health Supported;Manage Anxiety issues faced   Start Date: 11/16/2020  Expected End Date:  02/09/2021  This Visit's Progress: On track  Recent Progress: On track  Priority: Medium  Note:   Current Barriers:  Chronic Mental Health needs related to anxiety and anxiety management Some pain issues; some mobility issues Suicidal Ideation/Homicidal Ideation: No  Clinical Social Work Goal(s):  patient will work with SW monthly by telephone or in person to reduce or manage symptoms related to anxiety issues faced patient will work with SW monthly to address concerns related to pain issues faced and mobility issues faced  Interventions: 1:1 collaboration with [name], [name] regarding development and update of comprehensive plan of care as evidenced by provider attestation and co-signature Discussed with client the pain issues of client. Discussed with client walking challenges of client. He said he uses a walker to help him ambulate. Reviewed with client food procurement of client. Client said his son procures grocery items for client. Client said he is eating adequately Reviewed with client his use of supplements. He said he drinks one can of Boost supplement each morning. Discussed with client the relaxation techniques of client. He said he likes playing guitar. 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 LCSW encouraged client to play guitar, as he is able for relaxation and to manage stress issues. He said he can still play guitar Reviewed family support with client.  He said his son is very supportive Reviewed with client the mood status of client. He said he felt his mood was very stable. He said he uses Klonopin  as prescribed. He said he sees his son each AM. Client said he is sleeping well and feels that his mood is stable.  Patient Self Care Activities:  Self administers medications as prescribed Attends all scheduled provider appointments Performs ADL's independently  Patient Coping Strengths:   Family Friends  Patient Self Care Deficits:  Anxiety issues Pain issues; mobility issues  Patient Goals:  - spend time or talk with others at least 2 to 3 times per week - practice relaxation or meditation daily - keep a calendar with appointment dates  Follow Up Plan: LCSW to call client or son of client on 01/06/21 at 10:00 AM to assess client needs       [name].Bradi Arbuthnot MSW, LCSW Licensed Clinical Social Worker [facility] Care Management [phone]

## 2020-11-16 NOTE — Patient Instructions (Signed)
Visit Information  PATIENT GOALS:  Goals Addressed             This Visit's Progress    Protect My Health;Manage Anxiety symptoms       Timeframe:  Short-Term Goal Priority:  Medium Progress: On Track Start Date:          11/16/20            Expected End Date:        02/09/21            Follow Up Date 01/06/21 at 10:00 AM   Protect My Health (Patient)   Manage anxiety issues faced    Why is this important?   Screening tests can find diseases early when they are easier to treat.  Your doctor or nurse will talk with you about which tests are important for you.  Getting shots for common diseases like the flu and shingles will help prevent them.     Patient Self Care Activities:  Self administers medications as prescribed Attends all scheduled provider appointments Performs ADL's independently  Patient Coping Strengths:  Family Friends  Patient Self Care Deficits:  Anxiety issues Pain issues; mobility issues  Patient Goals:  - spend time or talk with others at least 2 to 3 times per week - practice relaxation or meditation daily - keep a calendar with appointment dates  Follow Up Plan: LCSW to call client or his son on 01/06/21 at 10:00 AM to assess client needs     [name].Shley Dolby MSW, LCSW Licensed Clinical Social Worker [facility] Care Management [phone]

## 2020-11-30 ENCOUNTER — Ambulatory Visit: Payer: Medicare HMO | Admitting: *Deleted

## 2020-11-30 DIAGNOSIS — F411 Generalized anxiety disorder: Secondary | ICD-10-CM

## 2020-11-30 DIAGNOSIS — I10 Essential (primary) hypertension: Secondary | ICD-10-CM

## 2020-11-30 NOTE — Chronic Care Management (AMB) (Signed)
Chronic Care Management   CCM RN Visit Note  11/30/2020 Name: Steven [name] MRN: [id number] DOB: 03/02/1928  Subjective: Steven [name] is a 87 y.o. year old male who is a primary care patient of Steven [name], [place]. The care management team was consulted for assistance with disease management and care coordination needs.    Engaged with patient by telephone for follow up visit in response to provider referral for case management and/or care coordination services.   Consent to Services:  The patient was given information about Chronic Care Management services, agreed to services, and gave verbal consent prior to initiation of services.  Please see initial visit note for detailed documentation.   Patient agreed to services and verbal consent obtained.   Assessment: Review of patient past medical history, allergies, medications, health status, including review of consultants reports, laboratory and other test data, was performed as part of comprehensive evaluation and provision of chronic care management services.   SDOH (Social Determinants of Health) assessments and interventions performed:    CCM Care Plan  No Known Allergies  Outpatient Encounter Medications as of 11/30/2020  Medication Sig Note   albuterol (PROVENTIL) (2.5 MG/3ML) 0.083% nebulizer solution Take 3 mLs (2.5 mg total) by nebulization every 2 (two) hours as needed for wheezing or shortness of breath. 04/17/2016: PRN Only   aspirin 81 MG tablet Take 81 mg by mouth daily.  08/04/2015: Received from: External Pharmacy   atorvastatin (LIPITOR) 10 MG tablet Take 1 tablet (10 mg total) by mouth daily.    clonazePAM (KLONOPIN) 1 MG tablet Take 1 tablet (1 mg total) by mouth 2 (two) times daily.    diltiazem (CARDIZEM CD) 120 MG 24 hr capsule Take 1 capsule (120 mg total) by mouth daily.    doxazosin (CARDURA) 2 MG tablet Take 1 tablet (2 mg total) by mouth daily.    furosemide (LASIX) 20 MG tablet Take 1 tablet (20 mg  total) by mouth every other day.    ipratropium-albuterol (DUONEB) 0.5-2.5 (3) MG/3ML SOLN Take 3 mLs by nebulization 3 (three) times daily. 12/31/2017: Patient reports infrequent use; does not know when he received his nebulizer; no DME maintenance being performed   iron polysaccharides (IFEREX 150) 150 MG capsule Take 1 capsule (150 mg total) by mouth daily. (NEEDS TO BE SEEN BEFORE NEXT REFILL)    No facility-administered encounter medications on file as of 11/30/2020.    Patient Active Problem List   Diagnosis Date Noted   CHF (congestive heart failure) ([place]) 08/27/2017   Dementia ([place]) 08/27/2017   Chronic anemia 08/27/2017   Sundowning 08/09/2017   Goals of care, counseling/discussion    Palliative care by specialist    DNR (do not resuscitate) discussion    Abdominal bruit 10/15/2014   CKD (chronic kidney disease), stage III ([place]) 09/28/2014   BPH (benign prostatic hyperplasia) 02/25/2013   Squamous cell carcinoma of lung ([place]) 11/13/2011   COPD with emphysema ([place]) 07/29/2007   Hyperlipidemia with target LDL less than 100 07/28/2007   Anxiety state 07/28/2007   ERECTILE DYSFUNCTION 07/28/2007   Essential hypertension 07/28/2007   Coronary atherosclerosis 07/28/2007    Conditions to be addressed/monitored:CHF, HTN, and Dementia  Care Plan : [name]  Updates made by Steven China, RN since 11/30/2020 12:00 AM     Problem: Chronic Disease Management Needs   Priority: High  Onset Date: 11/30/2020     Goal: Patient Will Work with RN Care Manager Regarding Care Management and Care Coordination  Associated with Anxiety state, HTN, COPD, CHF, Dementia, CKD,HLD   Start Date: 11/30/2020  Expected End Date: 11/30/2021  This Visit's Progress: On track  Priority: High  Note:   Current Barriers:  Chronic Disease Management support and education needs related to Anxiety state, HTN, COPD, CHF, Dementia, CKD,HLD  RNCM Clinical Goal(s):  Patient will continue to work  with RN Care Manager and/or Social Worker to address care management and care coordination needs related to Anxiety state, HTN, COPD, CHF, Dementia, CKD,HLD  through collaboration with Consulting civil engineer, provider, and care team.   Interventions: 1:1 collaboration with primary care provider regarding development and update of comprehensive plan of care as evidenced by provider attestation and co-signature Inter-disciplinary care team collaboration (see longitudinal plan of care) Evaluation of current treatment plan related to  self management and patient's adherence to plan as established by provider Discussed family/social support Son is very supportive. He assists with groceries, medications, and appointments. Discussed importance of socialization Patient is no longer driving and stays at home most of the time but he lives in an apartment building and has neighbors that he talks with frequently   Hypertension: (Status: Goal on track: YES.) Last practice recorded BP readings:  BP Readings from Last 3 Encounters:  05/13/20 (!) 87/52  03/21/20 136/72  12/19/19 124/79  Encouraged patient to check and record blood pressure daily and to call PCP with any readings outside of recommended range Reviewed upcoming appointments Reviewed medications and discussed importance of compliance His son brings his medicines by each morning for him to take Discussed diet and appetite Eats meals regularly and states that appetite is good Encouraged to eat a low sodium/DASH diet  Patient Goals/Self-Care Activities: Patient will self administer medications as prescribed as evidenced by self report/primary caregiver report  Patient will attend all scheduled provider appointments as evidenced by clinician review of documented attendance to scheduled appointments and patient/caregiver report Patient will call pharmacy for medication refills as evidenced by patient report and review of pharmacy fill history as  appropriate Patient will continue to perform ADL's independently as evidenced by patient/caregiver report Patient will check and record blood pressure daily and call PCP with any readings outside of recommended range Patient will call RN Care Manager as needed [phone] Patient will talk with LCSW regarding psychosocial concerns     Plan:Telephone follow up appointment with care management team member scheduled for:  01/02/21 with RNCM The patient has been provided with contact information for the care management team and has been advised to call with any health related questions or concerns.   Chong Sicilian, BSN, RN-BC Embedded Chronic Care Manager [facility] / [place] [facility] Direct Dial: [phone]

## 2020-11-30 NOTE — Patient Instructions (Signed)
Visit Information    Patient Care Plan: [facility] Care Plan     Problem Identified: Chronic Disease Management Needs   Priority: High  Onset Date: 11/30/2020     Goal: Patient Will Work with RN Care Manager Regarding Care Management and [place] with Anxiety state, HTN, COPD, CHF, Dementia, CKD,HLD   Start Date: 11/30/2020  Expected End Date: 11/30/2021  This Visit's Progress: On track  Priority: High  Note:   Current Barriers:  Chronic Disease Management support and education needs related to Anxiety state, HTN, COPD, CHF, Dementia, CKD,HLD  RNCM Clinical Goal(s):  Patient will continue to work with RN Care Manager and/or Social Worker to address care management and care coordination needs related to Anxiety state, HTN, COPD, CHF, Dementia, CKD,HLD  through collaboration with Consulting civil engineer, provider, and care team.   Interventions: 1:1 collaboration with primary care provider regarding development and update of comprehensive plan of care as evidenced by provider attestation and co-signature Inter-disciplinary care team collaboration (see longitudinal plan of care) Evaluation of current treatment plan related to  self management and patient's adherence to plan as established by provider Discussed family/social support Son is very supportive. He assists with groceries, medications, and appointments. Discussed importance of socialization Patient is no longer driving and stays at home most of the time but he lives in an apartment building and has neighbors that he talks with frequently   Hypertension: (Status: Goal on track: YES.) Last practice recorded BP readings:  BP Readings from Last 3 Encounters:  05/13/20 (!) 87/52  03/21/20 136/72  12/19/19 124/79  Encouraged patient to check and record blood pressure daily and to call PCP with any readings outside of recommended range Reviewed upcoming appointments Reviewed medications and discussed importance of  compliance His son brings his medicines by each morning for him to take Discussed diet and appetite Eats meals regularly and states that appetite is good Encouraged to eat a low sodium/DASH diet  Patient Goals/Self-Care Activities: Patient will self administer medications as prescribed as evidenced by self report/primary caregiver report  Patient will attend all scheduled provider appointments as evidenced by clinician review of documented attendance to scheduled appointments and patient/caregiver report Patient will call pharmacy for medication refills as evidenced by patient report and review of pharmacy fill history as appropriate Patient will continue to perform ADL's independently as evidenced by patient/caregiver report Patient will check and record blood pressure daily and call PCP with any readings outside of recommended range Patient will call RN Care Manager as needed [phone] Patient will talk with LCSW regarding psychosocial concerns      The patient verbalized understanding of instructions, educational materials, and care plan provided today and declined offer to receive copy of patient instructions, educational materials, and care plan.    Plan:Telephone follow up appointment with care management team member scheduled for:  01/02/21 with RNCM The patient has been provided with contact information for the care management team and has been advised to call with any health related questions or concerns.   Chong Sicilian, BSN, RN-BC Embedded Chronic Care Manager [facility] / [place] [facility] Direct Dial: [phone]

## 2020-12-05 DIAGNOSIS — J439 Emphysema, unspecified: Secondary | ICD-10-CM

## 2020-12-05 DIAGNOSIS — E785 Hyperlipidemia, unspecified: Secondary | ICD-10-CM

## 2020-12-05 DIAGNOSIS — I10 Essential (primary) hypertension: Secondary | ICD-10-CM

## 2020-12-05 DIAGNOSIS — I5032 Chronic diastolic (congestive) heart failure: Secondary | ICD-10-CM | POA: Diagnosis not present

## 2020-12-20 ENCOUNTER — Telehealth: Payer: Self-pay | Admitting: Nurse Practitioner

## 2020-12-20 NOTE — Telephone Encounter (Signed)
No answer unable to leave a  message for patient to call back and schedule Medicare Annual Wellness Visit (AWV) to be completed by video or phone.   Last AWV: 12/13/2017  Please schedule at anytime with [facility] Health Advisor.  45 minute appointment  Any questions, please contact me at [phone]

## 2020-12-22 DIAGNOSIS — Z23 Encounter for immunization: Secondary | ICD-10-CM | POA: Diagnosis not present

## 2020-12-22 DIAGNOSIS — F419 Anxiety disorder, unspecified: Secondary | ICD-10-CM | POA: Diagnosis not present

## 2020-12-22 DIAGNOSIS — E785 Hyperlipidemia, unspecified: Secondary | ICD-10-CM | POA: Diagnosis not present

## 2020-12-22 DIAGNOSIS — I10 Essential (primary) hypertension: Secondary | ICD-10-CM | POA: Diagnosis not present

## 2021-01-02 ENCOUNTER — Telehealth: Payer: Self-pay

## 2021-01-02 ENCOUNTER — Telehealth: Payer: Medicare HMO

## 2021-01-02 NOTE — Chronic Care Management (AMB) (Signed)
  Care Management   Note  01/02/2021 Name: Steven [name] MRN: [id number] DOB: May 13, 1928  Matti Minney Guimaraes is a 87 y.o. year old male who is a primary care patient of [name], [place] and is actively engaged with the care management team. I reached out to [place] by phone today to assist with re-scheduling a follow up visit with the RN Case Manager  Follow up plan: Telephone appointment with care management team member scheduled for:02/07/2021  Noreene Larsson, [place], [place], [place] [address] Direct Dial: [phone] [email or web address]

## 2021-01-06 ENCOUNTER — Ambulatory Visit (INDEPENDENT_AMBULATORY_CARE_PROVIDER_SITE_OTHER): Payer: Medicare HMO | Admitting: Licensed Clinical Social Worker

## 2021-01-06 DIAGNOSIS — E785 Hyperlipidemia, unspecified: Secondary | ICD-10-CM

## 2021-01-06 DIAGNOSIS — F411 Generalized anxiety disorder: Secondary | ICD-10-CM

## 2021-01-06 DIAGNOSIS — I10 Essential (primary) hypertension: Secondary | ICD-10-CM

## 2021-01-06 DIAGNOSIS — J439 Emphysema, unspecified: Secondary | ICD-10-CM

## 2021-01-06 DIAGNOSIS — N1832 Chronic kidney disease, stage 3b: Secondary | ICD-10-CM

## 2021-01-06 DIAGNOSIS — I5032 Chronic diastolic (congestive) heart failure: Secondary | ICD-10-CM

## 2021-01-06 NOTE — Patient Instructions (Addendum)
Visit Information  Patient Goals:  Protect My Health (Patient). Manage anxiety issues faced  Timeframe:  Short-Term Goal Priority:  Medium Progress: On Track Start Date:          01/06/21              Expected End Date:        04/03/21            Follow Up Date   03/07/21 at 11:15 AM   Protect My Health (Patient)   Manage anxiety issues faced    Why is this important?   Screening tests can find diseases early when they are easier to treat.  Your doctor or nurse will talk with you about which tests are important for you.  Getting shots for common diseases like the flu and shingles will help prevent them.     Patient Self Care Activities:  Self administers medications as prescribed Attends all scheduled provider appointments Performs ADL's independently  Patient Coping Strengths:  Family Friends  Patient Self Care Deficits:  Anxiety issues Pain issues; mobility issues  Patient Goals:  - spend time or talk with others at least 2 to 3 times per week - practice relaxation or meditation daily - keep a calendar with appointment dates  Follow Up Plan: LCSW to call client or his son on 03/07/21 at 11:15 AM to assess client needs    [name].Calynn Ferrero MSW, LCSW Licensed Clinical Social Worker [facility] Care Management [phone]

## 2021-01-06 NOTE — Chronic Care Management (AMB) (Signed)
Chronic Care Management    Clinical Social Work Note  01/06/2021 Name: Steven [name] MRN: [id number] DOB: [birth year]  Steven [name] is a 87 y.o. year old male who is a primary care patient of [name], [place]. The CCM team was consulted to assist the patient with chronic disease management and/or care coordination needs related to: [employer] .   Engaged with patient by telephone for follow up visit in response to provider referral for social work chronic care management and care coordination services.   Consent to Services:  The patient was given information about Chronic Care Management services, agreed to services, and gave verbal consent prior to initiation of services.  Please see initial visit note for detailed documentation.   Patient agreed to services and consent obtained.   Assessment: Review of patient past medical history, allergies, medications, and health status, including review of relevant consultants reports was performed today as part of a comprehensive evaluation and provision of chronic care management and care coordination services.     SDOH (Social Determinants of Health) assessments and interventions performed:  SDOH Interventions    Flowsheet Row Most Recent Value  SDOH Interventions   Physical Activity Interventions Other (Comments)  [client has walking challenges. uses a walker to help him walk]  Stress Interventions Provide Counseling  [client has some stress related to managing medical needs]  Depression Interventions/Treatment  Medication        Advanced Directives Status: See Vynca application for related entries.  CCM Care Plan  No Known Allergies  Outpatient Encounter Medications as of 01/06/2021  Medication Sig Note   albuterol (PROVENTIL) (2.5 MG/3ML) 0.083% nebulizer solution Take 3 mLs (2.5 mg total) by nebulization every 2 (two) hours as needed for wheezing or shortness of breath. 04/17/2016: PRN Only   aspirin 81 MG tablet  Take 81 mg by mouth daily.  08/04/2015: Received from: External Pharmacy   atorvastatin (LIPITOR) 10 MG tablet Take 1 tablet (10 mg total) by mouth daily.    clonazePAM (KLONOPIN) 1 MG tablet Take 1 tablet (1 mg total) by mouth 2 (two) times daily.    diltiazem (CARDIZEM CD) 120 MG 24 hr capsule Take 1 capsule (120 mg total) by mouth daily.    doxazosin (CARDURA) 2 MG tablet Take 1 tablet (2 mg total) by mouth daily.    furosemide (LASIX) 20 MG tablet Take 1 tablet (20 mg total) by mouth every other day.    ipratropium-albuterol (DUONEB) 0.5-2.5 (3) MG/3ML SOLN Take 3 mLs by nebulization 3 (three) times daily. 12/31/2017: Patient reports infrequent use; does not know when he received his nebulizer; no DME maintenance being performed   iron polysaccharides (IFEREX 150) 150 MG capsule Take 1 capsule (150 mg total) by mouth daily. (NEEDS TO BE SEEN BEFORE NEXT REFILL)    No facility-administered encounter medications on file as of 01/06/2021.    Patient Active Problem List   Diagnosis Date Noted   CHF (congestive heart failure) ([place]) 08/27/2017   Dementia ([place]) 08/27/2017   Chronic anemia 08/27/2017   Sundowning 08/09/2017   Goals of care, counseling/discussion    Palliative care by specialist    DNR (do not resuscitate) discussion    Abdominal bruit 10/15/2014   CKD (chronic kidney disease), stage III ([place]) 09/28/2014   BPH (benign prostatic hyperplasia) 02/25/2013   Squamous cell carcinoma of lung ([place]) 11/13/2011   COPD with emphysema ([place]) 07/29/2007   Hyperlipidemia with target LDL less than 100 07/28/2007   Anxiety state 07/28/2007  ERECTILE DYSFUNCTION 07/28/2007   Essential hypertension 07/28/2007   Coronary atherosclerosis 07/28/2007    Conditions to be addressed/monitored: monitor client management of anxiety issues  Care Plan : LCSW care plan  Updates made by Katha Cabal, LCSW since 01/06/2021 12:00 AM     Problem: Emotional Distress      Goal: Emotional Health  Supported;Manage Anxiety issues faced   Start Date: 01/06/2021  Expected End Date: 04/03/2021  This Visit's Progress: On track  Recent Progress: On track  Priority: Medium  Note:   Current Barriers:  Chronic Mental Health needs related to anxiety and anxiety management Some pain issues; some mobility issues Suicidal Ideation/Homicidal Ideation: No  Clinical Social Work Goal(s):  patient will work with SW monthly by telephone or in person to reduce or manage symptoms related to anxiety issues faced patient will work with SW monthly to address concerns related to pain issues faced and mobility issues faced  Interventions: 1:1 collaboration with [name], [name] regarding development and update of comprehensive plan of care as evidenced by provider attestation and co-signature Discussed with client the pain issues of client. Client said he has occasional knee pain Discussed with client walking challenges of client. He said he uses a walker to help him ambulate. Reviewed with client food procurement of client. Client said his son procures grocery items for client. Client said he is eating adequately Reviewed with client his use of supplements. He said he drinks one can of Boost supplement each morning. Discussed with client the relaxation techniques of client. He said he likes playing guitar. 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 LCSW encouraged client to play guitar, as he is able for relaxation and to manage stress issues. He said he can still play guitar Reviewed family support with client.  He said his son is very supportive Reviewed with client the mood status of client. He said he felt his mood was very stable. He said he uses Klonopin  as prescribed. He said he sees his son each AM. Client said he is sleeping well and feels that his mood is stable. Discussed client socialization with others. He said he has  several friends at apartment complex and enjoys talking with friends at apartment complex. Discussed transport needs. He said that his son transports him to and from medical appointments  Patient Self Care Activities:  Self administers medications as prescribed Attends all scheduled provider appointments Performs ADL's independently  Patient Coping Strengths:  Family Friends  Patient Self Care Deficits:  Anxiety issues Pain issues; mobility issues  Patient Goals:  - spend time or talk with others at least 2 to 3 times per week - practice relaxation or meditation daily - keep a calendar with appointment dates  Follow Up Plan: LCSW to call client or son of client on 03/07/21 at 11:15 AM to assess client needs       [name].Alona Danford MSW, LCSW Licensed Clinical Social Worker [facility] Care Management [phone]

## 2021-01-25 ENCOUNTER — Telehealth: Payer: Self-pay | Admitting: Nurse Practitioner

## 2021-01-25 NOTE — Telephone Encounter (Signed)
Left message for patient to call back and schedule Medicare Annual Wellness Visit (AWV) to be completed by video or phone.   Last AWV: 12/15/2018  Please schedule at anytime with [place]  45 minute appointment  Any questions, please contact me at [phone]

## 2021-02-04 DIAGNOSIS — I10 Essential (primary) hypertension: Secondary | ICD-10-CM

## 2021-02-04 DIAGNOSIS — I5032 Chronic diastolic (congestive) heart failure: Secondary | ICD-10-CM

## 2021-02-04 DIAGNOSIS — J439 Emphysema, unspecified: Secondary | ICD-10-CM

## 2021-02-04 DIAGNOSIS — E785 Hyperlipidemia, unspecified: Secondary | ICD-10-CM

## 2021-02-07 ENCOUNTER — Ambulatory Visit (INDEPENDENT_AMBULATORY_CARE_PROVIDER_SITE_OTHER): Payer: Medicare HMO | Admitting: *Deleted

## 2021-02-07 DIAGNOSIS — I10 Essential (primary) hypertension: Secondary | ICD-10-CM

## 2021-02-07 DIAGNOSIS — F411 Generalized anxiety disorder: Secondary | ICD-10-CM

## 2021-02-07 DIAGNOSIS — F05 Delirium due to known physiological condition: Secondary | ICD-10-CM

## 2021-02-13 NOTE — Chronic Care Management (AMB) (Signed)
Chronic Care Management   CCM RN Visit Note  02/07/2021 Name: Steven [name] MRN: [id number] DOB: 1928-03-13  Subjective: Steven [name] is a 88 y.o. year old male who is a primary care patient of [name], [place]. The care management team was consulted for assistance with disease management and care coordination needs.    Engaged with patient by telephone for follow up visit in response to provider referral for case management and/or care coordination services.   Consent to Services:  The patient was given information about Chronic Care Management services, agreed to services, and gave verbal consent prior to initiation of services.  Please see initial visit note for detailed documentation.   Patient agreed to services and verbal consent obtained.   Assessment: Review of patient past medical history, allergies, medications, health status, including review of consultants reports, laboratory and other test data, was performed as part of comprehensive evaluation and provision of chronic care management services.   SDOH (Social Determinants of Health) assessments and interventions performed:    CCM Care Plan  No Known Allergies  Outpatient Encounter Medications as of 02/07/2021  Medication Sig Note   albuterol (PROVENTIL) (2.5 MG/3ML) 0.083% nebulizer solution Take 3 mLs (2.5 mg total) by nebulization every 2 (two) hours as needed for wheezing or shortness of breath. 04/17/2016: PRN Only   aspirin 81 MG tablet Take 81 mg by mouth daily.  08/04/2015: Received from: External Pharmacy   atorvastatin (LIPITOR) 10 MG tablet Take 1 tablet (10 mg total) by mouth daily.    clonazePAM (KLONOPIN) 1 MG tablet Take 1 tablet (1 mg total) by mouth 2 (two) times daily.    diltiazem (CARDIZEM CD) 120 MG 24 hr capsule Take 1 capsule (120 mg total) by mouth daily.    doxazosin (CARDURA) 2 MG tablet Take 1 tablet (2 mg total) by mouth daily.    furosemide (LASIX) 20 MG tablet Take 1 tablet (20 mg total)  by mouth every other day.    ipratropium-albuterol (DUONEB) 0.5-2.5 (3) MG/3ML SOLN Take 3 mLs by nebulization 3 (three) times daily. 12/31/2017: Patient reports infrequent use; does not know when he received his nebulizer; no DME maintenance being performed   iron polysaccharides (IFEREX 150) 150 MG capsule Take 1 capsule (150 mg total) by mouth daily. (NEEDS TO BE SEEN BEFORE NEXT REFILL)    No facility-administered encounter medications on file as of 02/07/2021.    Patient Active Problem List   Diagnosis Date Noted   CHF (congestive heart failure) ([place]) 08/27/2017   Dementia ([place]) 08/27/2017   Chronic anemia 08/27/2017   Sundowning 08/09/2017   Goals of care, counseling/discussion    Palliative care by specialist    DNR (do not resuscitate) discussion    Abdominal bruit 10/15/2014   CKD (chronic kidney disease), stage III ([place]) 09/28/2014   BPH (benign prostatic hyperplasia) 02/25/2013   Squamous cell carcinoma of lung ([place]) 11/13/2011   COPD with emphysema ([place]) 07/29/2007   Hyperlipidemia with target LDL less than 100 07/28/2007   Anxiety state 07/28/2007   ERECTILE DYSFUNCTION 07/28/2007   Essential hypertension 07/28/2007   Coronary atherosclerosis 07/28/2007    Conditions to be addressed/monitored:HTN, Anxiety, and Dementia  Care Plan : [facility] Care Plan     Problem: Chronic Disease Management Needs   Priority: High  Onset Date: 11/30/2020     Long-Range Goal: Patient Will Work with RN Care Manager Regarding Care Management and Care Coordination Associated with Anxiety state, HTN, COPD, CHF, Dementia, CKD,HLD  Start Date: 11/30/2020  Expected End Date: 11/30/2021  This Visit's Progress: On track  Recent Progress: On track  Priority: High  Note:   Current Barriers:  Chronic Disease Management support and education needs related to Anxiety state, HTN, COPD, CHF, Dementia, CKD,HLD  RNCM Clinical Goal(s):  Patient will continue to work with RN Care Manager and/or  Social Worker to address care management and care coordination needs related to Anxiety state, HTN, COPD, CHF, Dementia, CKD,HLD  through collaboration with Consulting civil engineer, provider, and care team.   Interventions: 1:1 collaboration with primary care provider regarding development and update of comprehensive plan of care as evidenced by provider attestation and co-signature Inter-disciplinary care team collaboration (see longitudinal plan of care) Evaluation of current treatment plan related to  self management and patient's adherence to plan as established by provider Discussed family/social support Son is very supportive. He assists with groceries, medications, and appointments. Discussed importance of socialization Patient is no longer driving and stays at home most of the time but he lives in an apartment building and has neighbors that he talks with frequently   Hypertension: (Status: Goal on track: YES.) Last practice recorded BP readings:  BP Readings from Last 3 Encounters:  05/13/20 (!) 87/52  03/21/20 136/72  12/19/19 124/79  Encouraged patient to check and record blood pressure daily and to call PCP with any readings outside of recommended range Reviewed upcoming appointments Reviewed medications and discussed importance of compliance His son brings his medicines by each morning for him to take Discussed diet and appetite Eats meals regularly and states that appetite is good Encouraged to eat a low sodium/DASH diet   Anxiety:  (Status: Goal on Track (progressing): YES.) Long Term Goal  Reviewed medications and importance of compliance Discussed family/social support and importance of staying socially active Reviewed recent telephone visit with LCSW Encourage patient to continue talking with LCSW regarding psychosocial concerns Reviewed upcoming appointments and verified transportation Therapeutic listening utilized regarding health related anxiety and dementia  Patient  Goals/Self-Care Activities: Patient will self administer medications as prescribed as evidenced by self report/primary caregiver report  Patient will attend all scheduled provider appointments as evidenced by clinician review of documented attendance to scheduled appointments and patient/caregiver report Patient will call pharmacy for medication refills as evidenced by patient report and review of pharmacy fill history as appropriate Patient will continue to perform ADL's independently as evidenced by patient/caregiver report Patient will check and record blood pressure daily and call PCP with any readings outside of recommended range Patient will call RN Care Manager as needed [phone] Patient will talk with LCSW regarding psychosocial concerns  Plan:Telephone follow up appointment with care management team member scheduled for:  03/21/21 with RNCM The patient has been provided with contact information for the care management team and has been advised to call with any health related questions or concerns.   Chong Sicilian, BSN, RN-BC Embedded Chronic Care Manager [facility] / [place] Management Direct Dial: [phone]

## 2021-02-13 NOTE — Patient Instructions (Signed)
Visit Information  Patient Goals/Self-Care Activities: Patient will self administer medications as prescribed as evidenced by self report/primary caregiver report  Patient will attend all scheduled provider appointments as evidenced by clinician review of documented attendance to scheduled appointments and patient/caregiver report Patient will call pharmacy for medication refills as evidenced by patient report and review of pharmacy fill history as appropriate Patient will continue to perform ADL's independently as evidenced by patient/caregiver report Patient will check and record blood pressure daily and call PCP with any readings outside of recommended range Patient will call RN Care Manager as needed [phone] Patient will talk with LCSW regarding psychosocial concerns  The patient verbalized understanding of instructions, educational materials, and care plan provided today and declined offer to receive copy of patient instructions, educational materials, and care plan.   Plan:Telephone follow up appointment with care management team member scheduled for:  03/21/21 with RNCM The patient has been provided with contact information for the care management team and has been advised to call with any health related questions or concerns.   Chong Sicilian, BSN, RN-BC Embedded Chronic Care Manager [facility] / [facility] Care Management Direct Dial: [phone]    Chong Sicilian, BSN, RN-BC [place] / [place] Management Direct Dial: [phone]

## 2021-02-22 ENCOUNTER — Telehealth: Payer: Self-pay

## 2021-02-22 NOTE — Telephone Encounter (Signed)
Pt scheduled for AWV today. Tried to contact via home and cell numbers and left messages on both. No return call. Thank you.

## 2021-03-07 ENCOUNTER — Ambulatory Visit: Payer: Medicare HMO

## 2021-03-07 DIAGNOSIS — I10 Essential (primary) hypertension: Secondary | ICD-10-CM

## 2021-03-07 DIAGNOSIS — F05 Delirium due to known physiological condition: Secondary | ICD-10-CM

## 2021-03-07 DIAGNOSIS — E785 Hyperlipidemia, unspecified: Secondary | ICD-10-CM | POA: Diagnosis not present

## 2021-03-07 DIAGNOSIS — F411 Generalized anxiety disorder: Secondary | ICD-10-CM

## 2021-03-07 DIAGNOSIS — N1832 Chronic kidney disease, stage 3b: Secondary | ICD-10-CM

## 2021-03-07 DIAGNOSIS — I5032 Chronic diastolic (congestive) heart failure: Secondary | ICD-10-CM | POA: Diagnosis not present

## 2021-03-07 NOTE — Patient Instructions (Addendum)
Visit Information  Patient Goals:  Protect My Health (patient).  Manage anxiety issues faced  Timeframe:  Short-Term Goal Priority:  Medium Progress: On Track Start Date:         03/07/21              Expected End Date:        05/30/21         Follow Up Date   04/27/21 at 9:00 AM   Protect My Health (Patient)   Manage anxiety issues faced    Why is this important?   Screening tests can find diseases early when they are easier to treat.  Your doctor or nurse will talk with you about which tests are important for you.  Getting shots for common diseases like the flu and shingles will help prevent them.     Patient Self Care Activities:  Self administers medications as prescribed Attends all scheduled provider appointments Performs ADL's independently  Patient Coping Strengths:  Family Friends  Patient Self Care Deficits:  Anxiety issues Pain issues; mobility issues  Patient Goals:  - spend time or talk with others at least 2 to 3 times per week - practice relaxation or meditation daily - keep a calendar with appointment dates  Follow Up Plan: LCSW to call client or his son on 04/27/21 at 9:00 AM to assess client needs    [name].Reynalda Canny MSW, [place] Holiday representative [facility] Care Management [phone]

## 2021-03-07 NOTE — Chronic Care Management (AMB) (Signed)
Chronic Care Management    Clinical Social Work Note  03/07/2021 Name: Steven [name] MRN: [id number] DOB: 1929/01/18  Steven [name] is a 88 y.o. year old male who is a primary care patient of [name], [place]. The CCM team was consulted to assist the patient with chronic disease management and/or care coordination needs related to: [employer] .   Engaged with patient by telephone for follow up visit in response to provider referral for social work chronic care management and care coordination services.   Consent to Services:  The patient was given information about Chronic Care Management services, agreed to services, and gave verbal consent prior to initiation of services.  Please see initial visit note for detailed documentation.   Patient agreed to services and consent obtained.   Assessment: Review of patient past medical history, allergies, medications, and health status, including review of relevant consultants reports was performed today as part of a comprehensive evaluation and provision of chronic care management and care coordination services.     SDOH (Social Determinants of Health) assessments and interventions performed:  SDOH Interventions    Flowsheet Row Most Recent Value  SDOH Interventions   Physical Activity Interventions Other (Comments)  [walking challenges . he uses a walker to help him walk]  Stress Interventions Provide Counseling  [client has stress related to managing medical needs]  Depression Interventions/Treatment  Medication        Advanced Directives Status: See Vynca application for related entries.  CCM Care Plan  No Known Allergies  Outpatient Encounter Medications as of 03/07/2021  Medication Sig Note   albuterol (PROVENTIL) (2.5 MG/3ML) 0.083% nebulizer solution Take 3 mLs (2.5 mg total) by nebulization every 2 (two) hours as needed for wheezing or shortness of breath. 04/17/2016: PRN Only   aspirin 81 MG tablet Take 81 mg by  mouth daily.  08/04/2015: Received from: External Pharmacy   atorvastatin (LIPITOR) 10 MG tablet Take 1 tablet (10 mg total) by mouth daily.    clonazePAM (KLONOPIN) 1 MG tablet Take 1 tablet (1 mg total) by mouth 2 (two) times daily.    diltiazem (CARDIZEM CD) 120 MG 24 hr capsule Take 1 capsule (120 mg total) by mouth daily.    doxazosin (CARDURA) 2 MG tablet Take 1 tablet (2 mg total) by mouth daily.    furosemide (LASIX) 20 MG tablet Take 1 tablet (20 mg total) by mouth every other day.    ipratropium-albuterol (DUONEB) 0.5-2.5 (3) MG/3ML SOLN Take 3 mLs by nebulization 3 (three) times daily. 12/31/2017: Patient reports infrequent use; does not know when he received his nebulizer; no DME maintenance being performed   iron polysaccharides (IFEREX 150) 150 MG capsule Take 1 capsule (150 mg total) by mouth daily. (NEEDS TO BE SEEN BEFORE NEXT REFILL)    No facility-administered encounter medications on file as of 03/07/2021.    Patient Active Problem List   Diagnosis Date Noted   CHF (congestive heart failure) ([place]) 08/27/2017   Dementia ([place]) 08/27/2017   Chronic anemia 08/27/2017   Sundowning 08/09/2017   Goals of care, counseling/discussion    Palliative care by specialist    DNR (do not resuscitate) discussion    Abdominal bruit 10/15/2014   CKD (chronic kidney disease), stage III ([place]) 09/28/2014   BPH (benign prostatic hyperplasia) 02/25/2013   Squamous cell carcinoma of lung ([place]) 11/13/2011   COPD with emphysema ([place]) 07/29/2007   Hyperlipidemia with target LDL less than 100 07/28/2007   Anxiety state 07/28/2007  ERECTILE DYSFUNCTION 07/28/2007   Essential hypertension 07/28/2007   Coronary atherosclerosis 07/28/2007    Conditions to be addressed/monitored:  monitor client management of anxiety issues  Care Plan : LCSW care plan  Updates made by Katha Cabal, LCSW since 03/07/2021 12:00 AM     Problem: Emotional Distress      Goal: Emotional Health  Supported;Manage Anxiety issues faced   Start Date: 03/07/2021  Expected End Date: 05/30/2021  This Visit's Progress: On track  Recent Progress: On track  Priority: Medium  Note:   Current Barriers:  Chronic Mental Health needs related to anxiety and anxiety management Some pain issues; some mobility issues Suicidal Ideation/Homicidal Ideation: No Depression issues  Clinical Social Work Goal(s):  patient will work with SW monthly by telephone or in person to reduce or manage symptoms related to anxiety issues faced patient will work with SW monthly to address concerns related to pain issues faced and mobility issues faced Patient will attend all scheduled medical appointments in next 30 days Patient will work with SW monthly by telephone or in person to reduce or manage depression issues of client  Interventions: 1:1 collaboration with [name], [name] regarding development and update of comprehensive plan of care as evidenced by provider attestation and co-signature Discussed with client the pain issues of client. Client said he has occasional knee pain Discussed with client walking challenges of client. He said he uses a walker to help him ambulate. Reviewed with client food procurement of client. Client said his son procures grocery items for client. Client said he is eating adequately Reviewed with client his use of supplements. He said he is not using supplements like Boost or Ensure at this time Discussed with client the relaxation techniques of client. He said he likes playing guitar occasionally. LCSW talked with client about client listening to [employer] or Country music for relaxation. Reviewed family support with client.  He said his son is very supportive Reviewed with client the mood status of client. He said he felt his mood was very stable. He said he uses Klonopin  as prescribed. He said he sees his son each AM. Client said he is sleeping well and feels that  his mood is stable. Discussed transport needs. He said that his son transports him to and from medical appointments Encouraged client to call RNCM as needed for nursing support for client Provided counseling support for client  Patient Self Care Activities:  Self administers medications as prescribed Attends all scheduled provider appointments Performs ADL's independently  Patient Coping Strengths:  Family Friends  Patient Self Care Deficits:  Anxiety issues Pain issues; mobility issues  Patient Goals:  - spend time or talk with others at least 2 to 3 times per week - practice relaxation or meditation daily - keep a calendar with appointment dates  Follow Up Plan: LCSW to call client or son of client on 04/27/21 at 9:00 AM to assess client needs       [name].Noam Karaffa MSW, [place] Holiday representative [facility] Care Management [phone]

## 2021-03-09 ENCOUNTER — Telehealth: Payer: Self-pay | Admitting: Nurse Practitioner

## 2021-03-09 NOTE — Telephone Encounter (Signed)
Error - patient answered

## 2021-03-15 ENCOUNTER — Ambulatory Visit (INDEPENDENT_AMBULATORY_CARE_PROVIDER_SITE_OTHER): Payer: Medicare HMO

## 2021-03-15 VITALS — Ht 67.0 in | Wt 114.0 lb

## 2021-03-15 DIAGNOSIS — Z0000 Encounter for general adult medical examination without abnormal findings: Secondary | ICD-10-CM | POA: Diagnosis not present

## 2021-03-15 DIAGNOSIS — C229 Malignant neoplasm of liver, not specified as primary or secondary: Secondary | ICD-10-CM | POA: Insufficient documentation

## 2021-03-15 DIAGNOSIS — H6120 Impacted cerumen, unspecified ear: Secondary | ICD-10-CM | POA: Insufficient documentation

## 2021-03-15 DIAGNOSIS — I219 Acute myocardial infarction, unspecified: Secondary | ICD-10-CM | POA: Insufficient documentation

## 2021-03-15 DIAGNOSIS — K8020 Calculus of gallbladder without cholecystitis without obstruction: Secondary | ICD-10-CM | POA: Insufficient documentation

## 2021-03-15 NOTE — Patient Instructions (Signed)
Steven [name] , Thank you for taking time to come for your Medicare Wellness Visit. I appreciate your ongoing commitment to your health goals. Please review the following plan we discussed and let me know if I can assist you in the future.   Screening recommendations/referrals: Colonoscopy: Done 09/29/2014. No longer required due at age.   Recommended yearly ophthalmology/optometry visit for glaucoma screening and checkup Recommended yearly dental visit for hygiene and checkup  Vaccinations: Influenza vaccine: Done 12/22/2020. Repeat annually  Pneumococcal vaccine: Done 09/06/2003 and 06/18/2014 Tdap vaccine: Due Repeat in 10 years  Shingles vaccine: Discussed.   Covid-19: Declined.  Advanced directives: Advance directive discussed with you today. Even though you declined this today, please call our office should you change your mind, and we can give you the proper paperwork for you to fill out.   Conditions/risks identified: Aim for 30 minutes of exercise or brisk walking each day, drink 6-8 glasses of water and eat lots of fruits and vegetables.   Next appointment: Follow up in one year for your annual wellness visit. 2024.  Preventive Care 61 Years and Older, Male  Preventive care refers to lifestyle choices and visits with your health care provider that can promote health and wellness. What does preventive care include? A yearly physical exam. This is also called an annual well check. Dental exams once or twice a year. Routine eye exams. Ask your health care provider how often you should have your eyes checked. Personal lifestyle choices, including: Daily care of your teeth and gums. Regular physical activity. Eating a healthy diet. Avoiding tobacco and drug use. Limiting alcohol use. Practicing safe sex. Taking low doses of aspirin every day. Taking vitamin and mineral supplements as recommended by your health care provider. What happens during an annual well check? The  services and screenings done by your health care provider during your annual well check will depend on your age, overall health, lifestyle risk factors, and family history of disease. Counseling  Your health care provider may ask you questions about your: Alcohol use. Tobacco use. Drug use. Emotional well-being. Home and relationship well-being. Sexual activity. Eating habits. History of falls. Memory and ability to understand (cognition). Work and work Statistician. Screening  You may have the following tests or measurements: Height, weight, and BMI. Blood pressure. Lipid and cholesterol levels. These may be checked every 5 years, or more frequently if you are over 65 years old. Skin check. Lung cancer screening. You may have this screening every year starting at age 4 if you have a 30-pack-year history of smoking and currently smoke or have quit within the past 15 years. Fecal occult blood test (FOBT) of the stool. You may have this test every year starting at age 86. Flexible sigmoidoscopy or colonoscopy. You may have a sigmoidoscopy every 5 years or a colonoscopy every 10 years starting at age 9. Prostate cancer screening. Recommendations will vary depending on your family history and other risks. Hepatitis C blood test. Hepatitis B blood test. Sexually transmitted disease (STD) testing. Diabetes screening. This is done by checking your blood sugar (glucose) after you have not eaten for a while (fasting). You may have this done every 1-3 years. Abdominal aortic aneurysm (AAA) screening. You may need this if you are a current or former smoker. Osteoporosis. You may be screened starting at age 41 if you are at high risk. Talk with your health care provider about your test results, treatment options, and if necessary, the need for more tests. Vaccines  Your health care provider may recommend certain vaccines, such as: Influenza vaccine. This is recommended every year. Tetanus,  diphtheria, and acellular pertussis (Tdap, Td) vaccine. You may need a Td booster every 10 years. Zoster vaccine. You may need this after age 66. Pneumococcal 13-valent conjugate (PCV13) vaccine. One dose is recommended after age 35. Pneumococcal polysaccharide (PPSV23) vaccine. One dose is recommended after age 56. Talk to your health care provider about which screenings and vaccines you need and how often you need them. This information is not intended to replace advice given to you by your health care provider. Make sure you discuss any questions you have with your health care provider. Document Released: 02/18/2015 Document Revised: 10/12/2015 Document Reviewed: 11/23/2014 Elsevier Interactive Patient Education  2017 [employer] Prevention in the Home Falls can cause injuries. They can happen to people of all ages. There are many things you can do to make your home safe and to help prevent falls. What can I do on the outside of my home? Regularly fix the edges of walkways and driveways and fix any cracks. Remove anything that might make you trip as you walk through a door, such as a raised step or threshold. Trim any bushes or trees on the path to your home. Use bright outdoor lighting. Clear any walking paths of anything that might make someone trip, such as rocks or tools. Regularly check to see if handrails are loose or broken. Make sure that both sides of any steps have handrails. Any raised decks and porches should have guardrails on the edges. Have any leaves, snow, or ice cleared regularly. Use sand or salt on walking paths during winter. Clean up any spills in your garage right away. This includes oil or grease spills. What can I do in the bathroom? Use night lights. Install grab bars by the toilet and in the tub and shower. Do not use towel bars as grab bars. Use non-skid mats or decals in the tub or shower. If you need to sit down in the shower, use a plastic,  non-slip stool. Keep the floor dry. Clean up any water that spills on the floor as soon as it happens. Remove soap buildup in the tub or shower regularly. Attach bath mats securely with double-sided non-slip rug tape. Do not have throw rugs and other things on the floor that can make you trip. What can I do in the bedroom? Use night lights. Make sure that you have a light by your bed that is easy to reach. Do not use any sheets or blankets that are too big for your bed. They should not hang down onto the floor. Have a firm chair that has side arms. You can use this for support while you get dressed. Do not have throw rugs and other things on the floor that can make you trip. What can I do in the kitchen? Clean up any spills right away. Avoid walking on wet floors. Keep items that you use a lot in easy-to-reach places. If you need to reach something above you, use a strong step stool that has a grab bar. Keep electrical cords out of the way. Do not use floor polish or wax that makes floors slippery. If you must use wax, use non-skid floor wax. Do not have throw rugs and other things on the floor that can make you trip. What can I do with my stairs? Do not leave any items on the stairs. Make sure that there are  handrails on both sides of the stairs and use them. Fix handrails that are broken or loose. Make sure that handrails are as long as the stairways. Check any carpeting to make sure that it is firmly attached to the stairs. Fix any carpet that is loose or worn. Avoid having throw rugs at the top or bottom of the stairs. If you do have throw rugs, attach them to the floor with carpet tape. Make sure that you have a light switch at the top of the stairs and the bottom of the stairs. If you do not have them, ask someone to add them for you. What else can I do to help prevent falls? Wear shoes that: Do not have high heels. Have rubber bottoms. Are comfortable and fit you well. Are closed  at the toe. Do not wear sandals. If you use a stepladder: Make sure that it is fully opened. Do not climb a closed stepladder. Make sure that both sides of the stepladder are locked into place. Ask someone to hold it for you, if possible. Clearly mark and make sure that you can see: Any grab bars or handrails. First and last steps. Where the edge of each step is. Use tools that help you move around (mobility aids) if they are needed. These include: Canes. Walkers. Scooters. Crutches. Turn on the lights when you go into a dark area. Replace any light bulbs as soon as they burn out. Set up your furniture so you have a clear path. Avoid moving your furniture around. If any of your floors are uneven, fix them. If there are any pets around you, be aware of where they are. Review your medicines with your doctor. Some medicines can make you feel dizzy. This can increase your chance of falling. Ask your doctor what other things that you can do to help prevent falls. This information is not intended to replace advice given to you by your health care provider. Make sure you discuss any questions you have with your health care provider. Document Released: 11/18/2008 Document Revised: 06/30/2015 Document Reviewed: 02/26/2014 Elsevier Interactive Patient Education  2017 [employer].

## 2021-03-15 NOTE — Progress Notes (Signed)
Subjective:   Steven [name] is a 88 y.o. male who presents for Medicare Annual/Subsequent preventive examination. Virtual Visit via Telephone Note  I connected with  Steven [name] on 03/15/21 at 12:00 PM EST by telephone and verified that I am speaking with the correct person using two identifiers.  Location: Patient: HOME Provider: [facility] Persons participating in the virtual visit: patient/Nurse Health Advisor   I discussed the limitations, risks, security and privacy concerns of performing an evaluation and management service by telephone and the availability of in person appointments. The patient expressed understanding and agreed to proceed.  Interactive audio and video telecommunications were attempted between this nurse and patient, however failed, due to patient having technical difficulties OR patient did not have access to video capability.  We continued and completed visit with audio only.  Some vital signs may be absent or patient reported.   Chriss Driver, LPN  Review of Systems     Cardiac Risk Factors include: advanced age (>51men, >67 women);hypertension;dyslipidemia;male gender;sedentary lifestyle;Other (see comment), Risk factor comments: CHF, Dementia  PHONE VISIT. PT AT HOME. NURSE AT [facility].    Objective:    Today's Vitals   03/15/21 1155  Weight: 114 lb (51.7 kg)  Height: 5\' 7"  (1.702 m)   Body mass index is 17.85 kg/m.  Advanced Directives 03/15/2021 12/19/2019 12/31/2017 12/13/2017 12/13/2017 08/27/2017 08/07/2017  Does Patient Have a Medical Advance Directive? No No No No No No No  Would patient like information on creating a medical advance directive? No - Patient declined - (No Data) No - Patient declined No - Patient declined No - Patient declined No - Patient declined  Pre-existing out of facility DNR order (yellow form or pink MOST form) - - - - - - -    Current Medications (verified) Outpatient Encounter Medications as of 03/15/2021  Medication Sig    albuterol (PROVENTIL) (2.5 MG/3ML) 0.083% nebulizer solution Take 3 mLs (2.5 mg total) by nebulization every 2 (two) hours as needed for wheezing or shortness of breath.   aspirin 81 MG tablet Take 81 mg by mouth daily.    atorvastatin (LIPITOR) 10 MG tablet Take 1 tablet (10 mg total) by mouth daily.   clonazePAM (KLONOPIN) 1 MG tablet Take 1 tablet (1 mg total) by mouth 2 (two) times daily.   diltiazem (CARDIZEM CD) 120 MG 24 hr capsule Take 1 capsule (120 mg total) by mouth daily.   doxazosin (CARDURA) 2 MG tablet Take 1 tablet (2 mg total) by mouth daily.   furosemide (LASIX) 20 MG tablet Take 1 tablet (20 mg total) by mouth every other day.   ipratropium-albuterol (DUONEB) 0.5-2.5 (3) MG/3ML SOLN Take 3 mLs by nebulization 3 (three) times daily.   iron polysaccharides (IFEREX 150) 150 MG capsule Take 1 capsule (150 mg total) by mouth daily. (NEEDS TO BE SEEN BEFORE NEXT REFILL)   No facility-administered encounter medications on file as of 03/15/2021.    Allergies (verified) Patient has no known allergies.   History: Past Medical History:  Diagnosis Date   Anxiety    Cancer ([place]) 2013   Lung - cancer free x3 years.    Hyperlipidemia    Hypertension    Myocardial infarction [facility]) 1999   Past Surgical History:  Procedure Laterality Date   CARPAL TUNNEL RELEASE  1980's   right and left   CHOLECYSTECTOMY     COLONOSCOPY N/A 09/29/2014   Procedure: COLONOSCOPY;  Surgeon: Rogene Houston, MD;  Location: [facility]  ENDO SUITE;  Service: Endoscopy;  Laterality: N/A;   CORONARY ARTERY BYPASS GRAFT  1999   5 grafts   FLEXIBLE BRONCHOSCOPY  11/07/2011   Procedure: FLEXIBLE BRONCHOSCOPY;  Surgeon: Gaye Pollack, MD;  Location: [facility] OR;  Service: Thoracic;  Laterality: N/A;   HERNIA REPAIR  6834   umbilical hernia repair   JOINT REPLACEMENT  2000   right shoulder rotator cuff repair   Family History  Problem Relation Age of Onset   Cancer Mother        pancreatic   Pulmonary embolism  Father    Anxiety disorder Other    Social History   Socioeconomic History   Marital status: Divorced    Spouse name: Not on file   Number of children: 2   Years of education: 6   Highest education level: 6th grade  Occupational History   Occupation: Retired    Comment: Charity fundraiser  Tobacco Use   Smoking status: Former    Packs/day: 2.00    Years: 73.00    Pack years: 146.00    Types: Cigarettes    Start date: 02/05/2006    Quit date: 11/05/2006    Years since quitting: 14.3   Smokeless tobacco: Current   Tobacco comments:    uses camel patches  Vaping Use   Vaping Use: Never used  Substance and Sexual Activity   Alcohol use: No   Drug use: No   Sexual activity: Not Currently  Other Topics Concern   Not on file  Social History Narrative   Not on file   Social Determinants of Health   Financial Resource Strain: Low Risk    Difficulty of Paying Living Expenses: Not very hard  Food Insecurity: No Food Insecurity   Worried About Charity fundraiser in the Last Year: Never true   Ran Out of Food in the Last Year: Never true  Transportation Needs: No Transportation Needs   Lack of Transportation (Medical): No   Lack of Transportation (Non-Medical): No  Physical Activity: Inactive   Days of Exercise per Week: 0 days   Minutes of Exercise per Session: 0 min  Stress: No Stress Concern Present   Feeling of Stress : Not at all  Social Connections: Socially Isolated   Frequency of Communication with Friends and Family: More than three times a week   Frequency of Social Gatherings with Friends and Family: More than three times a week   Attends Religious Services: Never   Marine scientist or Organizations: No   Attends Music therapist: Never   Marital Status: Divorced    Tobacco Counseling Ready to quit: Not Answered Counseling given: Not Answered Tobacco comments: uses camel patches   Clinical Intake:  Pre-visit preparation completed: Yes  Pain  : No/denies pain     BMI - recorded: 17.85 Nutritional Status: BMI <19  Underweight Nutritional Risks: Unintentional weight loss Diabetes: No  How often do you need to have someone help you when you read instructions, pamphlets, or other written materials from your doctor or pharmacy?: 1 - Never  Diabetic?NO  Interpreter Needed?: No  Information entered by :: mj Dericka Ostenson, lpn   Activities of Daily Living In your present state of health, do you have any difficulty performing the following activities: 03/15/2021  Hearing? N  Vision? N  Difficulty concentrating or making decisions? Y  Comment Memory at times.  Walking or climbing stairs? N  Dressing or bathing? N  Doing errands, shopping? N  Preparing Food and eating ? N  Using the Toilet? N  In the past six months, have you accidently leaked urine? N  Do you have problems with loss of bowel control? N  Managing your Medications? N  Managing your Finances? N  Housekeeping or managing your Housekeeping? N  Some recent data might be hidden    Patient Care Team: [name], [name] as PCP - General (Family Medicine) Herminio Commons, MD (Inactive) as PCP - Cardiology (Cardiology) Gaye Pollack, MD (Cardiothoracic Surgery) Tyler Pita, MD (Radiation Oncology) Herminio Commons, MD (Inactive) as Attending Physician (Cardiology) Ilean China, RN as Case Manager Katha Cabal, LCSW as Social Worker (Licensed Clinical Social Worker)  Indicate any recent [employer] you may have received from other than [facility] providers in the past year (date may be approximate).     Assessment:   This is a routine wellness examination for [facility].  Hearing/Vision screen Hearing Screening - Comments:: No hearing issues.  Vision Screening - Comments:: No glasses. Does not have eye exams.   Dietary issues and exercise activities discussed: Current Exercise Habits: The patient does not participate in regular exercise  at present, Exercise limited by: cardiac condition(s);neurologic condition(s)   Goals Addressed             This Visit's Progress    Exercise 3x per week (30 min per time)   Not on track    Seated Strengthening Handout        Depression Screen PHQ 2/9 Scores 03/15/2021 03/07/2021 01/06/2021 11/16/2020 07/12/2020 06/03/2020 05/13/2020  PHQ - 2 Score 0 2 2 2 2 2  0  PHQ- 9 Score 0 7 6 6 6 6  -    Fall Risk Fall Risk  03/15/2021 05/13/2020 03/21/2020 12/17/2019 12/14/2019  Falls in the past year? 0 1 0 0 0  Comment - - - - -  Number falls in past yr: 0 0 - - -  Injury with Fall? 0 0 - - -  Risk for fall due to : Impaired balance/gait;Impaired mobility History of fall(s) - - -  Risk for fall due to: Comment - - - - -  Follow up Falls prevention discussed Education provided - - -    FALL RISK PREVENTION PERTAINING TO THE HOME:  Any stairs in or around the home? Yes  If so, are there any without handrails? No  Home free of loose throw rugs in walkways, pet beds, electrical cords, etc? Yes  Adequate lighting in your home to reduce risk of falls? Yes   ASSISTIVE DEVICES UTILIZED TO PREVENT FALLS:  Life alert? No  Use of a cane, walker or w/c? Yes  Grab bars in the bathroom? No  Shower chair or bench in shower? No  Elevated toilet seat or a handicapped toilet? No   TIMED UP AND GO:  Was the test performed? No .  Phone visit.  Cognitive Function: MMSE - Mini Mental State Exam 08/22/2016  Orientation to time 5  Orientation to Place 5  Registration 3  Attention/ Calculation 5  Recall 0  Language- name 2 objects 2  Language- repeat 1  Language- follow 3 step command 2  Language- read & follow direction 1  Write a sentence 1  Copy design 1  Total score 26     6CIT Screen 03/15/2021 12/13/2017  What Year? 4 points 4 points  What month? 0 points 0 points  What time? 0 points 0 points  Count back from 20 0  points 4 points  Months in reverse 4 points 4 points  Repeat phrase 10  points 0 points  Total Score 18 12    Immunizations Immunization History  Administered Date(s) Administered   Fluad Quad(high Dose 65+) 12/17/2019   Influenza, High Dose Seasonal PF 12/22/2020   Influenza,inj,Quad PF,6+ Mos 11/24/2012, 11/12/2013   Pneumococcal Conjugate-13 06/18/2014   Pneumococcal Polysaccharide-23 09/06/2003    TDAP status: Due, Education has been provided regarding the importance of this vaccine. Advised may receive this vaccine at local pharmacy or Health Dept. Aware to provide a copy of the vaccination record if obtained from local pharmacy or Health Dept. Verbalized acceptance and understanding.  Flu Vaccine status: Up to date  Pneumococcal vaccine status: Up to date  Covid-19 vaccine status: Declined, Education has been provided regarding the importance of this vaccine but patient still declined. Advised may receive this vaccine at local pharmacy or Health Dept.or vaccine clinic. Aware to provide a copy of the vaccination record if obtained from local pharmacy or Health Dept. Verbalized acceptance and understanding.  Qualifies for Shingles Vaccine? Yes   Zostavax completed No   Shingrix Completed?: No.    Education has been provided regarding the importance of this vaccine. Patient has been advised to call insurance company to determine out of pocket expense if they have not yet received this vaccine. Advised may also receive vaccine at local pharmacy or Health Dept. Verbalized acceptance and understanding.  Screening Tests Health Maintenance  Topic Date Due   Zoster Vaccines- Shingrix (1 of 2) 06/12/2021 (Originally 04/23/1947)   Pneumonia Vaccine 13+ Years old  Completed   HPV VACCINES  Aged Out   INFLUENZA VACCINE  Discontinued   TETANUS/TDAP  Discontinued   COVID-19 Vaccine  Discontinued    Health Maintenance  There are no preventive care reminders to display for this patient.   Colorectal cancer screening: No longer required.   Lung Cancer  Screening: (Low Dose CT Chest recommended if Age 73-80 years, 30 pack-year currently smoking OR have quit w/in 15years.) does not qualify.  Additional Screening:  Hepatitis C Screening: does not qualify.  Vision Screening: Recommended annual ophthalmology exams for early detection of glaucoma and other disorders of the eye. Is the patient up to date with their annual eye exam?  No  Who is the provider or what is the name of the office in which the patient attends annual eye exams? N/A If pt is not established with a provider, would they like to be referred to a provider to establish care? No .   Dental Screening: Recommended annual dental exams for proper oral hygiene  Community Resource Referral / Chronic Care Management: CRR required this visit?  No   CCM required this visit?  No      Plan:     I have personally reviewed and noted the following in the patients chart:   Medical and social history Use of alcohol, tobacco or illicit drugs  Current medications and supplements including opioid prescriptions. Patient is not currently taking opioid prescriptions. Functional ability and status Nutritional status Physical activity Advanced directives List of other physicians Hospitalizations, surgeries, and ER visits in previous 12 months Vitals Screenings to include cognitive, depression, and falls Referrals and appointments  In addition, I have reviewed and discussed with patient certain preventive protocols, quality metrics, and best practice recommendations. A written personalized care plan for preventive services as well as general preventive health recommendations were provided to patient.     Doyle Askew  Langston Masker, [place]   10/06/7913   Nurse Notes: Discussed shingrix, flu and covid vaccines and how to obtain. Pt declines Optometry referral. 6CIT score of 14.

## 2021-03-21 ENCOUNTER — Ambulatory Visit (INDEPENDENT_AMBULATORY_CARE_PROVIDER_SITE_OTHER): Payer: Medicare HMO | Admitting: *Deleted

## 2021-03-21 DIAGNOSIS — F05 Delirium due to known physiological condition: Secondary | ICD-10-CM

## 2021-03-21 DIAGNOSIS — I10 Essential (primary) hypertension: Secondary | ICD-10-CM

## 2021-03-21 DIAGNOSIS — F411 Generalized anxiety disorder: Secondary | ICD-10-CM

## 2021-03-22 ENCOUNTER — Encounter: Payer: Self-pay | Admitting: *Deleted

## 2021-04-04 DIAGNOSIS — F05 Delirium due to known physiological condition: Secondary | ICD-10-CM | POA: Diagnosis not present

## 2021-04-04 DIAGNOSIS — I10 Essential (primary) hypertension: Secondary | ICD-10-CM | POA: Diagnosis not present

## 2021-04-20 ENCOUNTER — Ambulatory Visit (INDEPENDENT_AMBULATORY_CARE_PROVIDER_SITE_OTHER): Payer: Medicare HMO | Admitting: *Deleted

## 2021-04-20 DIAGNOSIS — F411 Generalized anxiety disorder: Secondary | ICD-10-CM

## 2021-04-20 DIAGNOSIS — F05 Delirium due to known physiological condition: Secondary | ICD-10-CM

## 2021-04-20 DIAGNOSIS — I10 Essential (primary) hypertension: Secondary | ICD-10-CM

## 2021-04-24 NOTE — Chronic Care Management (AMB) (Signed)
?Chronic Care Management  ? ?CCM RN Visit Note ? ?04/20/2021 ?Name: Steven [name] MRN: [id number] DOB: 17-Aug-1928 ? ?Subjective: ?Steven [name] is a 88 y.o. year old male who is a primary care patient of [name], [facility]. The care management team was consulted for assistance with disease management and care coordination needs.   ? ?Engaged with patient by telephone for follow up visit in response to provider referral for case management and/or care coordination services.  ? ?Consent to Services:  ?The patient was given information about Chronic Care Management services, agreed to services, and gave verbal consent prior to initiation of services.  Please see initial visit note for detailed documentation.  ? ?Patient agreed to services and verbal consent obtained.  ? ?Assessment: Review of patient past medical history, allergies, medications, health status, including review of consultants reports, laboratory and other test data, was performed as part of comprehensive evaluation and provision of chronic care management services.  ? ?SDOH (Social Determinants of Health) assessments and interventions performed:   ? ?CCM Care Plan ? ?No Known Allergies ? ?Outpatient Encounter Medications as of 04/20/2021  ?Medication Sig Note  ? albuterol (PROVENTIL) (2.5 MG/3ML) 0.083% nebulizer solution Take 3 mLs (2.5 mg total) by nebulization every 2 (two) hours as needed for wheezing or shortness of breath. 04/17/2016: PRN Only  ? aspirin 81 MG tablet Take 81 mg by mouth daily.  08/04/2015: Received from: External Pharmacy  ? atorvastatin (LIPITOR) 10 MG tablet Take 1 tablet (10 mg total) by mouth daily.   ? clonazePAM (KLONOPIN) 1 MG tablet Take 1 tablet (1 mg total) by mouth 2 (two) times daily.   ? diltiazem (CARDIZEM CD) 120 MG 24 hr capsule Take 1 capsule (120 mg total) by mouth daily.   ? doxazosin (CARDURA) 2 MG tablet Take 1 tablet (2 mg total) by mouth daily.   ? furosemide (LASIX) 20 MG tablet Take 1 tablet (20 mg  total) by mouth every other day.   ? ipratropium-albuterol (DUONEB) 0.5-2.5 (3) MG/3ML SOLN Take 3 mLs by nebulization 3 (three) times daily. 12/31/2017: Patient reports infrequent use; does not know when he received his nebulizer; no DME maintenance being performed  ? iron polysaccharides (IFEREX 150) 150 MG capsule Take 1 capsule (150 mg total) by mouth daily. (NEEDS TO BE SEEN BEFORE NEXT REFILL)   ? ?No facility-administered encounter medications on file as of 04/20/2021.  ? ? ?Patient Active Problem List  ? Diagnosis Date Noted  ? Gallstone 03/15/2021  ? Heart attack ([place]) 03/15/2021  ? Impacted cerumen 03/15/2021  ? Liver cancer ([place]) 03/15/2021  ? CHF (congestive heart failure) ([place]) 08/27/2017  ? Dementia ([place]) 08/27/2017  ? Chronic anemia 08/27/2017  ? Sundowning 08/09/2017  ? Goals of care, counseling/discussion   ? Palliative care by specialist   ? DNR (do not resuscitate) discussion   ? Abdominal bruit 10/15/2014  ? CKD (chronic kidney disease), stage III ([place]) 09/28/2014  ? BPH (benign prostatic hyperplasia) 02/25/2013  ? Squamous cell carcinoma of lung ([place]) 11/13/2011  ? COPD with emphysema ([place]) 07/29/2007  ? Hyperlipidemia with target LDL less than 100 07/28/2007  ? Anxiety state 07/28/2007  ? ERECTILE DYSFUNCTION 07/28/2007  ? Essential hypertension 07/28/2007  ? Coronary atherosclerosis 07/28/2007  ? ? ?Conditions to be addressed/monitored:HTN, Anxiety, Dementia, and fall risk ? ?Care Plan : RNCM Care Plan  ?  ? ?Problem: Chronic Disease Management Needs   ?Priority: High  ?Onset Date: 11/30/2020  ?  ? ?Long-Range Goal: Patient  Will Work with Consulting civil engineer Regarding Care Management and [place] with Anxiety state, HTN, COPD, CHF, Dementia, CKD,HLD   ?Start Date: 11/30/2020  ?Expected End Date: 11/30/2021  ?This Visit's Progress: On track  ?Recent Progress: On track  ?Priority: High  ?Note:   ?Current Barriers:  ?Chronic Disease Management support and education needs related to  Anxiety state, HTN, COPD, CHF, Dementia, CKD,HLD ? ?RNCM Clinical Goal(s):  ?Patient will continue to work with RN Care Manager and/or Social Worker to address care management and care coordination needs related to Anxiety state, HTN, COPD, CHF, Dementia, CKD,HLD  through collaboration with RN Care manager, provider, and care team.  ? ?Interventions: ?1:1 collaboration with primary care provider regarding development and update of comprehensive plan of care as evidenced by provider attestation and co-signature ?Inter-disciplinary care team collaboration (see longitudinal plan of care) ?Evaluation of current treatment plan related to  self management and patient's adherence to plan as established by provider ?Discussed family/social support ?Son is still very supportive. He assists with groceries, medications, and appointments. He comes over daily. ?Discussed importance of socialization ?Patient is no longer driving and stays at home most of the time but he lives in an apartment building and has neighbors that he talks with frequently ? ? ?Hypertension: (Status: Goal on track: YES.) ?Last practice recorded BP readings:  ?BP Readings from Last 3 Encounters:  ?05/13/20 (!) 87/52  ?03/21/20 136/72  ?12/19/19 124/79  ? ?Encouraged patient to check and record blood pressure daily and to call PCP with any readings outside of recommended range ?Reviewed upcoming appointments ?Reviewed medications and discussed importance of compliance ?His son brings his medicines by each morning for him to take ?Discussed diet and appetite ?Eats meals regularly and states that appetite is good ?Encouraged to eat a low sodium/DASH diet ?Discussed symptoms of hypotension and advised to call PCP with any symptoms or hypotensive readings ?Discussed fall prevention. Advised to to move carefully and change position slowly to avoid falls. ? ? ?Anxiety:  (Status: Goal on Track (progressing): YES.) Long Term Goal  ?Reviewed medications and  importance of compliance ?Discussed family/social support and importance of staying socially active ?Reviewed recent telephone visit with LCSW ?Encourage patient to continue talking with LCSW regarding psychosocial concerns ?Reviewed upcoming appointments and verified transportation ?Therapeutic listening utilized regarding health related anxiety and dementia ? ? ?Falls:  (Status: Goal on Track (progressing): YES.) Long Term Goal  ?Reviewed medications and discussed potential side effects of medications such as dizziness and frequent urination ?Advised patient of importance of notifying provider of falls ?Assessed for signs and symptoms of orthostatic hypotension ?Assessed for falls since last encounter ?Assessed social determinant of health barriers ? ?  ?Patient Goals/Self-Care Activities: ?Patient will self administer medications as prescribed as evidenced by self report/primary caregiver report  ?Patient will attend all scheduled provider appointments as evidenced by clinician review of documented attendance to scheduled appointments and patient/caregiver report ?Patient will call pharmacy for medication refills as evidenced by patient report and review of pharmacy fill history as appropriate ?Patient will continue to perform ADL's independently as evidenced by patient/caregiver report ?Patient will check and record blood pressure daily and call PCP with any readings outside of recommended range ?Patient will call RN Care Manager as needed [phone] ?Patient will talk with LCSW regarding psychosocial concerns ?Talk with friends/family daily ?Move carefully and change positions slowly to decrease risk for falls ?Keep phone with you at all times ?Consider a medical alert system ? ?Plan:Telephone follow  up appointment with care management team member scheduled for:  05/25/21 with RNCM ?The patient has been provided with contact information for the care management team and has been advised to call with any health  related questions or concerns.  ? ?Chong Sicilian, BSN, RN-BC ?Embedded Chronic Care Manager ?[place] [facility] ?Direct Dial: [phone] ?  ? ? ? ? ? ? ? ? ? ? ?

## 2021-04-24 NOTE — Patient Instructions (Signed)
Visit Information ? ?Patient Goals/Self-Care Activities: ?Patient will self administer medications as prescribed as evidenced by self report/primary caregiver report  ?Patient will attend all scheduled provider appointments as evidenced by clinician review of documented attendance to scheduled appointments and patient/caregiver report ?Patient will call pharmacy for medication refills as evidenced by patient report and review of pharmacy fill history as appropriate ?Patient will continue to perform ADL's independently as evidenced by patient/caregiver report ?Patient will check and record blood pressure daily and call PCP with any readings outside of recommended range ?Patient will call RN Care Manager as needed [phone] ?Patient will talk with LCSW regarding psychosocial concerns ?Talk with friends/family daily ?Move carefully and change positions slowly to decrease risk for falls ?Keep phone with you at all times ?Consider a medical alert system ? ?The patient verbalized understanding of instructions, educational materials, and care plan provided today and declined offer to receive copy of patient instructions, educational materials, and care plan.  ? ?Plan:Telephone follow up appointment with care management team member scheduled for:  05/25/21 with RNCM ?The patient has been provided with contact information for the care management team and has been advised to call with any health related questions or concerns.  ? ?Chong Sicilian, BSN, RN-BC ?Embedded Chronic Care Manager ?[place] [facility] ?Direct Dial: [phone] ?  ?

## 2021-04-27 ENCOUNTER — Ambulatory Visit: Payer: Medicare HMO | Admitting: Licensed Clinical Social Worker

## 2021-04-27 DIAGNOSIS — F411 Generalized anxiety disorder: Secondary | ICD-10-CM

## 2021-04-27 DIAGNOSIS — I5032 Chronic diastolic (congestive) heart failure: Secondary | ICD-10-CM

## 2021-04-27 DIAGNOSIS — I10 Essential (primary) hypertension: Secondary | ICD-10-CM

## 2021-04-27 NOTE — Patient Instructions (Addendum)
Visit Information ? ?Patient goals: Protect My Health (Patient).  Manage anxiety issues faced ? ?Timeframe:  Short-Term Goal ?Priority:  Medium ?Progress: On Track ?Start Date:         04/27/21             ?Expected End Date:        07/24/21       ?  ?Follow Up Date   06/21/21 at 2:00 PM ?  ?Protect My Health (Patient)   Manage anxiety issues faced  ?  ?Why is this important?   ?Screening tests can find diseases early when they are easier to treat.  ?Your doctor or nurse will talk with you about which tests are important for you.  ?Getting shots for common diseases like the flu and shingles will help prevent them.   ?  ?Patient Self Care Activities:  ?Self administers medications as prescribed ?Attends all scheduled provider appointments ?Performs ADL's independently ? ?Patient Coping Strengths:  ?Family ?Friends ? ?Patient Self Care Deficits:  ?Anxiety issues ?Pain issues; mobility issues ? ?Patient Goals:  ?- spend time or talk with others at least 2 to 3 times per week ?- practice relaxation or meditation daily ?- keep a calendar with appointment dates ? ?Follow Up Plan: LCSW to call client or his son on 06/21/21 at 2:00 PM    ? ?[name].Afreen Siebels MSW, LCSW ?Licensed Clinical Social Worker ?[facility] ?[phone] ?

## 2021-04-27 NOTE — Chronic Care Management (AMB) (Signed)
?Chronic Care Management  ? ? Clinical Social Work Note ? ?04/27/2021 ?Name: Steven [name] MRN: [id number] DOB: 09/26/1928 ? ?Steven [name] is a 88 y.o. year old male who is a primary care patient of [name], [facility]. The CCM team was consulted to assist the patient with chronic disease management and/or care coordination needs related to: [employer] .  ? ?Engaged with patient by telephone for follow up visit in response to provider referral for social work chronic care management and care coordination services.  ? ?Consent to Services:  ?The patient was given information about Chronic Care Management services, agreed to services, and gave verbal consent prior to initiation of services.  Please see initial visit note for detailed documentation.  ? ?Patient agreed to services and consent obtained.  ? ?Assessment: Review of patient past medical history, allergies, medications, and health status, including review of relevant consultants reports was performed today as part of a comprehensive evaluation and provision of chronic care management and care coordination services.    ? ?SDOH (Social Determinants of Health) assessments and interventions performed:  ?SDOH Interventions   ? ?Flowsheet Row Most Recent Value  ?SDOH Interventions   ?Physical Activity Interventions Other (Comments)  [walking challenges. uses a walker to help him walk]  ?Stress Interventions Provide Counseling  [client has some stress related to managing medical needs]  ?Depression Interventions/Treatment  Counseling  ? ?  ?  ? ?Advanced Directives Status: See Vynca application for related entries. ? ?CCM Care Plan ? ?No Known Allergies ? ?Outpatient Encounter Medications as of 04/27/2021  ?Medication Sig Note  ? albuterol (PROVENTIL) (2.5 MG/3ML) 0.083% nebulizer solution Take 3 mLs (2.5 mg total) by nebulization every 2 (two) hours as needed for wheezing or shortness of breath. 04/17/2016: PRN Only  ? aspirin 81 MG tablet Take 81 mg  by mouth daily.  08/04/2015: Received from: External Pharmacy  ? atorvastatin (LIPITOR) 10 MG tablet Take 1 tablet (10 mg total) by mouth daily.   ? clonazePAM (KLONOPIN) 1 MG tablet Take 1 tablet (1 mg total) by mouth 2 (two) times daily.   ? diltiazem (CARDIZEM CD) 120 MG 24 hr capsule Take 1 capsule (120 mg total) by mouth daily.   ? doxazosin (CARDURA) 2 MG tablet Take 1 tablet (2 mg total) by mouth daily.   ? furosemide (LASIX) 20 MG tablet Take 1 tablet (20 mg total) by mouth every other day.   ? ipratropium-albuterol (DUONEB) 0.5-2.5 (3) MG/3ML SOLN Take 3 mLs by nebulization 3 (three) times daily. 12/31/2017: Patient reports infrequent use; does not know when he received his nebulizer; no DME maintenance being performed  ? iron polysaccharides (IFEREX 150) 150 MG capsule Take 1 capsule (150 mg total) by mouth daily. (NEEDS TO BE SEEN BEFORE NEXT REFILL)   ? ?No facility-administered encounter medications on file as of 04/27/2021.  ? ? ?Patient Active Problem List  ? Diagnosis Date Noted  ? Gallstone 03/15/2021  ? Heart attack ([place]) 03/15/2021  ? Impacted cerumen 03/15/2021  ? Liver cancer ([place]) 03/15/2021  ? CHF (congestive heart failure) ([place]) 08/27/2017  ? Dementia ([place]) 08/27/2017  ? Chronic anemia 08/27/2017  ? Sundowning 08/09/2017  ? Goals of care, counseling/discussion   ? Palliative care by specialist   ? DNR (do not resuscitate) discussion   ? Abdominal bruit 10/15/2014  ? CKD (chronic kidney disease), stage III ([place]) 09/28/2014  ? BPH (benign prostatic hyperplasia) 02/25/2013  ? Squamous cell carcinoma of lung ([place]) 11/13/2011  ? COPD  with emphysema ([place]) 07/29/2007  ? Hyperlipidemia with target LDL less than 100 07/28/2007  ? Anxiety state 07/28/2007  ? ERECTILE DYSFUNCTION 07/28/2007  ? Essential hypertension 07/28/2007  ? Coronary atherosclerosis 07/28/2007  ? ? ?Conditions to be addressed/monitored: monitor client management of anxiety issues ? ?Care Plan : LCSW care plan  ?Updates made by  Katha Cabal, LCSW since 04/27/2021 12:00 AM  ?  ? ?Problem: Emotional Distress   ?  ? ?Goal: Emotional Health Supported;Manage Anxiety issues faced   ?Start Date: 04/27/2021  ?Expected End Date: 07/24/2021  ?This Visit's Progress: On track  ?Recent Progress: On track  ?Priority: Medium  ?Note:   ?Current Barriers:  ?Chronic Mental Health needs related to anxiety and anxiety management ?Some pain issues; some mobility issues ?Suicidal Ideation/Homicidal Ideation: No ?Depression issues ? ?Clinical Social Work Goal(s):  ?patient will work with SW monthly by telephone or in person to reduce or manage symptoms related to anxiety issues faced ?patient will work with SW monthly to address concerns related to pain issues faced and mobility issues faced ?Patient will attend all scheduled medical appointments in next 30 days ?Patient will work with SW monthly by telephone or in person to reduce or manage depression issues of client ? ?Interventions: ?1:1 collaboration with [name], [name] regarding development and update of comprehensive plan of care as evidenced by provider attestation and co-signature ?Discussed with client the pain issues of client. Client said he has occasional knee pain ?Discussed with client walking challenges of client. He said he uses a walker to help him ambulate. ?Reviewed with client food procurement of client. Client said his son procures grocery items for client. Client said he is eating adequately ?Reviewed with client his use of supplements. He said he  drinks a can of Boost supplement daily ?Reviewed family support with client.  He said his son is very supportive ?Reviewed with client the mood status of client. He said he felt his mood was very stable. He said he uses Klonopin  as prescribed. He said he sees his son each AM. Client said he is sleeping well and feels that his mood is stable. ?Discussed transport needs. He said that his son transports him to and from medical  appointments ?Encouraged client to call RNCM as needed for nursing support for client ?Provided counseling support for client ?Reviewed socialization of client. Client said he talks regularly with friends he has met at apartment complex. He enjoys talking with these friends ? ?Patient Self Care Activities:  ?Self administers medications as prescribed ?Attends all scheduled provider appointments ?Performs ADL's independently ? ?Patient Coping Strengths:  ?Family ?Friends ? ?Patient Self Care Deficits:  ?Anxiety issues ?Pain issues; mobility issues ? ?Patient Goals:  ?- spend time or talk with others at least 2 to 3 times per week ?- practice relaxation or meditation daily ?- keep a calendar with appointment dates ? ?Follow Up Plan: LCSW to call client or son of client on 06/21/21 at 2:00 PM    ?  ?[name].Josely Moffat MSW, LCSW ?Licensed Clinical Social Worker ?[facility] ?[phone] ?

## 2021-05-25 ENCOUNTER — Ambulatory Visit (INDEPENDENT_AMBULATORY_CARE_PROVIDER_SITE_OTHER): Payer: Medicare HMO | Admitting: *Deleted

## 2021-05-25 DIAGNOSIS — I10 Essential (primary) hypertension: Secondary | ICD-10-CM

## 2021-05-25 DIAGNOSIS — F05 Delirium due to known physiological condition: Secondary | ICD-10-CM

## 2021-05-25 DIAGNOSIS — F411 Generalized anxiety disorder: Secondary | ICD-10-CM

## 2021-05-25 NOTE — Patient Instructions (Signed)
Visit Information ? ?Patient Goals/Self-Care Activities: ?Patient will self administer medications as prescribed as evidenced by self report/primary caregiver report  ?Patient will attend all scheduled provider appointments as evidenced by clinician review of documented attendance to scheduled appointments and patient/caregiver report ?Patient will call pharmacy for medication refills as evidenced by patient report and review of pharmacy fill history as appropriate ?Patient will continue to perform ADL's independently as evidenced by patient/caregiver report ?Patient will check and record blood pressure daily and call PCP with any readings outside of recommended range ?Patient will call RN Care Manager as needed [phone] ?Patient will talk with LCSW regarding psychosocial concerns ?Talk with friends/family daily ?Move carefully and change positions slowly to decrease risk for falls ?Keep phone with you at all times ?Consider a medical alert system ?Talk with son about who is going to provide primary care services for you ?Schedule appointment with PCP at [facility] if you are going to continue using them for primary care  ? ?Plan: Telephone follow up appointment with care management team member scheduled for:  06/20/21 with RNCM ?The patient has been provided with contact information for the care management team and has been advised to call with any health related questions or concerns.  ? ? ?Chong Sicilian, BSN, RN-BC ?Embedded Chronic Care Manager ?[place] [facility] [place] [facility] ?Direct Dial: [phone] ? ?

## 2021-05-25 NOTE — Chronic Care Management (AMB) (Signed)
? Care Management ?  ? RN Visit Note ? ?05/25/2021 ?Name: Steven [name] MRN: [id number] DOB: 03-Aug-1928 ? ?Subjective: ?Steven [name] is a 88 y.o. year old male who is a primary care patient of [name], [place]. The care management team was consulted for assistance with disease management and care coordination needs.   ? ?Engaged with patient by telephone for follow up visit in response to provider referral for case management and/or care coordination services.  ? ?Consent to Services:  ? Steven [name] was given information about Care Management services today including:  ?Care Management services includes personalized support from designated clinical staff supervised by his physician, including individualized plan of care and coordination with other care providers ?24/7 contact phone numbers for assistance for urgent and routine care needs. ?The patient may stop case management services at any time by phone call to the office staff. ? ?Patient agreed to services and consent obtained.  ? ?Assessment: Review of patient past medical history, allergies, medications, health status, including review of consultants reports, laboratory and other test data, was performed as part of comprehensive evaluation and provision of chronic care management services.  ? ?SDOH (Social Determinants of Health) assessments and interventions performed:   ? ?Care Plan ? ?No Known Allergies ? ?Outpatient Encounter Medications as of 05/25/2021  ?Medication Sig Note  ? albuterol (PROVENTIL) (2.5 MG/3ML) 0.083% nebulizer solution Take 3 mLs (2.5 mg total) by nebulization every 2 (two) hours as needed for wheezing or shortness of breath. 04/17/2016: PRN Only  ? aspirin 81 MG tablet Take 81 mg by mouth daily.  08/04/2015: Received from: External Pharmacy  ? atorvastatin (LIPITOR) 10 MG tablet Take 1 tablet (10 mg total) by mouth daily.   ? clonazePAM (KLONOPIN) 1 MG tablet Take 1 tablet (1 mg total) by mouth 2 (two) times daily.   ? diltiazem  (CARDIZEM CD) 120 MG 24 hr capsule Take 1 capsule (120 mg total) by mouth daily.   ? doxazosin (CARDURA) 2 MG tablet Take 1 tablet (2 mg total) by mouth daily.   ? furosemide (LASIX) 20 MG tablet Take 1 tablet (20 mg total) by mouth every other day.   ? ipratropium-albuterol (DUONEB) 0.5-2.5 (3) MG/3ML SOLN Take 3 mLs by nebulization 3 (three) times daily. 12/31/2017: Patient reports infrequent use; does not know when he received his nebulizer; no DME maintenance being performed  ? iron polysaccharides (IFEREX 150) 150 MG capsule Take 1 capsule (150 mg total) by mouth daily. (NEEDS TO BE SEEN BEFORE NEXT REFILL)   ? ?No facility-administered encounter medications on file as of 05/25/2021.  ? ? ?Patient Active Problem List  ? Diagnosis Date Noted  ? Gallstone 03/15/2021  ? Heart attack ([place]) 03/15/2021  ? Impacted cerumen 03/15/2021  ? Liver cancer ([place]) 03/15/2021  ? CHF (congestive heart failure) ([place]) 08/27/2017  ? Dementia ([place]) 08/27/2017  ? Chronic anemia 08/27/2017  ? Sundowning 08/09/2017  ? Goals of care, counseling/discussion   ? Palliative care by specialist   ? DNR (do not resuscitate) discussion   ? Abdominal bruit 10/15/2014  ? CKD (chronic kidney disease), stage III ([place]) 09/28/2014  ? BPH (benign prostatic hyperplasia) 02/25/2013  ? Squamous cell carcinoma of lung ([place]) 11/13/2011  ? COPD with emphysema ([place]) 07/29/2007  ? Hyperlipidemia with target LDL less than 100 07/28/2007  ? Anxiety state 07/28/2007  ? ERECTILE DYSFUNCTION 07/28/2007  ? Essential hypertension 07/28/2007  ? Coronary atherosclerosis 07/28/2007  ? ? ?Conditions to be addressed/monitored: HTN and Dementia ? ?  Care Plan : [facility] Care Plan  ?Updates made by Ilean China, RN since 05/25/2021 12:00 AM  ?  ? ?Problem: Chronic Disease Management Needs   ?Priority: High  ?Onset Date: 11/30/2020  ?  ? ?Long-Range Goal: Patient Will Work with Consulting civil engineer Regarding Care Management and [place] with Anxiety state, HTN,  COPD, CHF, Dementia, CKD,HLD   ?Start Date: 11/30/2020  ?Expected End Date: 11/30/2021  ?This Visit's Progress: On track  ?Recent Progress: On track  ?Priority: High  ?Note:   ?Current Barriers:  ?Chronic Disease Management support and education needs related to Anxiety state, HTN, COPD, CHF, Dementia, CKD,HLD ? ?RNCM Clinical Goal(s):  ?Patient will continue to work with RN Care Manager and/or Social Worker to address care management and care coordination needs related to Anxiety state, HTN, COPD, CHF, Dementia, CKD,HLD  through collaboration with RN Care manager, provider, and care team.  ? ?Interventions: ?1:1 collaboration with primary care provider regarding development and update of comprehensive plan of care as evidenced by provider attestation and co-signature ?Inter-disciplinary care team collaboration (see longitudinal plan of care) ?Evaluation of current treatment plan related to  self management and patient's adherence to plan as established by provider ?Discussed family/social support ?Son is still very supportive. He assists with groceries, medications, and appointments. He comes over daily. ?Discussed importance of socialization ?Patient is no longer driving and stays at home most of the time but he lives in an apartment building and has neighbors that he talks with frequently ?Chart reviewed and discussed need for PCP visit. Last visit was 05/13/21. Patient stated that his son has taken him to [place] in [facility] but expressed that he would like to continue receiving care at [facility] and often refers people to [facility] for care. I asked him to talk with his son about this and to schedule a visit with [name], [name] if he decides to continue using [facility] as PCP. Explained that CCM services will no longer be available if he is not receiving PCP services from [facility]. Patient expressed appreciation for CCM services. RNCM to collaborate with LCSW regarding this. RNCM will f/u with patient in  1 month to see where he stands.  ? ? ?Hypertension: (Status: Goal on track: YES.) ?Last practice recorded BP readings:  ?BP Readings from Last 3 Encounters:  ?05/13/20 (!) 87/52  ?03/21/20 136/72  ?12/19/19 124/79  ? ?Encouraged patient to check and record blood pressure daily and to call PCP with any readings outside of recommended range ?Reviewed upcoming appointments ?Reviewed medications and discussed importance of compliance ?His son brings his medicines by each morning for him to take. He takes his morning medications while he's there and he has two pills that he takes on his on at bedtime.  ?Discussed diet and appetite ?Eats meals regularly and states that appetite is good ?Encouraged to eat a low sodium/DASH diet ?Discussed symptoms of hypotension and advised to call PCP with any symptoms or hypotensive readings ?Discussed fall prevention. Advised to to move carefully and change position slowly to avoid falls. ? ? ?Anxiety:  (Status: Goal on Track (progressing): YES.) Long Term Goal  ?Reviewed medications and importance of compliance ?Discussed family/social support and importance of staying socially active ?Reviewed recent telephone visit with LCSW ?Encourage patient to continue talking with LCSW regarding psychosocial concerns ?Reviewed upcoming appointments and verified transportation ?Therapeutic listening utilized regarding health related anxiety and dementia ? ? ?Falls:  (Status: Goal on Track (progressing): YES.) Long Term  Goal  ?Reviewed medications and discussed potential side effects of medications such as dizziness and frequent urination ?Advised patient of importance of notifying provider of falls ?Assessed for signs and symptoms of orthostatic hypotension ?Assessed for falls since last encounter ?Assessed social determinant of health barriers ? ?  ?Patient Goals/Self-Care Activities: ?Patient will self administer medications as prescribed as evidenced by self report/primary caregiver report   ?Patient will attend all scheduled provider appointments as evidenced by clinician review of documented attendance to scheduled appointments and patient/caregiver report ?Patient will call pharmacy for [facility]

## 2021-06-04 DIAGNOSIS — I10 Essential (primary) hypertension: Secondary | ICD-10-CM

## 2021-06-04 DIAGNOSIS — F05 Delirium due to known physiological condition: Secondary | ICD-10-CM

## 2021-06-20 ENCOUNTER — Ambulatory Visit: Payer: Medicare HMO | Admitting: *Deleted

## 2021-06-20 DIAGNOSIS — I10 Essential (primary) hypertension: Secondary | ICD-10-CM

## 2021-06-20 DIAGNOSIS — F411 Generalized anxiety disorder: Secondary | ICD-10-CM

## 2021-06-20 NOTE — Chronic Care Management (AMB) (Signed)
?   Care Management ?  ? RN Visit Note ? ?06/20/2021 ?Name: Steven [name] MRN: [id number] DOB: 1928-05-07 ? ?Subjective: ?Steven [name] is a 88 y.o. year old male who was a primary care patient of [name], [place]. The care management team was consulted for assistance with disease management and care coordination needs.   ? ?Engaged with patient by telephone for  follow-up visit and case closure . ? ?Assessment: Review of patient past medical history, allergies, medications, health status, including review of consultants reports, laboratory and other test data, was performed as part of comprehensive evaluation and provision of chronic care management services.  ? ?SDOH (Social Determinants of Health) assessments and interventions performed:   ? ?Care Plan ? ? ?Care Plan : [name] Care Plan  ?Updates made by Ilean China, RN since 06/20/2021 12:00 AM  ?  ? ?Problem: Chronic Disease Management Needs Resolved 06/20/2021  ?Priority: High  ?Onset Date: 11/30/2020  ?  ? ?Long-Range Goal: Patient Will Work with Consulting civil engineer Regarding Care Management and [place] with Anxiety state, HTN, COPD, CHF, Dementia, CKD,HLD Completed 06/20/2021  ?Start Date: 11/30/2020  ?Expected End Date: 11/30/2021  ?This Visit's Progress: Not on track  ?Recent Progress: On track  ?Priority: High  ?Note:   ?Current Barriers:  ?Chronic Disease Management support and education needs related to Anxiety state, HTN, COPD, CHF, Dementia, CKD,HLD ?Son provides transportation to appointments and has changed his PCP to [name] in [name] ?No visit at [name] within the past year ? ?RNCM Clinical Goal(s):  ?Patient will follow-up with new PCP regularly and will call them with any medical questions or care needs ? ?Interventions: ?1:1 collaboration with primary care provider regarding development and update of comprehensive plan of care as evidenced by provider attestation and co-signature ?Inter-disciplinary care team  collaboration (see longitudinal plan of care) ?Chart reviewed and discussed need for PCP visit. Last visit was 05/13/21. Patient stated that his son has taken him to [name] in [name] but expressed that he would like to continue receiving care at [name] and often refers people to [name] for care. I previously asked him to talk with his son about this and to schedule a visit with [name], [name] if he decides to continue using [name] as PCP. Explained that CCM services will no longer be available if he is not receiving PCP services from [name] Patient expressed appreciation for CCM services. Patient is currently receiving PCP services from [name] and CCM services will be discontinued at [name].  ?  ?Patient Goals/Self-Care Activities: ?Patient will schedule an appointment with [name], [name] if he decides to return to [name] for primary care services ?Patient will seek appropriate medical attention and follow-up with [name] for PCP services as recommended ? ?Plan: CCM enrollment status changed to "previously enrolled" as per patient request on 06/20/21 to discontinue enrollment. Case closed to case management services in primary care home.  ? ? ?Chong Sicilian, BSN, RN-BC ?Embedded Chronic Care Manager ?[name] ?Direct Dial: [phone]  ? ? ? ? ? ? ? ? ? ? ? ? ?

## 2021-06-20 NOTE — Patient Instructions (Addendum)
Visit Information ? ? ? ?Patient Care Plan: [facility] Care Plan  ?  ? ?Problem Identified: Chronic Disease Management Needs Resolved 06/20/2021  ?Priority: High  ?Onset Date: 11/30/2020  ?  ? ?Long-Range Goal: Patient Will Work with Consulting civil engineer Regarding Care Management and [place] with Anxiety state, HTN, COPD, CHF, Dementia, CKD,HLD Completed 06/20/2021  ?Start Date: 11/30/2020  ?Expected End Date: 11/30/2021  ?This Visit's Progress: Not on track  ?Recent Progress: On track  ?Priority: High  ?Note:   ?Current Barriers:  ?Chronic Disease Management support and education needs related to Anxiety state, HTN, COPD, CHF, Dementia, CKD,HLD ?Son provides transportation to appointments and has changed his PCP to [place] in [facility] ?No visit at [facility] within the past year ? ?RNCM Clinical Goal(s):  ?Patient will follow-up with new PCP regularly and will call them with any medical questions or care needs ? ?Interventions: ?1:1 collaboration with primary care provider regarding development and update of comprehensive plan of care as evidenced by provider attestation and co-signature ?Inter-disciplinary care team collaboration (see longitudinal plan of care) ?Chart reviewed and discussed need for PCP visit. Last visit was 05/13/21. Patient stated that his son has taken him to [place] in [facility] but expressed that he would like to continue receiving care at [facility] and often refers people to [facility] for care. I previously asked him to talk with his son about this and to schedule a visit with [name], [name] if he decides to continue using [facility] as PCP. Explained that CCM services will no longer be available if he is not receiving PCP services from [facility]. Patient expressed appreciation for CCM services. Patient is currently receiving PCP services from [place] and CCM services will be discontinued at [facility].  ?  ?Patient Goals/Self-Care Activities: ?Patient will schedule an appointment with  [name], [name] if he decides to return to [facility] for primary care services ?Patient will seek appropriate medical attention and follow-up with [name] for PCP services as recommended ?  ?  ? ?The patient verbalized understanding of instructions, educational materials, and care plan provided today and agreed to receive a mailed copy of patient instructions, educational materials, and care plan.  ? ? ?Chong Sicilian, BSN, RN-BC ?Embedded Chronic Care Manager ?[place] / [place] Management ?Direct Dial: [phone] ? ? ?

## 2021-06-21 ENCOUNTER — Telehealth: Payer: Medicare HMO

## 2021-06-22 DIAGNOSIS — E785 Hyperlipidemia, unspecified: Secondary | ICD-10-CM | POA: Diagnosis not present

## 2021-06-22 DIAGNOSIS — I10 Essential (primary) hypertension: Secondary | ICD-10-CM | POA: Diagnosis not present

## 2021-06-22 DIAGNOSIS — F419 Anxiety disorder, unspecified: Secondary | ICD-10-CM | POA: Diagnosis not present

## 2021-12-21 DIAGNOSIS — E785 Hyperlipidemia, unspecified: Secondary | ICD-10-CM | POA: Diagnosis not present

## 2021-12-21 DIAGNOSIS — F419 Anxiety disorder, unspecified: Secondary | ICD-10-CM | POA: Diagnosis not present

## 2021-12-21 DIAGNOSIS — Z23 Encounter for immunization: Secondary | ICD-10-CM | POA: Diagnosis not present

## 2021-12-21 DIAGNOSIS — I10 Essential (primary) hypertension: Secondary | ICD-10-CM | POA: Diagnosis not present

## 2022-06-26 DIAGNOSIS — E785 Hyperlipidemia, unspecified: Secondary | ICD-10-CM | POA: Diagnosis not present

## 2022-06-26 DIAGNOSIS — I10 Essential (primary) hypertension: Secondary | ICD-10-CM | POA: Diagnosis not present

## 2022-06-26 DIAGNOSIS — F419 Anxiety disorder, unspecified: Secondary | ICD-10-CM | POA: Diagnosis not present

## 2022-10-03 DIAGNOSIS — H25812 Combined forms of age-related cataract, left eye: Secondary | ICD-10-CM | POA: Diagnosis not present

## 2022-10-03 DIAGNOSIS — H25811 Combined forms of age-related cataract, right eye: Secondary | ICD-10-CM | POA: Diagnosis not present

## 2022-10-29 DIAGNOSIS — H25812 Combined forms of age-related cataract, left eye: Secondary | ICD-10-CM | POA: Diagnosis not present

## 2022-10-29 DIAGNOSIS — H25811 Combined forms of age-related cataract, right eye: Secondary | ICD-10-CM | POA: Diagnosis not present

## 2022-11-21 DIAGNOSIS — H353211 Exudative age-related macular degeneration, right eye, with active choroidal neovascularization: Secondary | ICD-10-CM | POA: Diagnosis not present

## 2022-12-26 DIAGNOSIS — H353211 Exudative age-related macular degeneration, right eye, with active choroidal neovascularization: Secondary | ICD-10-CM | POA: Diagnosis not present

## 2022-12-27 DIAGNOSIS — E785 Hyperlipidemia, unspecified: Secondary | ICD-10-CM | POA: Diagnosis not present

## 2022-12-27 DIAGNOSIS — F419 Anxiety disorder, unspecified: Secondary | ICD-10-CM | POA: Diagnosis not present

## 2022-12-27 DIAGNOSIS — I10 Essential (primary) hypertension: Secondary | ICD-10-CM | POA: Diagnosis not present

## 2023-02-08 DIAGNOSIS — H6120 Impacted cerumen, unspecified ear: Secondary | ICD-10-CM | POA: Diagnosis not present

## 2023-06-27 DIAGNOSIS — E785 Hyperlipidemia, unspecified: Secondary | ICD-10-CM | POA: Diagnosis not present

## 2023-06-27 DIAGNOSIS — F419 Anxiety disorder, unspecified: Secondary | ICD-10-CM | POA: Diagnosis not present

## 2023-06-27 DIAGNOSIS — I10 Essential (primary) hypertension: Secondary | ICD-10-CM | POA: Diagnosis not present

## 2023-08-01 DIAGNOSIS — H6120 Impacted cerumen, unspecified ear: Secondary | ICD-10-CM | POA: Diagnosis not present

## 2024-03-08 DEATH — deceased
